# Patient Record
Sex: Male | Born: 1975 | Race: White | Hispanic: No | Marital: Single | Smoking: Current every day smoker
Health system: Southern US, Community
[De-identification: ages and names within clinical notes are randomized; demographics above are authoritative.]

## PROBLEM LIST (undated history)

## (undated) DIAGNOSIS — Z992 Dependence on renal dialysis: Secondary | ICD-10-CM

## (undated) DIAGNOSIS — J449 Chronic obstructive pulmonary disease, unspecified: Secondary | ICD-10-CM

## (undated) DIAGNOSIS — E119 Type 2 diabetes mellitus without complications: Secondary | ICD-10-CM

## (undated) DIAGNOSIS — E7800 Pure hypercholesterolemia, unspecified: Secondary | ICD-10-CM

## (undated) DIAGNOSIS — I10 Essential (primary) hypertension: Secondary | ICD-10-CM

## (undated) DIAGNOSIS — N289 Disorder of kidney and ureter, unspecified: Secondary | ICD-10-CM

## (undated) DIAGNOSIS — G629 Polyneuropathy, unspecified: Secondary | ICD-10-CM

## (undated) HISTORY — PX: CHOLECYSTECTOMY: SHX55

## (undated) HISTORY — PX: NECK SURGERY: SHX720

## (undated) HISTORY — PX: BACK SURGERY: SHX140

---

## 2015-12-22 ENCOUNTER — Inpatient Hospital Stay
Admission: EM | Admit: 2015-12-22 | Discharge: 2015-12-24 | DRG: 189 | Disposition: A | Discharge status: Home / Self Care | Payer: Medicare Other | Attending: Internal Medicine | Admitting: Internal Medicine

## 2015-12-22 ENCOUNTER — Emergency Department: Payer: Medicare Other

## 2015-12-22 ENCOUNTER — Encounter: Payer: Self-pay | Admitting: Emergency Medicine

## 2015-12-22 DIAGNOSIS — I120 Hypertensive chronic kidney disease with stage 5 chronic kidney disease or end stage renal disease: Secondary | ICD-10-CM | POA: Diagnosis present

## 2015-12-22 DIAGNOSIS — E1165 Type 2 diabetes mellitus with hyperglycemia: Secondary | ICD-10-CM | POA: Diagnosis present

## 2015-12-22 DIAGNOSIS — F17200 Nicotine dependence, unspecified, uncomplicated: Secondary | ICD-10-CM | POA: Diagnosis present

## 2015-12-22 DIAGNOSIS — J9601 Acute respiratory failure with hypoxia: Secondary | ICD-10-CM | POA: Diagnosis present

## 2015-12-22 DIAGNOSIS — E1140 Type 2 diabetes mellitus with diabetic neuropathy, unspecified: Secondary | ICD-10-CM | POA: Diagnosis present

## 2015-12-22 DIAGNOSIS — E1122 Type 2 diabetes mellitus with diabetic chronic kidney disease: Secondary | ICD-10-CM | POA: Diagnosis present

## 2015-12-22 DIAGNOSIS — E7800 Pure hypercholesterolemia, unspecified: Secondary | ICD-10-CM | POA: Diagnosis present

## 2015-12-22 DIAGNOSIS — G35 Multiple sclerosis: Secondary | ICD-10-CM | POA: Diagnosis present

## 2015-12-22 DIAGNOSIS — A419 Sepsis, unspecified organism: Secondary | ICD-10-CM | POA: Diagnosis present

## 2015-12-22 DIAGNOSIS — I10 Essential (primary) hypertension: Secondary | ICD-10-CM | POA: Diagnosis present

## 2015-12-22 DIAGNOSIS — E119 Type 2 diabetes mellitus without complications: Secondary | ICD-10-CM

## 2015-12-22 DIAGNOSIS — Z992 Dependence on renal dialysis: Secondary | ICD-10-CM | POA: Diagnosis not present

## 2015-12-22 DIAGNOSIS — J810 Acute pulmonary edema: Secondary | ICD-10-CM | POA: Diagnosis present

## 2015-12-22 DIAGNOSIS — Z79899 Other long term (current) drug therapy: Secondary | ICD-10-CM | POA: Diagnosis not present

## 2015-12-22 DIAGNOSIS — N186 End stage renal disease: Secondary | ICD-10-CM | POA: Diagnosis present

## 2015-12-22 DIAGNOSIS — D631 Anemia in chronic kidney disease: Secondary | ICD-10-CM | POA: Diagnosis present

## 2015-12-22 DIAGNOSIS — E785 Hyperlipidemia, unspecified: Secondary | ICD-10-CM | POA: Diagnosis present

## 2015-12-22 DIAGNOSIS — N2581 Secondary hyperparathyroidism of renal origin: Secondary | ICD-10-CM | POA: Diagnosis present

## 2015-12-22 DIAGNOSIS — J189 Pneumonia, unspecified organism: Secondary | ICD-10-CM

## 2015-12-22 DIAGNOSIS — J449 Chronic obstructive pulmonary disease, unspecified: Secondary | ICD-10-CM | POA: Diagnosis present

## 2015-12-22 DIAGNOSIS — J811 Chronic pulmonary edema: Secondary | ICD-10-CM

## 2015-12-22 HISTORY — DX: Chronic obstructive pulmonary disease, unspecified: J44.9

## 2015-12-22 HISTORY — DX: Essential (primary) hypertension: I10

## 2015-12-22 HISTORY — DX: Disorder of kidney and ureter, unspecified: N28.9

## 2015-12-22 HISTORY — DX: Dependence on renal dialysis: Z99.2

## 2015-12-22 HISTORY — DX: Pure hypercholesterolemia, unspecified: E78.00

## 2015-12-22 HISTORY — DX: Type 2 diabetes mellitus without complications: E11.9

## 2015-12-22 HISTORY — DX: Polyneuropathy, unspecified: G62.9

## 2015-12-22 LAB — CBC WITH DIFFERENTIAL/PLATELET
BASOS ABS: 0 10*3/uL (ref 0–0.1)
Basophils Relative: 0 %
EOS ABS: 0 10*3/uL (ref 0–0.7)
HCT: 40.8 % (ref 40.0–52.0)
Hemoglobin: 12.9 g/dL — ABNORMAL LOW (ref 13.0–18.0)
LYMPHS ABS: 0.4 10*3/uL — AB (ref 1.0–3.6)
Lymphocytes Relative: 4 %
MCH: 28 pg (ref 26.0–34.0)
MCHC: 31.7 g/dL — ABNORMAL LOW (ref 32.0–36.0)
MCV: 88.5 fL (ref 80.0–100.0)
MONO ABS: 1.1 10*3/uL — AB (ref 0.2–1.0)
Monocytes Relative: 9 %
Neutro Abs: 10.6 10*3/uL — ABNORMAL HIGH (ref 1.4–6.5)
Neutrophils Relative %: 87 %
PLATELETS: 161 10*3/uL (ref 150–440)
RBC: 4.6 MIL/uL (ref 4.40–5.90)
RDW: 16.9 % — AB (ref 11.5–14.5)
WBC: 12.2 10*3/uL — AB (ref 3.8–10.6)

## 2015-12-22 LAB — BRAIN NATRIURETIC PEPTIDE: B Natriuretic Peptide: 2810 pg/mL — ABNORMAL HIGH (ref 0.0–100.0)

## 2015-12-22 LAB — COMPREHENSIVE METABOLIC PANEL
ALT: 15 U/L — AB (ref 17–63)
ANION GAP: 13 (ref 5–15)
AST: 30 U/L (ref 15–41)
Albumin: 3.4 g/dL — ABNORMAL LOW (ref 3.5–5.0)
Alkaline Phosphatase: 263 U/L — ABNORMAL HIGH (ref 38–126)
BUN: 50 mg/dL — ABNORMAL HIGH (ref 6–20)
CHLORIDE: 102 mmol/L (ref 101–111)
CO2: 23 mmol/L (ref 22–32)
Calcium: 8.2 mg/dL — ABNORMAL LOW (ref 8.9–10.3)
Creatinine, Ser: 5.38 mg/dL — ABNORMAL HIGH (ref 0.61–1.24)
GFR calc Af Amer: 14 mL/min — ABNORMAL LOW (ref >60)
GFR calc non Af Amer: 12 mL/min — ABNORMAL LOW (ref >60)
GLUCOSE: 248 mg/dL — AB (ref 65–99)
POTASSIUM: 5.3 mmol/L — AB (ref 3.5–5.1)
SODIUM: 138 mmol/L (ref 135–145)
Total Bilirubin: 1.5 mg/dL — ABNORMAL HIGH (ref 0.3–1.2)
Total Protein: 7.5 g/dL (ref 6.5–8.1)

## 2015-12-22 LAB — TROPONIN I: Troponin I: 0.07 ng/mL — ABNORMAL HIGH (ref <0.031)

## 2015-12-22 LAB — LACTIC ACID, PLASMA: LACTIC ACID, VENOUS: 1.5 mmol/L (ref 0.5–2.0)

## 2015-12-22 LAB — GLUCOSE, CAPILLARY: GLUCOSE-CAPILLARY: 296 mg/dL — AB (ref 65–99)

## 2015-12-22 MED ORDER — SODIUM CHLORIDE 0.9% FLUSH
3.0000 mL | Freq: Two times a day (BID) | INTRAVENOUS | Status: DC
  Administered 2015-12-23 – 2015-12-24 (×3): 3 mL via INTRAVENOUS

## 2015-12-22 MED ORDER — DULOXETINE HCL 30 MG PO CPEP
30.0000 mg | ORAL_CAPSULE | Freq: Every day | ORAL | Status: DC
  Administered 2015-12-23 – 2015-12-24 (×2): 30 mg via ORAL
  Filled 2015-12-22 (×2): qty 1

## 2015-12-22 MED ORDER — ACETAMINOPHEN 325 MG PO TABS
650.0000 mg | ORAL_TABLET | Freq: Four times a day (QID) | ORAL | Status: DC | PRN
Start: 2015-12-22 — End: 2015-12-24
  Administered 2015-12-23: 650 mg via ORAL
  Filled 2015-12-22: qty 2

## 2015-12-22 MED ORDER — FUROSEMIDE 10 MG/ML IJ SOLN
80.0000 mg | Freq: Once | INTRAMUSCULAR | Status: AC
  Administered 2015-12-22: 80 mg via INTRAVENOUS
  Filled 2015-12-22: qty 8

## 2015-12-22 MED ORDER — FUROSEMIDE 40 MG PO TABS
80.0000 mg | ORAL_TABLET | Freq: Two times a day (BID) | ORAL | Status: DC
  Administered 2015-12-23 – 2015-12-24 (×3): 80 mg via ORAL
  Filled 2015-12-22 (×3): qty 2

## 2015-12-22 MED ORDER — ACETAMINOPHEN 650 MG RE SUPP: 650.0000 mg | Freq: Four times a day (QID) | RECTAL | Status: DC | PRN

## 2015-12-22 MED ORDER — LISINOPRIL 20 MG PO TABS
20.0000 mg | ORAL_TABLET | Freq: Every evening | ORAL | Status: DC
  Administered 2015-12-23: 20 mg via ORAL
  Filled 2015-12-22: qty 1

## 2015-12-22 MED ORDER — CARVEDILOL 25 MG PO TABS
25.0000 mg | ORAL_TABLET | Freq: Two times a day (BID) | ORAL | Status: DC
  Administered 2015-12-23 – 2015-12-24 (×3): 25 mg via ORAL
  Filled 2015-12-22 (×3): qty 1

## 2015-12-22 MED ORDER — ATORVASTATIN CALCIUM 20 MG PO TABS
40.0000 mg | ORAL_TABLET | Freq: Every evening | ORAL | Status: DC
  Administered 2015-12-23: 40 mg via ORAL
  Filled 2015-12-22: qty 2

## 2015-12-22 MED ORDER — INSULIN ASPART 100 UNIT/ML ~~LOC~~ SOLN
0.0000 [IU] | Freq: Three times a day (TID) | SUBCUTANEOUS | Status: DC
  Administered 2015-12-23: 3 [IU] via SUBCUTANEOUS
  Administered 2015-12-23 (×2): 5 [IU] via SUBCUTANEOUS
  Administered 2015-12-24: 1 [IU] via SUBCUTANEOUS
  Filled 2015-12-22: qty 5
  Filled 2015-12-22: qty 3
  Filled 2015-12-22: qty 5
  Filled 2015-12-22: qty 1

## 2015-12-22 MED ORDER — ONDANSETRON HCL 4 MG PO TABS: 4.0000 mg | ORAL_TABLET | Freq: Four times a day (QID) | ORAL | Status: DC | PRN

## 2015-12-22 MED ORDER — HEPARIN SODIUM (PORCINE) 5000 UNIT/ML IJ SOLN
5000.0000 [IU] | Freq: Three times a day (TID) | INTRAMUSCULAR | Status: DC
  Administered 2015-12-22 – 2015-12-24 (×5): 5000 [IU] via SUBCUTANEOUS
  Filled 2015-12-22 (×5): qty 1

## 2015-12-22 MED ORDER — NITROGLYCERIN IN D5W 200-5 MCG/ML-% IV SOLN
0.0000 ug/min | INTRAVENOUS | Status: DC
  Administered 2015-12-22 – 2015-12-23 (×2): 80 ug/min via INTRAVENOUS
  Filled 2015-12-22: qty 250

## 2015-12-22 MED ORDER — AMLODIPINE BESYLATE 5 MG PO TABS
5.0000 mg | ORAL_TABLET | Freq: Every day | ORAL | Status: DC
  Administered 2015-12-23 – 2015-12-24 (×2): 5 mg via ORAL
  Filled 2015-12-22 (×2): qty 1

## 2015-12-22 MED ORDER — IPRATROPIUM-ALBUTEROL 0.5-2.5 (3) MG/3ML IN SOLN: 3.0000 mL | RESPIRATORY_TRACT | Status: DC | PRN

## 2015-12-22 MED ORDER — INSULIN ASPART 100 UNIT/ML ~~LOC~~ SOLN
0.0000 [IU] | Freq: Every day | SUBCUTANEOUS | Status: DC
  Administered 2015-12-22: 3 [IU] via SUBCUTANEOUS
  Filled 2015-12-22: qty 4
  Filled 2015-12-22: qty 3

## 2015-12-22 MED ORDER — VANCOMYCIN HCL IN DEXTROSE 1-5 GM/200ML-% IV SOLN
1000.0000 mg | Freq: Once | INTRAVENOUS | Status: AC
  Administered 2015-12-22: 1000 mg via INTRAVENOUS
  Filled 2015-12-22: qty 200

## 2015-12-22 MED ORDER — DEXTROSE 5 % IV SOLN
2.0000 g | Freq: Once | INTRAVENOUS | Status: AC
  Administered 2015-12-22: 2 g via INTRAVENOUS
  Filled 2015-12-22: qty 2

## 2015-12-22 MED ORDER — ONDANSETRON HCL 4 MG/2ML IJ SOLN
4.0000 mg | Freq: Four times a day (QID) | INTRAMUSCULAR | Status: DC | PRN
  Administered 2015-12-23: 4 mg via INTRAVENOUS
  Filled 2015-12-22: qty 2

## 2015-12-22 NOTE — ED Provider Notes (Signed)
[facility] Emergency Department Provider Note   ____________________________________________  Time seen: Seen upon arrival to the emergency department  I have reviewed the triage vital signs and the nursing notes.   HISTORY  Chief Complaint Respiratory Distress    HPI Tanner Campbell is a 40 y.o. male with a history of COPD as well as end-stage renal disease on dialysis who is presenting to the emergency department with acute onset shortness of breath. Just prior to arrival the patient became acutely short of breath. Denies any chest pain. Denies any cough or fever. En route was put on albuterol nebulization. No further interventions in route. Found to be in the mid to high 80s on room air. Does not wear oxygen at his baseline. Does smoke cigarettes. Last dialyzed yesterday.Patient does make urine.   Past Medical History  Diagnosis Date  . Diabetes mellitus without complication (HCC)   . Hypertension   . COPD (chronic obstructive pulmonary disease) (HCC)   . Renal disorder   . Neuropathy (HCC)   . Hemodialysis patient (HCC)   . High cholesterol     There are no active problems to display for this patient.   Past Surgical History  Procedure Laterality Date  . Cholecystectomy    . Back surgery    . Neck surgery      Current Outpatient Rx  Name  Route  Sig  Dispense  Refill  . albuterol (PROVENTIL HFA;VENTOLIN HFA) 108 (90 Base) MCG/ACT inhaler   Inhalation   Inhale 1-2 puffs into the lungs every 6 (six) hours as needed for wheezing or shortness of breath.         Marland Kitchen amLODipine (NORVASC) 5 MG tablet   Oral   Take 5 mg by mouth daily.         Marland Kitchen atorvastatin (LIPITOR) 40 MG tablet   Oral   Take 40 mg by mouth every evening.         . calcium acetate (PHOSLO) 667 MG capsule   Oral   Take 2,001 mg by mouth 3 (three) times daily with meals.         . carvedilol (COREG) 25 MG tablet   Oral   Take 25 mg by mouth 2 (two) times daily  with a meal.         . DULoxetine (CYMBALTA) 30 MG capsule   Oral   Take 30 mg by mouth daily.         . Ferric Citrate (AURYXIA PO)   Oral   Take by mouth.         . folic acid-vitamin b complex-vitamin c-selenium-zinc (DIALYVITE) 3 MG TABS tablet   Oral   Take 1 tablet by mouth daily.         . furosemide (LASIX) 80 MG tablet   Oral   Take 80 mg by mouth 2 (two) times daily.         Marland Kitchen gabapentin (NEURONTIN) 300 MG capsule   Oral   Take 300 mg by mouth at bedtime.         Marland Kitchen lisinopril (PRINIVIL,ZESTRIL) 20 MG tablet   Oral   Take 20 mg by mouth every evening.         . risperiDONE (RISPERDAL) 0.5 MG tablet   Oral   Take 0.5 mg by mouth at bedtime.           Allergies Penicillins  History reviewed. No pertinent family history.  Social History Social History  Substance  Use Topics  . Smoking status: Current Every Day Smoker  . Smokeless tobacco: None  . Alcohol Use: No    Review of Systems Constitutional: No fever/chills Eyes: No visual changes. ENT: No sore throat. Cardiovascular: Denies chest pain. Respiratory: As above Gastrointestinal: No abdominal pain.  No nausea, no vomiting.  No diarrhea.  No constipation. Genitourinary: Negative for dysuria. Musculoskeletal: Negative for back pain. Skin: Negative for rash. Neurological: Negative for headaches, focal weakness or numbness.  10-point ROS otherwise negative.  ____________________________________________   PHYSICAL EXAM:  VITAL SIGNS: ED Triage Vitals  Enc Vitals Group     BP 12/22/15 2053 183/100 mmHg     Pulse Rate 12/22/15 2050 122     Resp 12/22/15 2053 30     Temp 12/22/15 2123 97.9 F (36.6 C)     Temp Source 12/22/15 2053 Axillary     SpO2 12/22/15 2050 100 %     Weight 12/22/15 2053 168 lb (76.204 kg)     Height 12/22/15 2053  (1.651 m)     Head Cir --      Peak Flow --      Pain Score --      Pain Loc --      Pain Edu? --      Excl. in GC? --      Constitutional: Somnolent but are easily arousable. Eyes: Conjunctivae are normal. PERRL. EOMI. Head: Atraumatic. Nose: No congestion/rhinnorhea. Mouth/Throat: Mucous membranes are moist.  Oropharynx non-erythematous. Neck: No stridor.   Cardiovascular: Tachycardic, regular rhythm. Grossly normal heart sounds.  JVD is present. Respiratory: Wheezing throughout with prolonged history phase. Moderate respiratory distress with supraclavicular retractions. Gastrointestinal: Soft and nontender. No distention.  Musculoskeletal: No lower extremity tenderness nor edema.  No joint effusions. Neurologic:  Normal speech and language. No gross focal neurologic deficits are appreciated.  Skin:  Skin is warm, dry and intact. No rash noted. Psychiatric: Mood and affect are normal. Speech and behavior are normal.  ____________________________________________   LABS (all labs ordered are listed, but only abnormal results are displayed)  Labs Reviewed  CBC WITH DIFFERENTIAL/PLATELET - Abnormal; Notable for the following:    WBC 12.2 (*)    Hemoglobin 12.9 (*)    MCHC 31.7 (*)    RDW 16.9 (*)    Neutro Abs 10.6 (*)    Lymphs Abs 0.4 (*)    Monocytes Absolute 1.1 (*)    All other components within normal limits  COMPREHENSIVE METABOLIC PANEL - Abnormal; Notable for the following:    Potassium 5.3 (*)    Glucose, Bld 248 (*)    BUN 50 (*)    Creatinine, Ser 5.38 (*)    Calcium 8.2 (*)    Albumin 3.4 (*)    ALT 15 (*)    Alkaline Phosphatase 263 (*)    Total Bilirubin 1.5 (*)    GFR calc non Af Amer 12 (*)    GFR calc Af Amer 14 (*)    All other components within normal limits  TROPONIN I - Abnormal; Notable for the following:    Troponin I 0.07 (*)    All other components within normal limits  CULTURE, BLOOD (ROUTINE X 2)  CULTURE, BLOOD (ROUTINE X 2)  URINE CULTURE  BRAIN NATRIURETIC PEPTIDE  LACTIC ACID, PLASMA  LACTIC ACID, PLASMA  URINALYSIS COMPLETEWITH MICROSCOPIC ([facility]  ONLY)   ____________________________________________  EKG  ED ECG REPORT I, Arelia Longest, the attending physician, personally viewed and interpreted this  ECG.   Date: 12/22/2015  EKG Time: 2054  Rate: 120  Rhythm: sinus tachycardia  Axis: Right axis deviation  Intervals:none  ST&T Change: T wave inversions in V5 and V6 with 1 mm depression in V5 with a half millimeter depression in V6. Also with T wave inversion in 3 and aVF. No previous EKGs for comparison.  ____________________________________________  RADIOLOGY  DG Chest 1 View (Final result) Result time: 12/22/15 21:28:54   Final result by Rad Results In Interface (12/22/15 21:28:54)   Narrative:   CLINICAL DATA: Respiratory distress  EXAM: CHEST 1 VIEW  COMPARISON: None.  FINDINGS: Bilateral pleural effusions with underlying opacity are identified. The heart, hila, and mediastinum are unremarkable. No pneumothorax.  IMPRESSION: 1. Bilateral pleural effusions with underlying opacities. The underlying opacities could represent atelectasis but infiltrate is not excluded. Recommend follow-up to resolution.   Electronically Signed By: Gerome Sam III M.D On: 12/22/2015 21:28          ____________________________________________   PROCEDURES  CRITICAL CARE Performed by: Arelia Longest   Total critical care time: 35 minutes  Critical care time was exclusive of separately billable procedures and treating other patients.  Critical care was necessary to treat or prevent imminent or life-threatening deterioration.  Critical care was time spent personally by me on the following activities: development of treatment plan with patient and/or surrogate as well as nursing, discussions with consultants, evaluation of patient's response to treatment, examination of patient, obtaining history from patient or surrogate, ordering and performing treatments and interventions, ordering and review  of laboratory studies, ordering and review of radiographic studies, pulse oximetry and re-evaluation of patient's condition.   ____________________________________________   INITIAL IMPRESSION / ASSESSMENT AND PLAN / ED COURSE  Pertinent labs & imaging results that were available during my care of the patient were reviewed by me and considered in my medical decision making (see chart for details).  ----------------------------------------- 9:57 PM on 12/22/2015 -----------------------------------------  Patient placed on BiPAP and is tolerating it well. Also placed on nitro drip. Found to have bilateral effusions with possible infiltrate. We'll start with antibiotics. His sisters at the bedside and says he has been delirious over the past few days. Cannot rule out pneumonia as a cause for this delirium. However, the acute onset of the shortness of breath as well as his hypertension is more consistent with flash pulmonary edema.  ----------------------------------------- 10:34 PM on 12/22/2015 -----------------------------------------  Discussed case with Dr. Tora Duck who will evaluate the patient for dialysis. Signed out to Dr. Anne Hahn of the medicine service. Patient awake and alert and tolerating BiPAP well. Sepsis protocol initiated. ____________________________________________   FINAL CLINICAL IMPRESSION(S) / ED DIAGNOSES  Final diagnoses:  Flash pulmonary edema (HCC)  HCAP    NEW MEDICATIONS STARTED DURING THIS VISIT:  New Prescriptions   No medications on file     Note:  This document was prepared using Dragon voice recognition software and may include unintentional dictation errors.    Myrna Blazer, MD 12/22/15 [phone]

## 2015-12-22 NOTE — ED Notes (Signed)
Dr. Pershing Proud notified regarding troponin of 0.07. No new orders recieved

## 2015-12-22 NOTE — H&P (Signed)
[facility] -  at [facility]   PATIENT NAME: Tanner Campbell    MR#:  [id number]  DATE OF BIRTH:  12/07/1975  DATE OF ADMISSION:  12/22/2015  PRIMARY CARE PHYSICIAN: No primary care provider on file.   REQUESTING/REFERRING PHYSICIAN: Pershing Proud, MD  CHIEF COMPLAINT:   Chief Complaint  Patient presents with  . Respiratory Distress    HISTORY OF PRESENT ILLNESS:  Tanner Campbell  is a 40 y.o. male who presents with Acute onset shortness of breath at home. Patient is an end-stage renal disease patient on dialysis, and states that he was at home going about his usual activity when he became acutely dyspneic. Here in the ED he was found to have significant pulmonary edema on his chest x-ray. Some question of possible underlying pneumonia. He did also have a leukocytosis on labs with mild bandemia on differential. Patient was initially placed on BiPAP with good improvement in his O2 sats, and work of breathing. His blood pressure was significantly elevated to, and he was put on nitro drip with good result. Nephrology was contacted for the possibility of dialysis. Patient denies any recent infectious symptoms.  Hospitalists were called for admission.  PAST MEDICAL HISTORY:   Past Medical History  Diagnosis Date  . Diabetes mellitus without complication (HCC)   . Hypertension   . COPD (chronic obstructive pulmonary disease) (HCC)   . Renal disorder   . Neuropathy (HCC)   . Hemodialysis patient (HCC)   . High cholesterol     PAST SURGICAL HISTORY:   Past Surgical History  Procedure Laterality Date  . Cholecystectomy    . Back surgery    . Neck surgery      SOCIAL HISTORY:   Social History  Substance Use Topics  . Smoking status: Current Every Day Smoker  . Smokeless tobacco: Not on file  . Alcohol Use: No    FAMILY HISTORY:  History reviewed. No pertinent family history.  DRUG ALLERGIES:   Allergies  Allergen Reactions  . Penicillins  Other (See Comments)    Reaction: Unknown    MEDICATIONS AT HOME:   Prior to Admission medications   Medication Sig Start Date End Date Taking? Authorizing Provider  albuterol (PROVENTIL HFA;VENTOLIN HFA) 108 (90 Base) MCG/ACT inhaler Inhale 1-2 puffs into the lungs every 6 (six) hours as needed for wheezing or shortness of breath.   Yes Historical Provider, MD  amLODipine (NORVASC) 5 MG tablet Take 5 mg by mouth daily.   Yes Historical Provider, MD  atorvastatin (LIPITOR) 40 MG tablet Take 40 mg by mouth every evening.   Yes Historical Provider, MD  calcium acetate (PHOSLO) 667 MG capsule Take 2,001 mg by mouth 3 (three) times daily with meals.   Yes Historical Provider, MD  carvedilol (COREG) 25 MG tablet Take 25 mg by mouth 2 (two) times daily with a meal.   Yes Historical Provider, MD  DULoxetine (CYMBALTA) 30 MG capsule Take 30 mg by mouth daily.   Yes Historical Provider, MD  Ferric Citrate (AURYXIA PO) Take by mouth.   Yes Historical Provider, MD  folic acid-vitamin b complex-vitamin c-selenium-zinc (DIALYVITE) 3 MG TABS tablet Take 1 tablet by mouth daily.   Yes Historical Provider, MD  furosemide (LASIX) 80 MG tablet Take 80 mg by mouth 2 (two) times daily.   Yes Historical Provider, MD  gabapentin (NEURONTIN) 300 MG capsule Take 300 mg by mouth at bedtime.   Yes Historical Provider, MD  lisinopril (PRINIVIL,ZESTRIL) 20 MG tablet  Take 20 mg by mouth every evening.   Yes Historical Provider, MD  risperiDONE (RISPERDAL) 0.5 MG tablet Take 0.5 mg by mouth at bedtime.   Yes Historical Provider, MD    REVIEW OF SYSTEMS:  Review of Systems  Constitutional: Negative for fever, chills, weight loss and malaise/fatigue.  HENT: Negative for ear pain, hearing loss and tinnitus.   Eyes: Negative for blurred vision, double vision, pain and redness.  Respiratory: Positive for shortness of breath. Negative for cough and hemoptysis.   Cardiovascular: Negative for chest pain, palpitations,  orthopnea and leg swelling.  Gastrointestinal: Negative for nausea, vomiting, abdominal pain, diarrhea and constipation.  Genitourinary: Negative for dysuria, frequency and hematuria.  Musculoskeletal: Negative for back pain, joint pain and neck pain.  Skin:       No acne, rash, or lesions  Neurological: Negative for dizziness, tremors, focal weakness and weakness.  Endo/Heme/Allergies: Negative for polydipsia. Does not bruise/bleed easily.  Psychiatric/Behavioral: Negative for depression. The patient is not nervous/anxious and does not have insomnia.      VITAL SIGNS:   Filed Vitals:   12/22/15 2210 12/22/15 2215 12/22/15 2220 12/22/15 2225  BP:    149/81  Pulse: 110 110 109 108  Temp:      TempSrc:      Resp: Height:      Weight:      SpO2: 100% 100% 100% 98%   Wt Readings from Last 3 Encounters:  12/22/15 76.204 kg (168 lb)    PHYSICAL EXAMINATION:  Physical Exam  Vitals reviewed. Constitutional: He is oriented to person, place, and time. He appears well-developed and well-nourished. No distress.  HENT:  Head: Normocephalic and atraumatic.  Mouth/Throat: Oropharynx is clear and moist.  Eyes: Conjunctivae and EOM are normal. Pupils are equal, round, and reactive to light. No scleral icterus.  Neck: Normal range of motion. Neck supple. No JVD present. No thyromegaly present.  Cardiovascular: Regular rhythm and intact distal pulses.  Exam reveals no gallop and no friction rub.   No murmur heard. Tachycardic  Respiratory: Effort normal. No respiratory distress. He has no wheezes. He has rales.  GI: Soft. Bowel sounds are normal. He exhibits no distension. There is no tenderness.  Musculoskeletal: Normal range of motion. He exhibits no edema.  No arthritis, no gout  Lymphadenopathy:    He has no cervical adenopathy.  Neurological: He is alert and oriented to person, place, and time. No cranial nerve deficit.  No dysarthria, no aphasia  Skin: Skin is warm and  dry. No rash noted. No erythema.  Psychiatric: He has a normal mood and affect. His behavior is normal. Judgment and thought content normal.    LABORATORY PANEL:   CBC  Recent Labs Lab 12/22/15 2101  WBC 12.2*  HGB 12.9*  HCT 40.8  PLT 161   ------------------------------------------------------------------------------------------------------------------  Chemistries   Recent Labs Lab 12/22/15 2101  NA 138  K 5.3*  CL 102  CO2 23  GLUCOSE 248*  BUN 50*  CREATININE 5.38*  CALCIUM 8.2*  AST 30  ALT 15*  ALKPHOS 263*  BILITOT 1.5*   ------------------------------------------------------------------------------------------------------------------  Cardiac Enzymes  Recent Labs Lab 12/22/15 2101  TROPONINI 0.07*   ------------------------------------------------------------------------------------------------------------------  RADIOLOGY:  Dg Chest 1 View  12/22/2015  CLINICAL DATA:  Respiratory distress EXAM: CHEST 1 VIEW COMPARISON:  None. FINDINGS: Bilateral pleural effusions with underlying opacity are identified. The heart, hila, and mediastinum are unremarkable. No pneumothorax. IMPRESSION: 1. Bilateral pleural effusions with underlying  opacities. The underlying opacities could represent atelectasis but infiltrate is not excluded. Recommend follow-up to resolution. Electronically Signed   By: Gerome Sam III M.D   On: 12/22/2015 21:28    EKG:  No orders found for this or any previous visit.  IMPRESSION AND PLAN:  Principal Problem:   Sepsis (HCC) - leukocytosis with respiratory distress and tachypnea. She got with IV antibiotics in the ED, we will continue these on admission. Patient was pancultured except for sputum culture which we will add. hemodynamically stable. Active Problems:   Accelerated hypertension - chronic IV nitroglycerin in the ED with good response in his blood pressure. Continue this on admission and wean as tolerated.   Flash  pulmonary edema (HCC) - patient placed on BiPAP with good results and is breathing status, also given some IV Lasix as he still makes some urine. Nephrology consulted for possibility of dialysis.   ESRD on dialysis [facility]) - nephrology consult as above, avoid nephrotoxins, renally dose medications   Type 2 diabetes mellitus (HCC) - sliding scale insulin with corresponding glucose checks and carb modified diet   COPD (chronic obstructive pulmonary disease) (HCC) - continue home inhalers and other breathing support as above.  All the records are reviewed and case discussed with ED provider. Management plans discussed with the patient and/or family.  DVT PROPHYLAXIS: SubQ heparin  GI PROPHYLAXIS: None  ADMISSION STATUS: Inpatient  CODE STATUS: Full Code Status History    This patient does not have a recorded code status. Please follow your organizational policy for patients in this situation.      TOTAL TIME TAKING CARE OF THIS PATIENT: 45 minutes.    Shina Wass FIELDING 12/22/2015, 10:51 PM  [facility]  Office  [phone]  CC: Primary care physician; No primary care provider on file.

## 2015-12-22 NOTE — ED Notes (Signed)
Pt assisted up to bedside commode for bowel movement with rn and tech assist. Pt refuses to use bedpan.

## 2015-12-22 NOTE — ED Notes (Signed)
Pt with improved resp rate. Pt able to speak in full complete sentences through bipap mask. Family now at bedside. Call bell at right side.

## 2015-12-22 NOTE — ED Notes (Signed)
Sister: crystal ciani [phone]

## 2015-12-22 NOTE — ED Notes (Signed)
Pt resting, arouses to verbal stimuli, reports improved shob and denies pain.

## 2015-12-22 NOTE — ED Notes (Signed)
Pt in resp distress from home that started suddenly per ems. Pt with sallow skin color, resps labored. Pt received duonbeb x2 and solumedrol  iv by ems.

## 2015-12-22 NOTE — ED Notes (Signed)
Pt reports improved shob.  

## 2015-12-23 ENCOUNTER — Inpatient Hospital Stay: Admit: 2015-12-23 | Payer: Medicare Other

## 2015-12-23 LAB — BASIC METABOLIC PANEL
ANION GAP: 11 (ref 5–15)
BUN: 55 mg/dL — ABNORMAL HIGH (ref 6–20)
CALCIUM: 7.9 mg/dL — AB (ref 8.9–10.3)
CO2: 25 mmol/L (ref 22–32)
CREATININE: 5.83 mg/dL — AB (ref 0.61–1.24)
Chloride: 101 mmol/L (ref 101–111)
GFR, EST AFRICAN AMERICAN: 13 mL/min — AB (ref >60)
GFR, EST NON AFRICAN AMERICAN: 11 mL/min — AB (ref >60)
Glucose, Bld: 370 mg/dL — ABNORMAL HIGH (ref 65–99)
Potassium: 4.1 mmol/L (ref 3.5–5.1)
SODIUM: 137 mmol/L (ref 135–145)

## 2015-12-23 LAB — GLUCOSE, CAPILLARY
GLUCOSE-CAPILLARY: 323 mg/dL — AB (ref 65–99)
Glucose-Capillary: 297 mg/dL — ABNORMAL HIGH (ref 65–99)
Glucose-Capillary: 279 mg/dL — ABNORMAL HIGH (ref 65–99)
Glucose-Capillary: 271 mg/dL — ABNORMAL HIGH (ref 65–99)
Glucose-Capillary: 223 mg/dL — ABNORMAL HIGH (ref 65–99)
Glucose-Capillary: 182 mg/dL — ABNORMAL HIGH (ref 65–99)

## 2015-12-23 LAB — MRSA PCR SCREENING: MRSA by PCR: NEGATIVE

## 2015-12-23 LAB — HEMOGLOBIN A1C: HEMOGLOBIN A1C: 7.6 % — AB (ref 4.0–6.0)

## 2015-12-23 LAB — CBC
HCT: 33.6 % — ABNORMAL LOW (ref 40.0–52.0)
HEMOGLOBIN: 10.4 g/dL — AB (ref 13.0–18.0)
MCH: 27.5 pg (ref 26.0–34.0)
MCHC: 31 g/dL — ABNORMAL LOW (ref 32.0–36.0)
MCV: 88.6 fL (ref 80.0–100.0)
PLATELETS: 140 10*3/uL — AB (ref 150–440)
RBC: 3.79 MIL/uL — AB (ref 4.40–5.90)
RDW: 17 % — ABNORMAL HIGH (ref 11.5–14.5)
WBC: 12.3 10*3/uL — AB (ref 3.8–10.6)

## 2015-12-23 LAB — TROPONIN I
TROPONIN I: 0.15 ng/mL — AB (ref <0.031)
TROPONIN I: 0.15 ng/mL — AB (ref <0.031)
TROPONIN I: 0.14 ng/mL — AB (ref <0.031)

## 2015-12-23 MED ORDER — INSULIN ASPART 100 UNIT/ML ~~LOC~~ SOLN
4.0000 [IU] | Freq: Once | SUBCUTANEOUS | Status: AC
  Administered 2015-12-23: 4 [IU] via SUBCUTANEOUS

## 2015-12-23 MED ORDER — MUPIROCIN 2 % EX OINT
1.0000 "application " | TOPICAL_OINTMENT | Freq: Two times a day (BID) | CUTANEOUS | Status: DC
  Administered 2015-12-23: 1 via NASAL
  Filled 2015-12-23: qty 22

## 2015-12-23 MED ORDER — CETYLPYRIDINIUM CHLORIDE 0.05 % MT LIQD
7.0000 mL | Freq: Two times a day (BID) | OROMUCOSAL | Status: DC
  Administered 2015-12-23 – 2015-12-24 (×3): 7 mL via OROMUCOSAL

## 2015-12-23 MED ORDER — CHLORHEXIDINE GLUCONATE CLOTH 2 % EX PADS
6.0000 | MEDICATED_PAD | Freq: Every day | CUTANEOUS | Status: DC
  Administered 2015-12-23: 6 via TOPICAL

## 2015-12-23 MED ORDER — ALUM & MAG HYDROXIDE-SIMETH 200-200-20 MG/5ML PO SUSP
15.0000 mL | ORAL | Status: DC | PRN
  Administered 2015-12-23: 15 mL via ORAL
  Filled 2015-12-23: qty 30

## 2015-12-23 MED ORDER — DEXTROSE 5 % IV SOLN
500.0000 mg | Freq: Three times a day (TID) | INTRAVENOUS | Status: DC
  Administered 2015-12-23 – 2015-12-24 (×4): 500 mg via INTRAVENOUS
  Filled 2015-12-23 (×6): qty 0.5

## 2015-12-23 MED ORDER — CALCIUM ACETATE (PHOS BINDER) 667 MG PO CAPS
2001.0000 mg | ORAL_CAPSULE | Freq: Three times a day (TID) | ORAL | Status: DC
  Administered 2015-12-23 – 2015-12-24 (×3): 2001 mg via ORAL
  Filled 2015-12-23 (×3): qty 3

## 2015-12-23 MED ORDER — VANCOMYCIN HCL IN DEXTROSE 750-5 MG/150ML-% IV SOLN
750.0000 mg | INTRAVENOUS | Status: DC
  Administered 2015-12-23: 750 mg via INTRAVENOUS
  Filled 2015-12-23: qty 150

## 2015-12-23 MED ORDER — VANCOMYCIN HCL IN DEXTROSE 750-5 MG/150ML-% IV SOLN
750.0000 mg | Freq: Once | INTRAVENOUS | Status: AC
  Administered 2015-12-23: 750 mg via INTRAVENOUS
  Filled 2015-12-23: qty 150

## 2015-12-23 MED ORDER — CETYLPYRIDINIUM CHLORIDE 0.05 % MT LIQD
7.0000 mL | Freq: Two times a day (BID) | OROMUCOSAL | Status: DC
Start: 2015-12-23 — End: 2015-12-23

## 2015-12-23 MED ORDER — CHLORHEXIDINE GLUCONATE 0.12 % MT SOLN: 15.0000 mL | Freq: Two times a day (BID) | OROMUCOSAL | Status: DC

## 2015-12-23 NOTE — Progress Notes (Signed)
Pt sister called to inform nursing staff, pt has made multiple calls home insisting she come and take him home. She is refusing to do so.  Sister requesting doctor call her when able to. Dr.Kalisetti paged.  Will continue to assess.

## 2015-12-23 NOTE — ED Notes (Signed)
Pharmacy called again for vancomycin dose.

## 2015-12-23 NOTE — ED Notes (Signed)
Spoke with dr. Sheryle Hail regarding pt's hyperglycemia after sliding scale doseage at 2330. Dr. Sheryle Hail states "when he comes up to the floor we'll recheck him and redose him from his sliding scale."

## 2015-12-23 NOTE — Progress Notes (Signed)
[facility] - [place] at [facility]   PATIENT NAME: Tanner Campbell    MR#:  [id number]  DATE OF BIRTH:  [birth year]  SUBJECTIVE:  CHIEF COMPLAINT:   Chief Complaint  Patient presents with  . Respiratory Distress   -Dialysis patient, last dialysis on Thursday admitted with acute hypoxia and respiratory distress. -Chest x-ray with pulmonary edema. Due for dialysis today. -Still remains on nitro drip, blood pressure is much improved now. Also on BiPAP at 45% FiO2  REVIEW OF SYSTEMS:  Review of Systems  Constitutional: Negative for fever and chills.  HENT: Negative for ear discharge, ear pain and nosebleeds.   Eyes: Positive for blurred vision. Negative for double vision.  Respiratory: Positive for shortness of breath. Negative for cough and wheezing.   Cardiovascular: Negative for chest pain and palpitations.  Gastrointestinal: Negative for nausea, vomiting, abdominal pain, diarrhea and constipation.  Genitourinary: Negative for dysuria.  Musculoskeletal: Negative for myalgias.  Neurological: Positive for weakness. Negative for dizziness, sensory change, speech change, focal weakness, seizures and headaches.  Psychiatric/Behavioral: Negative for depression.    DRUG ALLERGIES:   Allergies  Allergen Reactions  . Penicillins Other (See Comments)    Reaction: Unknown    VITALS:  Blood pressure 119/59, pulse 95, temperature 98 F (36.7 C), temperature source Axillary, resp. rate 13, height 5\' 5"  (1.651 m), weight 73.8 kg (162 lb 11.2 oz), SpO2 97 %.  PHYSICAL EXAMINATION:  Physical Exam  GENERAL:  40 y.o.-year-old patient lying in the bed, sick -appearing. On BiPAP EYES: Pupils equal, round, reactive to light and accommodation. No scleral icterus. Extraocular muscles intact.  HEENT: Head atraumatic, normocephalic. Oropharynx and nasopharynx clear.  NECK:  Supple, no jugular venous distention. No thyroid enlargement, no tenderness.  LUNGS: Normal  breath sounds bilaterally, no wheezing, rhonchi or crepitation. Bibasilar crackles heard. No use of accessory muscles of respiration.  CARDIOVASCULAR: S1, S2 normal. No murmurs, rubs, or gallops.  ABDOMEN: Soft, nontender, nondistended. Bowel sounds present. No organomegaly or mass.  EXTREMITIES: No pedal edema, cyanosis, or clubbing.  NEUROLOGIC: Cranial nerves II through XII are intact. Muscle strength 5/5 in all extremities. Sensation intact. Gait not checked. Global weakness noted. PSYCHIATRIC: The patient is alert and oriented x 2-3.  SKIN: No obvious rash, lesion, or ulcer.    LABORATORY PANEL:   CBC  Recent Labs Lab 12/23/15 0424  WBC 12.3*  HGB 10.4*  HCT 33.6*  PLT 140*   ------------------------------------------------------------------------------------------------------------------  Chemistries   Recent Labs Lab 12/22/15 2101 12/23/15 0424  NA 138 137  K 5.3* 4.1  CL 102 101  CO2 23 25  GLUCOSE 248* 370*  BUN 50* 55*  CREATININE 5.38* 5.83*  CALCIUM 8.2* 7.9*  AST 30  --   ALT 15*  --   ALKPHOS 263*  --   BILITOT 1.5*  --    ------------------------------------------------------------------------------------------------------------------  Cardiac Enzymes  Recent Labs Lab 12/23/15 0424  TROPONINI 0.15*   ------------------------------------------------------------------------------------------------------------------  RADIOLOGY:  Dg Chest 1 View  12/22/2015  CLINICAL DATA:  Respiratory distress EXAM: CHEST 1 VIEW COMPARISON:  None. FINDINGS: Bilateral pleural effusions with underlying opacity are identified. The heart, hila, and mediastinum are unremarkable. No pneumothorax. IMPRESSION: 1. Bilateral pleural effusions with underlying opacities. The underlying opacities could represent atelectasis but infiltrate is not excluded. Recommend follow-up to resolution. Electronically Signed   By: Gerome Sam III M.D   On: 12/22/2015 21:28    EKG:  No  orders found for this or any previous visit.  ASSESSMENT AND PLAN:   40 year old male with past medical history significant for hypertension, diabetes, COPD and ESRD on hemodialysis presents with acute respiratory failure secondary to pulmonary edema.  #1 acute respiratory failure-hypoxic. Secondary to pulmonary edema. -Continue BiPAP. For dialysis today and wean off oxygen as tolerated. -Patient does use home oxygen. - ECHO pending  #2 Malignant HTN- could have triggered pulm edema - on nitroglycerin drip- restart oral meds- on norvasc, lisinopril, lasix and coreg - after HD- nitro drip can be weaned as tolerated - if needed, titrate oral meds  #3 DM-check A1c. Patient is currently nothing by mouth due to being on BiPAP. -Sugars are elevated, start sliding scale insulin. -Apparently does not take any meds for diabetes at home. Need to be verified  #4 ESRD on Tuesday Thursday Saturday hemodialysis-due for dialysis today. Nephrology consulted.  #5 DVT prophylaxis-on subcutaneous heparin   Physical therapy consult once more alert. Social worker consult     All the records are reviewed and case discussed with Care Management/Social Workerr. Management plans discussed with the patient, family and they are in agreement.  CODE STATUS: Full code  TOTAL CRITICAL CARE TIME SPENT IN TAKING CARE OF THIS PATIENT: 37 minutes.   POSSIBLE D/C IN 2-3 DAYS, DEPENDING ON CLINICAL CONDITION.   Enid Baas M.D on 12/23/2015 at 8:21 AM  Between 7am to 6pm - Pager - [phone]  After 6pm go to [email or web address] - password [email or web address] [facility]  [facility]  Office  [phone]  CC: Primary care physician; No primary care provider on file.

## 2015-12-23 NOTE — Progress Notes (Signed)
PRE DIALYSIS ASSESSMENT 

## 2015-12-23 NOTE — Progress Notes (Signed)
Pharmacy Antibiotic Note  Tanner Campbell is a 40 y.o. male admitted on 12/22/2015 with sepsis.  Pharmacy has been consulted for vancomycin and aztreonam dosing.  Plan: Vancomycin 1 gram given in ED. Additional 750 mg x1 ordered for total first dose of 1750 mg.  750 mg q dialysis will need to be ordered when HD schedule established.  Aztreonam 2 grams x1 given in ED.  500 mg q 8 hours ordered as continuation  Height:  (165.1 cm) Weight: 168 lb (76.204 kg) IBW/kg (Calculated) : 61.5  Temp (24hrs), Avg:97.9 F (36.6 C), Min:97.9 F (36.6 C), Max:97.9 F (36.6 C)   Recent Labs Lab 12/22/15 2101 12/22/15 2158  WBC 12.2*  --   CREATININE 5.38*  --   LATICACIDVEN  --  1.5    Estimated Creatinine Clearance: 17.2 mL/min (by C-G formula based on Cr of 5.38).    Allergies  Allergen Reactions  . Penicillins Other (See Comments)    Reaction: Unknown    Antimicrobials this admission: vancomycin  >>  aztreonan  >>   Dose adjustments this admission:   Microbiology results: 5/19 BCx: pending 5/19 UCx: pending  5/19 Sputum: pending    5/19 UA pending 5/19 CXR: bilateral opacities  Thank you for allowing pharmacy to be a part of this patient's care.  Kevaughn Ewing S 12/23/2015 1:08 AM

## 2015-12-23 NOTE — Progress Notes (Signed)
Pt is keeping medications in room, pt was explained that per policy medications need to be sent to pharmacy, pt refuses.

## 2015-12-23 NOTE — ED Notes (Signed)
Pharmacy called again for vancomycin dose.  

## 2015-12-23 NOTE — Progress Notes (Signed)
This note also relates to the following rows which could not be included: Pulse Rate - Cannot attach notes to unvalidated device data Resp - Cannot attach notes to unvalidated device data   DIALYSIS COMPLETED 

## 2015-12-23 NOTE — Progress Notes (Signed)
Pt transitioned to 5L [place]. Pt tolerating well. No complaints of pain, SOB, or increased work of breathing. Will continue to assess.

## 2015-12-23 NOTE — Progress Notes (Signed)
Report called to receiving nurse.  Pt transported to room 242 in stable condition. Denying pain.  All belongings with pt.

## 2015-12-23 NOTE — Progress Notes (Signed)
POST DIALYSIS ASSESSMENT 

## 2015-12-23 NOTE — Progress Notes (Signed)
Transported pt to ICU on Bipap without incident. 

## 2015-12-23 NOTE — Progress Notes (Signed)
Per Dr. Nemiah Commander okay to d/c isolation. Trudee Kuster

## 2015-12-23 NOTE — Progress Notes (Signed)
Pharmacy Antibiotic Note  Tanner Campbell is a 40 y.o. male admitted on 12/22/2015 with sepsis.  Pharmacy has been consulted for vancomycin and aztreonam dosing.  Plan: 750 mg q dialysis ordered for TTSat HD schedule. Pt in HD currently, plans are to continue on TTS. Pharmacy will continue to monitor and adjust vancomycin schedule if HD schedule changes.  Aztreonam 2 grams x1 given in ED.  500 mg q 8 hours ordered as continuation  Height: 5\' 5"  (165.1 cm) Weight: 162 lb 11.2 oz (73.8 kg) IBW/kg (Calculated) : 61.5  Temp (24hrs), Avg:98 F (36.7 C), Min:97.9 F (36.6 C), Max:98.1 F (36.7 C)   Recent Labs Lab 12/22/15 2101 12/22/15 2158 12/23/15 0424  WBC 12.2*  --  12.3*  CREATININE 5.38*  --  5.83*  LATICACIDVEN  --  1.5  --     Estimated Creatinine Clearance: 15.7 mL/min (by C-G formula based on Cr of 5.83).    Allergies  Allergen Reactions  . Penicillins Other (See Comments)    Reaction: Unknown    Antimicrobials this admission: Vancomycin  5/20 >>  Aztreonan 5/20 >>   Microbiology results: 5/19 BCx: pending 5/19 UCx: pending  5/19 Sputum: pending   5/19 UA pending 5/19 CXR: bilateral opacities  Thank you for allowing pharmacy to be a part of this patient's care.  Roque Cash, PharmD Pharmacy Resident 12/23/2015

## 2015-12-23 NOTE — Progress Notes (Signed)
This note also relates to the following rows which could not be included: Pulse Rate - Cannot attach notes to unvalidated device data Resp - Cannot attach notes to unvalidated device data BP - Cannot attach notes to unvalidated device data   BEGIN HEMODIALYSIS TREATMENT

## 2015-12-23 NOTE — Clinical Social Work Note (Signed)
Clinical Social Work Assessment  Patient Details  Name: Tanner Campbell MRN: [id number] Date of Birth: 12/31/1975  Date of referral:  12/23/15               Reason for consult:  Discharge Planning, Family Concerns                Permission sought to share information with:  Family Supports Permission granted to share information::  Yes, Verbal Permission Granted  Name::     Chrytal Ciani [phone]  Agency::  na  Relationship::  yes  Contact Information:  yes  Housing/Transportation Living arrangements for the past 2 months:  Single Family Home Source of Information:  Patient Patient Interpreter Needed:  None Criminal Activity/Legal Involvement Pertinent to Current Situation/Hospitalization:  No - Comment as needed Significant Relationships:  Siblings Lives with:    Do you feel safe going back to the place where you live?  Yes Need for family participation in patient care:  No (Coment)  Care giving concerns:  TBD-awaiting call back   Social Worker assessment / plan: LCSW met with patient he was a bit lethargic but then was awakened by nurse. Patient agreeable to do assessment. His plan is to return home with his sister. He is connected to [facility] and will go on Tues, Thurs, Saturday for Hemodialysis. He has transportation already  Arranged to dialysis clinic. He has severe diabetes which caused his kidney failure. He has recently ended a relationship with a women in [place] and is not sure if he will remain here or return to [place]. LCSW provided a community resource for DSS office and explained he should get his medicare benefits and SSDI changed over to [place]. ( information provided) No assistance required with any of his ADL's. No further needs as per patient.  Employment status:  Disabled (Comment on whether or not currently receiving Disability) (Recieved disability from [place]) Insurance information:  Medicare (From [place]) PT Recommendations:  Not assessed at this  time Information / Referral to community resources:  Other (Comment Required) (Local DSS office)  Patient/Family's Response to care: I want to get discharged and return to my sisters for now. I hope to feel better after dialysis  Patient/Family's Understanding of and Emotional Response to Diagnosis, Current Treatment, and Prognosis:  "Im OK just don't know if Im staying in [place]" Either way I understand the importance of my hemo dialysis or Im going to wind up here again  Emotional Assessment Appearance:  Appears stated age Attitude/Demeanor/Rapport:  Lethargic Affect (typically observed):  Agitated, Appropriate Orientation:  Oriented to Self, Oriented to Place, Oriented to  Time, Oriented to Situation Alcohol / Substance use:  Never Used Psych involvement (Current and /or in the community):  No (Comment)  Discharge Needs  Concerns to be addressed:  No discharge needs identified Readmission within the last 30 days:  No Current discharge risk:  None Barriers to Discharge:  Continued Medical Work up   ,  M, LCSW 12/23/2015, 1:36 PM  

## 2015-12-23 NOTE — ED Notes (Signed)
Pharmacy called for vancomycin dose.

## 2015-12-23 NOTE — Progress Notes (Signed)
LCSW met with patient and completed his assessment. He will continue with [facility] on Tues-Thursday and Saturday and return to his sisters house. He has [employer] and [place] SSDI. [employer] provided and reviewed financial ( DSS office so patient can transfer Medicare and SSDI benefits if he chooses to remain in [place]. No further needs  Mija Effertz LCSW

## 2015-12-23 NOTE — Progress Notes (Signed)
[facility]  ROUNDING NOTE   Subjective:   Admitted to ICU for flash pulmonary edema and respiratory distress. Placed on Bipap overnight. He denies missing hemodialysis treatment on Thursday  Patient now on [place] o2. Requesting breakfast.   Objective:  Vital signs in last 24 hours:  Temp:  [97.9 F (36.6 C)-98 F (36.7 C)] 98 F (36.7 C) (05/20 0315) Pulse Rate:  [95-123] 95 (05/20 0700) Resp:  [13-30] 13 (05/20 0700) BP: (119-185)/(54-102) 119/59 mmHg (05/20 0700) SpO2:  [92 %-100 %] 97 % (05/20 0700) FiO2 (%):  [100 %] 100 % (05/20 0315) Weight:  [73.8 kg (162 lb 11.2 oz)-76.204 kg (168 lb)] 73.8 kg (162 lb 11.2 oz) (05/20 0315)  Weight change:  Filed Weights   12/22/15 2053 12/23/15 0315  Weight: 76.204 kg (168 lb) 73.8 kg (162 lb 11.2 oz)    Intake/Output: I/O last 3 completed shifts: In: 317.2 [I.V.:167.2; IV Piggyback:150] Out: -    Intake/Output this shift:     Physical Exam: General: NAD, laying in bed.   Head: Normocephalic, atraumatic. Moist oral mucosal membranes  Eyes: Anicteric, PERRL  Neck: Supple, trachea midline  Lungs:  Bilateral wheezing  Heart: Regular rate and rhythm  Abdomen:  Soft, nontender  Extremities: trace peripheral edema.  Neurologic: Nonfocal, moving all four extremities  Skin: No lesions  Access: Left arm AVF    Basic Metabolic Panel:  Recent Labs Lab 12/22/15 2101 12/23/15 0424  NA 138 137  K 5.3* 4.1  CL 102 101  CO2 23 25  GLUCOSE 248* 370*  BUN 50* 55*  CREATININE 5.38* 5.83*  CALCIUM 8.2* 7.9*    Liver Function Tests:  Recent Labs Lab 12/22/15 2101  AST 30  ALT 15*  ALKPHOS 263*  BILITOT 1.5*  PROT 7.5  ALBUMIN 3.4*   No results for input(s): LIPASE, AMYLASE in the last 168 hours. No results for input(s): AMMONIA in the last 168 hours.  CBC:  Recent Labs Lab 12/22/15 2101 12/23/15 0424  WBC 12.2* 12.3*  NEUTROABS 10.6*  --   HGB 12.9* 10.4*  HCT 40.8 33.6*  MCV 88.5 88.6  PLT 161  140*    Cardiac Enzymes:  Recent Labs Lab 12/22/15 2101 12/23/15 0424  TROPONINI 0.07* 0.15*    BNP: Invalid input(s): POCBNP  CBG:  Recent Labs Lab 12/22/15 2334 12/23/15 0107 12/23/15 0818  GLUCAP 296* 323* 297*    Microbiology: Results for orders placed or performed during the hospital encounter of 12/22/15  MRSA PCR Screening     Status: None   Collection Time: 12/23/15  3:18 AM  Result Value Ref Range Status   MRSA by PCR NEGATIVE NEGATIVE Final    Comment:        The GeneXpert MRSA Assay (FDA approved for NASAL specimens only), is one component of a comprehensive MRSA colonization surveillance program. It is not intended to diagnose MRSA infection nor to guide or monitor treatment for MRSA infections.     Coagulation Studies: No results for input(s): LABPROT, INR in the last 72 hours.  Urinalysis: No results for input(s): COLORURINE, LABSPEC, PHURINE, GLUCOSEU, HGBUR, BILIRUBINUR, KETONESUR, PROTEINUR, UROBILINOGEN, NITRITE, LEUKOCYTESUR in the last 72 hours.  Invalid input(s): APPERANCEUR    Imaging: Dg Chest 1 View  12/22/2015  CLINICAL DATA:  Respiratory distress EXAM: CHEST 1 VIEW COMPARISON:  None. FINDINGS: Bilateral pleural effusions with underlying opacity are identified. The heart, hila, and mediastinum are unremarkable. No pneumothorax. IMPRESSION: 1. Bilateral pleural effusions with underlying opacities. The  underlying opacities could represent atelectasis but infiltrate is not excluded. Recommend follow-up to resolution. Electronically Signed   By: Gerome Sam III M.D   On: 12/22/2015 21:28     Medications:   . nitroGLYCERIN 80 mcg/min (12/23/15 0650)   . amLODipine  5 mg Oral Daily  . antiseptic oral rinse  7 mL Mouth Rinse q12n4p  . atorvastatin  40 mg Oral QPM  . aztreonam  500 mg Intravenous Q8H  . carvedilol  25 mg Oral BID WC  . chlorhexidine  15 mL Mouth Rinse BID  . Chlorhexidine Gluconate Cloth  6 each Topical Q0600   . DULoxetine  30 mg Oral Daily  . furosemide  80 mg Oral BID  . heparin  5,000 Units Subcutaneous Q8H  . insulin aspart  0-5 Units Subcutaneous QHS  . insulin aspart  0-9 Units Subcutaneous TID WC  . lisinopril  20 mg Oral QPM  . mupirocin ointment  1 application Nasal BID  . sodium chloride flush  3 mL Intravenous Q12H   acetaminophen **OR** acetaminophen, ipratropium-albuterol, ondansetron **OR** ondansetron (ZOFRAN) IV  Assessment/ Plan:  [name] is a 40 y.o. white male with End-stage renal disease with hemodialysis Started hemodialysis May 02, 2014, Hypertension, Diabetes mellitus type 2, hyperlipidemia, Multiple sclerosis, COPD, Back surgery, Cholecystectomy, Tobacco use   TTS [facility] left arm AVF  1. End Stage Renal Disease: with pulmonary edema. TTS  - emergent hemodialysis for today.  - Continue TTS schedule.  2. Hypertension: placed on nitro gtt. Wean off today.   3. Anemia of chronic kidney disease: hemoglobin 10.4 - hold epo due to hypertension and pulmonary edema.   4. Secondary Hyperparathyroidism: with hyperphosphatemia: outpatient phosphorus of 9.5. PTH at goal, 357. - calcium acetate    LOS: 1 Tanner Campbell 5/20/20178:40 AM

## 2015-12-23 NOTE — Progress Notes (Signed)
Patient transferred from CCU, oriented to room, fall, and safety contract reviewed. Focused assessment as charted. No complaints at this time. Skin verified with Morrie Sheldon, RN. Telemetry box verified with Ledell Noss, NT. Tanner Campbell

## 2015-12-24 ENCOUNTER — Inpatient Hospital Stay: Payer: Medicare Other

## 2015-12-24 LAB — HEPATITIS B SURFACE ANTIGEN: Hepatitis B Surface Ag: NEGATIVE

## 2015-12-24 LAB — CBC
HCT: 33.3 % — ABNORMAL LOW (ref 40.0–52.0)
HEMOGLOBIN: 10.7 g/dL — AB (ref 13.0–18.0)
MCH: 28.1 pg (ref 26.0–34.0)
MCHC: 32 g/dL (ref 32.0–36.0)
MCV: 87.7 fL (ref 80.0–100.0)
Platelets: 116 10*3/uL — ABNORMAL LOW (ref 150–440)
RBC: 3.8 MIL/uL — AB (ref 4.40–5.90)
RDW: 16.8 % — ABNORMAL HIGH (ref 11.5–14.5)
WBC: 8.9 10*3/uL (ref 3.8–10.6)

## 2015-12-24 LAB — GLUCOSE, CAPILLARY
GLUCOSE-CAPILLARY: 239 mg/dL — AB (ref 65–99)
Glucose-Capillary: 144 mg/dL — ABNORMAL HIGH (ref 65–99)

## 2015-12-24 LAB — BASIC METABOLIC PANEL
Anion gap: 9 (ref 5–15)
BUN: 45 mg/dL — AB (ref 6–20)
CHLORIDE: 99 mmol/L — AB (ref 101–111)
CO2: 30 mmol/L (ref 22–32)
Calcium: 8.4 mg/dL — ABNORMAL LOW (ref 8.9–10.3)
Creatinine, Ser: 4.23 mg/dL — ABNORMAL HIGH (ref 0.61–1.24)
GFR calc Af Amer: 19 mL/min — ABNORMAL LOW (ref >60)
GFR calc non Af Amer: 16 mL/min — ABNORMAL LOW (ref >60)
GLUCOSE: 164 mg/dL — AB (ref 65–99)
POTASSIUM: 4.7 mmol/L (ref 3.5–5.1)
Sodium: 138 mmol/L (ref 135–145)

## 2015-12-24 LAB — HEPATITIS B CORE ANTIBODY, TOTAL: Hep B Core Total Ab: NEGATIVE

## 2015-12-24 LAB — HEPATITIS B SURFACE ANTIBODY,QUALITATIVE: Hep B S Ab: REACTIVE

## 2015-12-24 NOTE — Care Management Note (Addendum)
Case Management Note  Patient Details  Name: Tanner Campbell MRN: [id number] Date of Birth: 1976-03-06  Subjective/Objective:   Discharge to home. Chronic home O2. Sister to bring portable O2 tank to [facility] for transport home today. Ivor Reining dialysis liaison was updated per D/C. [facility] on TTF.                Action/Plan:   Expected Discharge Date:                  Expected Discharge Plan:     In-House Referral:     Discharge planning Services     Post Acute Care Choice:    Choice offered to:     DME Arranged:    DME Agency:     HH Arranged:    HH Agency:     Status of Service:     Medicare Important Message Given:    Date Medicare IM Given:    Medicare IM give by:    Date Additional Medicare IM Given:    Additional Medicare Important Message give by:     If discussed at Long Length of Stay Meetings, dates discussed:    Additional Comments:  Krystian Ferrentino A, RN 12/24/2015, 10:06 AM

## 2015-12-24 NOTE — Progress Notes (Signed)
Patient d/c'd home. Education provided, no questions at this time. Patient picked up by sister with portable oxygen tank. Telemetry removed. Home medications returned. Trudee Kuster

## 2015-12-24 NOTE — Progress Notes (Signed)
Pt rerfusing to put medications in pharmacy. However is agreeably to put them in his pt bin. Per pt, his sister will come pick them up in the morning. Will defer information to dayshift RN.   Mayra Neer M

## 2015-12-24 NOTE — Progress Notes (Signed)
[facility]  ROUNDING NOTE   Subjective:   Transferred to telemetry.  Hemodialysis yesterday. Tolerated treatment well. UF of 1.5 litres.   Objective:  Vital signs in last 24 hours:  Temp:  [97.7 F (36.5 C)-98.4 F (36.9 C)] 98.4 F (36.9 C) (05/21 0437) Pulse Rate:  [75-94] 75 (05/21 0845) Resp:  [11-23] 20 (05/21 0437) BP: (105-152)/(55-121) 109/59 mmHg (05/21 0845) SpO2:  [83 %-100 %] 98 % (05/21 0437) Weight:  [75.116 kg (165 lb 9.6 oz)-75.2 kg (165 lb 12.6 oz)] 75.116 kg (165 lb 9.6 oz) (05/21 0437)  Weight change: -1.004 kg (-2 lb 3.4 oz) Filed Weights   12/23/15 0315 12/23/15 1130 12/24/15 0437  Weight: 73.8 kg (162 lb 11.2 oz) 75.2 kg (165 lb 12.6 oz) 75.116 kg (165 lb 9.6 oz)    Intake/Output: I/O last 3 completed shifts: In: 1067.8 [P.O.:240; I.V.:377.8; IV Piggyback:450] Out: 1350 [Other:1350]   Intake/Output this shift:  Total I/O In: 240 [P.O.:240] Out: -   Physical Exam: General: NAD, laying in bed.   Head: Normocephalic, atraumatic. Moist oral mucosal membranes  Eyes: Anicteric, PERRL  Neck: Supple, trachea midline  Lungs:  clear  Heart: Regular rate and rhythm  Abdomen:  Soft, nontender  Extremities: No peripheral edema.  Neurologic: Nonfocal, moving all four extremities  Skin: No lesions  Access: Left arm AVF    Basic Metabolic Panel:  Recent Labs Lab 12/22/15 2101 12/23/15 0424 12/24/15 0452  NA 138 137 138  K 5.3* 4.1 4.7  CL 102 101 99*  CO2 23 25 30   GLUCOSE 248* 370* 164*  BUN 50* 55* 45*  CREATININE 5.38* 5.83* 4.23*  CALCIUM 8.2* 7.9* 8.4*    Liver Function Tests:  Recent Labs Lab 12/22/15 2101  AST 30  ALT 15*  ALKPHOS 263*  BILITOT 1.5*  PROT 7.5  ALBUMIN 3.4*   No results for input(s): LIPASE, AMYLASE in the last 168 hours. No results for input(s): AMMONIA in the last 168 hours.  CBC:  Recent Labs Lab 12/22/15 2101 12/23/15 0424 12/24/15 0452  WBC 12.2* 12.3* 8.9  NEUTROABS 10.6*  --    --   HGB 12.9* 10.4* 10.7*  HCT 40.8 33.6* 33.3*  MCV 88.5 88.6 87.7  PLT 161 140* 116*    Cardiac Enzymes:  Recent Labs Lab 12/22/15 2101 12/23/15 0424 12/23/15 0916 12/23/15 1438  TROPONINI 0.07* 0.15* 0.15* 0.14*    BNP: Invalid input(s): POCBNP  CBG:  Recent Labs Lab 12/23/15 1048 12/23/15 1144 12/23/15 1621 12/23/15 2051 12/24/15 0727  GLUCAP 279* 271* 223* 182* 144*    Microbiology: Results for orders placed or performed during the hospital encounter of 12/22/15  Blood Culture (routine x 2)     Status: None (Preliminary result)   Collection Time: 12/22/15  9:58 PM  Result Value Ref Range Status   Specimen Description BLOOD RIGHT ARM  Final   Special Requests BOTTLES DRAWN AEROBIC AND ANAEROBIC 7CC  Final   Culture NO GROWTH < 24 HOURS  Final   Report Status PENDING  Incomplete  Blood Culture (routine x 2)     Status: None (Preliminary result)   Collection Time: 12/22/15 10:18 PM  Result Value Ref Range Status   Specimen Description BLOOD RIGHT HAND  Final   Special Requests BOTTLES DRAWN AEROBIC AND ANAEROBIC 8CC  Final   Culture NO GROWTH < 24 HOURS  Final   Report Status PENDING  Incomplete  MRSA PCR Screening     Status: None  Collection Time: 12/23/15  3:18 AM  Result Value Ref Range Status   MRSA by PCR NEGATIVE NEGATIVE Final    Comment:        The GeneXpert MRSA Assay (FDA approved for NASAL specimens only), is one component of a comprehensive MRSA colonization surveillance program. It is not intended to diagnose MRSA infection nor to guide or monitor treatment for MRSA infections.     Coagulation Studies: No results for input(s): LABPROT, INR in the last 72 hours.  Urinalysis: No results for input(s): COLORURINE, LABSPEC, PHURINE, GLUCOSEU, HGBUR, BILIRUBINUR, KETONESUR, PROTEINUR, UROBILINOGEN, NITRITE, LEUKOCYTESUR in the last 72 hours.  Invalid input(s): APPERANCEUR    Imaging: Dg Chest 1 View  12/22/2015  CLINICAL DATA:   Respiratory distress EXAM: CHEST 1 VIEW COMPARISON:  None. FINDINGS: Bilateral pleural effusions with underlying opacity are identified. The heart, hila, and mediastinum are unremarkable. No pneumothorax. IMPRESSION: 1. Bilateral pleural effusions with underlying opacities. The underlying opacities could represent atelectasis but infiltrate is not excluded. Recommend follow-up to resolution. Electronically Signed   By: Gerome Sam III M.D   On: 12/22/2015 21:28     Medications:   . nitroGLYCERIN Stopped (12/23/15 1600)   . amLODipine  5 mg Oral Daily  . antiseptic oral rinse  7 mL Mouth Rinse BID  . atorvastatin  40 mg Oral QPM  . aztreonam  500 mg Intravenous Q8H  . calcium acetate  2,001 mg Oral TID WC  . carvedilol  25 mg Oral BID WC  . Chlorhexidine Gluconate Cloth  6 each Topical Q0600  . DULoxetine  30 mg Oral Daily  . furosemide  80 mg Oral BID  . heparin  5,000 Units Subcutaneous Q8H  . insulin aspart  0-5 Units Subcutaneous QHS  . insulin aspart  0-9 Units Subcutaneous TID WC  . lisinopril  20 mg Oral QPM  . mupirocin ointment  1 application Nasal BID  . sodium chloride flush  3 mL Intravenous Q12H  . vancomycin  750 mg Intravenous Q T,Th,Sa-HD   acetaminophen **OR** acetaminophen, alum & mag hydroxide-simeth, ipratropium-albuterol, ondansetron **OR** ondansetron (ZOFRAN) IV  Assessment/ Plan:  [name] is a 40 y.o. white male with End-stage renal disease with hemodialysis Started hemodialysis May 02, 2014, Hypertension, Diabetes mellitus type 2, hyperlipidemia, Multiple sclerosis, COPD, Back surgery, Cholecystectomy, Tobacco use   TTS [facility]. left arm AVF  1. End Stage Renal Disease: with pulmonary edema. TTS  - emergent hemodialysis yesterday. - Continue TTS schedule. Next treatment for Tuesday  2. Hypertension: placed on nitro gtt. Weaned off now.  - home regomein of amlodipine, carvedilol, furosemide and lisinopril.   3. Anemia of  chronic kidney disease:  - held epo due to hypertension and pulmonary edema.   4. Secondary Hyperparathyroidism: with hyperphosphatemia: outpatient phosphorus of 9.5. PTH at goal, 357. - calcium acetate with meals.      LOS: 2 Tanner Campbell 5/21/201710:30 AM

## 2015-12-24 NOTE — Discharge Summary (Signed)
[facility] at [facility]   PATIENT NAME: Tanner Campbell    MR#:  [id number]  DATE OF BIRTH:  [birth year]  DATE OF ADMISSION:  12/22/2015 ADMITTING PHYSICIAN: Oralia Manis, MD  DATE OF DISCHARGE: 12/24/2015  PRIMARY CARE PHYSICIAN: No primary care provider on file.    ADMISSION DIAGNOSIS:  Flash pulmonary edema (HCC) [J81.0] HCAP (healthcare-associated pneumonia) [J18.9]  DISCHARGE DIAGNOSIS:  Principal Problem:   Sepsis (HCC) Active Problems:   Accelerated hypertension   Flash pulmonary edema (HCC)   ESRD on dialysis (HCC)   Type 2 diabetes mellitus (HCC)   COPD (chronic obstructive pulmonary disease) (HCC)   SECONDARY DIAGNOSIS:   Past Medical History  Diagnosis Date  . Diabetes mellitus without complication (HCC)   . Hypertension   . COPD (chronic obstructive pulmonary disease) (HCC)   . Renal disorder   . Neuropathy (HCC)   . Hemodialysis patient (HCC)   . High cholesterol     HOSPITAL COURSE:   40 year old male with past medical history significant for hypertension, diabetes, COPD and ESRD on hemodialysis presents with acute respiratory failure secondary to pulmonary edema.  #1 Acute respiratory failure-hypoxic. Secondary to pulmonary edema. Resolved now. -Off BiPAP. Back to 2-3 L oxygen via nasal cannula which is his home oxygen - ECHO pending likely has diastolic dysfunction. -Uses Lasix 80 mg twice a day at home which will be continued -Repeat chest x-ray with mild improvement  #2 Malignant HTN- could have triggered pulm edema - off nitroglycerin drip- restarted home oral meds- on norvasc, lisinopril, lasix and coreg -Advised to be compliant with his medications. Blood pressure is much improved today.  #3 diabetes mellitus- A1c is 7.6.  -Apparently does not take any meds for diabetes at home.  -Sugars are borderline elevated. Follow-up with PCP  #4 ESRD on Tuesday Thursday Saturday hemodialysis- -appreciate  nephrology consult. Had dialysis done yesterday. Next dialysis on Tuesday per schedule. Patient is very eager to go home today. He acknowledged that he need to take his blood pressure medicines otherwise might end up in the hospital again.  Discharge today  DISCHARGE CONDITIONS:   Guarded  CONSULTS OBTAINED:  Treatment Team:  Lamont Dowdy, MD  DRUG ALLERGIES:   Allergies  Allergen Reactions  . Penicillins Other (See Comments)    Reaction: Unknown    DISCHARGE MEDICATIONS:   Current Discharge Medication List    CONTINUE these medications which have NOT CHANGED   Details  albuterol (PROVENTIL HFA;VENTOLIN HFA) 108 (90 Base) MCG/ACT inhaler Inhale 1-2 puffs into the lungs every 6 (six) hours as needed for wheezing or shortness of breath.    amLODipine (NORVASC) 5 MG tablet Take 5 mg by mouth daily.    atorvastatin (LIPITOR) 40 MG tablet Take 40 mg by mouth every evening.    calcium acetate (PHOSLO) 667 MG capsule Take 2,001 mg by mouth 3 (three) times daily with meals.    carvedilol (COREG) 25 MG tablet Take 25 mg by mouth 2 (two) times daily with a meal.    DULoxetine (CYMBALTA) 30 MG capsule Take 30 mg by mouth daily.    Ferric Citrate (AURYXIA PO) Take by mouth.    folic acid-vitamin b complex-vitamin c-selenium-zinc (DIALYVITE) 3 MG TABS tablet Take 1 tablet by mouth daily.    furosemide (LASIX) 80 MG tablet Take 80 mg by mouth 2 (two) times daily.    gabapentin (NEURONTIN) 300 MG capsule Take 300 mg by mouth at bedtime.    lisinopril (PRINIVIL,ZESTRIL) 20  MG tablet Take 20 mg by mouth every evening.    risperiDONE (RISPERDAL) 0.5 MG tablet Take 0.5 mg by mouth at bedtime.         DISCHARGE INSTRUCTIONS:   1. Follow up for dialysis in 2 days on Tuesday 2. Advised to be complaint with medications 3. Continue 2-3 L oxygen at discharge which is his home oxygen through [employer]   If you experience worsening of your admission symptoms, develop shortness of  breath, life threatening emergency, suicidal or homicidal thoughts you must seek medical attention immediately by calling 911 or calling your MD immediately  if symptoms less severe.  You Must read complete instructions/literature along with all the possible adverse reactions/side effects for all the Medicines you take and that have been prescribed to you. Take any new Medicines after you have completely understood and accept all the possible adverse reactions/side effects.   Please note  You were cared for by a hospitalist during your hospital stay. If you have any questions about your discharge medications or the care you received while you were in the hospital after you are discharged, you can call the unit and asked to speak with the hospitalist on call if the hospitalist that took care of you is not available. Once you are discharged, your primary care physician will handle any further medical issues. Please note that NO REFILLS for any discharge medications will be authorized once you are discharged, as it is imperative that you return to your primary care physician (or establish a relationship with a primary care physician if you do not have one) for your aftercare needs so that they can reassess your need for medications and monitor your lab values.    Today   CHIEF COMPLAINT:   Chief Complaint  Patient presents with  . Respiratory Distress    VITAL SIGNS:  Blood pressure 109/59, pulse 75, temperature 98.4 F (36.9 C), temperature source Oral, resp. rate 20, height 5\' 5"  (1.651 m), weight 75.116 kg (165 lb 9.6 oz), SpO2 98 %.  I/O:   Intake/Output Summary (Last 24 hours) at 12/24/15 0941 Last data filed at 12/24/15 0651  Gross per 24 hour  Intake 654.57 ml  Output   1350 ml  Net -695.43 ml    PHYSICAL EXAMINATION:   Physical Exam  GENERAL:  40 y.o.-year-old patient sitting in the bed with no acute distress.  EYES: Pupils equal, round, reactive to light and accommodation.  No scleral icterus. Extraocular muscles intact.  HEENT: Head atraumatic, normocephalic. Oropharynx and nasopharynx clear.  NECK:  Supple, no jugular venous distention. No thyroid enlargement, no tenderness.  LUNGS: Normal breath sounds bilaterally, no wheezing, rales,rhonchi or crepitation. No use of accessory muscles of respiration. Fine bibasilar crackles heard. CARDIOVASCULAR: S1, S2 normal. No rubs, or gallops. 3/6 systolic murmur present ABDOMEN: Soft, non-tender, non-distended. Bowel sounds present. No organomegaly or mass.  EXTREMITIES: No pedal edema, cyanosis, or clubbing. Left arm fistula present NEUROLOGIC: Cranial nerves II through XII are intact. Muscle strength 5/5 in all extremities. Sensation intact. Gait not checked.  PSYCHIATRIC: The patient is alert and oriented x 3.  SKIN: No obvious rash, lesion, or ulcer.   DATA REVIEW:   CBC  Recent Labs Lab 12/24/15 0452  WBC 8.9  HGB 10.7*  HCT 33.3*  PLT 116*    Chemistries   Recent Labs Lab 12/22/15 2101  12/24/15 0452  NA 138  < > 138  K 5.3*  < > 4.7  CL 102  < >  99*  CO2 23  < > 30  GLUCOSE 248*  < > 164*  BUN 50*  < > 45*  CREATININE 5.38*  < > 4.23*  CALCIUM 8.2*  < > 8.4*  AST 30  --   --   ALT 15*  --   --   ALKPHOS 263*  --   --   BILITOT 1.5*  --   --   < > = values in this interval not displayed.  Cardiac Enzymes  Recent Labs Lab 12/23/15 1438  TROPONINI 0.14*    Microbiology Results  Results for orders placed or performed during the hospital encounter of 12/22/15  Blood Culture (routine x 2)     Status: None (Preliminary result)   Collection Time: 12/22/15  9:58 PM  Result Value Ref Range Status   Specimen Description BLOOD RIGHT ARM  Final   Special Requests BOTTLES DRAWN AEROBIC AND ANAEROBIC 7CC  Final   Culture NO GROWTH < 24 HOURS  Final   Report Status PENDING  Incomplete  Blood Culture (routine x 2)     Status: None (Preliminary result)   Collection Time: 12/22/15 10:18 PM   Result Value Ref Range Status   Specimen Description BLOOD RIGHT HAND  Final   Special Requests BOTTLES DRAWN AEROBIC AND ANAEROBIC 8CC  Final   Culture NO GROWTH < 24 HOURS  Final   Report Status PENDING  Incomplete  MRSA PCR Screening     Status: None   Collection Time: 12/23/15  3:18 AM  Result Value Ref Range Status   MRSA by PCR NEGATIVE NEGATIVE Final    Comment:        The GeneXpert MRSA Assay (FDA approved for NASAL specimens only), is one component of a comprehensive MRSA colonization surveillance program. It is not intended to diagnose MRSA infection nor to guide or monitor treatment for MRSA infections.     RADIOLOGY:  Dg Chest 1 View  12/22/2015  CLINICAL DATA:  Respiratory distress EXAM: CHEST 1 VIEW COMPARISON:  None. FINDINGS: Bilateral pleural effusions with underlying opacity are identified. The heart, hila, and mediastinum are unremarkable. No pneumothorax. IMPRESSION: 1. Bilateral pleural effusions with underlying opacities. The underlying opacities could represent atelectasis but infiltrate is not excluded. Recommend follow-up to resolution. Electronically Signed   By: Gerome Sam III M.D   On: 12/22/2015 21:28    EKG:  No orders found for this or any previous visit.    Management plans discussed with the patient, family and they are in agreement.  CODE STATUS:     Code Status Orders        Start     Ordered   12/22/15 2324  Full code   Continuous     12/22/15 2323    Code Status History    Date Active Date Inactive Code Status Order ID Comments User Context   This patient has a current code status but no historical code status.      TOTAL TIME TAKING CARE OF THIS PATIENT: 37 minutes.    Enid Baas M.D on 12/24/2015 at 9:41 AM  Between 7am to 6pm - Pager - [phone]  After 6pm go to [email or web address] - password [email or web address] [facility]  [facility]  Office  [phone]  CC: Primary care physician; No primary care  provider on file.

## 2015-12-26 LAB — GLUCOSE, CAPILLARY: GLUCOSE-CAPILLARY: 341 mg/dL — AB (ref 65–99)

## 2015-12-27 LAB — CULTURE, BLOOD (ROUTINE X 2)
Culture: NO GROWTH
Culture: NO GROWTH

## 2016-01-24 ENCOUNTER — Other Ambulatory Visit: Payer: Self-pay

## 2016-01-24 ENCOUNTER — Inpatient Hospital Stay
Admission: EM | Admit: 2016-01-24 | Discharge: 2016-02-01 | DRG: 207 | Disposition: A | Discharge status: Other Acute Inpatient Hospital | Payer: Medicare Other | Attending: Internal Medicine | Admitting: Internal Medicine

## 2016-01-24 ENCOUNTER — Emergency Department: Payer: Medicare Other

## 2016-01-24 ENCOUNTER — Encounter: Payer: Self-pay | Admitting: Emergency Medicine

## 2016-01-24 DIAGNOSIS — J189 Pneumonia, unspecified organism: Secondary | ICD-10-CM | POA: Diagnosis not present

## 2016-01-24 DIAGNOSIS — J9621 Acute and chronic respiratory failure with hypoxia: Secondary | ICD-10-CM | POA: Diagnosis not present

## 2016-01-24 DIAGNOSIS — K567 Ileus, unspecified: Secondary | ICD-10-CM | POA: Diagnosis not present

## 2016-01-24 DIAGNOSIS — J9622 Acute and chronic respiratory failure with hypercapnia: Secondary | ICD-10-CM | POA: Diagnosis present

## 2016-01-24 DIAGNOSIS — E875 Hyperkalemia: Secondary | ICD-10-CM | POA: Diagnosis present

## 2016-01-24 DIAGNOSIS — Z4659 Encounter for fitting and adjustment of other gastrointestinal appliance and device: Secondary | ICD-10-CM

## 2016-01-24 DIAGNOSIS — R0902 Hypoxemia: Secondary | ICD-10-CM | POA: Diagnosis present

## 2016-01-24 DIAGNOSIS — J9600 Acute respiratory failure, unspecified whether with hypoxia or hypercapnia: Secondary | ICD-10-CM

## 2016-01-24 DIAGNOSIS — R197 Diarrhea, unspecified: Secondary | ICD-10-CM | POA: Diagnosis not present

## 2016-01-24 DIAGNOSIS — E1122 Type 2 diabetes mellitus with diabetic chronic kidney disease: Secondary | ICD-10-CM | POA: Diagnosis present

## 2016-01-24 DIAGNOSIS — E7800 Pure hypercholesterolemia, unspecified: Secondary | ICD-10-CM | POA: Diagnosis present

## 2016-01-24 DIAGNOSIS — R0600 Dyspnea, unspecified: Secondary | ICD-10-CM

## 2016-01-24 DIAGNOSIS — N186 End stage renal disease: Secondary | ICD-10-CM | POA: Diagnosis present

## 2016-01-24 DIAGNOSIS — Y844 Aspiration of fluid as the cause of abnormal reaction of the patient, or of later complication, without mention of misadventure at the time of the procedure: Secondary | ICD-10-CM | POA: Diagnosis not present

## 2016-01-24 DIAGNOSIS — J9620 Acute and chronic respiratory failure, unspecified whether with hypoxia or hypercapnia: Secondary | ICD-10-CM

## 2016-01-24 DIAGNOSIS — Z9981 Dependence on supplemental oxygen: Secondary | ICD-10-CM

## 2016-01-24 DIAGNOSIS — J811 Chronic pulmonary edema: Secondary | ICD-10-CM | POA: Diagnosis present

## 2016-01-24 DIAGNOSIS — Z880 Allergy status to penicillin: Secondary | ICD-10-CM

## 2016-01-24 DIAGNOSIS — G35 Multiple sclerosis: Secondary | ICD-10-CM | POA: Diagnosis present

## 2016-01-24 DIAGNOSIS — J90 Pleural effusion, not elsewhere classified: Secondary | ICD-10-CM | POA: Diagnosis present

## 2016-01-24 DIAGNOSIS — Z79899 Other long term (current) drug therapy: Secondary | ICD-10-CM

## 2016-01-24 DIAGNOSIS — N179 Acute kidney failure, unspecified: Secondary | ICD-10-CM | POA: Diagnosis not present

## 2016-01-24 DIAGNOSIS — Z9889 Other specified postprocedural states: Secondary | ICD-10-CM | POA: Insufficient documentation

## 2016-01-24 DIAGNOSIS — Y92239 Unspecified place in hospital as the place of occurrence of the external cause: Secondary | ICD-10-CM | POA: Diagnosis not present

## 2016-01-24 DIAGNOSIS — I120 Hypertensive chronic kidney disease with stage 5 chronic kidney disease or end stage renal disease: Secondary | ICD-10-CM | POA: Diagnosis present

## 2016-01-24 DIAGNOSIS — B963 Hemophilus influenzae [H. influenzae] as the cause of diseases classified elsewhere: Secondary | ICD-10-CM | POA: Diagnosis present

## 2016-01-24 DIAGNOSIS — J441 Chronic obstructive pulmonary disease with (acute) exacerbation: Secondary | ICD-10-CM | POA: Diagnosis present

## 2016-01-24 DIAGNOSIS — S21109A Unspecified open wound of unspecified front wall of thorax without penetration into thoracic cavity, initial encounter: Secondary | ICD-10-CM | POA: Insufficient documentation

## 2016-01-24 DIAGNOSIS — D631 Anemia in chronic kidney disease: Secondary | ICD-10-CM | POA: Diagnosis present

## 2016-01-24 DIAGNOSIS — R1110 Vomiting, unspecified: Secondary | ICD-10-CM

## 2016-01-24 DIAGNOSIS — E1140 Type 2 diabetes mellitus with diabetic neuropathy, unspecified: Secondary | ICD-10-CM | POA: Diagnosis present

## 2016-01-24 DIAGNOSIS — R140 Abdominal distension (gaseous): Secondary | ICD-10-CM

## 2016-01-24 DIAGNOSIS — D696 Thrombocytopenia, unspecified: Secondary | ICD-10-CM | POA: Diagnosis present

## 2016-01-24 DIAGNOSIS — E872 Acidosis: Secondary | ICD-10-CM | POA: Diagnosis present

## 2016-01-24 DIAGNOSIS — N189 Chronic kidney disease, unspecified: Secondary | ICD-10-CM

## 2016-01-24 DIAGNOSIS — L8990 Pressure ulcer of unspecified site, unspecified stage: Secondary | ICD-10-CM | POA: Insufficient documentation

## 2016-01-24 DIAGNOSIS — Z9911 Dependence on respirator [ventilator] status: Secondary | ICD-10-CM

## 2016-01-24 DIAGNOSIS — J440 Chronic obstructive pulmonary disease with acute lower respiratory infection: Secondary | ICD-10-CM | POA: Diagnosis present

## 2016-01-24 DIAGNOSIS — J9601 Acute respiratory failure with hypoxia: Secondary | ICD-10-CM

## 2016-01-24 DIAGNOSIS — E876 Hypokalemia: Secondary | ICD-10-CM | POA: Diagnosis not present

## 2016-01-24 DIAGNOSIS — E785 Hyperlipidemia, unspecified: Secondary | ICD-10-CM | POA: Diagnosis present

## 2016-01-24 DIAGNOSIS — L7622 Postprocedural hemorrhage and hematoma of skin and subcutaneous tissue following other procedure: Secondary | ICD-10-CM | POA: Diagnosis not present

## 2016-01-24 DIAGNOSIS — N2581 Secondary hyperparathyroidism of renal origin: Secondary | ICD-10-CM | POA: Diagnosis present

## 2016-01-24 DIAGNOSIS — Z9115 Patient's noncompliance with renal dialysis: Secondary | ICD-10-CM

## 2016-01-24 DIAGNOSIS — Z992 Dependence on renal dialysis: Secondary | ICD-10-CM

## 2016-01-24 DIAGNOSIS — G9340 Encephalopathy, unspecified: Secondary | ICD-10-CM | POA: Diagnosis present

## 2016-01-24 DIAGNOSIS — F17200 Nicotine dependence, unspecified, uncomplicated: Secondary | ICD-10-CM | POA: Diagnosis present

## 2016-01-24 LAB — CBC
HCT: 33.1 % — ABNORMAL LOW (ref 40.0–52.0)
Hemoglobin: 10.9 g/dL — ABNORMAL LOW (ref 13.0–18.0)
MCH: 27.8 pg (ref 26.0–34.0)
MCHC: 33 g/dL (ref 32.0–36.0)
MCV: 84.3 fL (ref 80.0–100.0)
PLATELETS: 210 10*3/uL (ref 150–440)
RBC: 3.92 MIL/uL — AB (ref 4.40–5.90)
RDW: 17.2 % — AB (ref 11.5–14.5)
WBC: 10.5 10*3/uL (ref 3.8–10.6)

## 2016-01-24 LAB — BASIC METABOLIC PANEL
Anion gap: 18 — ABNORMAL HIGH (ref 5–15)
BUN: 106 mg/dL — AB (ref 6–20)
CALCIUM: 8.5 mg/dL — AB (ref 8.9–10.3)
CHLORIDE: 99 mmol/L — AB (ref 101–111)
CO2: 19 mmol/L — ABNORMAL LOW (ref 22–32)
CREATININE: 8.96 mg/dL — AB (ref 0.61–1.24)
GFR calc non Af Amer: 6 mL/min — ABNORMAL LOW (ref >60)
GFR, EST AFRICAN AMERICAN: 8 mL/min — AB (ref >60)
Glucose, Bld: 270 mg/dL — ABNORMAL HIGH (ref 65–99)
Potassium: 6.6 mmol/L (ref 3.5–5.1)
SODIUM: 136 mmol/L (ref 135–145)

## 2016-01-24 LAB — BRAIN NATRIURETIC PEPTIDE: B Natriuretic Peptide: 3282 pg/mL — ABNORMAL HIGH (ref 0.0–100.0)

## 2016-01-24 LAB — TROPONIN I: TROPONIN I: 0.04 ng/mL — AB (ref <0.031)

## 2016-01-24 MED ORDER — METHYLPREDNISOLONE SODIUM SUCC 125 MG IJ SOLR
125.0000 mg | Freq: Once | INTRAMUSCULAR | Status: AC
  Administered 2016-01-24: 125 mg via INTRAVENOUS

## 2016-01-24 MED ORDER — IPRATROPIUM-ALBUTEROL 0.5-2.5 (3) MG/3ML IN SOLN
RESPIRATORY_TRACT | Status: AC
  Administered 2016-01-24: 3 mL via RESPIRATORY_TRACT
  Filled 2016-01-24: qty 6

## 2016-01-24 MED ORDER — CALCIUM GLUCONATE 10 % IV SOLN
1.0000 g | Freq: Once | INTRAVENOUS | Status: AC
  Administered 2016-01-24: 1 g via INTRAVENOUS
  Filled 2016-01-24: qty 10

## 2016-01-24 MED ORDER — DEXTROSE 50 % IV SOLN
1.0000 | Freq: Once | INTRAVENOUS | Status: AC
  Administered 2016-01-24: 50 mL via INTRAVENOUS
  Filled 2016-01-24: qty 50

## 2016-01-24 MED ORDER — METHYLPREDNISOLONE SODIUM SUCC 125 MG IJ SOLR
INTRAMUSCULAR | Status: AC
  Administered 2016-01-24: 125 mg via INTRAVENOUS
  Filled 2016-01-24: qty 2

## 2016-01-24 MED ORDER — INSULIN ASPART 100 UNIT/ML ~~LOC~~ SOLN
10.0000 [IU] | Freq: Once | SUBCUTANEOUS | Status: AC
  Administered 2016-01-24: 10 [IU] via INTRAVENOUS
  Filled 2016-01-24: qty 10

## 2016-01-24 MED ORDER — SODIUM POLYSTYRENE SULFONATE 15 GM/60ML PO SUSP: 30.0000 g | Freq: Once | ORAL | Status: DC

## 2016-01-24 MED ORDER — IPRATROPIUM-ALBUTEROL 0.5-2.5 (3) MG/3ML IN SOLN
3.0000 mL | Freq: Once | RESPIRATORY_TRACT | Status: AC
  Administered 2016-01-24: 3 mL via RESPIRATORY_TRACT

## 2016-01-24 MED ORDER — SODIUM BICARBONATE 8.4 % IV SOLN
50.0000 meq | Freq: Once | INTRAVENOUS | Status: AC
  Administered 2016-01-24: 50 meq via INTRAVENOUS
  Filled 2016-01-24: qty 50

## 2016-01-24 NOTE — ED Provider Notes (Signed)
[facility] Emergency Department Provider Note   ____________________________________________  Time seen:  I have reviewed the triage vital signs and the triage nursing note.  HISTORY  Chief Complaint Shortness of Breath   Historian EMS report Sister at bedside Patient is limited himself as a bit of a poor historian  HPI Tanner Campbell is a 40 y.o. male with a history of diabetes, hypertension, COPD on 5 L home O2, as well as chronic renal failure on hemodialysis Tuesday, Thursday, and Saturdays, is here for worsening shortness of breath for 1-2 days. Patient is a little confused and the history is given by the sister at bedside who cares for him. He had dialysis on Thursday and they state too much fluid was pulled off and so he was feeling bad and decided not to go for dialysis on Saturday. On Tuesday his transportation was unable to get him to dialysis and he started having increased trouble breathing. Denies fevers. Denies productive sputum.  He was admitted in the hospital about a month ago with similar symptoms where he was treated for COPD exacerbation, possible pneumonia sepsis, and ultimately felt to be hypoxic respiratory failure due to pulmonary edema from acute on chronic renal failure requiring dialysis in the hospital.  Sr. states that today she thinks a little confused. He has not had any fevers. Symptoms are moderate.   Past Medical History  Diagnosis Date  . Diabetes mellitus without complication (HCC)   . Hypertension   . COPD (chronic obstructive pulmonary disease) (HCC)   . Renal disorder   . Neuropathy (HCC)   . Hemodialysis patient (HCC)   . High cholesterol     Patient Active Problem List   Diagnosis Date Noted  . Accelerated hypertension 12/22/2015  . Flash pulmonary edema (HCC) 12/22/2015  . Sepsis (HCC) 12/22/2015  . ESRD on dialysis (HCC) 12/22/2015  . Type 2 diabetes mellitus (HCC) 12/22/2015  . COPD (chronic obstructive  pulmonary disease) (HCC) 12/22/2015    Past Surgical History  Procedure Laterality Date  . Cholecystectomy    . Back surgery    . Neck surgery      Current Outpatient Rx  Name  Route  Sig  Dispense  Refill  . albuterol (PROVENTIL HFA;VENTOLIN HFA) 108 (90 Base) MCG/ACT inhaler   Inhalation   Inhale 1-2 puffs into the lungs every 6 (six) hours as needed for wheezing or shortness of breath.         Marland Kitchen amLODipine (NORVASC) 5 MG tablet   Oral   Take 5 mg by mouth daily.         Marland Kitchen atorvastatin (LIPITOR) 40 MG tablet   Oral   Take 40 mg by mouth every evening.         . calcium acetate (PHOSLO) 667 MG capsule   Oral   Take 2,001 mg by mouth 3 (three) times daily with meals.         . carvedilol (COREG) 25 MG tablet   Oral   Take 25 mg by mouth 2 (two) times daily with a meal.         . DULoxetine (CYMBALTA) 30 MG capsule   Oral   Take 30 mg by mouth daily.         . Ferric Citrate (AURYXIA PO)   Oral   Take by mouth.         . folic acid-vitamin b complex-vitamin c-selenium-zinc (DIALYVITE) 3 MG TABS tablet   Oral   Take 1  tablet by mouth daily.         . furosemide (LASIX) 80 MG tablet   Oral   Take 80 mg by mouth 2 (two) times daily.         Marland Kitchen gabapentin (NEURONTIN) 300 MG capsule   Oral   Take 300 mg by mouth at bedtime.         Marland Kitchen lisinopril (PRINIVIL,ZESTRIL) 20 MG tablet   Oral   Take 20 mg by mouth every evening.         . risperiDONE (RISPERDAL) 0.5 MG tablet   Oral   Take 0.5 mg by mouth at bedtime.           Allergies Penicillins  History reviewed. No pertinent family history.  Social History Social History  Substance Use Topics  . Smoking status: Current Every Day Smoker  . Smokeless tobacco: None  . Alcohol Use: No    Review of Systems  Constitutional: Negative for fever. Eyes: Negative for visual changes. ENT: Negative for sore throat. Cardiovascular: Negative for chest pain. Respiratory: Positive for  shortness of breath. Gastrointestinal: Negative for abdominal pain, vomiting and diarrhea. Genitourinary: Does not make urine. Musculoskeletal: Negative for back pain. Skin: Negative for rash. Neurological: Negative for headache. 10 point Review of Systems otherwise negative ____________________________________________   PHYSICAL EXAM:  VITAL SIGNS: ED Triage Vitals  Enc Vitals Group     BP 01/24/16 2200 127/80 mmHg     Pulse Rate 01/24/16 2119 95     Resp 01/24/16 2119 25     Temp --      Temp src --      SpO2 01/24/16 2119 90 %     Weight 01/24/16 2119 160 lb (72.576 kg)     Height 01/24/16 2119 5\' 5"  (1.651 m)     Head Cir --      Peak Flow --      Pain Score --      Pain Loc --      Pain Edu? --      Excl. in GC? --      Constitutional:A little agitated but redirectable. Tachypnea with mild respiratory distress. HEENT   Head: Normocephalic and atraumatic.      Eyes: Conjunctivae are normal. PERRL. Normal extraocular movements.      Ears:         Nose: No congestion/rhinnorhea. Wearing nasal cannula   Mouth/Throat: Mucous membranes are moist.   Neck: No stridor. Cardiovascular/Chest: Normal rate, regular rhythm.  No murmurs, rubs, or gallops. Respiratory: Tachypnea with mild retractions. Rhonchi throughout all fields especially posteriorly. Mild End expiratory wheezing Gastrointestinal: Soft. No distention, no guarding, no rebound. Nontender.  Genitourinary/rectal:Deferred Musculoskeletal: Nontender with normal range of motion in all extremities. No joint effusions.  No lower extremity tenderness.  No edema. Neurologic:  No slurred speech. Her strength. No gross or focal neurologic deficits are appreciated. Skin:  Skin is warm, dry and intact. No rash noted. Psychiatric: No hallucinations  ____________________________________________   EKG I, Governor Rooks, MD, the attending physician have personally viewed and interpreted all ECGs.  93 bpm. Normal  sinus rhythm. Normal axis. Nonspecific T wave. T-wave inverted in V5 and V6. No peaked T waves. ____________________________________________  LABS (pertinent positives/negatives)  Labs Reviewed  BASIC METABOLIC PANEL - Abnormal; Notable for the following:    Potassium 6.6 (*)    Chloride 99 (*)    CO2 19 (*)    Glucose, Bld 270 (*)    BUN 106 (*)  Creatinine, Ser 8.96 (*)    Calcium 8.5 (*)    GFR calc non Af Amer 6 (*)    GFR calc Af Amer 8 (*)    Anion gap 18 (*)    All other components within normal limits  CBC - Abnormal; Notable for the following:    RBC 3.92 (*)    Hemoglobin 10.9 (*)    HCT 33.1 (*)    RDW 17.2 (*)    All other components within normal limits  TROPONIN I - Abnormal; Notable for the following:    Troponin I 0.04 (*)    All other components within normal limits  BRAIN NATRIURETIC PEPTIDE    ____________________________________________  RADIOLOGY All Xrays were viewed by me. Imaging interpreted by Radiologist.  Chest: IMPRESSION: No significant interval change in bilateral pleural effusions and bilateral mid to lower lung field opacities. __________________________________________  PROCEDURES  Procedure(s) performed: None  Critical Care performed: CRITICAL CARE Performed by: Governor Rooks   Total critical care time: 30 minutes  Critical care time was exclusive of separately billable procedures and treating other patients.  Critical care was necessary to treat or prevent imminent or life-threatening deterioration.  Critical care was time spent personally by me on the following activities: development of treatment plan with patient and/or surrogate as well as nursing, discussions with consultants, evaluation of patient's response to treatment, examination of patient, obtaining history from patient or surrogate, ordering and performing treatments and interventions, ordering and review of laboratory studies, ordering and review of  radiographic studies, pulse oximetry and re-evaluation of patient's condition.   ____________________________________________   ED COURSE / ASSESSMENT AND PLAN  Pertinent labs & imaging results that were available during my care of the patient were reviewed by me and considered in my medical decision making (see chart for details).   Patient's here after missing dialysis on Saturday and Tuesday. His last dialysis was Thursday. He has a history of COPD and wheezing and was given DuoNeb upon arrival with wheezing with Solu-Medrol. However upon obtaining the rest of the history of likely volume overload, I think volume overload with his acute on chronic renal failure is the source of his worsening hypoxia and shortness of breath.  He is on 5 liters nasal cannula at home. Here he is 6 L and 87-93%.  History of chest x-ray shows no interval change in the bilateral pleural effusions and lower lung field opacities, from about a month ago he was admitted with pulmonary edema in a very similar incident. He does not have an elevated white blood cell count, productive cough, or fever, and so at this point I'm not treating for infection/pneumonia.  I spoke with on-call nephrologist, Dr. Sherlyn Lick, who recommended that without EKG changes with the hyperkalemia, patient may be given temporizing measures and admitted to the hospitalist for dialysis in the morning.  The hyperkalemia patient was given IV calcium, bicarbonate, insulin and glucose. He was also given lactulose.   CONSULTATIONS:  Nephrology by phone. Hospitalist for admission.  Patient / Family / Caregiver informed of clinical course, medical decision-making process, and agree with plan.    ___________________________________________   FINAL CLINICAL IMPRESSION(S) / ED DIAGNOSES   Final diagnoses:  Acute on chronic renal failure (HCC)  Acute respiratory failure with hypoxia (HCC)              Note: This dictation was  prepared with Dragon dictation. Any transcriptional errors that result from this process are unintentional   Governor Rooks, MD  01/24/16 2308 

## 2016-01-24 NOTE — ED Notes (Signed)
Pt fninshed breathing treatment and put back on

## 2016-01-24 NOTE — ED Notes (Signed)
Per [facility], patient comes from home with c/o SOB. Hx of COPD, renal failure and degenerative bone disease. Patient is a smoker. Wears O2 all the time at home. States his compressor has been messing up lately. Rhonchi noted to bilateral lobes. Patient denies pain. A&O x4. RA SpO2 80%.

## 2016-01-24 NOTE — ED Notes (Addendum)
Patient requesting to leave. Patient states, "You have 15 minutes." Patients sister states he is confused. States that she walked out to use the restroom and when she came back patient didn't realize she had even been in the room. Dr. Shaune Pollack notified and is at the bedside.

## 2016-01-25 ENCOUNTER — Encounter: Payer: Self-pay | Admitting: Internal Medicine

## 2016-01-25 DIAGNOSIS — I319 Disease of pericardium, unspecified: Secondary | ICD-10-CM | POA: Diagnosis not present

## 2016-01-25 DIAGNOSIS — L7622 Postprocedural hemorrhage and hematoma of skin and subcutaneous tissue following other procedure: Secondary | ICD-10-CM | POA: Diagnosis not present

## 2016-01-25 DIAGNOSIS — N179 Acute kidney failure, unspecified: Secondary | ICD-10-CM | POA: Diagnosis not present

## 2016-01-25 DIAGNOSIS — F17200 Nicotine dependence, unspecified, uncomplicated: Secondary | ICD-10-CM | POA: Diagnosis present

## 2016-01-25 DIAGNOSIS — D638 Anemia in other chronic diseases classified elsewhere: Secondary | ICD-10-CM | POA: Diagnosis not present

## 2016-01-25 DIAGNOSIS — R1310 Dysphagia, unspecified: Secondary | ICD-10-CM | POA: Diagnosis not present

## 2016-01-25 DIAGNOSIS — E875 Hyperkalemia: Secondary | ICD-10-CM

## 2016-01-25 DIAGNOSIS — A419 Sepsis, unspecified organism: Secondary | ICD-10-CM | POA: Diagnosis not present

## 2016-01-25 DIAGNOSIS — R012 Other cardiac sounds: Secondary | ICD-10-CM | POA: Diagnosis not present

## 2016-01-25 DIAGNOSIS — J441 Chronic obstructive pulmonary disease with (acute) exacerbation: Secondary | ICD-10-CM | POA: Diagnosis present

## 2016-01-25 DIAGNOSIS — D631 Anemia in chronic kidney disease: Secondary | ICD-10-CM | POA: Diagnosis present

## 2016-01-25 DIAGNOSIS — I5023 Acute on chronic systolic (congestive) heart failure: Secondary | ICD-10-CM | POA: Diagnosis not present

## 2016-01-25 DIAGNOSIS — R0600 Dyspnea, unspecified: Secondary | ICD-10-CM | POA: Diagnosis present

## 2016-01-25 DIAGNOSIS — Z79899 Other long term (current) drug therapy: Secondary | ICD-10-CM | POA: Diagnosis not present

## 2016-01-25 DIAGNOSIS — I5021 Acute systolic (congestive) heart failure: Secondary | ICD-10-CM | POA: Diagnosis not present

## 2016-01-25 DIAGNOSIS — J189 Pneumonia, unspecified organism: Secondary | ICD-10-CM | POA: Diagnosis not present

## 2016-01-25 DIAGNOSIS — B963 Hemophilus influenzae [H. influenzae] as the cause of diseases classified elsewhere: Secondary | ICD-10-CM | POA: Diagnosis present

## 2016-01-25 DIAGNOSIS — Z880 Allergy status to penicillin: Secondary | ICD-10-CM | POA: Diagnosis not present

## 2016-01-25 DIAGNOSIS — J9600 Acute respiratory failure, unspecified whether with hypoxia or hypercapnia: Secondary | ICD-10-CM | POA: Diagnosis not present

## 2016-01-25 DIAGNOSIS — J9621 Acute and chronic respiratory failure with hypoxia: Secondary | ICD-10-CM | POA: Diagnosis not present

## 2016-01-25 DIAGNOSIS — Y844 Aspiration of fluid as the cause of abnormal reaction of the patient, or of later complication, without mention of misadventure at the time of the procedure: Secondary | ICD-10-CM | POA: Diagnosis not present

## 2016-01-25 DIAGNOSIS — J9601 Acute respiratory failure with hypoxia: Secondary | ICD-10-CM | POA: Diagnosis not present

## 2016-01-25 DIAGNOSIS — N186 End stage renal disease: Secondary | ICD-10-CM | POA: Diagnosis not present

## 2016-01-25 DIAGNOSIS — Z9115 Patient's noncompliance with renal dialysis: Secondary | ICD-10-CM | POA: Diagnosis not present

## 2016-01-25 DIAGNOSIS — R7989 Other specified abnormal findings of blood chemistry: Secondary | ICD-10-CM | POA: Diagnosis not present

## 2016-01-25 DIAGNOSIS — R509 Fever, unspecified: Secondary | ICD-10-CM | POA: Diagnosis not present

## 2016-01-25 DIAGNOSIS — N189 Chronic kidney disease, unspecified: Secondary | ICD-10-CM | POA: Diagnosis not present

## 2016-01-25 DIAGNOSIS — G35 Multiple sclerosis: Secondary | ICD-10-CM | POA: Diagnosis present

## 2016-01-25 DIAGNOSIS — Z9981 Dependence on supplemental oxygen: Secondary | ICD-10-CM | POA: Diagnosis not present

## 2016-01-25 DIAGNOSIS — R0902 Hypoxemia: Secondary | ICD-10-CM

## 2016-01-25 DIAGNOSIS — Z992 Dependence on renal dialysis: Secondary | ICD-10-CM | POA: Diagnosis not present

## 2016-01-25 DIAGNOSIS — Z930 Tracheostomy status: Secondary | ICD-10-CM | POA: Diagnosis not present

## 2016-01-25 DIAGNOSIS — E872 Acidosis: Secondary | ICD-10-CM | POA: Diagnosis present

## 2016-01-25 DIAGNOSIS — I214 Non-ST elevation (NSTEMI) myocardial infarction: Secondary | ICD-10-CM | POA: Diagnosis not present

## 2016-01-25 DIAGNOSIS — E785 Hyperlipidemia, unspecified: Secondary | ICD-10-CM | POA: Diagnosis present

## 2016-01-25 DIAGNOSIS — J811 Chronic pulmonary edema: Secondary | ICD-10-CM | POA: Diagnosis present

## 2016-01-25 DIAGNOSIS — J9622 Acute and chronic respiratory failure with hypercapnia: Secondary | ICD-10-CM | POA: Diagnosis not present

## 2016-01-25 DIAGNOSIS — J440 Chronic obstructive pulmonary disease with acute lower respiratory infection: Secondary | ICD-10-CM | POA: Diagnosis present

## 2016-01-25 DIAGNOSIS — G9340 Encephalopathy, unspecified: Secondary | ICD-10-CM | POA: Diagnosis present

## 2016-01-25 DIAGNOSIS — N2581 Secondary hyperparathyroidism of renal origin: Secondary | ICD-10-CM | POA: Diagnosis present

## 2016-01-25 DIAGNOSIS — I248 Other forms of acute ischemic heart disease: Secondary | ICD-10-CM | POA: Diagnosis not present

## 2016-01-25 DIAGNOSIS — R6520 Severe sepsis without septic shock: Secondary | ICD-10-CM | POA: Diagnosis not present

## 2016-01-25 DIAGNOSIS — Y92239 Unspecified place in hospital as the place of occurrence of the external cause: Secondary | ICD-10-CM | POA: Diagnosis not present

## 2016-01-25 DIAGNOSIS — Z9911 Dependence on respirator [ventilator] status: Secondary | ICD-10-CM | POA: Diagnosis present

## 2016-01-25 DIAGNOSIS — I120 Hypertensive chronic kidney disease with stage 5 chronic kidney disease or end stage renal disease: Secondary | ICD-10-CM | POA: Diagnosis present

## 2016-01-25 DIAGNOSIS — D696 Thrombocytopenia, unspecified: Secondary | ICD-10-CM | POA: Diagnosis present

## 2016-01-25 DIAGNOSIS — K567 Ileus, unspecified: Secondary | ICD-10-CM | POA: Diagnosis not present

## 2016-01-25 DIAGNOSIS — I420 Dilated cardiomyopathy: Secondary | ICD-10-CM | POA: Diagnosis not present

## 2016-01-25 DIAGNOSIS — E7800 Pure hypercholesterolemia, unspecified: Secondary | ICD-10-CM | POA: Diagnosis present

## 2016-01-25 DIAGNOSIS — E1140 Type 2 diabetes mellitus with diabetic neuropathy, unspecified: Secondary | ICD-10-CM | POA: Diagnosis present

## 2016-01-25 DIAGNOSIS — R7881 Bacteremia: Secondary | ICD-10-CM | POA: Diagnosis not present

## 2016-01-25 DIAGNOSIS — J90 Pleural effusion, not elsewhere classified: Secondary | ICD-10-CM | POA: Diagnosis present

## 2016-01-25 DIAGNOSIS — J9620 Acute and chronic respiratory failure, unspecified whether with hypoxia or hypercapnia: Secondary | ICD-10-CM | POA: Diagnosis not present

## 2016-01-25 DIAGNOSIS — R197 Diarrhea, unspecified: Secondary | ICD-10-CM | POA: Diagnosis not present

## 2016-01-25 DIAGNOSIS — E43 Unspecified severe protein-calorie malnutrition: Secondary | ICD-10-CM | POA: Diagnosis not present

## 2016-01-25 DIAGNOSIS — E876 Hypokalemia: Secondary | ICD-10-CM | POA: Diagnosis not present

## 2016-01-25 DIAGNOSIS — I10 Essential (primary) hypertension: Secondary | ICD-10-CM | POA: Diagnosis not present

## 2016-01-25 DIAGNOSIS — I351 Nonrheumatic aortic (valve) insufficiency: Secondary | ICD-10-CM | POA: Diagnosis not present

## 2016-01-25 DIAGNOSIS — D649 Anemia, unspecified: Secondary | ICD-10-CM | POA: Diagnosis not present

## 2016-01-25 DIAGNOSIS — E1122 Type 2 diabetes mellitus with diabetic chronic kidney disease: Secondary | ICD-10-CM | POA: Diagnosis present

## 2016-01-25 LAB — BLOOD GAS, ARTERIAL
ACID-BASE EXCESS: 0.7 mmol/L (ref 0.0–3.0)
ALLENS TEST (PASS/FAIL): POSITIVE — AB
Acid-base deficit: 6.3 mmol/L — ABNORMAL HIGH (ref 0.0–2.0)
Acid-base deficit: 8.1 mmol/L — ABNORMAL HIGH (ref 0.0–2.0)
Allens test (pass/fail): POSITIVE — AB
Allens test (pass/fail): POSITIVE — AB
Bicarbonate: 21.4 mEq/L (ref 21.0–28.0)
Bicarbonate: 19.9 mEq/L — ABNORMAL LOW (ref 21.0–28.0)
Bicarbonate: 27.7 mEq/L (ref 21.0–28.0)
DELIVERY SYSTEMS: POSITIVE
Delivery systems: POSITIVE
Delivery systems: POSITIVE
EXPIRATORY PAP: 6
Expiratory PAP: 5
Expiratory PAP: 5
FIO2: 0.4
FIO2: 0.4
FIO2: 40
INSPIRATORY PAP: 10
INSPIRATORY PAP: 10
Inspiratory PAP: 16
LHR: 10 {breaths}/min
LHR: 10 {breaths}/min
O2 Saturation: 90.8 %
O2 Saturation: 76.2 %
O2 Saturation: 84.3 %
PCO2 ART: 51 mmHg — AB (ref 32.0–48.0)
PCO2 ART: 55 mmHg — AB (ref 32.0–48.0)
PH ART: 7.31 — AB (ref 7.350–7.450)
PO2 ART: 51 mmHg — AB (ref 83.0–108.0)
Patient temperature: 37
Patient temperature: 37
Patient temperature: 37
pCO2 arterial: 51 mmHg — ABNORMAL HIGH (ref 32.0–48.0)
pH, Arterial: 7.23 — ABNORMAL LOW (ref 7.350–7.450)
pH, Arterial: 7.2 — ABNORMAL LOW (ref 7.350–7.450)
pO2, Arterial: 72 mmHg — ABNORMAL LOW (ref 83.0–108.0)
pO2, Arterial: 54 mmHg — ABNORMAL LOW (ref 83.0–108.0)

## 2016-01-25 LAB — BASIC METABOLIC PANEL
Anion gap: 18 — ABNORMAL HIGH (ref 5–15)
BUN: 106 mg/dL — AB (ref 6–20)
CO2: 21 mmol/L — ABNORMAL LOW (ref 22–32)
CREATININE: 9.11 mg/dL — AB (ref 0.61–1.24)
Calcium: 8.5 mg/dL — ABNORMAL LOW (ref 8.9–10.3)
Chloride: 99 mmol/L — ABNORMAL LOW (ref 101–111)
GFR calc Af Amer: 7 mL/min — ABNORMAL LOW (ref >60)
GFR, EST NON AFRICAN AMERICAN: 6 mL/min — AB (ref >60)
GLUCOSE: 313 mg/dL — AB (ref 65–99)
POTASSIUM: 5.7 mmol/L — AB (ref 3.5–5.1)
Sodium: 138 mmol/L (ref 135–145)

## 2016-01-25 LAB — CBC
HEMATOCRIT: 33.8 % — AB (ref 40.0–52.0)
Hemoglobin: 11.1 g/dL — ABNORMAL LOW (ref 13.0–18.0)
MCH: 28.2 pg (ref 26.0–34.0)
MCHC: 32.8 g/dL (ref 32.0–36.0)
MCV: 86.1 fL (ref 80.0–100.0)
Platelets: 187 10*3/uL (ref 150–440)
RBC: 3.92 MIL/uL — ABNORMAL LOW (ref 4.40–5.90)
RDW: 17.1 % — AB (ref 11.5–14.5)
WBC: 10.5 10*3/uL (ref 3.8–10.6)

## 2016-01-25 LAB — GLUCOSE, CAPILLARY
GLUCOSE-CAPILLARY: 241 mg/dL — AB (ref 65–99)
GLUCOSE-CAPILLARY: 234 mg/dL — AB (ref 65–99)
GLUCOSE-CAPILLARY: 277 mg/dL — AB (ref 65–99)
GLUCOSE-CAPILLARY: 203 mg/dL — AB (ref 65–99)

## 2016-01-25 LAB — MRSA PCR SCREENING: MRSA by PCR: NEGATIVE

## 2016-01-25 LAB — HEMOGLOBIN A1C: Hgb A1c MFr Bld: 8.2 % — ABNORMAL HIGH (ref 4.0–6.0)

## 2016-01-25 MED ORDER — ALBUTEROL SULFATE (2.5 MG/3ML) 0.083% IN NEBU: 3.0000 mL | INHALATION_SOLUTION | Freq: Four times a day (QID) | RESPIRATORY_TRACT | Status: DC | PRN

## 2016-01-25 MED ORDER — SODIUM CHLORIDE 0.9 % IV SOLN: 250.0000 mL | INTRAVENOUS | Status: DC | PRN

## 2016-01-25 MED ORDER — HEPARIN SODIUM (PORCINE) 5000 UNIT/ML IJ SOLN
5000.0000 [IU] | Freq: Three times a day (TID) | INTRAMUSCULAR | Status: DC
  Administered 2016-01-25 – 2016-02-01 (×22): 5000 [IU] via SUBCUTANEOUS
  Filled 2016-01-25 (×22): qty 1

## 2016-01-25 MED ORDER — EPOETIN ALFA 10000 UNIT/ML IJ SOLN
10000.0000 [IU] | INTRAMUSCULAR | Status: DC
  Administered 2016-01-25 – 2016-01-27 (×2): 10000 [IU] via INTRAVENOUS
  Filled 2016-01-25: qty 1

## 2016-01-25 MED ORDER — FUROSEMIDE 10 MG/ML IJ SOLN
80.0000 mg | Freq: Once | INTRAMUSCULAR | Status: AC
  Administered 2016-01-25: 80 mg via INTRAVENOUS

## 2016-01-25 MED ORDER — ATORVASTATIN CALCIUM 20 MG PO TABS
40.0000 mg | ORAL_TABLET | Freq: Every evening | ORAL | Status: DC
  Administered 2016-01-26 – 2016-02-01 (×7): 40 mg via ORAL
  Filled 2016-01-25 (×7): qty 2

## 2016-01-25 MED ORDER — RISPERIDONE 1 MG PO TABS: 0.5000 mg | ORAL_TABLET | Freq: Every day | ORAL | Status: DC

## 2016-01-25 MED ORDER — FERRIC CITRATE 1 GM 210 MG(FE) PO TABS: ORAL_TABLET | Freq: Every day | ORAL | Status: DC

## 2016-01-25 MED ORDER — AMLODIPINE BESYLATE 5 MG PO TABS: 5.0000 mg | ORAL_TABLET | Freq: Every day | ORAL | Status: DC

## 2016-01-25 MED ORDER — CALCIUM ACETATE (PHOS BINDER) 667 MG PO CAPS
2001.0000 mg | ORAL_CAPSULE | Freq: Three times a day (TID) | ORAL | Status: DC
  Administered 2016-01-25 – 2016-01-29 (×10): 2001 mg via ORAL
  Filled 2016-01-25 (×5): qty 3
  Filled 2016-01-25: qty 2
  Filled 2016-01-25: qty 1
  Filled 2016-01-25 (×6): qty 3

## 2016-01-25 MED ORDER — DIAZEPAM 5 MG/ML IJ SOLN
INTRAMUSCULAR | Status: AC
  Administered 2016-01-25: 5 mg via INTRAVENOUS
  Filled 2016-01-25: qty 2

## 2016-01-25 MED ORDER — SODIUM CHLORIDE 0.9% FLUSH: 3.0000 mL | INTRAVENOUS | Status: DC | PRN

## 2016-01-25 MED ORDER — ONDANSETRON HCL 4 MG/2ML IJ SOLN: 4.0000 mg | Freq: Four times a day (QID) | INTRAMUSCULAR | Status: DC | PRN

## 2016-01-25 MED ORDER — ACETAMINOPHEN 650 MG RE SUPP: 650.0000 mg | Freq: Four times a day (QID) | RECTAL | Status: DC | PRN

## 2016-01-25 MED ORDER — RENA-VITE PO TABS
1.0000 | ORAL_TABLET | Freq: Every day | ORAL | Status: DC
  Administered 2016-01-25 – 2016-02-01 (×7): 1 via ORAL
  Filled 2016-01-25 (×7): qty 1

## 2016-01-25 MED ORDER — SENNOSIDES-DOCUSATE SODIUM 8.6-50 MG PO TABS
1.0000 | ORAL_TABLET | Freq: Every evening | ORAL | Status: DC | PRN
Start: 2016-01-25 — End: 2016-02-01
  Administered 2016-01-28: 1 via ORAL
  Filled 2016-01-25: qty 1

## 2016-01-25 MED ORDER — FUROSEMIDE 10 MG/ML IJ SOLN
INTRAMUSCULAR | Status: AC
  Filled 2016-01-25: qty 8

## 2016-01-25 MED ORDER — FUROSEMIDE 40 MG PO TABS
80.0000 mg | ORAL_TABLET | Freq: Two times a day (BID) | ORAL | Status: DC
  Filled 2016-01-25: qty 2

## 2016-01-25 MED ORDER — BUDESONIDE 0.5 MG/2ML IN SUSP
0.5000 mg | Freq: Two times a day (BID) | RESPIRATORY_TRACT | Status: DC
  Administered 2016-01-25 – 2016-02-01 (×16): 0.5 mg via RESPIRATORY_TRACT
  Filled 2016-01-25 (×16): qty 2

## 2016-01-25 MED ORDER — ACETAMINOPHEN 325 MG PO TABS
650.0000 mg | ORAL_TABLET | Freq: Four times a day (QID) | ORAL | Status: DC | PRN
  Administered 2016-01-27: 650 mg via ORAL
  Filled 2016-01-25: qty 2

## 2016-01-25 MED ORDER — ONDANSETRON HCL 4 MG PO TABS: 4.0000 mg | ORAL_TABLET | Freq: Four times a day (QID) | ORAL | Status: DC | PRN

## 2016-01-25 MED ORDER — IPRATROPIUM-ALBUTEROL 0.5-2.5 (3) MG/3ML IN SOLN
3.0000 mL | RESPIRATORY_TRACT | Status: DC
  Administered 2016-01-25 – 2016-02-01 (×45): 3 mL via RESPIRATORY_TRACT
  Filled 2016-01-25 (×45): qty 3

## 2016-01-25 MED ORDER — CETYLPYRIDINIUM CHLORIDE 0.05 % MT LIQD
7.0000 mL | Freq: Two times a day (BID) | OROMUCOSAL | Status: DC
  Administered 2016-01-25 (×3): 7 mL via OROMUCOSAL

## 2016-01-25 MED ORDER — SODIUM CHLORIDE 0.9% FLUSH
3.0000 mL | Freq: Two times a day (BID) | INTRAVENOUS | Status: DC
  Administered 2016-01-25 – 2016-01-31 (×13): 3 mL via INTRAVENOUS

## 2016-01-25 MED ORDER — MORPHINE SULFATE (PF) 2 MG/ML IV SOLN
2.0000 mg | INTRAVENOUS | Status: AC
  Administered 2016-01-25: 2 mg via INTRAVENOUS
  Filled 2016-01-25: qty 1

## 2016-01-25 MED ORDER — DULOXETINE HCL 30 MG PO CPEP
30.0000 mg | ORAL_CAPSULE | Freq: Every day | ORAL | Status: DC
  Administered 2016-01-25 – 2016-02-01 (×8): 30 mg via ORAL
  Filled 2016-01-25 (×8): qty 1

## 2016-01-25 MED ORDER — INSULIN ASPART 100 UNIT/ML ~~LOC~~ SOLN
0.0000 [IU] | SUBCUTANEOUS | Status: DC
  Administered 2016-01-25: 5 [IU] via SUBCUTANEOUS
  Administered 2016-01-25: 8 [IU] via SUBCUTANEOUS
  Filled 2016-01-25: qty 5
  Filled 2016-01-25: qty 8

## 2016-01-25 MED ORDER — SODIUM CHLORIDE 0.9% FLUSH
3.0000 mL | Freq: Two times a day (BID) | INTRAVENOUS | Status: DC
  Administered 2016-01-25 – 2016-01-28 (×6): 3 mL via INTRAVENOUS

## 2016-01-25 MED ORDER — CARVEDILOL 25 MG PO TABS
25.0000 mg | ORAL_TABLET | Freq: Two times a day (BID) | ORAL | Status: DC
Start: 2016-01-25 — End: 2016-01-26
  Administered 2016-01-25: 25 mg via ORAL
  Filled 2016-01-25: qty 1

## 2016-01-25 MED ORDER — DIAZEPAM 5 MG/ML IJ SOLN
5.0000 mg | INTRAMUSCULAR | Status: AC
  Administered 2016-01-25: 5 mg via INTRAVENOUS

## 2016-01-25 NOTE — Progress Notes (Signed)
Patient is a 40 year old admitted with acute respiratory failure 40 year old male patient with history of COPD on home oxygen, end-stage renal disease on dialysis, hypertension presented to the emergency room with difficulty breathing. Admitting diagnosis 1. Acute hypoxic respiratory failure continue BiPAP appreciate pulmonary input may need intubated  2. Volume overload hemodialysis this morning 3. Hyperkalemia 4. Stage renal disease on dialysis 5. COPD on home oxygen

## 2016-01-25 NOTE — Progress Notes (Signed)
PRE HD   

## 2016-01-25 NOTE — Progress Notes (Signed)
CCMD notified this RN at 1417 that all patient leads were off.  Enroute to patient's room lab techs informed RN that patient fell and was in floor.  When RN entered room patient was on right side of bed sitting on floor with back and shoulders leaning against the bed.  Patient alert to self and place but confused to time.  Patient denied pain and denied hitting his head.  Stool on floor and on patient.  Patient stated "i had to go to the bathroom."  This RN and [place], [place] along with Asher Muir, orderly assisted patient to standing and back to bed.  Patient was cleaned up by staff and linens changed.  This RN notified Dr. Allena Katz at [phone] that patient fell out of bed and did not hit his head and denies pain, VSS and alert to person and place.  Dr. Allena Katz acknowledged and gave no new orders.

## 2016-01-25 NOTE — Progress Notes (Signed)
Attempted to get telephone consent from patient's sister due to patient's altered mental status but patient's sister did not pick up. Left message and notified MD, Dr. Wynelle Link, about inability to fill out consent for dialysis. MD ordered dialysis as emergent.

## 2016-01-25 NOTE — Progress Notes (Signed)
Patient became agitated during dialysis and was pulling at dialysis needs, bipap mask, and chest leads. Unable to calm verbally. Patient confused and not speaking with staff. RN spoke with Dr. Belia Heman in person and MD ordered 2 mg of morphine IV once to try and calm patient. Patient calmed after morphine.

## 2016-01-25 NOTE — Plan of Care (Signed)
Problem: Safety: Goal: Ability to remain free from injury will improve Outcome: Not Progressing Patient now with sitter at bedside due to confusion and fall earlier in shift. Patient with very lethargic to obtunded at beginning of shift, more awake but after agitated after dialysis, around 16:00 alert and calm but confused. Bipap reapplied due to lethargy and patient appearing to sleep.

## 2016-01-25 NOTE — H&P (Signed)
[facility] [place] [facility]   PATIENT NAME: Tanner Campbell    MR#:  [id number]  DATE OF BIRTH:  September 10, 1975  DATE OF ADMISSION:  01/24/2016  PRIMARY CARE PHYSICIAN: No primary care provider on file.   REQUESTING/REFERRING PHYSICIAN:   CHIEF COMPLAINT:   Chief Complaint  Patient presents with  . Shortness of Breath    HISTORY OF PRESENT ILLNESS: Tanner Campbell  is a 40 y.o. male with a known history of End-stage renal disease on dialysis, COPD on home oxygen neuropathy, hyperlipidemia, hypertension, diabetes mellitus type 2 presented to the emergency room with increased shortness of breath since last Thursday. Patient gets dialysis on Tuesday and Thursday and Saturday. No history of any fever or chills. Patient was evaluated in the emergency room was found to be in volume overload. Chest x-ray showed bilateral pleural effusions. Patient's potassium level was also high. No history of fall or head injury. Patient is comfortable Lanoxin by nasal cannula at file later in the emergency room. Hospitalist service was consulted for further care of the patient. No history of recent travel or sick contacts at home.  PAST MEDICAL HISTORY:   Past Medical History  Diagnosis Date  . Diabetes mellitus without complication (HCC)   . Hypertension   . COPD (chronic obstructive pulmonary disease) (HCC)   . Renal disorder   . Neuropathy (HCC)   . Hemodialysis patient (HCC)   . High cholesterol     PAST SURGICAL HISTORY: Past Surgical History  Procedure Laterality Date  . Cholecystectomy    . Back surgery    . Neck surgery      SOCIAL HISTORY:  Social History  Substance Use Topics  . Smoking status: Current Every Day Smoker  . Smokeless tobacco: Not on file  . Alcohol Use: No    FAMILY HISTORY:  Family History  Problem Relation Age of Onset  . Rheum arthritis Neg Hx   . Osteoarthritis Neg Hx   . Asthma Neg Hx   . Cancer Neg Hx   . Diabetes Neg Hx      DRUG ALLERGIES:  Allergies  Allergen Reactions  . Penicillins Other (See Comments)    Reaction: Unknown    REVIEW OF SYSTEMS:   CONSTITUTIONAL: No fever, has weakness.  EYES: No blurred or double vision.  EARS, NOSE, AND THROAT: No tinnitus or ear pain.  RESPIRATORY: No cough, has shortness of breath, no wheezing or hemoptysis.  CARDIOVASCULAR: No chest pain, orthopnea, edema.  GASTROINTESTINAL: No nausea, vomiting, diarrhea or abdominal pain.  GENITOURINARY: No dysuria, hematuria.  ENDOCRINE: No polyuria, nocturia,  HEMATOLOGY: No anemia, easy bruising or bleeding SKIN: No rash or lesion. MUSCULOSKELETAL: No joint pain or arthritis.   NEUROLOGIC: No tingling, numbness, weakness.  PSYCHIATRY: No anxiety or depression.   MEDICATIONS AT HOME:  Prior to Admission medications   Medication Sig Start Date End Date Taking? Authorizing Provider  albuterol (PROVENTIL HFA;VENTOLIN HFA) 108 (90 Base) MCG/ACT inhaler Inhale 1-2 puffs into the lungs every 6 (six) hours as needed for wheezing or shortness of breath.   Yes Historical Provider, MD  amLODipine (NORVASC) 5 MG tablet Take 5 mg by mouth daily.   Yes Historical Provider, MD  atorvastatin (LIPITOR) 40 MG tablet Take 40 mg by mouth every evening.   Yes Historical Provider, MD  calcium acetate (PHOSLO) 667 MG capsule Take 2,001 mg by mouth 3 (three) times daily with meals.   Yes Historical Provider, MD  carvedilol (COREG) 25 MG  tablet Take 25 mg by mouth 2 (two) times daily with a meal.   Yes Historical Provider, MD  DULoxetine (CYMBALTA) 30 MG capsule Take 30 mg by mouth daily.   Yes Historical Provider, MD  Ferric Citrate (AURYXIA PO) Take 1 tablet by mouth daily.    Yes Historical Provider, MD  folic acid-vitamin b complex-vitamin c-selenium-zinc (DIALYVITE) 3 MG TABS tablet Take 1 tablet by mouth daily.   Yes Historical Provider, MD  furosemide (LASIX) 80 MG tablet Take 80 mg by mouth 2 (two) times daily.   Yes Historical  Provider, MD  gabapentin (NEURONTIN) 300 MG capsule Take 300 mg by mouth at bedtime.   Yes Historical Provider, MD  lisinopril (PRINIVIL,ZESTRIL) 20 MG tablet Take 20 mg by mouth every evening.   Yes Historical Provider, MD  risperiDONE (RISPERDAL) 0.5 MG tablet Take 0.5 mg by mouth at bedtime.   Yes Historical Provider, MD      PHYSICAL EXAMINATION:   VITAL SIGNS: Blood pressure 153/91, pulse 98, resp. rate 12, height 5\' 5"  (1.651 m), weight 72.576 kg (160 lb), SpO2 90 %.  GENERAL:  40 y.o.-year-old patient lying in the bed in mild distress.  EYES: Pupils equal, round, reactive to light and accommodation. No scleral icterus. Extraocular muscles intact.  HEENT: Head atraumatic, normocephalic. Oropharynx and nasopharynx clear.  NECK:  Supple, no jugular venous distention. No thyroid enlargement, no tenderness.  LUNGS: Decreased breath sounds bilaterally, bibasilar crepitations heard. No use of accessory muscles of respiration.  CARDIOVASCULAR: S1, S2 normal. No murmurs, rubs, or gallops.  ABDOMEN: Soft, nontender, nondistended. Bowel sounds present. No organomegaly or mass.  EXTREMITIES: No pedal edema, cyanosis, or clubbing.  NEUROLOGIC: Cranial nerves II through XII are intact. Muscle strength 5/5 in all extremities. Sensation intact. Gait not checked.  PSYCHIATRIC: The patient is alert and oriented x 3.  SKIN: No obvious rash, lesion, or ulcer.   LABORATORY PANEL:   CBC  Recent Labs Lab 01/24/16 2125  WBC 10.5  HGB 10.9*  HCT 33.1*  PLT 210  MCV 84.3  MCH 27.8  MCHC 33.0  RDW 17.2*   ------------------------------------------------------------------------------------------------------------------  Chemistries   Recent Labs Lab 01/24/16 2125  NA 136  K 6.6*  CL 99*  CO2 19*  GLUCOSE 270*  BUN 106*  CREATININE 8.96*  CALCIUM 8.5*   ------------------------------------------------------------------------------------------------------------------ estimated  creatinine clearance is 9.4 mL/min (by C-G formula based on Cr of 8.96). ------------------------------------------------------------------------------------------------------------------ No results for input(s): TSH, T4TOTAL, T3FREE, THYROIDAB in the last 72 hours.  Invalid input(s): FREET3   Coagulation profile No results for input(s): INR, PROTIME in the last 168 hours. ------------------------------------------------------------------------------------------------------------------- No results for input(s): DDIMER in the last 72 hours. -------------------------------------------------------------------------------------------------------------------  Cardiac Enzymes  Recent Labs Lab 01/24/16 2125  TROPONINI 0.04*   ------------------------------------------------------------------------------------------------------------------ Invalid input(s): POCBNP  ---------------------------------------------------------------------------------------------------------------  Urinalysis No results found for: COLORURINE, APPEARANCEUR, LABSPEC, PHURINE, GLUCOSEU, HGBUR, BILIRUBINUR, KETONESUR, PROTEINUR, UROBILINOGEN, NITRITE, LEUKOCYTESUR   RADIOLOGY: Dg Chest Portable 1 View  01/24/2016  CLINICAL DATA:  40 year old male with shortness of breath. EXAM: PORTABLE CHEST 1 VIEW COMPARISON:  Chest radiograph dated 12/24/2015 FINDINGS: There is stable bilateral pleural effusions and the bilateral mid to lower lung field airspace opacities compatible with atelectasis/ infiltrate. There is no pneumothorax. Stable cardiac silhouette. No acute osseous pathology. IMPRESSION: No significant interval change in bilateral pleural effusions and bilateral mid to lower lung field opacities. Electronically Signed   By: Elgie Collard M.D.   On: 01/24/2016 22:05    EKG: Orders placed or performed  during the hospital encounter of 01/24/16  . ED EKG  . ED EKG    IMPRESSION AND PLAN: 40 year old male  patient with history of COPD on home oxygen, end-stage renal disease on dialysis, hypertension presented to the emergency room with difficulty breathing. Admitting diagnosis 1. Acute hypoxic respiratory failure 2. Volume overload 3. Hyperkalemia 4. Stage renal disease on dialysis 5. COPD on home oxygen Treatment plan : Admit patient to ICU stepdown unit Continue oxygen via nasal cannula Patient received IV calcium gluconate, IV insulin, oral Kayexalate and IV bicarbonate for hyperkalemia Nephrology consultation for dialysis Renal diet DVT prophylaxis subcutaneous heparin.  All the records are reviewed and case discussed with ED provider. Management plans discussed with the patient, family and they are in agreement.  CODE STATUS:FULL Code Status History    Date Active Date Inactive Code Status Order ID Comments User Context   12/22/2015 11:23 PM 12/24/2015  3:03 PM Full Code [id number]  Oralia Manis, MD ED       TOTAL TIME TAKING CARE OF THIS PATIENT: 55 minutes.    Ihor Austin M.D on 01/25/2016 at 1:13 AM  Between 7am to 6pm - Pager - [phone]  After 6pm go to [email or web address] - password [email or web address] [facility]  [facility] [place] [facility]  Office  [phone]  CC: Primary care physician; No primary care provider on file.

## 2016-01-25 NOTE — Progress Notes (Signed)
TX ended.    

## 2016-01-25 NOTE — Progress Notes (Signed)
eLink Physician-Brief Progress Note Patient Name: Tanner Campbell DOB: February 28, 1976 MRN: [id number]   Date of Service  01/25/2016  HPI/Events of Note  Elink New patient eval: 40 yo M with ESRD on HD, now with volume overload, CHF exacerbation, and hypoxic respiratory failure  eICU Interventions  Patient with volume overload on CXR; lasix 80mg  IV x 1 BNP>3000, plan on HD in the AM Check ABG, start Bipap  Hyperkalemia - being shifted with insulin and D50 PCCM to follow on consults.      Intervention Category Major Interventions: Respiratory failure - evaluation and management  Carlisle Enke 01/25/2016, 2:25 AM

## 2016-01-25 NOTE — Consult Note (Signed)
[facility] Pulmonary Medicine Consultation      Name: Tanner Campbell MRN: [id number] DOB: May 30, 1976    ADMISSION DATE:  01/24/2016   REFERRING MD :  Dr. pyreddy   CHIEF COMPLAINT:   Acute resp failure   HISTORY OF PRESENT ILLNESS  40 yo male with multiple medical issues including chronic resp failure on chronic oxygen therapy Admitted to ICU for elevated K along with acute resp failure  Patient on biPAP, not arousable, high risk for intubation     PAST MEDICAL HISTORY    :  Past Medical History  Diagnosis Date  . Diabetes mellitus without complication (HCC)   . Hypertension   . COPD (chronic obstructive pulmonary disease) (HCC)   . Renal disorder   . Neuropathy (HCC)   . Hemodialysis patient (HCC)   . High cholesterol    Past Surgical History  Procedure Laterality Date  . Cholecystectomy    . Back surgery    . Neck surgery     Prior to Admission medications   Medication Sig Start Date End Date Taking? Authorizing Provider  albuterol (PROVENTIL HFA;VENTOLIN HFA) 108 (90 Base) MCG/ACT inhaler Inhale 1-2 puffs into the lungs every 6 (six) hours as needed for wheezing or shortness of breath.   Yes Historical Provider, MD  amLODipine (NORVASC) 5 MG tablet Take 5 mg by mouth daily.   Yes Historical Provider, MD  atorvastatin (LIPITOR) 40 MG tablet Take 40 mg by mouth every evening.   Yes Historical Provider, MD  calcium acetate (PHOSLO) 667 MG capsule Take 2,001 mg by mouth 3 (three) times daily with meals.   Yes Historical Provider, MD  carvedilol (COREG) 25 MG tablet Take 25 mg by mouth 2 (two) times daily with a meal.   Yes Historical Provider, MD  DULoxetine (CYMBALTA) 30 MG capsule Take 30 mg by mouth daily.   Yes Historical Provider, MD  Ferric Citrate (AURYXIA PO) Take 1 tablet by mouth daily.    Yes Historical Provider, MD  folic acid-vitamin b complex-vitamin c-selenium-zinc (DIALYVITE) 3 MG TABS tablet Take 1 tablet by mouth daily.   Yes Historical  Provider, MD  furosemide (LASIX) 80 MG tablet Take 80 mg by mouth 2 (two) times daily.   Yes Historical Provider, MD  gabapentin (NEURONTIN) 300 MG capsule Take 300 mg by mouth at bedtime.   Yes Historical Provider, MD  lisinopril (PRINIVIL,ZESTRIL) 20 MG tablet Take 20 mg by mouth every evening.   Yes Historical Provider, MD  risperiDONE (RISPERDAL) 0.5 MG tablet Take 0.5 mg by mouth at bedtime.   Yes Historical Provider, MD   Allergies  Allergen Reactions  . Penicillins Other (See Comments)    Reaction: Unknown     FAMILY HISTORY   Family History  Problem Relation Age of Onset  . Rheum arthritis Neg Hx   . Osteoarthritis Neg Hx   . Asthma Neg Hx   . Cancer Neg Hx   . Diabetes Neg Hx       SOCIAL HISTORY    reports that he has been smoking.  He does not have any smokeless tobacco history on file. He reports that he does not drink alcohol or use illicit drugs.  Review of Systems  Unable to perform ROS: critical illness      VITAL SIGNS    Temp:  [98 F (36.7 C)-98.1 F (36.7 C)] 98 F (36.7 C) (06/22 0400) Pulse Rate:  [88-99] 94 (06/22 0600) Resp:  [10-26] 12 (06/22 0600) BP: (127-153)/(77-91) 147/84 mmHg (  06/22 0600) SpO2:  [90 %-95 %] 93 % (06/22 0600) FiO2 (%):  [40 %] 40 % (06/22 0400) Weight:  [160 lb (72.576 kg)] 160 lb (72.576 kg) (06/21 2119) HEMODYNAMICS:   VENTILATOR SETTINGS: Vent Mode:  [-]  FiO2 (%):  [40 %] 40 % INTAKE / OUTPUT:  Intake/Output Summary (Last 24 hours) at 01/25/16 0741 Last data filed at 01/25/16 0230  Gross per 24 hour  Intake      3 ml  Output      0 ml  Net      3 ml       PHYSICAL EXAM   Physical Exam  Constitutional: He appears distressed.  HENT:  Head: Normocephalic and atraumatic.  Eyes: Pupils are equal, round, and reactive to light. No scleral icterus.  Neck: Neck supple.  Cardiovascular: Normal rate and regular rhythm.   No murmur heard. Pulmonary/Chest: He is in respiratory distress. He has no  wheezes. He has rales.  resp distress  Abdominal: Soft. Bowel sounds are normal.  Musculoskeletal: He exhibits no edema.  Neurological: He displays normal reflexes. Coordination normal.  lethargic  Skin: Skin is warm. No rash noted. He is diaphoretic.       LABS   LABS:  CBC  Recent Labs Lab 01/24/16 2125 01/25/16 0432  WBC 10.5 10.5  HGB 10.9* 11.1*  HCT 33.1* 33.8*  PLT 210 187   Coag's No results for input(s): APTT, INR in the last 168 hours. BMET  Recent Labs Lab 01/24/16 2125 01/25/16 0432  NA 136 138  K 6.6* 5.7*  CL 99* 99*  CO2 19* 21*  BUN 106* 106*  CREATININE 8.96* 9.11*  GLUCOSE 270* 313*   Electrolytes  Recent Labs Lab 01/24/16 2125 01/25/16 0432  CALCIUM 8.5* 8.5*   Sepsis Markers No results for input(s): LATICACIDVEN, PROCALCITON, O2SATVEN in the last 168 hours. ABG  Recent Labs Lab 01/25/16 0248  PHART 7.23*  PCO2ART 51*  PO2ART 72*   Liver Enzymes No results for input(s): AST, ALT, ALKPHOS, BILITOT, ALBUMIN in the last 168 hours. Cardiac Enzymes  Recent Labs Lab 01/24/16 2125  TROPONINI 0.04*   Glucose  Recent Labs Lab 01/25/16 0224  GLUCAP 241*     Recent Results (from the past 240 hour(s))  MRSA PCR Screening     Status: None   Collection Time: 01/25/16  2:22 AM  Result Value Ref Range Status   MRSA by PCR NEGATIVE NEGATIVE Final    Comment:        The GeneXpert MRSA Assay (FDA approved for NASAL specimens only), is one component of a comprehensive MRSA colonization surveillance program. It is not intended to diagnose MRSA infection nor to guide or monitor treatment for MRSA infections.      Current facility-administered medications:  .  0.9 %  sodium chloride infusion, 250 mL, Intravenous, PRN, Ihor Austin, MD .  acetaminophen (TYLENOL) tablet 650 mg, 650 mg, Oral, Q6H PRN **OR** acetaminophen (TYLENOL) suppository 650 mg, 650 mg, Rectal, Q6H PRN, Pavan Pyreddy, MD .  albuterol (PROVENTIL) (2.5  MG/3ML) 0.083% nebulizer solution 3 mL, 3 mL, Inhalation, Q6H PRN, Pavan Pyreddy, MD .  amLODipine (NORVASC) tablet 5 mg, 5 mg, Oral, Daily, Pavan Pyreddy, MD .  antiseptic oral rinse (CPC / CETYLPYRIDINIUM CHLORIDE 0.05%) solution 7 mL, 7 mL, Mouth Rinse, BID, Pavan Pyreddy, MD, 7 mL at 01/25/16 0345 .  atorvastatin (LIPITOR) tablet 40 mg, 40 mg, Oral, QPM, Pavan Pyreddy, MD .  calcium acetate (PHOSLO) capsule 2,001  mg, 2,001 mg, Oral, TID WC, Pavan Pyreddy, MD .  carvedilol (COREG) tablet 25 mg, 25 mg, Oral, BID WC, Pavan Pyreddy, MD .  DULoxetine (CYMBALTA) DR capsule 30 mg, 30 mg, Oral, Daily, Pavan Pyreddy, MD .  Ferric Citrate TABS, , Oral, Daily, Pavan Pyreddy, MD .  furosemide (LASIX) tablet 80 mg, 80 mg, Oral, BID, Pavan Pyreddy, MD .  heparin injection 5,000 Units, 5,000 Units, Subcutaneous, Q8H, Pavan Pyreddy, MD .  multivitamin (RENA-VIT) tablet 1 tablet, 1 tablet, Oral, Daily, Pavan Pyreddy, MD .  ondansetron (ZOFRAN) tablet 4 mg, 4 mg, Oral, Q6H PRN **OR** ondansetron (ZOFRAN) injection 4 mg, 4 mg, Intravenous, Q6H PRN, Pavan Pyreddy, MD .  risperiDONE (RISPERDAL) tablet 0.5 mg, 0.5 mg, Oral, QHS, Pavan Pyreddy, MD .  senna-docusate (Senokot-S) tablet 1 tablet, 1 tablet, Oral, QHS PRN, Pavan Pyreddy, MD .  sodium chloride flush (NS) 0.9 % injection 3 mL, 3 mL, Intravenous, Q12H, Pavan Pyreddy, MD, 3 mL at 01/25/16 0230 .  sodium chloride flush (NS) 0.9 % injection 3 mL, 3 mL, Intravenous, Q12H, Pavan Pyreddy, MD, 0 mL at 01/25/16 0230 .  sodium chloride flush (NS) 0.9 % injection 3 mL, 3 mL, Intravenous, PRN, Pavan Pyreddy, MD .  sodium polystyrene (KAYEXALATE) 15 GM/60ML suspension 30 g, 30 g, Oral, Once, Governor Rooks, MD, 30 g at 01/24/16 2300  IMAGING    Dg Chest Portable 1 View  01/24/2016  CLINICAL DATA:  40 year old male with shortness of breath. EXAM: PORTABLE CHEST 1 VIEW COMPARISON:  Chest radiograph dated 12/24/2015 FINDINGS: There is stable bilateral pleural effusions  and the bilateral mid to lower lung field airspace opacities compatible with atelectasis/ infiltrate. There is no pneumothorax. Stable cardiac silhouette. No acute osseous pathology. IMPRESSION: No significant interval change in bilateral pleural effusions and bilateral mid to lower lung field opacities. Electronically Signed   By: Elgie Collard M.D.   On: 01/24/2016 22:05     ASSESSMENT/PLAN  40 yo male with acute on chronic resp failure from pulm edema with elevated K with ESRD on HD  PULMONARY 1.Respiratory Failure -cont biPAP therapy -repeat ABG pending -high risk for intubation   CARDIOVASCULAR Needs ICU monitoring -cont bp meds  RENAL Emergent HD per nephrology  GASTROINTESTINAL Keep NPO-high risk for aspiration  HEMATOLOGIC Follow h/h  INFECTIOUS No infection at this time  ENDOCRINE - ICU hypoglycemic\Hyperglycemia protocol   NEUROLOGIC Check ABG and assess for resp acidosis     I have personally obtained a history, examined the patient, evaluated laboratory and independently reviewed  imaging results, formulated the assessment and plan and placed orders.  The Patient requires high complexity decision making for assessment and support, frequent evaluation and titration of therapies, application of advanced monitoring technologies and extensive interpretation of multiple databases. Critical Care Time devoted to patient care services described in this note is 45 minutes.   Overall, patient is critically ill, prognosis is guarded. Patient at high risk for cardiac arrest and death.    Lucie Leather, M.D.  [facility]  Medical Director [facility] Medical Director [facility] Cardio-Pulmonary Department

## 2016-01-25 NOTE — Progress Notes (Signed)
Patient became agitated again and again was attempting to remove medical equipment. Unable to calm verbally, patient still confused and not speaking or following commands. RN spoke to Dr. Belia Heman on the phone and MD gave order for 5 mg of valium IV once to try and calm patient again. Patient calmed by valium.

## 2016-01-25 NOTE — Progress Notes (Signed)
Notified patient's sister about patient's fall and general condition, bursts of agitation but resting now, no complaints of pain, alert to self and place but otherwise confused (as had been since dialysis finished), off bipap now per RT recommendation but per RT O2 sat goal 92% and patient will have to go back on Bipap when asleep. Patient's sister acknowledged and her questions were answered, she was concerned for patient but pleasant and glad patient is able to be off Bipap and eat and is less confused than before.

## 2016-01-25 NOTE — Progress Notes (Signed)
Pre Hd 

## 2016-01-25 NOTE — Progress Notes (Signed)
[facility]  ROUNDING NOTE   Subjective:   Admitted with respiratory failure. Placed on BiPap. Found to have pulmonary edema and hyperkalemia. He missed his last three outpatient dialysis treatments.   Objective:  Vital signs in last 24 hours:  Temp:  [96.9 F (36.1 C)-98.1 F (36.7 C)] 96.9 F (36.1 C) (06/22 0830) Pulse Rate:  [88-99] 96 (06/22 0830) Resp:  [10-26] 16 (06/22 0830) BP: (127-153)/(75-91) 145/75 mmHg (06/22 0830) SpO2:  [90 %-95 %] 94 % (06/22 0830) FiO2 (%):  [40 %] 40 % (06/22 0830) Weight:  [72.576 kg (160 lb)-73.2 kg (161 lb 6 oz)] 73.2 kg (161 lb 6 oz) (06/22 0215)  Weight change:  Filed Weights   01/24/16 2119 01/25/16 0215  Weight: 72.576 kg (160 lb) 73.2 kg (161 lb 6 oz)    Intake/Output: I/O last 3 completed shifts: In: 3 [I.V.:3] Out: -    Intake/Output this shift:     Physical Exam: General: Critically ill  Head: Normocephalic, atraumatic. +BiPap  Eyes: Anicteric, PERRL  Neck: Supple, trachea midline  Lungs:  Bilateral crackles   Heart: Regular rate and rhythm  Abdomen:  Soft, nontender  Extremities: trace peripheral edema.  Neurologic: Nonfocal, moving all four extremities  Skin: No lesions  Access: Left arm AVF    Basic Metabolic Panel:  Recent Labs Lab 01/24/16 2125 01/25/16 0432  NA 136 138  K 6.6* 5.7*  CL 99* 99*  CO2 19* 21*  GLUCOSE 270* 313*  BUN 106* 106*  CREATININE 8.96* 9.11*  CALCIUM 8.5* 8.5*    Liver Function Tests: No results for input(s): AST, ALT, ALKPHOS, BILITOT, PROT, ALBUMIN in the last 168 hours. No results for input(s): LIPASE, AMYLASE in the last 168 hours. No results for input(s): AMMONIA in the last 168 hours.  CBC:  Recent Labs Lab 01/24/16 2125 01/25/16 0432  WBC 10.5 10.5  HGB 10.9* 11.1*  HCT 33.1* 33.8*  MCV 84.3 86.1  PLT 210 187    Cardiac Enzymes:  Recent Labs Lab 01/24/16 2125  TROPONINI 0.04*    BNP: Invalid input(s): POCBNP  CBG:  Recent  Labs Lab 01/25/16 0224  GLUCAP 241*    Microbiology: Results for orders placed or performed during the hospital encounter of 01/24/16  MRSA PCR Screening     Status: None   Collection Time: 01/25/16  2:22 AM  Result Value Ref Range Status   MRSA by PCR NEGATIVE NEGATIVE Final    Comment:        The GeneXpert MRSA Assay (FDA approved for NASAL specimens only), is one component of a comprehensive MRSA colonization surveillance program. It is not intended to diagnose MRSA infection nor to guide or monitor treatment for MRSA infections.     Coagulation Studies: No results for input(s): LABPROT, INR in the last 72 hours.  Urinalysis: No results for input(s): COLORURINE, LABSPEC, PHURINE, GLUCOSEU, HGBUR, BILIRUBINUR, KETONESUR, PROTEINUR, UROBILINOGEN, NITRITE, LEUKOCYTESUR in the last 72 hours.  Invalid input(s): APPERANCEUR    Imaging: Dg Chest Portable 1 View  01/24/2016  CLINICAL DATA:  40 year old male with shortness of breath. EXAM: PORTABLE CHEST 1 VIEW COMPARISON:  Chest radiograph dated 12/24/2015 FINDINGS: There is stable bilateral pleural effusions and the bilateral mid to lower lung field airspace opacities compatible with atelectasis/ infiltrate. There is no pneumothorax. Stable cardiac silhouette. No acute osseous pathology. IMPRESSION: No significant interval change in bilateral pleural effusions and bilateral mid to lower lung field opacities. Electronically Signed   By: Ceasar Mons.D.  On: 01/24/2016 22:05     Medications:     . amLODipine  5 mg Oral Daily  . antiseptic oral rinse  7 mL Mouth Rinse BID  . atorvastatin  40 mg Oral QPM  . calcium acetate  2,001 mg Oral TID WC  . carvedilol  25 mg Oral BID WC  . DULoxetine  30 mg Oral Daily  . epoetin (EPOGEN/PROCRIT) injection  10,000 Units Intravenous Q T,Th,Sa-HD  . Ferric Citrate   Oral Daily  . furosemide  80 mg Oral BID  . heparin  5,000 Units Subcutaneous Q8H  . multivitamin  1 tablet Oral  Daily  . risperiDONE  0.5 mg Oral QHS  . sodium chloride flush  3 mL Intravenous Q12H  . sodium chloride flush  3 mL Intravenous Q12H  . sodium polystyrene  30 g Oral Once   sodium chloride, acetaminophen **OR** acetaminophen, albuterol, ondansetron **OR** ondansetron (ZOFRAN) IV, senna-docusate, sodium chloride flush  Assessment/ Plan:  [name] is a 40 y.o. white male with End-stage renal disease with hemodialysis Started hemodialysis May 02, 2014, Hypertension, Diabetes mellitus type 2, hyperlipidemia, Multiple sclerosis, COPD, Back surgery, Cholecystectomy, Tobacco use   TTS [name]. left arm AVF  1. End Stage Renal Disease: with pulmonary edema and hyperkalemia. TTS Missed last three outpatient treatments - emergent hemodialysis now. - Continue TTS schedule.   2. Hypertension: blood pressure decent control - home regimen of amlodipine, carvedilol, furosemide and lisinopril.   3. Anemia of chronic kidney disease: hemoglobin at goal - hold epo.   4. Secondary Hyperparathyroidism: with hyperphosphatemia:  - calcium acetate with meals.    LOS: 0 Tanner Campbell 6/22/20179:22 AM

## 2016-01-25 NOTE — Progress Notes (Signed)
Post HD  

## 2016-01-25 NOTE — Progress Notes (Signed)
This RN made Maralyn Sago, RN aware that patient fell and that MD was notified.

## 2016-01-25 NOTE — Progress Notes (Signed)
HD TX started  

## 2016-01-26 ENCOUNTER — Inpatient Hospital Stay: Payer: Medicare Other

## 2016-01-26 ENCOUNTER — Encounter: Payer: Self-pay | Admitting: Adult Health

## 2016-01-26 DIAGNOSIS — N179 Acute kidney failure, unspecified: Secondary | ICD-10-CM | POA: Insufficient documentation

## 2016-01-26 DIAGNOSIS — N189 Chronic kidney disease, unspecified: Secondary | ICD-10-CM

## 2016-01-26 DIAGNOSIS — J9620 Acute and chronic respiratory failure, unspecified whether with hypoxia or hypercapnia: Secondary | ICD-10-CM

## 2016-01-26 LAB — BLOOD GAS, ARTERIAL
ALLENS TEST (PASS/FAIL): POSITIVE — AB
Acid-base deficit: 0.6 mmol/L (ref 0.0–2.0)
Acid-base deficit: 0.7 mmol/L (ref 0.0–2.0)
Allens test (pass/fail): POSITIVE — AB
BICARBONATE: 25.8 meq/L (ref 21.0–28.0)
Bicarbonate: 27.9 mEq/L (ref 21.0–28.0)
EXPIRATORY PAP: 6
FIO2: 40
FIO2: 40
INSPIRATORY PAP: 16
LHR: 15 {breaths}/min
MECHANICAL RATE: 15
MECHVT: 500 mL
Mechanical Rate: 10
O2 SAT: 93.1 %
O2 Saturation: 97.4 %
PATIENT TEMPERATURE: 37
PCO2 ART: 65 mmHg — AB (ref 32.0–48.0)
PCO2 ART: 50 mmHg — AB (ref 32.0–48.0)
PEEP: 5 cmH2O
PH ART: 7.24 — AB (ref 7.350–7.450)
PO2 ART: 79 mmHg — AB (ref 83.0–108.0)
PO2 ART: 102 mmHg (ref 83.0–108.0)
Patient temperature: 37
RATE: 10 resp/min
pH, Arterial: 7.32 — ABNORMAL LOW (ref 7.350–7.450)

## 2016-01-26 LAB — PHOSPHORUS
Phosphorus: 6.8 mg/dL — ABNORMAL HIGH (ref 2.5–4.6)
Phosphorus: 3.7 mg/dL (ref 2.5–4.6)

## 2016-01-26 LAB — HEPATITIS B SURFACE ANTIGEN: Hepatitis B Surface Ag: NEGATIVE

## 2016-01-26 LAB — HEPATITIS B SURFACE ANTIBODY, QUANTITATIVE: Hepatitis B-Post: 23.9 m[IU]/mL (ref >9.9)

## 2016-01-26 LAB — GLUCOSE, CAPILLARY
GLUCOSE-CAPILLARY: 102 mg/dL — AB (ref 65–99)
GLUCOSE-CAPILLARY: 120 mg/dL — AB (ref 65–99)
GLUCOSE-CAPILLARY: 123 mg/dL — AB (ref 65–99)
GLUCOSE-CAPILLARY: 85 mg/dL (ref 65–99)
GLUCOSE-CAPILLARY: 86 mg/dL (ref 65–99)
GLUCOSE-CAPILLARY: 88 mg/dL (ref 65–99)
Glucose-Capillary: 104 mg/dL — ABNORMAL HIGH (ref 65–99)

## 2016-01-26 LAB — TRIGLYCERIDES: TRIGLYCERIDES: 93 mg/dL (ref <150)

## 2016-01-26 LAB — MAGNESIUM
MAGNESIUM: 2 mg/dL (ref 1.7–2.4)
Magnesium: 2.5 mg/dL — ABNORMAL HIGH (ref 1.7–2.4)

## 2016-01-26 MED ORDER — VITAL HIGH PROTEIN PO LIQD
1000.0000 mL | ORAL | Status: DC
  Administered 2016-01-27: 1000 mL
  Administered 2016-01-28 (×2)
  Administered 2016-01-28: 1000 mL
  Administered 2016-01-28 (×2)
  Administered 2016-01-28: 1000 mL
  Administered 2016-01-28 – 2016-01-29 (×10)
  Administered 2016-01-29: 1000 mL
  Administered 2016-01-30: 18:00:00
  Administered 2016-01-30 – 2016-01-31 (×5): 1000 mL

## 2016-01-26 MED ORDER — MIDAZOLAM HCL 2 MG/2ML IJ SOLN
4.0000 mg | Freq: Once | INTRAMUSCULAR | Status: AC
  Administered 2016-01-26: 4 mg via INTRAVENOUS

## 2016-01-26 MED ORDER — FENTANYL 2500MCG IN NS 250ML (10MCG/ML) PREMIX INFUSION
INTRAVENOUS | Status: AC
  Administered 2016-01-26: 50 ug/h via INTRAVENOUS
  Filled 2016-01-26: qty 250

## 2016-01-26 MED ORDER — PROPOFOL 1000 MG/100ML IV EMUL
0.0000 ug/kg/min | INTRAVENOUS | Status: DC
  Administered 2016-01-26: 5 ug/kg/min via INTRAVENOUS

## 2016-01-26 MED ORDER — CETYLPYRIDINIUM CHLORIDE 0.05 % MT LIQD: 7.0000 mL | Freq: Two times a day (BID) | OROMUCOSAL | Status: DC

## 2016-01-26 MED ORDER — STERILE WATER FOR INJECTION IJ SOLN
10.0000 mL | Freq: Once | INTRAMUSCULAR | Status: AC
Start: 2016-01-26 — End: 2016-01-26
  Administered 2016-01-26: 10 mL via INTRAMUSCULAR

## 2016-01-26 MED ORDER — VECURONIUM BROMIDE 10 MG IV SOLR
10.0000 mg | Freq: Once | INTRAVENOUS | Status: AC
  Administered 2016-01-26: 10 mg via INTRAVENOUS

## 2016-01-26 MED ORDER — FENTANYL CITRATE (PF) 100 MCG/2ML IJ SOLN
50.0000 ug | Freq: Once | INTRAMUSCULAR | Status: AC
  Administered 2016-01-26: 50 ug via INTRAVENOUS

## 2016-01-26 MED ORDER — ALBUTEROL SULFATE (2.5 MG/3ML) 0.083% IN NEBU: 2.5000 mg | INHALATION_SOLUTION | RESPIRATORY_TRACT | Status: DC

## 2016-01-26 MED ORDER — PROPOFOL 1000 MG/100ML IV EMUL
INTRAVENOUS | Status: AC
  Filled 2016-01-26: qty 100

## 2016-01-26 MED ORDER — MIDAZOLAM HCL 2 MG/2ML IJ SOLN
INTRAMUSCULAR | Status: AC
  Administered 2016-01-26: 4 mg via INTRAVENOUS
  Filled 2016-01-26: qty 4

## 2016-01-26 MED ORDER — FENTANYL CITRATE (PF) 100 MCG/2ML IJ SOLN
INTRAMUSCULAR | Status: AC
  Administered 2016-01-26: 200 ug via INTRAVENOUS
  Filled 2016-01-26: qty 4

## 2016-01-26 MED ORDER — FENTANYL CITRATE (PF) 100 MCG/2ML IJ SOLN
200.0000 ug | Freq: Once | INTRAMUSCULAR | Status: AC
  Administered 2016-01-26: 200 ug via INTRAVENOUS

## 2016-01-26 MED ORDER — VITAL HIGH PROTEIN PO LIQD
1000.0000 mL | ORAL | Status: DC
  Administered 2016-01-26: 1000 mL

## 2016-01-26 MED ORDER — FENTANYL 2500MCG IN NS 250ML (10MCG/ML) PREMIX INFUSION
25.0000 ug/h | INTRAVENOUS | Status: DC
  Administered 2016-01-26: 50 ug/h via INTRAVENOUS
  Administered 2016-01-27 – 2016-01-28 (×3): 150 ug/h via INTRAVENOUS
  Administered 2016-01-29: 175 ug/h via INTRAVENOUS
  Filled 2016-01-26 (×4): qty 250

## 2016-01-26 MED ORDER — CHLORHEXIDINE GLUCONATE 0.12% ORAL RINSE (MEDLINE KIT)
15.0000 mL | Freq: Two times a day (BID) | OROMUCOSAL | Status: DC
  Administered 2016-01-26 – 2016-02-01 (×14): 15 mL via OROMUCOSAL
  Filled 2016-01-26 (×14): qty 15

## 2016-01-26 MED ORDER — ANTISEPTIC ORAL RINSE SOLUTION (CORINZ)
7.0000 mL | OROMUCOSAL | Status: DC
  Administered 2016-01-26 – 2016-02-01 (×60): 7 mL via OROMUCOSAL
  Filled 2016-01-26 (×70): qty 7

## 2016-01-26 MED ORDER — PRO-STAT SUGAR FREE PO LIQD
30.0000 mL | Freq: Two times a day (BID) | ORAL | Status: DC
  Administered 2016-01-26: 30 mL

## 2016-01-26 MED ORDER — VECURONIUM BROMIDE 10 MG IV SOLR
INTRAVENOUS | Status: AC
  Administered 2016-01-26: 10 mg via INTRAVENOUS
  Filled 2016-01-26: qty 10

## 2016-01-26 MED ORDER — MIDAZOLAM HCL 2 MG/2ML IJ SOLN
1.0000 mg | INTRAMUSCULAR | Status: DC | PRN
  Administered 2016-01-26: 2 mg via INTRAVENOUS
  Administered 2016-01-26: 4 mg via INTRAVENOUS
  Administered 2016-01-27 – 2016-01-29 (×4): 2 mg via INTRAVENOUS
  Filled 2016-01-26 (×5): qty 2
  Filled 2016-01-26: qty 4

## 2016-01-26 MED ORDER — STERILE WATER FOR INJECTION IJ SOLN
INTRAMUSCULAR | Status: AC
  Filled 2016-01-26: qty 10

## 2016-01-26 MED ORDER — FENTANYL BOLUS VIA INFUSION
50.0000 ug | INTRAVENOUS | Status: DC | PRN
  Administered 2016-01-26 – 2016-01-29 (×6): 50 ug via INTRAVENOUS
  Filled 2016-01-26: qty 50

## 2016-01-26 MED ORDER — CHLORHEXIDINE GLUCONATE 0.12 % MT SOLN
15.0000 mL | Freq: Two times a day (BID) | OROMUCOSAL | Status: DC
  Administered 2016-01-26: 15 mL via OROMUCOSAL

## 2016-01-26 MED ORDER — DOCUSATE SODIUM 50 MG/5ML PO LIQD
100.0000 mg | Freq: Two times a day (BID) | ORAL | Status: DC | PRN
  Administered 2016-01-28 (×2): 100 mg
  Filled 2016-01-26 (×2): qty 10

## 2016-01-26 MED ORDER — INSULIN ASPART 100 UNIT/ML ~~LOC~~ SOLN
2.0000 [IU] | SUBCUTANEOUS | Status: DC
  Administered 2016-01-26 – 2016-01-27 (×2): 2 [IU] via SUBCUTANEOUS
  Administered 2016-01-27: 100 [IU] via SUBCUTANEOUS
  Administered 2016-01-27: 2 [IU] via SUBCUTANEOUS
  Administered 2016-01-28 (×2): 4 [IU] via SUBCUTANEOUS
  Administered 2016-01-28: 6 [IU] via SUBCUTANEOUS
  Administered 2016-01-28 (×2): 4 [IU] via SUBCUTANEOUS
  Administered 2016-01-29: 6 [IU] via SUBCUTANEOUS
  Administered 2016-01-29: 4 [IU] via SUBCUTANEOUS
  Administered 2016-01-29: 6 [IU] via SUBCUTANEOUS
  Filled 2016-01-26: qty 4
  Filled 2016-01-26: qty 2
  Filled 2016-01-26: qty 4
  Filled 2016-01-26 (×2): qty 2
  Filled 2016-01-26: qty 4
  Filled 2016-01-26: qty 2
  Filled 2016-01-26 (×3): qty 6
  Filled 2016-01-26 (×2): qty 4

## 2016-01-26 NOTE — Progress Notes (Signed)
Post dialysis assessment 

## 2016-01-26 NOTE — Progress Notes (Signed)
PRE DIALYSIS ASSESSMENT 

## 2016-01-26 NOTE — Procedures (Signed)
Central Venous Catheter Placement: Indication: Patient receiving vesicant or irritant drug.; Patient receiving intravenous therapy for longer than 5 days.; Patient has limited or no vascular access.   Consent:emergent  Risks and benefits explained in detail including risk of infection, bleeding, respiratory failure and death..   Hand washing performed prior to starting the procedure.   Procedure: An active timeout was performed and correct patient, name, & ID confirmed.  After explaining risk and benefits, patient was positioned correctly for central venous access. Patient was prepped using strict sterile technique including chlorohexadine preps, sterile drape, sterile gown and sterile gloves.  The area was prepped, draped and anesthetized in the usual sterile manner. Patient comfort was obtained.  A triple lumen catheter was placed in RT Internal Jugular Vein, after attempting Left IJ. There was good blood return, catheter caps were placed on lumens, catheter flushed easily, the line was secured and a sterile dressing and BIO-PATCH applied.   Ultrasound was used to visualize vasculature and guidance of needle.   Number of Attempts: 1 Complications:none Estimated Blood Loss: none Chest Radiograph indicated and ordered.  Operator: Eland Lamantia.   Lucie Leather, M.D.  [facility]  Medical Director [facility] [place] Medical Director [facility] Cardio-Pulmonary Department

## 2016-01-26 NOTE — Clinical Documentation Improvement (Addendum)
Internal Medicine  Documentation of "Flash Pulmonary Edema" can be further specified by acuity ? Thank you     Acute Pulmonary Edema  Acute on Chronic Pulmonary Edema  Acute (Systolic/Diastolic) heart Failure  Acute on Chronic (Systolic/Diastolic) heart Failure  Other  Clinically Undetermined  Supporting Information: BNP 3282, ESRD, HTN   Documented In ER MD note and on Nephrology progress note 01/25/16  Please exercise your independent, professional judgment when responding. A specific answer is not anticipated or expected.   Thank You, Lavonda Jumbo Health Information Management [place] [phone]

## 2016-01-26 NOTE — Progress Notes (Signed)
Dialysis completed

## 2016-01-26 NOTE — Procedures (Signed)
Endotracheal Intubation: Patient required placement of an artificial airway secondary to resp failure.   Consent: Emergent.   Hand washing performed prior to starting the procedure.   Medications administered for sedation prior to procedure: Midazolam 2 mg IV,  Vecuronium 10 mg IV, Fentanyl 100 mcg IV.   Procedure: A time out procedure was called and correct patient, name, & ID confirmed. Needed supplies and equipment were assembled and checked to include ETT, 10 ml syringe, Glidescope, Mac and Miller blades, suction, oxygen and bag mask valve, end tidal CO2 monitor. Patient was positioned to align the mouth and pharynx to facilitate visualization of the glottis.  Heart rate, SpO2 and blood pressure was continuously monitored during the procedure. Pre-oxygenation was conducted prior to intubation and endotracheal tube was placed through the vocal cords into the trachea.  During intubation an assistant applied gentle pressure to the cricoid cartilage.   The artificial airway was placed under direct visualization via glidescope route using a 7.5 ETT on the first attempt.    ETT was secured at 23 cm mark.    Placement was confirmed by auscuitation of lungs with good breath sounds bilaterally and no stomach sounds.  Condensation was noted on endotracheal tube.  Pulse ox 100%.  CO2 detector in place with appropriate color change.   Complications: None .   Operator: Danialle Dement.   Chest radiograph ordered and pending.   Comments: OGT placed via glidescope.  Lucie Leather, M.D.  [facility]  Medical Director [facility] Medical Director [facility] Cardio-Pulmonary Department

## 2016-01-26 NOTE — Progress Notes (Signed)
Initial Nutrition Assessment  DOCUMENTATION CODES:   Not applicable  INTERVENTION:  -Dietitian Consult received via Adult Tube Feeding Protocol. Vital High Protein ordered at 40 ml/hr via PEPuP protoocol. Recommend new goal rate of 65 ml/hr providing 137 g of protein, 1560 kcals, 1310 mL of free water. New labs pending, will continue to assess. Recommend tube maintenance flushes only at this time   NUTRITION DIAGNOSIS:   Inadequate oral intake related to acute illness as evidenced by NPO status.  GOAL:   Provide needs based on ASPEN/SCCM guidelines  MONITOR:   TF tolerance, Vent status, Labs, Weight trends  REASON FOR ASSESSMENT:   Consult Enteral/tube feeding initiation and management  ASSESSMENT:    40 yo male admitted with acute respiratory failure with volume overload and hyperkalemia; pt with hx of ESRD on HD and missed last 3 outpatient dialysis treatment  Pt initially on Bipap but equired intubation on 01/26/16, OG in place with tip in stomach. Required emerrgent HD yesterday with UF 3 L with plan for dialysis again today  Patient is currently intubated on ventilator support MV: 7.5  L/min Temp (24hrs), Avg:97.6 F (36.4 C), Min:97.4 F (36.3 C), Max:98 F (36.7 C)  Propofol: discontinued    Past Medical History  Diagnosis Date  . Diabetes mellitus without complication (HCC)   . Hypertension   . COPD (chronic obstructive pulmonary disease) (HCC)   . Renal disorder   . Neuropathy (HCC)   . Hemodialysis patient (HCC)   . High cholesterol     Diet Order:  Diet NPO time specified  Skin:  Reviewed, no issues  Last BM:  12/26/15   Labs: BMP pending for today, yesterday 6/22 potassium 5.7, BUN 106, Creatinine 9.11  Glucose Profile:   Recent Labs  01/26/16 0420 01/26/16 0731 01/26/16 1123  GLUCAP 86 88 102*    Meds: ss novolog, MVI  Height:   Ht Readings from Last 1 Encounters:  01/25/16 5\' 5"  (1.651 m)    Weight:   Wt Readings from Last  1 Encounters:  01/25/16 161 lb 6 oz (73.2 kg)    Filed Weights   01/24/16 2119 01/25/16 0215 01/25/16 0940  Weight: 160 lb (72.576 kg) 161 lb 6 oz (73.2 kg) 161 lb 6 oz (73.2 kg)    BMI:  Body mass index is 26.85 kg/(m^2).  Estimated Nutritional Needs:   Kcal:  1646 kcals using wt of 73.2 kg  Protein:  110-146 g  Fluid:  1000 mL plus UOP  EDUCATION NEEDS:   No education needs identified at this time  Romelle Starcher MS, RD, LDN [phone] Pager  [phone] Weekend/On-Call Pager

## 2016-01-26 NOTE — Progress Notes (Signed)
ABG reviewed, patient with worsening resp acidosis despite aggressive bipap therapy, patient with worsening resp status, patient was emergently intubated,sedated.   CVL placed, OG placed.  Lucie Leather, M.D.  [facility]  Medical Director [facility] [place] [facility] Medical Director [facility] Cardio-Pulmonary Department

## 2016-01-26 NOTE — Consult Note (Signed)
[facility] [facility] Consultation      Name: Tanner Campbell MRN: [id number] DOB: Sep 21, 1975    ADMISSION DATE:  01/24/2016   REFERRING MD :  Dr. pyreddy   CHIEF COMPLAINT:   Acute resp failure   HISTORY OF PRESENT ILLNESS  Patient on biPAP, not arousable, high risk for intubation, ABG pending S/p HD yesterday  Review of Systems  Unable to perform ROS: critical illness      VITAL SIGNS    Temp:  [96.9 F (36.1 C)-98.1 F (36.7 C)] 97.4 F (36.3 C) (06/23 0400) Pulse Rate:  [66-101] 66 (06/23 0700) Resp:  [9-26] 9 (06/23 0700) BP: (90-188)/(58-149) 93/65 mmHg (06/23 0700) SpO2:  [82 %-100 %] 98 % (06/23 0747) FiO2 (%):  [40 %-70 %] 40 % (06/23 0746) Weight:  [161 lb 6 oz (73.2 kg)] 161 lb 6 oz (73.2 kg) (06/22 0940) HEMODYNAMICS:   VENTILATOR SETTINGS: Vent Mode:  [-]  FiO2 (%):  [40 %-70 %] 40 % INTAKE / OUTPUT:  Intake/Output Summary (Last 24 hours) at 01/26/16 0754 Last data filed at 01/25/16 2030  Gross per 24 hour  Intake    560 ml  Output   3000 ml  Net  -2440 ml       PHYSICAL EXAM   Physical Exam  Constitutional: He appears distressed.  HENT:  Head: Normocephalic and atraumatic.  Eyes: Pupils are equal, round, and reactive to light. No scleral icterus.  Neck: Neck supple.  Cardiovascular: Normal rate and regular rhythm.   No murmur heard. Pulmonary/Chest: He is in respiratory distress. He has no wheezes. He has rales.  resp distress  Abdominal: Soft. Bowel sounds are normal.  Musculoskeletal: He exhibits no edema.  Neurological: He displays normal reflexes. Coordination normal.  lethargic  Skin: Skin is warm. No rash noted. He is diaphoretic.       LABS   LABS:  CBC  Recent Labs Lab 01/24/16 2125 01/25/16 0432  WBC 10.5 10.5  HGB 10.9* 11.1*  HCT 33.1* 33.8*  PLT 210 187   Coag's No results for input(s): APTT, INR in the last 168 hours. BMET  Recent Labs Lab 01/24/16 2125 01/25/16 0432  NA 136  138  K 6.6* 5.7*  CL 99* 99*  CO2 19* 21*  BUN 106* 106*  CREATININE 8.96* 9.11*  GLUCOSE 270* 313*   Electrolytes  Recent Labs Lab 01/24/16 2125 01/25/16 0432  CALCIUM 8.5* 8.5*   Sepsis Markers No results for input(s): LATICACIDVEN, PROCALCITON, O2SATVEN in the last 168 hours. ABG  Recent Labs Lab 01/25/16 0248 01/25/16 0800 01/25/16 1345  PHART 7.23* 7.20* 7.31*  PCO2ART 51* 51* 55*  PO2ART 72* 51* 54*   Liver Enzymes No results for input(s): AST, ALT, ALKPHOS, BILITOT, ALBUMIN in the last 168 hours. Cardiac Enzymes  Recent Labs Lab 01/24/16 2125  TROPONINI 0.04*   Glucose  Recent Labs Lab 01/25/16 1351 01/25/16 1648 01/25/16 1958 01/26/16 0010 01/26/16 0420 01/26/16 0731  GLUCAP 234* 277* 203* 85 86 88     Recent Results (from the past 240 hour(s))  MRSA PCR Screening     Status: None   Collection Time: 01/25/16  2:22 AM  Result Value Ref Range Status   MRSA by PCR NEGATIVE NEGATIVE Final    Comment:        The GeneXpert MRSA Assay (FDA approved for NASAL specimens only), is one component of a comprehensive MRSA colonization surveillance program. It is not intended to diagnose MRSA infection nor to guide  or monitor treatment for MRSA infections.      Current facility-administered medications:  .  0.9 %  sodium chloride infusion, 250 mL, Intravenous, PRN, Ihor Austin, MD .  acetaminophen (TYLENOL) tablet 650 mg, 650 mg, Oral, Q6H PRN **OR** acetaminophen (TYLENOL) suppository 650 mg, 650 mg, Rectal, Q6H PRN, Pavan Pyreddy, MD .  albuterol (PROVENTIL) (2.5 MG/3ML) 0.083% nebulizer solution 3 mL, 3 mL, Inhalation, Q6H PRN, Pavan Pyreddy, MD .  amLODipine (NORVASC) tablet 5 mg, 5 mg, Oral, Daily, Pavan Pyreddy, MD, 5 mg at 01/25/16 1000 .  antiseptic oral rinse (CPC / CETYLPYRIDINIUM CHLORIDE 0.05%) solution 7 mL, 7 mL, Mouth Rinse, q12n4p, Auburn Bilberry, MD .  atorvastatin (LIPITOR) tablet 40 mg, 40 mg, Oral, QPM, Pavan Pyreddy, MD, 40  mg at 01/25/16 1800 .  budesonide (PULMICORT) nebulizer solution 0.5 mg, 0.5 mg, Nebulization, BID, Erin Fulling, MD, 0.5 mg at 01/26/16 0745 .  calcium acetate (PHOSLO) capsule 2,001 mg, 2,001 mg, Oral, TID WC, Ihor Austin, MD, 2,001 mg at 01/25/16 1658 .  carvedilol (COREG) tablet 25 mg, 25 mg, Oral, BID WC, Pavan Pyreddy, MD, 25 mg at 01/25/16 1658 .  chlorhexidine (PERIDEX) 0.12 % solution 15 mL, 15 mL, Mouth Rinse, BID, Auburn Bilberry, MD, 15 mL at 01/26/16 0200 .  DULoxetine (CYMBALTA) DR capsule 30 mg, 30 mg, Oral, Daily, Pavan Pyreddy, MD, 30 mg at 01/25/16 1343 .  epoetin alfa (EPOGEN,PROCRIT) injection 10,000 Units, 10,000 Units, Intravenous, Q T,Th,Sa-HD, Lamont Dowdy, MD, 10,000 Units at 01/25/16 1254 .  Ferric Citrate TABS, , Oral, Daily, Pavan Pyreddy, MD .  furosemide (LASIX) tablet 80 mg, 80 mg, Oral, BID, Pavan Pyreddy, MD, 80 mg at 01/25/16 0800 .  heparin injection 5,000 Units, 5,000 Units, Subcutaneous, Q8H, Ihor Austin, MD, 5,000 Units at 01/26/16 0556 .  insulin aspart (novoLOG) injection 0-15 Units, 0-15 Units, Subcutaneous, Q4H, Erin Fulling, MD, 5 Units at 01/25/16 2100 .  ipratropium-albuterol (DUONEB) 0.5-2.5 (3) MG/3ML nebulizer solution 3 mL, 3 mL, Nebulization, Q4H, Erin Fulling, MD, 3 mL at 01/26/16 0745 .  multivitamin (RENA-VIT) tablet 1 tablet, 1 tablet, Oral, Daily, Ihor Austin, MD, 1 tablet at 01/25/16 1343 .  ondansetron (ZOFRAN) tablet 4 mg, 4 mg, Oral, Q6H PRN **OR** ondansetron (ZOFRAN) injection 4 mg, 4 mg, Intravenous, Q6H PRN, Pavan Pyreddy, MD .  senna-docusate (Senokot-S) tablet 1 tablet, 1 tablet, Oral, QHS PRN, Pavan Pyreddy, MD .  sodium chloride flush (NS) 0.9 % injection 3 mL, 3 mL, Intravenous, Q12H, Pavan Pyreddy, MD, 3 mL at 01/25/16 2200 .  sodium chloride flush (NS) 0.9 % injection 3 mL, 3 mL, Intravenous, Q12H, Pavan Pyreddy, MD, 3 mL at 01/25/16 0830 .  sodium chloride flush (NS) 0.9 % injection 3 mL, 3 mL, Intravenous, PRN, Vivien Rota  Pyreddy, MD .  sodium polystyrene (KAYEXALATE) 15 GM/60ML suspension 30 g, 30 g, Oral, Once, Governor Rooks, MD, 30 g at 01/24/16 2300    ASSESSMENT/PLAN  40 yo male with acute on chronic resp failure from pulm edema and COPD exacerbation with elevated K with ESRD on HD  PULMONARY 1.Respiratory Failure -cont biPAP therapy -repeat ABG pending -high risk for intubation   CARDIOVASCULAR Needs ICU monitoring -cont bp meds  RENAL -HD per nephrology  GASTROINTESTINAL Keep NPO-high risk for aspiration  HEMATOLOGIC Follow h/h  INFECTIOUS No infection at this time  ENDOCRINE - ICU hypoglycemic\Hyperglycemia protocol   NEUROLOGIC Check ABG and assess for resp acidosis   The Patient requires high complexity decision making for assessment and support, frequent  evaluation and titration of therapies, application of advanced monitoring technologies and extensive interpretation of multiple databases. Critical Care Time devoted to patient care services described in this note is 40 minutes.   Overall, patient is critically ill, prognosis is guarded. Patient at high risk for cardiac arrest and death.    Lucie Leather, M.D.  [facility]  Medical Director [facility] Medical Director [facility] Cardio-Pulmonary Department

## 2016-01-26 NOTE — Progress Notes (Signed)
Pt remains sedated on Fentanyl and on ventilator. Pt able to tolerate dialysis. Pt in stable condition at this time.  Report given to [place], Charity fundraiser.

## 2016-01-26 NOTE — Progress Notes (Signed)
[facility]  ROUNDING NOTE   Subjective:   Unresponsive this morning. Intubated and sedated.  Hemodialysis yesterday. UF of 3 litres   Objective:  Vital signs in last 24 hours:  Temp:  [97.4 F (36.3 C)-98 F (36.7 C)] 97.6 F (36.4 C) (06/23 0800) Pulse Rate:  [66-101] 67 (06/23 0800) Resp:  [9-26] 10 (06/23 0800) BP: (90-188)/(58-149) 101/68 mmHg (06/23 0800) SpO2:  [82 %-100 %] 98 % (06/23 0800) FiO2 (%):  [40 %-70 %] 40 % (06/23 0800)  Weight change: 0.625 kg (1 lb 6 oz) Filed Weights   01/24/16 2119 01/25/16 0215 01/25/16 0940  Weight: 72.576 kg (160 lb) 73.2 kg (161 lb 6 oz) 73.2 kg (161 lb 6 oz)    Intake/Output: I/O last 3 completed shifts: In: 563 [P.O.:560; I.V.:3] Out: 3000 [Other:3000]   Intake/Output this shift:     Physical Exam: General: Critically ill  Head: +ETT  Eyes: Anicteric, PERRL  Neck: Supple, trachea midline  Lungs:  Bilateral crackles, FiO2 40%, PRVC   Heart: Regular rate and rhythm  Abdomen:  Soft, nontender  Extremities: trace peripheral edema.  Neurologic: Nonfocal, moving all four extremities  Skin: No lesions  Access: Left arm AVF    Basic Metabolic Panel:  Recent Labs Lab 01/24/16 2125 01/25/16 0432  NA 136 138  K 6.6* 5.7*  CL 99* 99*  CO2 19* 21*  GLUCOSE 270* 313*  BUN 106* 106*  CREATININE 8.96* 9.11*  CALCIUM 8.5* 8.5*    Liver Function Tests: No results for input(s): AST, ALT, ALKPHOS, BILITOT, PROT, ALBUMIN in the last 168 hours. No results for input(s): LIPASE, AMYLASE in the last 168 hours. No results for input(s): AMMONIA in the last 168 hours.  CBC:  Recent Labs Lab 01/24/16 2125 01/25/16 0432  WBC 10.5 10.5  HGB 10.9* 11.1*  HCT 33.1* 33.8*  MCV 84.3 86.1  PLT 210 187    Cardiac Enzymes:  Recent Labs Lab 01/24/16 2125  TROPONINI 0.04*    BNP: Invalid input(s): POCBNP  CBG:  Recent Labs Lab 01/25/16 1648 01/25/16 1958 01/26/16 0010 01/26/16 0420 01/26/16 0731   GLUCAP 277* 203* 85 86 88    Microbiology: Results for orders placed or performed during the hospital encounter of 01/24/16  MRSA PCR Screening     Status: None   Collection Time: 01/25/16  2:22 AM  Result Value Ref Range Status   MRSA by PCR NEGATIVE NEGATIVE Final    Comment:        The GeneXpert MRSA Assay (FDA approved for NASAL specimens only), is one component of a comprehensive MRSA colonization surveillance program. It is not intended to diagnose MRSA infection nor to guide or monitor treatment for MRSA infections.     Coagulation Studies: No results for input(s): LABPROT, INR in the last 72 hours.  Urinalysis: No results for input(s): COLORURINE, LABSPEC, PHURINE, GLUCOSEU, HGBUR, BILIRUBINUR, KETONESUR, PROTEINUR, UROBILINOGEN, NITRITE, LEUKOCYTESUR in the last 72 hours.  Invalid input(s): APPERANCEUR    Imaging: Dg Chest Portable 1 View  01/24/2016  CLINICAL DATA:  40 year old male with shortness of breath. EXAM: PORTABLE CHEST 1 VIEW COMPARISON:  Chest radiograph dated 12/24/2015 FINDINGS: There is stable bilateral pleural effusions and the bilateral mid to lower lung field airspace opacities compatible with atelectasis/ infiltrate. There is no pneumothorax. Stable cardiac silhouette. No acute osseous pathology. IMPRESSION: No significant interval change in bilateral pleural effusions and bilateral mid to lower lung field opacities. Electronically Signed   By: Laren Everts.D.  On: 01/24/2016 22:05     Medications:   . fentaNYL infusion INTRAVENOUS    . propofol (DIPRIVAN) infusion     . albuterol  2.5 mg Nebulization Q4H  . amLODipine  5 mg Oral Daily  . antiseptic oral rinse  7 mL Mouth Rinse q12n4p  . antiseptic oral rinse  7 mL Mouth Rinse 10 times per day  . atorvastatin  40 mg Oral QPM  . budesonide (PULMICORT) nebulizer solution  0.5 mg Nebulization BID  . calcium acetate  2,001 mg Oral TID WC  . carvedilol  25 mg Oral BID WC  .  chlorhexidine gluconate (SAGE KIT)  15 mL Mouth Rinse BID  . DULoxetine  30 mg Oral Daily  . epoetin (EPOGEN/PROCRIT) injection  10,000 Units Intravenous Q T,Th,Sa-HD  . feeding supplement (PRO-STAT SUGAR FREE 64)  30 mL Per Tube BID  . feeding supplement (VITAL HIGH PROTEIN)  1,000 mL Per Tube Q24H  . fentaNYL (SUBLIMAZE) injection  50 mcg Intravenous Once  . fentaNYL      . Ferric Citrate   Oral Daily  . furosemide  80 mg Oral BID  . heparin  5,000 Units Subcutaneous Q8H  . insulin aspart  0-15 Units Subcutaneous Q4H  . ipratropium-albuterol  3 mL Nebulization Q4H  . multivitamin  1 tablet Oral Daily  . propofol      . sodium chloride flush  3 mL Intravenous Q12H  . sodium chloride flush  3 mL Intravenous Q12H  . sodium polystyrene  30 g Oral Once   sodium chloride, acetaminophen **OR** acetaminophen, albuterol, docusate, fentaNYL, ondansetron **OR** ondansetron (ZOFRAN) IV, senna-docusate, sodium chloride flush  Assessment/ Plan:  [name] is a 40 y.o. white male with End-stage renal disease with hemodialysis Started hemodialysis May 02, 2014, Hypertension, Diabetes mellitus type 2, hyperlipidemia, Multiple sclerosis, COPD, Back surgery, Cholecystectomy, Tobacco use   TTS [facility]. left arm AVF  1. End Stage Renal Disease: with pulmonary edema and hyperkalemia. TTS Missed last three outpatient treatments. Now with respiratory failure requiring mechanical ventilation.  - emergent hemodialysis yesterday. Extra treatment today help with respiratory failure. Orders prepared.   2. Hypertension: blood pressure  Low this morning.  - home regimen of amlodipine, carvedilol, furosemide and lisinopril.   3. Anemia of chronic kidney disease: hemoglobin at goal - hold epo.   4. Secondary Hyperparathyroidism: with hyperphosphatemia:  - calcium acetate when able to take PO.    LOS: Tanner Campbell, Tanner Campbell 6/23/20179:43 AM

## 2016-01-26 NOTE — Progress Notes (Signed)
hemodiallysis started

## 2016-01-26 NOTE — Progress Notes (Signed)
Pre dialysis assessment 

## 2016-01-27 ENCOUNTER — Encounter: Payer: Self-pay | Admitting: Adult Health

## 2016-01-27 ENCOUNTER — Inpatient Hospital Stay: Payer: Medicare Other

## 2016-01-27 DIAGNOSIS — J810 Acute pulmonary edema: Secondary | ICD-10-CM

## 2016-01-27 DIAGNOSIS — J9622 Acute and chronic respiratory failure with hypercapnia: Secondary | ICD-10-CM

## 2016-01-27 LAB — CBC
HCT: 33.9 % — ABNORMAL LOW (ref 40.0–52.0)
HEMOGLOBIN: 10.7 g/dL — AB (ref 13.0–18.0)
MCH: 27.6 pg (ref 26.0–34.0)
MCHC: 31.6 g/dL — AB (ref 32.0–36.0)
MCV: 87.3 fL (ref 80.0–100.0)
Platelets: 147 10*3/uL — ABNORMAL LOW (ref 150–440)
RBC: 3.88 MIL/uL — ABNORMAL LOW (ref 4.40–5.90)
RDW: 17 % — ABNORMAL HIGH (ref 11.5–14.5)
WBC: 11.9 10*3/uL — AB (ref 3.8–10.6)

## 2016-01-27 LAB — GLUCOSE, CAPILLARY
GLUCOSE-CAPILLARY: 88 mg/dL (ref 65–99)
GLUCOSE-CAPILLARY: 86 mg/dL (ref 65–99)
GLUCOSE-CAPILLARY: 116 mg/dL — AB (ref 65–99)
Glucose-Capillary: 134 mg/dL — ABNORMAL HIGH (ref 65–99)
Glucose-Capillary: 122 mg/dL — ABNORMAL HIGH (ref 65–99)
Glucose-Capillary: 139 mg/dL — ABNORMAL HIGH (ref 65–99)

## 2016-01-27 LAB — BASIC METABOLIC PANEL
Anion gap: 11 (ref 5–15)
BUN: 43 mg/dL — AB (ref 6–20)
CALCIUM: 8.4 mg/dL — AB (ref 8.9–10.3)
CO2: 30 mmol/L (ref 22–32)
CREATININE: 4.08 mg/dL — AB (ref 0.61–1.24)
Chloride: 100 mmol/L — ABNORMAL LOW (ref 101–111)
GFR calc Af Amer: 19 mL/min — ABNORMAL LOW (ref >60)
GFR, EST NON AFRICAN AMERICAN: 17 mL/min — AB (ref >60)
GLUCOSE: 88 mg/dL (ref 65–99)
Potassium: 3.9 mmol/L (ref 3.5–5.1)
SODIUM: 141 mmol/L (ref 135–145)

## 2016-01-27 LAB — EXPECTORATED SPUTUM ASSESSMENT W REFEX TO RESP CULTURE

## 2016-01-27 LAB — PHOSPHORUS
PHOSPHORUS: 4 mg/dL (ref 2.5–4.6)
Phosphorus: 3.9 mg/dL (ref 2.5–4.6)

## 2016-01-27 LAB — PROCALCITONIN: PROCALCITONIN: 2.26 ng/mL

## 2016-01-27 LAB — MAGNESIUM
MAGNESIUM: 2.1 mg/dL (ref 1.7–2.4)
Magnesium: 2 mg/dL (ref 1.7–2.4)

## 2016-01-27 MED ORDER — PANTOPRAZOLE SODIUM 40 MG PO PACK
40.0000 mg | PACK | Freq: Every day | ORAL | Status: DC
  Administered 2016-01-27 – 2016-02-01 (×6): 40 mg
  Filled 2016-01-27 (×6): qty 20

## 2016-01-27 NOTE — Progress Notes (Signed)
Name: Tanner Campbell MRN: [id number] DOB: 07-01-1976    ADMISSION DATE:  01/24/2016   CONSULTATION DATE:  01/26/2016  REFERRING MD :  Dr Carole Binning  CHIEF COMPLAINT:  Acute respiratory failure  HISTORY OF PRESENT ILLNESS:  40 yo male with a PMH T2DM, COPD on home O2 at 5L/min, ESRD on HD, hypertension, hyperlipidemia and diabetic neuropathy who presented to the ED with worsening dyspnea after missing dialysis for two days. He goes to dialysis Tuesday, Thursday and Saturday. Patient's family indicated that he didn't feel good after his last dialysis session and couldn't get transportation on Tuesday to go to dialysis and on Wednesday, he started having difficulty breathing. His CXR showed pulmonary edema and labs showed an elevated potassium level. He was placed on BiPAP but his respiratory status continued to decline hence he was intubated and PCCM was consulted for further management. .   SUBJECTIVE: Remains intubated and sedated. Febrile overnight with a temp of 101.5 degrees F and increased tracheal secretions.  VITAL SIGNS: Temp:  [97.4 F (36.3 C)-99.3 F (37.4 C)] 99.3 F (37.4 C) (06/24 0000) Pulse Rate:  [63-80] 80 (06/24 0300) Resp:  [9-26] 15 (06/24 0300) BP: (90-129)/(53-80) 107/62 mmHg (06/24 0300) SpO2:  [94 %-100 %] 95 % (06/24 0316) FiO2 (%):  [30 %-50 %] 30 % (06/24 0316) Weight:  [153 lb 7 oz (69.6 kg)] 153 lb 7 oz (69.6 kg) (06/23 1818)  PHYSICAL EXAMINATION: General: Sedated Neuro: Awake, follows some commands, moves all extremities to noxious stimulus HEENT: PERRLA, oral mucosa moist and pink Cardiovascular: RRR, S1/S2, no MRG Lungs: Bilateral airflow, diffuse rhonchi in upper lung fields Abdomen:  Nondistended, normal bowel sounds, palpation reveals no organomegaly Musculoskeletal:  No visible joint deformities. Extremities: +2 pulses, no edema Skin: Skin is warm and dry without any open lesions   Recent Labs Lab 01/24/16 2125 01/25/16 0432  NA 136 138  K  6.6* 5.7*  CL 99* 99*  CO2 19* 21*  BUN 106* 106*  CREATININE 8.96* 9.11*  GLUCOSE 270* 313*    Recent Labs Lab 01/24/16 2125 01/25/16 0432  HGB 10.9* 11.1*  HCT 33.1* 33.8*  WBC 10.5 10.5  PLT 210 187   Dg Chest 1 View  01/26/2016  CLINICAL DATA:  Intubated EXAM: CHEST 1 VIEW COMPARISON:  01/24/2016 FINDINGS: Endotracheal tube is 5 cm above the carina. Right central line is in place with the tip in the SVC. No pneumothorax. NG tube enters the stomach. Bilateral lower lobe airspace opacities and layering effusions, not significantly changed since prior study. Heart is borderline in size. IMPRESSION: Endotracheal tube approximately 5 cm above the carina. Right central line in the SVC. No pneumothorax. Continued bilateral lower lobe opacities and effusions, unchanged. Electronically Signed   By: Charlett Nose M.D.   On: 01/26/2016 09:57   Dg Abd 1 View  01/26/2016  CLINICAL DATA:  ET tube placement, OG tube placement. EXAM: ABDOMEN - 1 VIEW COMPARISON:  None. FINDINGS: Enteric tube tip is in the proximal to mid stomach. Prior cholecystectomy. Normal bowel gas pattern. IMPRESSION: Enteric tube tip in the proximal to mid stomach. Electronically Signed   By: Charlett Nose M.D.   On: 01/26/2016 09:54    STUDIES:  None  CULTURES: 01/27/2016 Blood cultures 2. Sputum culture  ANTIBIOTICS: None  SIGNIFICANT EVENTS: 01/24/2016: ED with pulmonary edema, failed BiPAP, intubated  LINES/TUBES: CVC Triple Lumen 01/26/16 Right Internal jugular ET tube placed, 01/26/2016 OG tube placed 01/26/2016  DISCUSSION: 40 year old male presenting with acute on  chronic respiratory failure secondary to pulmonary edema,  COPD exacerbation, hypokalemia, and end-stage renal disease on hemodialysis.  ASSESSMENT / PLAN:  PULMONARY A: Acute hypercarbic and hypoxic respiratory failure secondary to volume overload. Pulmonary edema. Acute COPD exacerbation P:   -Continue full vent support with current  settings. -Nebulized bronchodilators and steroids -Daily chest x-ray. -ABG when necessary -VAP Protocol  -Daily weaning trials as tolerated  CARDIOVASCULAR A:  History of hypertension P:  -Hemodynamic monitoring per ICU protocol -Hold home blood pressure medications  RENAL A:   End-stage renal disease on dialysis Hyperkalemia P:   -Nephrology following, hemodialysis per nephrology -Monitor and correct electrolyte imbalance  GASTROINTESTINAL A:   No acute issues P:   -PPI for stress ulcer prophylaxis -Aspiration precautions -Tube feeds as tolerated  HEMATOLOGIC A:   Anemia of chronic disease-hemoglobin and hematocrit stable Thrombocytopenia P:  -Trend CBC  INFECTIOUS A:   Fever with mild leukocytosis P:   -Panculture. -Pro-calcitonin level and if elevated, will start empiric antibiotics  ENDOCRINE A:   Type 2 diabetes mellitus   P:   -Point-of-care glucose testing with Sliding-scale insulin coverage  NEUROLOGIC A:   Acute hypoxic/hypercarbic encephalopathy P:   RASS goal: -1 to -2 -Treated acidosis and hypercarbia. -Continue vent support -fentanyl and Versed for vent sedation and comfort  Disposition and Family update:   Best Practice: Code Status: Full code Diet: Nothing by mouth GI prophylaxis: PPI VTE prophylaxis:  SCD's /heparin  Avya Flavell S. [facility] ANP-BC Pulmonary and Critical Care Medicine [facility] Pager [phone]   01/27/2016, 3:30 AM

## 2016-01-27 NOTE — Progress Notes (Signed)
eLink Physician-Brief Progress Note Patient Name: Tanner Campbell DOB: May 18, 1976 MRN: [id number]   Date of Service  01/27/2016  HPI/Events of Note  GI ppx on mechanical vent  eICU Interventions  Protonix VT daily     Intervention Category Minor Interventions: Routine modifications to care plan (e.g. PRN medications for pain, fever)  Lawanda Cousins 01/27/2016, 12:30 AM

## 2016-01-27 NOTE — Progress Notes (Signed)
Nutrition Follow-up  DOCUMENTATION CODES:   Not applicable  INTERVENTION:  -Recommend starting tube feeding of vital high protein at 57ml/hr via Pepup protocol.  Will provide 1560 kcals, 136 g of protein, free water.  Minimal water flush of 30ml q 4 hr for additional free water per day (total water 1472ml/d). Tube feeding will also provide /d of K.  Discussed with tube feeding rate and Pepup protocol with RN, Myra   NUTRITION DIAGNOSIS:   Inadequate oral intake related to acute illness as evidenced by NPO status.   GOAL:   Provide needs based on ASPEN/SCCM guidelines   MONITOR:   TF tolerance, Vent status, Labs, Weight trends  REASON FOR ASSESSMENT:   Consult Enteral/tube feeding initiation and management  ASSESSMENT:    40 yo male admitted with acute respiratory failure with volume overload and hyperkalemia; pt with hx of ESRD on HD and missed last 3 outpatient dialysis treatment   Noted not planning HD today  Pt tube feeding has been off during last shift per daytime RN, Myra unsure length of time.  Noted last documented at 39ml/hr at 18:30 but prior to that rate was at 48ml/hr.  Error with order of tube feeding to start at 9:30 this am at 21ml/hr Pepup protocol vs yesterday.  Note pt tolerating tube feeding of 6ml/hr yesterday  Medications reviewed Labs reviewed: BUN 43, creatinine 4.08, K and phosphorus WDL  UOP: noted  Diet Order:  Diet NPO time specified  Skin:  Reviewed, no issues  Last BM:  6/23  Height:   Ht Readings from Last 1 Encounters:  01/26/16  (1.651 m)    Weight:   Wt Readings from Last 1 Encounters:  01/27/16 150 lb 5.7 oz (68.2 kg)    Ideal Body Weight:     BMI:  Body mass index is 25.02 kg/(m^2).  Estimated Nutritional Needs:   Kcal:  1646 kcals using wt of 73.2 kg  Protein:  110-146 g  Fluid:  1000 mL plus UOP  EDUCATION NEEDS:   No education needs identified at this time  Millee Denise B. Freida Busman,  RD, LDN [phone] (pager) Weekend/On-Call pager [phone])

## 2016-01-27 NOTE — Progress Notes (Signed)
[facility]  ROUNDING NOTE   Subjective:   Hemodialysis for last two days. Tolerated treatment well. UF total of 3.5 litres Fever of 101.5  Objective:  Vital signs in last 24 hours:  Temp:  [97.4 F (36.3 C)-101.5 F (38.6 C)] 98.5 F (36.9 C) (06/24 0900) Pulse Rate:  [63-80] 74 (06/24 0900) Resp:  [10-21] 19 (06/24 0900) BP: (94-129)/(53-80) 115/65 mmHg (06/24 0900) SpO2:  [92 %-100 %] 95 % (06/24 0900) FiO2 (%):  [30 %] 30 % (06/24 0747) Weight:  [68.2 kg (150 lb 5.7 oz)-69.6 kg (153 lb 7 oz)] 68.2 kg (150 lb 5.7 oz) (06/24 0500)  Weight change: -3.6 kg (-7 lb 15 oz) Filed Weights   01/25/16 0940 01/26/16 1818 01/27/16 0500  Weight: 73.2 kg (161 lb 6 oz) 69.6 kg (153 lb 7 oz) 68.2 kg (150 lb 5.7 oz)    Intake/Output: I/O last 3 completed shifts: In: 988.8 [P.O.:240; I.V.:255.8; NG/GT:493] Out: 550 [Other:550]   Intake/Output this shift:  Total I/O In: 15 [I.V.:15] Out: -   Physical Exam: General: Critically ill  Head: +ETT  Eyes: Anicteric, PERRL  Neck: Supple, trachea midline  Lungs:  Bilateral crackles, FiO2 30%, PRVC   Heart: Regular rate and rhythm  Abdomen:  Soft, nontender  Extremities: No peripheral edema.  Neurologic: Nonfocal, moving all four extremities  Skin: No lesions  Access: Left arm AVF    Basic Metabolic Panel:  Recent Labs Lab 01/24/16 2125 01/25/16 0432 01/26/16 1100 01/26/16 1930 01/27/16 0445  NA 136 138  --   --  141  K 6.6* 5.7*  --   --  3.9  CL 99* 99*  --   --  100*  CO2 19* 21*  --   --  30  GLUCOSE 270* 313*  --   --  88  BUN 106* 106*  --   --  43*  CREATININE 8.96* 9.11*  --   --  4.08*  CALCIUM 8.5* 8.5*  --   --  8.4*  MG  --   --  2.5* 2.0 2.0  PHOS  --   --  6.8* 3.7 3.9    Liver Function Tests: No results for input(s): AST, ALT, ALKPHOS, BILITOT, PROT, ALBUMIN in the last 168 hours. No results for input(s): LIPASE, AMYLASE in the last 168 hours. No results for input(s): AMMONIA in the last  168 hours.  CBC:  Recent Labs Lab 01/24/16 2125 01/25/16 0432 01/27/16 0445  WBC 10.5 10.5 11.9*  HGB 10.9* 11.1* 10.7*  HCT 33.1* 33.8* 33.9*  MCV 84.3 86.1 87.3  PLT 210 187 147*    Cardiac Enzymes:  Recent Labs Lab 01/24/16 2125  TROPONINI 0.04*    BNP: Invalid input(s): POCBNP  CBG:  Recent Labs Lab 01/26/16 1622 01/26/16 1956 01/26/16 2326 01/27/16 0356 01/27/16 0727  GLUCAP 104* 120* 123* 88 47    Microbiology: Results for orders placed or performed during the hospital encounter of 01/24/16  MRSA PCR Screening     Status: None   Collection Time: 01/25/16  2:22 AM  Result Value Ref Range Status   MRSA by PCR NEGATIVE NEGATIVE Final    Comment:        The GeneXpert MRSA Assay (FDA approved for NASAL specimens only), is one component of a comprehensive MRSA colonization surveillance program. It is not intended to diagnose MRSA infection nor to guide or monitor treatment for MRSA infections.   Culture, expectorated sputum-assessment     Status: None  Collection Time: 01/27/16  4:45 AM  Result Value Ref Range Status   Specimen Description TRACHEAL ASPIRATE  Final   Special Requests NONE  Final   Sputum evaluation THIS SPECIMEN IS ACCEPTABLE FOR SPUTUM CULTURE  Final   Report Status 01/27/2016 FINAL  Final    Coagulation Studies: No results for input(s): LABPROT, INR in the last 72 hours.  Urinalysis: No results for input(s): COLORURINE, LABSPEC, PHURINE, GLUCOSEU, HGBUR, BILIRUBINUR, KETONESUR, PROTEINUR, UROBILINOGEN, NITRITE, LEUKOCYTESUR in the last 72 hours.  Invalid input(s): APPERANCEUR    Imaging: Dg Chest 1 View  01/26/2016  CLINICAL DATA:  Intubated EXAM: CHEST 1 VIEW COMPARISON:  01/24/2016 FINDINGS: Endotracheal tube is 5 cm above the carina. Right central line is in place with the tip in the SVC. No pneumothorax. NG tube enters the stomach. Bilateral lower lobe airspace opacities and layering effusions, not significantly  changed since prior study. Heart is borderline in size. IMPRESSION: Endotracheal tube approximately 5 cm above the carina. Right central line in the SVC. No pneumothorax. Continued bilateral lower lobe opacities and effusions, unchanged. Electronically Signed   By: Rolm Baptise M.D.   On: 01/26/2016 09:57   Dg Abd 1 View  01/26/2016  CLINICAL DATA:  ET tube placement, OG tube placement. EXAM: ABDOMEN - 1 VIEW COMPARISON:  None. FINDINGS: Enteric tube tip is in the proximal to mid stomach. Prior cholecystectomy. Normal bowel gas pattern. IMPRESSION: Enteric tube tip in the proximal to mid stomach. Electronically Signed   By: Rolm Baptise M.D.   On: 01/26/2016 09:54     Medications:   . fentaNYL infusion INTRAVENOUS 150 mcg/hr (01/27/16 0900)   . antiseptic oral rinse  7 mL Mouth Rinse 10 times per day  . atorvastatin  40 mg Oral QPM  . budesonide (PULMICORT) nebulizer solution  0.5 mg Nebulization BID  . calcium acetate  2,001 mg Oral TID WC  . chlorhexidine gluconate (SAGE KIT)  15 mL Mouth Rinse BID  . DULoxetine  30 mg Oral Daily  . epoetin (EPOGEN/PROCRIT) injection  10,000 Units Intravenous Q T,Th,Sa-HD  . feeding supplement (VITAL HIGH PROTEIN)  1,000 mL Per Tube Q24H  . heparin  5,000 Units Subcutaneous Q8H  . insulin aspart  2-6 Units Subcutaneous Q4H  . ipratropium-albuterol  3 mL Nebulization Q4H  . multivitamin  1 tablet Oral Daily  . pantoprazole sodium  40 mg Per Tube QAC breakfast  . sodium chloride flush  3 mL Intravenous Q12H  . sodium chloride flush  3 mL Intravenous Q12H  . sodium polystyrene  30 g Oral Once   sodium chloride, acetaminophen **OR** acetaminophen, albuterol, docusate, fentaNYL, midazolam, ondansetron **OR** ondansetron (ZOFRAN) IV, senna-docusate, sodium chloride flush  Assessment/ Plan:  [name] is a 40 y.o. white male with End-stage renal disease with hemodialysis Started hemodialysis May 02, 2014, Hypertension, Diabetes mellitus type  2, hyperlipidemia, Multiple sclerosis, COPD, Back surgery, Cholecystectomy, Tobacco use   TTS [facility] [facility]. left arm AVF  1. End Stage Renal Disease: with pulmonary edema and hyperkalemia. TTS Missed last three outpatient treatments. Now with respiratory failure requiring mechanical ventilation.  - emergent hemodialysis for last two days.  - hold dialysis for today. Will evaluate daily for dialysis need.   2. Hypertension: blood pressure  - home regimen of amlodipine, carvedilol, furosemide and lisinopril.   3. Anemia of chronic kidney disease: hemoglobin at goal - hold epo.   4. Secondary Hyperparathyroidism: with hyperphosphatemia:  - calcium acetate   LOS: 2  Overton, Billal Rollo 6/24/20179:22 AM

## 2016-01-28 ENCOUNTER — Inpatient Hospital Stay: Payer: Medicare Other

## 2016-01-28 LAB — BASIC METABOLIC PANEL
Anion gap: 12 (ref 5–15)
BUN: 61 mg/dL — AB (ref 6–20)
CHLORIDE: 97 mmol/L — AB (ref 101–111)
CO2: 31 mmol/L (ref 22–32)
CREATININE: 5.11 mg/dL — AB (ref 0.61–1.24)
Calcium: 8.5 mg/dL — ABNORMAL LOW (ref 8.9–10.3)
GFR calc Af Amer: 15 mL/min — ABNORMAL LOW (ref >60)
GFR calc non Af Amer: 13 mL/min — ABNORMAL LOW (ref >60)
Glucose, Bld: 211 mg/dL — ABNORMAL HIGH (ref 65–99)
Potassium: 3.7 mmol/L (ref 3.5–5.1)
SODIUM: 140 mmol/L (ref 135–145)

## 2016-01-28 LAB — CBC
HEMATOCRIT: 34.2 % — AB (ref 40.0–52.0)
Hemoglobin: 10.8 g/dL — ABNORMAL LOW (ref 13.0–18.0)
MCH: 27.6 pg (ref 26.0–34.0)
MCHC: 31.7 g/dL — AB (ref 32.0–36.0)
MCV: 86.9 fL (ref 80.0–100.0)
PLATELETS: 128 10*3/uL — AB (ref 150–440)
RBC: 3.93 MIL/uL — ABNORMAL LOW (ref 4.40–5.90)
RDW: 16.8 % — AB (ref 11.5–14.5)
WBC: 14.1 10*3/uL — ABNORMAL HIGH (ref 3.8–10.6)

## 2016-01-28 LAB — GLUCOSE, CAPILLARY
GLUCOSE-CAPILLARY: 189 mg/dL — AB (ref 65–99)
GLUCOSE-CAPILLARY: 160 mg/dL — AB (ref 65–99)
GLUCOSE-CAPILLARY: 172 mg/dL — AB (ref 65–99)
GLUCOSE-CAPILLARY: 206 mg/dL — AB (ref 65–99)
Glucose-Capillary: 196 mg/dL — ABNORMAL HIGH (ref 65–99)

## 2016-01-28 MED ORDER — HYDROCORTISONE 1 % EX OINT
TOPICAL_OINTMENT | Freq: Two times a day (BID) | CUTANEOUS | Status: DC
  Administered 2016-01-28: 1 via TOPICAL
  Administered 2016-01-29 – 2016-01-31 (×6): via TOPICAL
  Administered 2016-02-01: 1 via TOPICAL
  Filled 2016-01-28: qty 28.35

## 2016-01-28 MED ORDER — CEFEPIME HCL 1 G IJ SOLR
1.0000 g | Freq: Once | INTRAMUSCULAR | Status: AC
  Administered 2016-01-28: 1 g via INTRAVENOUS
  Filled 2016-01-28: qty 1

## 2016-01-28 MED ORDER — DEXTROSE 5 % IV SOLN
500.0000 mg | Freq: Once | INTRAVENOUS | Status: AC
  Administered 2016-01-28: 500 mg via INTRAVENOUS
  Filled 2016-01-28: qty 0.5

## 2016-01-28 MED ORDER — DEXTROSE 5 % IV SOLN
500.0000 mg | INTRAVENOUS | Status: DC | PRN
  Filled 2016-01-28: qty 0.5

## 2016-01-28 NOTE — Progress Notes (Signed)
[facility]  ROUNDING NOTE   Subjective:   Did not get hemodialysis yesterday. Now with worsening pulmonary edema on Chest x-ray.   Objective:  Vital signs in last 24 hours:  Temp:  [98.5 F (36.9 C)-99.4 F (37.4 C)] 98.8 F (37.1 C) (06/25 0400) Pulse Rate:  [72-79] 76 (06/25 0600) Resp:  [11-16] 15 (06/25 0600) BP: (108-136)/(55-69) 126/67 mmHg (06/25 0600) SpO2:  [93 %-100 %] 96 % (06/25 0600) FiO2 (%):  [30 %] 30 % (06/25 0733) Weight:  [66.4 kg (146 lb 6.2 oz)] 66.4 kg (146 lb 6.2 oz) (06/25 0400)  Weight change: -3.2 kg (-7 lb 0.9 oz) Filed Weights   01/26/16 1818 01/27/16 0500 01/28/16 0400  Weight: 69.6 kg (153 lb 7 oz) 68.2 kg (150 lb 5.7 oz) 66.4 kg (146 lb 6.2 oz)    Intake/Output: I/O last 3 completed shifts: In: 2025.8 [I.V.:567.8; NG/GT:1458] Out: -    Intake/Output this shift:     Physical Exam: General: Critically ill  Head: +ETT  Eyes: Anicteric, PERRL  Neck: Supple, trachea midline  Lungs:  Bilateral crackles, +wheezes, FiO2 30%, PRVC   Heart: Regular rate and rhythm  Abdomen:  Soft, nontender  Extremities: No peripheral edema.  Neurologic: Nonfocal, moving all four extremities  Skin: No lesions  Access: Left arm AVF    Basic Metabolic Panel:  Recent Labs Lab 01/24/16 2125 01/25/16 0432 01/26/16 1100 01/26/16 1930 01/27/16 0445 01/27/16 1649 01/28/16 0404  NA 136 138  --   --  141  --  140  K 6.6* 5.7*  --   --  3.9  --  3.7  CL 99* 99*  --   --  100*  --  97*  CO2 19* 21*  --   --  30  --  31  GLUCOSE 270* 313*  --   --  88  --  211*  BUN 106* 106*  --   --  43*  --  61*  CREATININE 8.96* 9.11*  --   --  4.08*  --  5.11*  CALCIUM 8.5* 8.5*  --   --  8.4*  --  8.5*  MG  --   --  2.5* 2.0 2.0 2.1  --   PHOS  --   --  6.8* 3.7 3.9 4.0  --     Liver Function Tests: No results for input(s): AST, ALT, ALKPHOS, BILITOT, PROT, ALBUMIN in the last 168 hours. No results for input(s): LIPASE, AMYLASE in the last 168  hours. No results for input(s): AMMONIA in the last 168 hours.  CBC:  Recent Labs Lab 01/24/16 2125 01/25/16 0432 01/27/16 0445 01/28/16 0349  WBC 10.5 10.5 11.9* 14.1*  HGB 10.9* 11.1* 10.7* 10.8*  HCT 33.1* 33.8* 33.9* 34.2*  MCV 84.3 86.1 87.3 86.9  PLT 210 187 147* 128*    Cardiac Enzymes:  Recent Labs Lab 01/24/16 2125  TROPONINI 0.04*    BNP: Invalid input(s): POCBNP  CBG:  Recent Labs Lab 01/27/16 1604 01/27/16 1920 01/27/16 2324 01/28/16 0343 01/28/16 0722  GLUCAP 134* 122* 139* 196* 189*    Microbiology: Results for orders placed or performed during the hospital encounter of 01/24/16  MRSA PCR Screening     Status: None   Collection Time: 01/25/16  2:22 AM  Result Value Ref Range Status   MRSA by PCR NEGATIVE NEGATIVE Final    Comment:        The GeneXpert MRSA Assay (FDA approved for NASAL specimens only),  is one component of a comprehensive MRSA colonization surveillance program. It is not intended to diagnose MRSA infection nor to guide or monitor treatment for MRSA infections.   Culture, expectorated sputum-assessment     Status: None   Collection Time: 01/27/16  4:45 AM  Result Value Ref Range Status   Specimen Description TRACHEAL ASPIRATE  Final   Special Requests NONE  Final   Sputum evaluation THIS SPECIMEN IS ACCEPTABLE FOR SPUTUM CULTURE  Final   Report Status 01/27/2016 FINAL  Final  Culture, respiratory (NON-Expectorated)     Status: None (Preliminary result)   Collection Time: 01/27/16  4:45 AM  Result Value Ref Range Status   Specimen Description TRACHEAL ASPIRATE  Final   Special Requests NONE Reflexed from [id number]  Final   Gram Stain   Final    ABUNDANT WBC PRESENT, PREDOMINANTLY PMN NO SQUAMOUS EPITHELIAL CELLS SEEN ABUNDANT GRAM NEGATIVE COCCOBACILLI FEW GRAM POSITIVE COCCI IN PAIRS RARE GRAM POSITIVE RODS Performed at [facility]    Culture PENDING  Incomplete   Report Status PENDING  Incomplete   Culture, blood (Routine X 2) w Reflex to ID Panel     Status: None (Preliminary result)   Collection Time: 01/27/16  5:18 AM  Result Value Ref Range Status   Specimen Description BLOOD RIGHT ARM  Final   Special Requests BOTTLES DRAWN AEROBIC AND ANAEROBIC 5CC  Final   Culture NO GROWTH 1 DAY  Final   Report Status PENDING  Incomplete  Culture, blood (Routine X 2) w Reflex to ID Panel     Status: None (Preliminary result)   Collection Time: 01/27/16  5:23 AM  Result Value Ref Range Status   Specimen Description BLOOD RIGHT HAND  Final   Special Requests BOTTLES DRAWN AEROBIC AND ANAEROBIC 5CC  Final   Culture NO GROWTH 1 DAY  Final   Report Status PENDING  Incomplete    Coagulation Studies: No results for input(s): LABPROT, INR in the last 72 hours.  Urinalysis: No results for input(s): COLORURINE, LABSPEC, PHURINE, GLUCOSEU, HGBUR, BILIRUBINUR, KETONESUR, PROTEINUR, UROBILINOGEN, NITRITE, LEUKOCYTESUR in the last 72 hours.  Invalid input(s): APPERANCEUR    Imaging: Dg Chest 1 View  01/26/2016  CLINICAL DATA:  Intubated EXAM: CHEST 1 VIEW COMPARISON:  01/24/2016 FINDINGS: Endotracheal tube is 5 cm above the carina. Right central line is in place with the tip in the SVC. No pneumothorax. NG tube enters the stomach. Bilateral lower lobe airspace opacities and layering effusions, not significantly changed since prior study. Heart is borderline in size. IMPRESSION: Endotracheal tube approximately 5 cm above the carina. Right central line in the SVC. No pneumothorax. Continued bilateral lower lobe opacities and effusions, unchanged. Electronically Signed   By: Rolm Baptise M.D.   On: 01/26/2016 09:57   Dg Abd 1 View  01/26/2016  CLINICAL DATA:  ET tube placement, OG tube placement. EXAM: ABDOMEN - 1 VIEW COMPARISON:  None. FINDINGS: Enteric tube tip is in the proximal to mid stomach. Prior cholecystectomy. Normal bowel gas pattern. IMPRESSION: Enteric tube tip in the proximal to mid  stomach. Electronically Signed   By: Rolm Baptise M.D.   On: 01/26/2016 09:54   Dg Chest Port 1 View  01/28/2016  CLINICAL DATA:  Acute respiratory failure, diabetes mellitus, hypertension, COPD, end-stage renal disease EXAM: PORTABLE CHEST 1 VIEW COMPARISON:  Portable exam 0549 hours compared to 01/27/2016 FINDINGS: Tip of endotracheal tube projects 3.4 cm above carina. Nasogastric tube extends into stomach. RIGHT jugular central venous  catheter with tip projecting over SVC. Enlargement of cardiac silhouette with vascular congestion. BILATERAL perihilar infiltrates greater on RIGHT likely representing asymmetric edema. Bibasilar effusions and atelectasis, unable to exclude consolidation in lower lobes. No pneumothorax. Bones demineralized. Prior cervical spine fusion. IMPRESSION: Persistent pulmonary edema with bibasilar effusions and atelectasis as above. Electronically Signed   By: Lavonia Dana M.D.   On: 01/28/2016 08:48   Dg Chest Port 1 View  01/27/2016  CLINICAL DATA:  Acute respiratory failure. History of hypertension and COPD. EXAM: PORTABLE CHEST 1 VIEW COMPARISON:  Chest x-ray dated 01/26/2016. FINDINGS: Endotracheal tube is adequately positioned with tip approximately 4 cm above the carina. Right IJ central line is well positioned with tip at the level of the lower SVC. Cardiomediastinal silhouette is stable in size and configuration. There are bilateral pleural effusions, left greater than right, at least moderate in size on the left, stable. Patchy opacities at the adjacent lung bases are probably atelectasis. There is mild interstitial edema without significant interval change. No new lung abnormality seen. No pneumothorax. IMPRESSION: 1. Stable chest x-ray. 2. Bilateral pleural effusions, left greater than right, with probable adjacent atelectasis. 3. Mild interstitial edema. Electronically Signed   By: Franki Cabot M.D.   On: 01/27/2016 10:43     Medications:   . fentaNYL infusion  INTRAVENOUS 150 mcg/hr (01/27/16 1853)   . antiseptic oral rinse  7 mL Mouth Rinse 10 times per day  . atorvastatin  40 mg Oral QPM  . budesonide (PULMICORT) nebulizer solution  0.5 mg Nebulization BID  . calcium acetate  2,001 mg Oral TID WC  . chlorhexidine gluconate (SAGE KIT)  15 mL Mouth Rinse BID  . DULoxetine  30 mg Oral Daily  . epoetin (EPOGEN/PROCRIT) injection  10,000 Units Intravenous Q T,Th,Sa-HD  . feeding supplement (VITAL HIGH PROTEIN)  1,000 mL Per Tube Q24H  . heparin  5,000 Units Subcutaneous Q8H  . insulin aspart  2-6 Units Subcutaneous Q4H  . ipratropium-albuterol  3 mL Nebulization Q4H  . multivitamin  1 tablet Oral Daily  . pantoprazole sodium  40 mg Per Tube QAC breakfast  . sodium chloride flush  3 mL Intravenous Q12H  . sodium chloride flush  3 mL Intravenous Q12H  . sodium polystyrene  30 g Oral Once   sodium chloride, acetaminophen **OR** acetaminophen, albuterol, ceFEPime (MAXIPIME) IV, docusate, fentaNYL, midazolam, ondansetron **OR** ondansetron (ZOFRAN) IV, senna-docusate, sodium chloride flush  Assessment/ Plan:  [name] is a 40 y.o. white male with End-stage renal disease with hemodialysis Started hemodialysis May 02, 2014, Hypertension, Diabetes mellitus type 2, hyperlipidemia, Multiple sclerosis, COPD, Back surgery, Cholecystectomy, Tobacco use   TTS [facility] [facility]. left arm AVF  1. End Stage Renal Disease: with pulmonary edema and hyperkalemia. TTS Missed last three outpatient treatments. Now with respiratory failure requiring mechanical ventilation.  - emergent hemodialysis for Thursday 6/22 and Friday 6/23.  - Dialysis for today. Orders prepared. Monitor daily for dialysis need.   2. Hypertension: blood pressure  - home regimen of amlodipine, carvedilol, furosemide and lisinopril.   3. Anemia of chronic kidney disease: hemoglobin at goal - hold epo.   4. Secondary Hyperparathyroidism: with hyperphosphatemia:  -  calcium acetate   LOS: [place], [place] 6/25/20179:11 AM

## 2016-01-28 NOTE — Progress Notes (Signed)
PRE DIALYSIS ASSESSMENT 

## 2016-01-28 NOTE — Progress Notes (Signed)
Uneventful day. Remains intubated and sedated. Tolerating tube feeds without  issues. No ventilator weaning done. Chest xray has not improved.. Afebrile today. Dialyzed and 1 liter of fluid pulled off.

## 2016-01-28 NOTE — Progress Notes (Signed)
Name: Tanner Campbell MRN: [id number] DOB: [birth year]    ADMISSION DATE:  01/24/2016   CONSULTATION DATE:  01/26/2016  REFERRING MD :  Dr Carole Binning  CHIEF COMPLAINT:  Acute respiratory failure  HISTORY OF PRESENT ILLNESS:  40 yo male with a PMH T2DM, COPD on home O2 at 5L/min, ESRD on HD, hypertension, hyperlipidemia and diabetic neuropathy who presented to the ED with worsening dyspnea after missing dialysis for two days. He goes to dialysis Tuesday, Thursday and Saturday. Patient's family indicated that he didn't feel good after his last dialysis session and couldn't get transportation on Tuesday to go to dialysis and on Wednesday, he started having difficulty breathing. His CXR showed pulmonary edema and labs showed an elevated potassium level. He was placed on BiPAP but his respiratory status continued to decline hence he was intubated and PCCM was consulted for further management. .   SUBJECTIVE: Remains intubated and sedated. Copious tracheal secretions. No fever  VITAL SIGNS: Temp:  [98.5 F (36.9 C)-101.5 F (38.6 C)] 99.4 F (37.4 C) (06/24 2300) Pulse Rate:  [72-80] 76 (06/25 0300) Resp:  [11-19] 15 (06/25 0300) BP: (108-136)/(55-69) 136/69 mmHg (06/25 0300) SpO2:  [92 %-100 %] 100 % (06/25 0315) FiO2 (%):  [30 %] 30 % (06/25 0315) Weight:  [150 lb 5.7 oz (68.2 kg)] 150 lb 5.7 oz (68.2 kg) (06/24 0500)  PHYSICAL EXAMINATION: General: Sedated Neuro: Awake, follows some commands, moves all extremities to noxious stimulus HEENT: PERRLA, oral mucosa moist and pink Cardiovascular: RRR, S1/S2, no MRG Lungs: Bilateral airflow, diffuse rhonchi in upper lung fields Abdomen:  Nondistended, normal bowel sounds, palpation reveals no organomegaly Musculoskeletal:  No visible joint deformities. Extremities: +2 pulses, no edema Skin: Skin is warm and dry without any open lesions   Recent Labs Lab 01/24/16 2125 01/25/16 0432 01/27/16 0445  NA 136 138 141  K 6.6* 5.7* 3.9  CL 99*  99* 100*  CO2 19* 21* 30  BUN 106* 106* 43*  CREATININE 8.96* 9.11* 4.08*  GLUCOSE 270* 313* 88    Recent Labs Lab 01/24/16 2125 01/25/16 0432 01/27/16 0445  HGB 10.9* 11.1* 10.7*  HCT 33.1* 33.8* 33.9*  WBC 10.5 10.5 11.9*  PLT 210 187 147*   Dg Chest 1 View  01/26/2016  CLINICAL DATA:  Intubated EXAM: CHEST 1 VIEW COMPARISON:  01/24/2016 FINDINGS: Endotracheal tube is 5 cm above the carina. Right central line is in place with the tip in the SVC. No pneumothorax. NG tube enters the stomach. Bilateral lower lobe airspace opacities and layering effusions, not significantly changed since prior study. Heart is borderline in size. IMPRESSION: Endotracheal tube approximately 5 cm above the carina. Right central line in the SVC. No pneumothorax. Continued bilateral lower lobe opacities and effusions, unchanged. Electronically Signed   By: Charlett Nose M.D.   On: 01/26/2016 09:57   Dg Abd 1 View  01/26/2016  CLINICAL DATA:  ET tube placement, OG tube placement. EXAM: ABDOMEN - 1 VIEW COMPARISON:  None. FINDINGS: Enteric tube tip is in the proximal to mid stomach. Prior cholecystectomy. Normal bowel gas pattern. IMPRESSION: Enteric tube tip in the proximal to mid stomach. Electronically Signed   By: Charlett Nose M.D.   On: 01/26/2016 09:54   Dg Chest Port 1 View  01/27/2016  CLINICAL DATA:  Acute respiratory failure. History of hypertension and COPD. EXAM: PORTABLE CHEST 1 VIEW COMPARISON:  Chest x-ray dated 01/26/2016. FINDINGS: Endotracheal tube is adequately positioned with tip approximately 4 cm above the carina. Right  IJ central line is well positioned with tip at the level of the lower SVC. Cardiomediastinal silhouette is stable in size and configuration. There are bilateral pleural effusions, left greater than right, at least moderate in size on the left, stable. Patchy opacities at the adjacent lung bases are probably atelectasis. There is mild interstitial edema without significant interval  change. No new lung abnormality seen. No pneumothorax. IMPRESSION: 1. Stable chest x-ray. 2. Bilateral pleural effusions, left greater than right, with probable adjacent atelectasis. 3. Mild interstitial edema. Electronically Signed   By: Bary Richard M.D.   On: 01/27/2016 10:43    STUDIES:  None  CULTURES: 01/27/2016 Blood cultures 2. Sputum culture: Prelim-ABUNDANT GRAM NEGATIVE COCCOBACILLI  FEW GRAM POSITIVE COCCI IN PAIRS RARE GRAM POSITIVE RODS   ANTIBIOTICS: None  SIGNIFICANT EVENTS: 01/24/2016: ED with pulmonary edema, failed BiPAP, intubated  LINES/TUBES: CVC Triple Lumen 01/26/16 Right Internal jugular ET tube placed, 01/26/2016 OG tube placed 01/26/2016  DISCUSSION: 40 year old male presenting with acute on chronic respiratory failure secondary to pulmonary edema,  COPD exacerbation, hypokalemia, and end-stage renal disease on hemodialysis.  ASSESSMENT / PLAN:  PULMONARY A: Acute hypercarbic and hypoxic respiratory failure secondary to volume overload. Pulmonary edema. Acute COPD exacerbation P:   -Continue full vent support with current settings. -Nebulized bronchodilators and steroids -Daily chest x-ray. -ABG when necessary -VAP Protocol  -Daily weaning trials as tolerated  CARDIOVASCULAR A:  History of hypertension P:  -Hemodynamic monitoring per ICU protocol -Hold home blood pressure medications  RENAL A:   End-stage renal disease on dialysis Hyperkalemia P:   -Nephrology following, hemodialysis per nephrology -Monitor and correct electrolyte imbalance  GASTROINTESTINAL A:   No acute issues P:   -PPI for stress ulcer prophylaxis -Aspiration precautions -Tube feeds as tolerated  HEMATOLOGIC A:   Anemia of chronic disease-hemoglobin and hematocrit stable Thrombocytopenia P:  -Trend CBC  INFECTIOUS A:   Fever with mild leukocytosis-Prelim sputum culture with abundant GPB and GPC in pairs P:   -Start cefepime as patient has  penicillin  allergy -f/u final cultures  ENDOCRINE A:   Type 2 diabetes mellitus   P:   -Point-of-care glucose testing with Sliding-scale insulin coverage  NEUROLOGIC A:   Acute hypoxic/hypercarbic encephalopathy P:   RASS goal: -1 to -2 -Treated acidosis and hypercarbia. -Continue vent support -fentanyl and Versed for vent sedation and comfort  Disposition and Family update:   Best Practice: Code Status: Full code Diet: Nothing by mouth GI prophylaxis: PPI VTE prophylaxis:  SCD's /heparin  Abdul Beirne S. [facility] ANP-BC Pulmonary and Critical Care Medicine [facility] Pager [phone]   01/28/2016, 3:56 AM

## 2016-01-28 NOTE — Progress Notes (Signed)
POST DIALYSIS ASSESSMENT 

## 2016-01-28 NOTE — Progress Notes (Signed)
Pharmacy Antibiotic Note  Tanner Campbell is a 40 y.o. male with ESRD on HD admitted with respiratory failure due to volume overload/COPD to begin empiric abx.  Pharmacy has been consulted for cefepime dosing.  Plan: Cefepime 1 g iv once then 500 mg iv q HD. Will need to f/u dialysis schedule- normally TTS but patient receiving prn HD due to missing sessions.   Height: 5\' 5"  (165.1 cm) Weight: 146 lb 6.2 oz (66.4 kg) IBW/kg (Calculated) : 61.5  Temp (24hrs), Avg:98.8 F (37.1 C), Min:98.5 F (36.9 C), Max:99.4 F (37.4 C)   Recent Labs Lab 01/24/16 2125 01/25/16 0432 01/27/16 0445 01/28/16 0349 01/28/16 0404  WBC 10.5 10.5 11.9* 14.1*  --   CREATININE 8.96* 9.11* 4.08*  --  5.11*    Estimated Creatinine Clearance: 16.5 mL/min (by C-G formula based on Cr of 5.11).    Allergies  Allergen Reactions  . Penicillins Other (See Comments)    Reaction: Unknown    Antimicrobials this admission: cefepime 6/25 >>    Dose adjustments this admission:   Microbiology results: 6/24 BCx: NGTD x 2 UCx: pending  6/24 Sputum: GPC/GPR/gram negative coccobacilli (pending)  6/22 MRSA PCR: negative  Thank you for allowing pharmacy to be a part of this patient's care.  Valentina Gu 01/28/2016 7:39 AM

## 2016-01-28 NOTE — Progress Notes (Signed)
STARTED DIALYSIS 

## 2016-01-28 NOTE — Progress Notes (Signed)
HD COMPLETED  

## 2016-01-29 ENCOUNTER — Inpatient Hospital Stay: Payer: Medicare Other

## 2016-01-29 LAB — CULTURE, RESPIRATORY W GRAM STAIN

## 2016-01-29 LAB — RENAL FUNCTION PANEL
ALBUMIN: 2.1 g/dL — AB (ref 3.5–5.0)
ANION GAP: 8 (ref 5–15)
BUN: 47 mg/dL — AB (ref 6–20)
CHLORIDE: 97 mmol/L — AB (ref 101–111)
CO2: 34 mmol/L — ABNORMAL HIGH (ref 22–32)
Calcium: 8.3 mg/dL — ABNORMAL LOW (ref 8.9–10.3)
Creatinine, Ser: 3.55 mg/dL — ABNORMAL HIGH (ref 0.61–1.24)
GFR calc Af Amer: 23 mL/min — ABNORMAL LOW (ref >60)
GFR, EST NON AFRICAN AMERICAN: 20 mL/min — AB (ref >60)
GLUCOSE: 250 mg/dL — AB (ref 65–99)
PHOSPHORUS: 1.6 mg/dL — AB (ref 2.5–4.6)
POTASSIUM: 3.6 mmol/L (ref 3.5–5.1)
Sodium: 139 mmol/L (ref 135–145)

## 2016-01-29 LAB — TRIGLYCERIDES
Triglycerides: 76 mg/dL (ref <150)
Triglycerides: 77 mg/dL (ref <150)

## 2016-01-29 LAB — MAGNESIUM: MAGNESIUM: 2.1 mg/dL (ref 1.7–2.4)

## 2016-01-29 LAB — GLUCOSE, CAPILLARY
GLUCOSE-CAPILLARY: 228 mg/dL — AB (ref 65–99)
GLUCOSE-CAPILLARY: 224 mg/dL — AB (ref 65–99)
GLUCOSE-CAPILLARY: 141 mg/dL — AB (ref 65–99)
Glucose-Capillary: 159 mg/dL — ABNORMAL HIGH (ref 65–99)
Glucose-Capillary: 169 mg/dL — ABNORMAL HIGH (ref 65–99)
Glucose-Capillary: 200 mg/dL — ABNORMAL HIGH (ref 65–99)

## 2016-01-29 LAB — CBC
HEMATOCRIT: 34.6 % — AB (ref 40.0–52.0)
Hemoglobin: 10.9 g/dL — ABNORMAL LOW (ref 13.0–18.0)
MCH: 28.2 pg (ref 26.0–34.0)
MCHC: 31.5 g/dL — AB (ref 32.0–36.0)
MCV: 89.3 fL (ref 80.0–100.0)
Platelets: 132 10*3/uL — ABNORMAL LOW (ref 150–440)
RBC: 3.88 MIL/uL — ABNORMAL LOW (ref 4.40–5.90)
RDW: 17.3 % — AB (ref 11.5–14.5)
WBC: 19 10*3/uL — AB (ref 3.8–10.6)

## 2016-01-29 LAB — CULTURE, RESPIRATORY

## 2016-01-29 MED ORDER — INSULIN ASPART 100 UNIT/ML ~~LOC~~ SOLN
0.0000 [IU] | SUBCUTANEOUS | Status: DC
  Administered 2016-01-29: 4 [IU] via SUBCUTANEOUS
  Administered 2016-01-29: 3 [IU] via SUBCUTANEOUS
  Administered 2016-01-29: 4 [IU] via SUBCUTANEOUS
  Administered 2016-01-30 (×4): 3 [IU] via SUBCUTANEOUS
  Administered 2016-01-30: 4 [IU] via SUBCUTANEOUS
  Administered 2016-01-30: 7 [IU] via SUBCUTANEOUS
  Administered 2016-01-31: 4 [IU] via SUBCUTANEOUS
  Administered 2016-01-31 (×2): 3 [IU] via SUBCUTANEOUS
  Administered 2016-01-31: 4 [IU] via SUBCUTANEOUS
  Administered 2016-02-01: 3 [IU] via SUBCUTANEOUS
  Filled 2016-01-29 (×5): qty 3
  Filled 2016-01-29 (×3): qty 4
  Filled 2016-01-29: qty 3
  Filled 2016-01-29: qty 7
  Filled 2016-01-29 (×2): qty 3
  Filled 2016-01-29 (×2): qty 4

## 2016-01-29 MED ORDER — POLYETHYLENE GLYCOL 3350 17 G PO PACK
17.0000 g | PACK | Freq: Once | ORAL | Status: AC
  Administered 2016-01-29: 17 g via ORAL
  Filled 2016-01-29: qty 1

## 2016-01-29 MED ORDER — LACTULOSE 10 GM/15ML PO SOLN
30.0000 g | Freq: Once | ORAL | Status: AC
  Administered 2016-01-29: 30 g via ORAL
  Filled 2016-01-29: qty 60

## 2016-01-29 MED ORDER — FENTANYL CITRATE (PF) 100 MCG/2ML IJ SOLN
100.0000 ug | INTRAMUSCULAR | Status: DC | PRN
  Administered 2016-01-30 – 2016-02-01 (×2): 100 ug via INTRAVENOUS
  Filled 2016-01-29 (×3): qty 2

## 2016-01-29 MED ORDER — INSULIN DETEMIR 100 UNIT/ML ~~LOC~~ SOLN
10.0000 [IU] | Freq: Every day | SUBCUTANEOUS | Status: DC
  Administered 2016-01-29 – 2016-02-01 (×4): 10 [IU] via SUBCUTANEOUS
  Filled 2016-01-29 (×5): qty 0.1

## 2016-01-29 MED ORDER — SODIUM CHLORIDE 0.9 % IV SOLN
INTRAVENOUS | Status: DC
  Filled 2016-01-29: qty 2.5

## 2016-01-29 MED ORDER — PROPOFOL 1000 MG/100ML IV EMUL
0.0000 ug/kg/min | INTRAVENOUS | Status: DC
  Administered 2016-01-29: 10 ug/kg/min via INTRAVENOUS
  Administered 2016-01-30: 40 ug/kg/min via INTRAVENOUS
  Administered 2016-01-30: 30 ug/kg/min via INTRAVENOUS
  Administered 2016-01-31 (×2): 40 ug/kg/min via INTRAVENOUS
  Administered 2016-01-31: 45 ug/kg/min via INTRAVENOUS
  Administered 2016-01-31 – 2016-02-01 (×2): 40 ug/kg/min via INTRAVENOUS
  Administered 2016-02-01 (×2): 50 ug/kg/min via INTRAVENOUS
  Administered 2016-02-01: 40 ug/kg/min via INTRAVENOUS
  Filled 2016-01-29 (×11): qty 100

## 2016-01-29 MED ORDER — FENTANYL CITRATE (PF) 100 MCG/2ML IJ SOLN
100.0000 ug | INTRAMUSCULAR | Status: DC | PRN
  Administered 2016-01-31 (×2): 100 ug via INTRAVENOUS
  Filled 2016-01-29: qty 2

## 2016-01-29 MED ORDER — CEFEPIME HCL 2 G IJ SOLR
500.0000 mg | Freq: Once | INTRAMUSCULAR | Status: AC
  Administered 2016-01-29: 500 mg via INTRAVENOUS
  Filled 2016-01-29: qty 0.5

## 2016-01-29 NOTE — Progress Notes (Signed)
Pharmacy Antibiotic Note  Tanner Campbell is a 40 y.o. male with ESRD on HD admitted with respiratory failure due to volume overload/COPD to begin empiric abx.  Pharmacy has been consulted for cefepime dosing.  Plan: Will continue Cefepime 500 mg qHD session. Patient is normally Tues, Thurs, and Sat dialyzer however patient has not been on a consistent schedule due to missed sessions. Will follow on renal plan during interdisciplinary rounds.      Height:  (165.1 cm) Weight: 140 lb 14 oz (63.9 kg) IBW/kg (Calculated) : 61.5  Temp (24hrs), Avg:98.8 F (37.1 C), Min:97.8 F (36.6 C), Max:99.7 F (37.6 C)   Recent Labs Lab 01/24/16 2125 01/25/16 0432 01/27/16 0445 01/28/16 0349 01/28/16 0404 01/29/16 0447 01/29/16 0745  WBC 10.5 10.5 11.9* 14.1*  --   --  19.0*  CREATININE 8.96* 9.11* 4.08*  --  5.11* 3.55*  --     Estimated Creatinine Clearance: 23.8 mL/min (by C-G formula based on Cr of 3.55).    Allergies  Allergen Reactions  . Penicillins Other (See Comments)    Reaction: Unknown    Antimicrobials this admission: cefepime 6/25 >>    Dose adjustments this admission:   Microbiology results: 6/24 BCx: NGTD x 2 UCx: pending  6/24 Sputum: GPC/GPR/gram negative coccobacilli (pending)  6/22 MRSA PCR: negative  Thank you for allowing pharmacy to be a part of this patient's care.  [name] D 01/29/2016 8:15 AM

## 2016-01-29 NOTE — Progress Notes (Signed)
[facility]  ROUNDING NOTE   Subjective:   \Patient remains critically ill intubated and sedated Received HD yesterday,  1000 cc was removed Vent: fio2 30%,  Tube feeds are off  Objective:  Vital signs in last 24 hours:  Temp:  [97.8 F (36.6 C)-99.7 F (37.6 C)] 98.7 F (37.1 C) (06/26 0800) Pulse Rate:  [76-96] 96 (06/26 0800) Resp:  [6-21] 15 (06/26 0800) BP: (97-140)/(57-77) 140/77 mmHg (06/26 0800) SpO2:  [91 %-100 %] 100 % (06/26 0800) FiO2 (%):  [30 %] 30 % (06/26 0800) Weight:  [63.9 kg (140 lb 14 oz)-68.6 kg (151 lb 3.8 oz)] 63.9 kg (140 lb 14 oz) (06/26 0450)  Weight change: 2.2 kg (4 lb 13.6 oz) Filed Weights   01/28/16 0400 01/28/16 1035 01/29/16 0450  Weight: 66.4 kg (146 lb 6.2 oz) 68.6 kg (151 lb 3.8 oz) 63.9 kg (140 lb 14 oz)    Intake/Output: I/O last 3 completed shifts: In: 3310 [I.V.:592; NG/GT:2668; IV Piggyback:50] Out: 1000 [Other:1000]   Intake/Output this shift:  Total I/O In: 277.8 [I.V.:5.3; NG/GT:272.5] Out: -   Physical Exam: General: Critically ill  Head: +ETT  Eyes: Anicteric,    Neck: Supple, trachea midline  Lungs:  Bilateral crackles, +wheezes,  Vent assisted  Heart: Regular rate and rhythm  Abdomen:  Soft, nontender  Extremities: No peripheral edema.  Neurologic: sedated  Skin: No lesions  Access: Left arm AVF, good thrill    Basic Metabolic Panel:  Recent Labs Lab 01/24/16 2125 01/25/16 0432 01/26/16 1100 01/26/16 1930 01/27/16 0445 01/27/16 1649 01/28/16 0404 01/29/16 0447 01/29/16 0745  NA 136 138  --   --  141  --  140 139  --   K 6.6* 5.7*  --   --  3.9  --  3.7 3.6  --   CL 99* 99*  --   --  100*  --  97* 97*  --   CO2 19* 21*  --   --  30  --  31 34*  --   GLUCOSE 270* 313*  --   --  88  --  211* 250*  --   BUN 106* 106*  --   --  43*  --  61* 47*  --   CREATININE 8.96* 9.11*  --   --  4.08*  --  5.11* 3.55*  --   CALCIUM 8.5* 8.5*  --   --  8.4*  --  8.5* 8.3*  --   MG  --   --  2.5* 2.0  2.0 2.1  --   --  2.1  PHOS  --   --  6.8* 3.7 3.9 4.0  --  1.6*  --     Liver Function Tests:  Recent Labs Lab 01/29/16 0447  ALBUMIN 2.1*   No results for input(s): LIPASE, AMYLASE in the last 168 hours. No results for input(s): AMMONIA in the last 168 hours.  CBC:  Recent Labs Lab 01/24/16 2125 01/25/16 0432 01/27/16 0445 01/28/16 0349 01/29/16 0745  WBC 10.5 10.5 11.9* 14.1* 19.0*  HGB 10.9* 11.1* 10.7* 10.8* 10.9*  HCT 33.1* 33.8* 33.9* 34.2* 34.6*  MCV 84.3 86.1 87.3 86.9 89.3  PLT 210 187 147* 128* 132*    Cardiac Enzymes:  Recent Labs Lab 01/24/16 2125  TROPONINI 0.04*    BNP: Invalid input(s): POCBNP  CBG:  Recent Labs Lab 01/28/16 1550 01/28/16 2014 01/29/16 0024 01/29/16 0426 01/29/16 0720  GLUCAP 172* 206* 200* 228* 224*  Microbiology: Results for orders placed or performed during the hospital encounter of 01/24/16  MRSA PCR Screening     Status: None   Collection Time: 01/25/16  2:22 AM  Result Value Ref Range Status   MRSA by PCR NEGATIVE NEGATIVE Final    Comment:        The GeneXpert MRSA Assay (FDA approved for NASAL specimens only), is one component of a comprehensive MRSA colonization surveillance program. It is not intended to diagnose MRSA infection nor to guide or monitor treatment for MRSA infections.   Culture, expectorated sputum-assessment     Status: None   Collection Time: 01/27/16  4:45 AM  Result Value Ref Range Status   Specimen Description TRACHEAL ASPIRATE  Final   Special Requests NONE  Final   Sputum evaluation THIS SPECIMEN IS ACCEPTABLE FOR SPUTUM CULTURE  Final   Report Status 01/27/2016 FINAL  Final  Culture, respiratory (NON-Expectorated)     Status: None (Preliminary result)   Collection Time: 01/27/16  4:45 AM  Result Value Ref Range Status   Specimen Description TRACHEAL ASPIRATE  Final   Special Requests NONE Reflexed from [phone]  Final   Gram Stain   Final    ABUNDANT WBC PRESENT,  PREDOMINANTLY PMN NO SQUAMOUS EPITHELIAL CELLS SEEN ABUNDANT GRAM NEGATIVE COCCOBACILLI FEW GRAM POSITIVE COCCI IN PAIRS RARE GRAM POSITIVE RODS    Culture   Final    ABUNDANT GRAM NEGATIVE RODS BETA LACTAMASE NEGATIVE IDENTIFICATION TO FOLLOW Performed at [facility]    Report Status PENDING  Incomplete  Culture, blood (Routine X 2) w Reflex to ID Panel     Status: None (Preliminary result)   Collection Time: 01/27/16  5:18 AM  Result Value Ref Range Status   Specimen Description BLOOD RIGHT ARM  Final   Special Requests BOTTLES DRAWN AEROBIC AND ANAEROBIC 5CC  Final   Culture NO GROWTH 2 DAYS  Final   Report Status PENDING  Incomplete  Culture, blood (Routine X 2) w Reflex to ID Panel     Status: None (Preliminary result)   Collection Time: 01/27/16  5:23 AM  Result Value Ref Range Status   Specimen Description BLOOD RIGHT HAND  Final   Special Requests BOTTLES DRAWN AEROBIC AND ANAEROBIC 5CC  Final   Culture NO GROWTH 2 DAYS  Final   Report Status PENDING  Incomplete    Coagulation Studies: No results for input(s): LABPROT, INR in the last 72 hours.  Urinalysis: No results for input(s): COLORURINE, LABSPEC, PHURINE, GLUCOSEU, HGBUR, BILIRUBINUR, KETONESUR, PROTEINUR, UROBILINOGEN, NITRITE, LEUKOCYTESUR in the last 72 hours.  Invalid input(s): APPERANCEUR    Imaging: Dg Abd 1 View  01/28/2016  CLINICAL DATA:  Constipation and poor urinary output. Hemodialysis patient. EXAM: ABDOMEN - 1 VIEW COMPARISON:  01/26/2016 FINDINGS: The NG tube tip is in the body region of the stomach. The stomach is moderately distended with air. Moderate stool throughout the colon and down into the rectum may suggest constipation. No findings for obstruction or free air. Extensive vascular calcifications are noted vertically involving the renal arteries. Severe chronic lung disease and bilateral pleural effusions. IMPRESSION: Moderate constipation.  No findings for obstruction or  perforation. Electronically Signed   By: Marijo Sanes M.D.   On: 01/28/2016 21:02   Dg Chest Port 1 View  01/29/2016  CLINICAL DATA:  Acute respiratory failure.  Intubated patient. EXAM: PORTABLE CHEST 1 VIEW COMPARISON:  01/28/2016 and 12/22/2015 FINDINGS: Endotracheal tube is 3.7 cm above the carina. Right jugular  central line tip in the SVC region. Nasogastric tube extends into the abdomen but the tip is beyond the image. There continues to be bibasilar chest densities compatible with pleural fluid and atelectasis/consolidation. Few patchy airspace densities may represent pulmonary edema. In addition, there is a rounded opacity along the lateral aspect of the left lung base. Limited evaluation of the cardiac silhouette due to the basilar chest densities. Negative for pneumothorax. Surgical plate in the lower cervical spine. IMPRESSION: Persistent bibasilar chest densities compatible with bilateral pleural effusions and consolidation. Bibasilar chest densities have not significantly changed. However, there is a rounded density at the left lung base. This may represent focal consolidation or loculated pleural fluid. Cannot exclude an underlying lesion in this area. Recommend continued follow-up and a consider CT evaluation if this area does not resolve. Mild pulmonary edema. Support apparatuses as described. Electronically Signed   By: Markus Daft M.D.   On: 01/29/2016 07:54   Dg Chest Port 1 View  01/28/2016  CLINICAL DATA:  Acute respiratory failure, diabetes mellitus, hypertension, COPD, end-stage renal disease EXAM: PORTABLE CHEST 1 VIEW COMPARISON:  Portable exam 0549 hours compared to 01/27/2016 FINDINGS: Tip of endotracheal tube projects 3.4 cm above carina. Nasogastric tube extends into stomach. RIGHT jugular central venous catheter with tip projecting over SVC. Enlargement of cardiac silhouette with vascular congestion. BILATERAL perihilar infiltrates greater on RIGHT likely representing asymmetric  edema. Bibasilar effusions and atelectasis, unable to exclude consolidation in lower lobes. No pneumothorax. Bones demineralized. Prior cervical spine fusion. IMPRESSION: Persistent pulmonary edema with bibasilar effusions and atelectasis as above. Electronically Signed   By: Lavonia Dana M.D.   On: 01/28/2016 08:48     Medications:   . fentaNYL infusion INTRAVENOUS Stopped (01/29/16 0732)   . antiseptic oral rinse  7 mL Mouth Rinse 10 times per day  . atorvastatin  40 mg Oral QPM  . budesonide (PULMICORT) nebulizer solution  0.5 mg Nebulization BID  . calcium acetate  2,001 mg Oral TID WC  . chlorhexidine gluconate (SAGE KIT)  15 mL Mouth Rinse BID  . DULoxetine  30 mg Oral Daily  . epoetin (EPOGEN/PROCRIT) injection  10,000 Units Intravenous Q T,Th,Sa-HD  . feeding supplement (VITAL HIGH PROTEIN)  1,000 mL Per Tube Q24H  . heparin  5,000 Units Subcutaneous Q8H  . hydrocortisone   Topical BID  . insulin aspart  2-6 Units Subcutaneous Q4H  . ipratropium-albuterol  3 mL Nebulization Q4H  . multivitamin  1 tablet Oral Daily  . pantoprazole sodium  40 mg Per Tube QAC breakfast  . sodium chloride flush  3 mL Intravenous Q12H  . sodium polystyrene  30 g Oral Once   sodium chloride, acetaminophen **OR** acetaminophen, albuterol, ceFEPime (MAXIPIME) IV, docusate, fentaNYL, midazolam, ondansetron **OR** ondansetron (ZOFRAN) IV, senna-docusate  Assessment/ Plan:  Mr. Derris Millan is a 39 y.o. white male with End-stage renal disease with hemodialysis Started hemodialysis May 02, 2014, Hypertension, Diabetes mellitus type 2, hyperlipidemia, Multiple sclerosis, COPD, Back surgery, Cholecystectomy, Tobacco use   TTS [facility] [employer]. left arm AVF  1. End Stage Renal Disease: with pulmonary edema and hyperkalemia.  - non compliance with outpatient dialysis TTS Missed last three outpatient treatments. Now with respiratory failure requiring mechanical ventilation.  - emergent  hemodialysis for Thursday 6/22 and Friday 6/23 and Sunday - Plan for next HD tuesday  2. Acute resp failure - multifactorial - pulm edema, possible underlying pneumonia - consider dosing cefepime daily in evening  3. Anemia of chronic  kidney disease: hemoglobin at goal - low dose EPO with HD     LOS: 4 Mirenda Baltazar 6/26/20179:13 AM

## 2016-01-29 NOTE — Progress Notes (Signed)
Results for TUF, LATONA (MRN [id number]) as of 01/29/2016 09:34  Ref. Range 01/28/2016 20:14 01/29/2016 00:24 01/29/2016 04:26 01/29/2016 07:20  Glucose-Capillary Latest Ref Range: 65-99 mg/dL 409 (H) 811 (H) 914 (H) 224 (H)   Patient is ordered ICU Glycemic Control order set Phase 1 at this time. Patient has had 4 subsequent glucose values greater than 200 mg/dl over the past 12 hours and should be transitioned to Phase 2 of ICU Glycemic Control order set at this time. NURSING: please transition patient per protocol which states if 2 subsequent CBGs > 200 mg/dl nursing is to order Phase 2 (IV insulin) and discontinue Phase 1 at that time.  Spoke with Asher Muir, RN and asked that patient transition to Phase 2 of protocol at this time. Will continue to follow.  Thanks, Orlando Penner, RN, MSN, CDE Diabetes Coordinator Inpatient Diabetes Program [phone] (Team Pager from 8am to 5pm) [phone] ([facility] office) [phone] [facility] office) [phone] [facility] office)

## 2016-01-29 NOTE — Plan of Care (Signed)
Problem: Education: Goal: Knowledge of [facility] Education information/materials will improve Outcome: Not Progressing Pt. RASS +2 to -2, unable to follow commands O/N  Problem: Safety: Goal: Ability to remain free from injury will improve Outcome: Progressing Pt. Remains on bedrest, no injuries O/N  Problem: Health Behavior/Discharge Planning: Goal: Ability to manage health-related needs will improve Outcome: Not Progressing Pt. Unable to assist w/ ADL's or repositioning  Problem: Pain Managment: Goal: General experience of comfort will improve Outcome: Progressing Pt. On fentanyl gtt given PRN boluses throughout night to maintain comfort  Problem: Physical Regulation: Goal: Ability to maintain clinical measurements within normal limits will improve Outcome: Progressing VSS O/N Goal: Will remain free from infection Outcome: Not Progressing Pt. Temp remains at 99 O/N, no WBC level this AM, but yesterday's was increased and pt. Has moderate to copious tan/yellow thick secretions.  Problem: Skin Integrity: Goal: Risk for impaired skin integrity will decrease Outcome: Not Progressing Pt. Unable to reposition self, braden score: 16  Problem: Tissue Perfusion: Goal: Risk factors for ineffective tissue perfusion will decrease Outcome: Not Progressing Pt. Has become anuric, bladder scanned after > 24 hours with no UOP and only 46 mL present. Ms Tanner Bank, NP aware  Problem: Activity: Goal: Risk for activity intolerance will decrease Outcome: Not Progressing PT. Becomes very agitated w/ mvmt, repositioning, medication administration and mouth care  Problem: Fluid Volume: Goal: Ability to maintain a balanced intake and output will improve Outcome: Not Progressing Pt. Anuric. Received HD 6/25, scheduled for TTSa, remains on 1200 mL fluid restriction  Problem: Nutrition: Goal: Adequate nutrition will be maintained Outcome: Progressing Pt. Receiving VHP @ 65  mL/h  Problem: Bowel/Gastric: Goal: Will not experience complications related to bowel motility Outcome: Not Progressing Pt. LBM 6/23, given colace and senna PRN's, abd distended, abd XR showed moderate constipation. No BM O/N, did not observe and passing of gas, active bowel sounds  Problem: Education: Goal: Knowledge of disease and its progression will improve Outcome: Not Progressing Pt. RASS: +2 to -1  Problem: Coping: Goal: Level of anxiety will decrease Outcome: Not Progressing Pt. Requiring fentanyl PRN boluses, pt. Becomes agitated with most interventions  Problem: Nutritional: Goal: Intake of prescribed amount of daily calories will improve Outcome: Progressing Pt. On VHP @ 65 mL/h     Problem: Respiratory: Goal: Ability to maintain a clear airway and adequate ventilation will improve Outcome: Progressing Pt. On PRVC O/N w/o complications, FiO2 30%  Problem: Role Relationship: Goal: Method of communication will improve Outcome: Not Progressing Pt. Unable to follow commands, intermittent eye contact  Problem: Skin Integrity Impairment Risk: Goal: Skin integrity will improve Outcome: Progressing BLE has some kind of rash/ scabbed pustules. Ms. Tanner Bank NP ordered topical steriod tx.

## 2016-01-29 NOTE — Progress Notes (Signed)
[facility] Critical Care Medicine Progess Note  Name: Tanner Campbell MRN: [id number] DOB: May 16, 1976     ASSESSMENT/PLAN    DISCUSSION: 40 year old male presenting with acute on chronic respiratory failure secondary to pulmonary edema,  COPD exacerbation, hypokalemia, and end-stage renal disease on hemodialysis.  ASSESSMENT / PLAN:  PULMONARY A: Acute hypercarbic and hypoxic respiratory failure secondary to volume overload. Pulmonary edema., Chest x-ray reviewed today. Continued right sided effusion, continued left basal atelectasis when compared with previous. Acute COPD exacerbation P:   -Weaning trial today, we will extubate if tolerated. -Daily chest x-ray. -ABG when necessary -VAP Protocol  -Daily weaning trials as tolerated  CARDIOVASCULAR A:  History of hypertension-BP is stable P:  -Hemodynamic monitoring per ICU protocol -Continue to hold home blood pressure medications  RENAL A:   End-stage renal disease on dialysis Hyperkalemia P:   -Nephrology following, hemodialysis per nephrology -Monitor and correct electrolyte imbalance  GASTROINTESTINAL A:   Constipation P:   -PPI for stress ulcer prophylaxis -Aspiration precautions -Tube feeds as tolerated -Miralax and lactulose x 1 today   HEMATOLOGIC A:   Anemia of chronic disease-hemoglobin and hematocrit stable Thrombocytopenia P:  -Trend CBC  INFECTIOUS A:   Status post Fever , continued leukocytosis- sputum culture with abundant GPB and GPC in pairs P:   -Continue cefepime as patient has penicillin  allergy -f/u final cultures  CULTURES: 01/27/2016 Blood cultures 2. Sputum culture: Prelim-ABUNDANT GRAM NEGATIVE COCCOBACILLI  FEW GRAM POSITIVE COCCI IN PAIRS RARE GRAM POSITIVE RODS   ANTIBIOTICS: None  LINES/TUBES: CVC Triple Lumen 01/26/16 Right Internal jugular ET tube placed, 01/26/2016 OG tube placed 01/26/2016   ENDOCRINE A:   Type 2 diabetes mellitus   P:    -Point-of-care glucose testing with Sliding-scale insulin coverage  NEUROLOGIC A:   Acute hypoxic/hypercarbic encephalopathy P:   RASS goal: -1 to -2 -Treated acidosis and hypercarbia. -Continue vent support -fentanyl and Versed for vent sedation and comfort  Disposition and Family update:   STUDIES:  None   SIGNIFICANT EVENTS: 01/24/2016: ED with pulmonary edema, failed BiPAP, intubated  Best Practice: Code Status: Full code Diet: Nothing by mouth GI prophylaxis: PPI VTE prophylaxis:  SCD's /heparin  ------------------------------  ADMISSION DATE:  01/24/2016   CONSULTATION DATE:  01/26/2016  REFERRING MD :  Dr Carole Binning   Interim history. Remains intubated and sedated. Persistent copious tracheal secretions. No fever but mild abdominal distension noted. Abdominal x-ray suggest constipation. Laxatives given with no effect  VITAL SIGNS: Temp:  [97.8 F (36.6 C)-99.7 F (37.6 C)] 99.7 F (37.6 C) (06/26 0400) Pulse Rate:  [76-88] 80 (06/26 0600) Resp:  [6-21] 15 (06/26 0600) BP: (97-139)/(57-72) 132/68 mmHg (06/26 0600) SpO2:  [91 %-100 %] 100 % (06/26 0600) FiO2 (%):  [30 %] 30 % (06/26 0441) Weight:  [140 lb 14 oz (63.9 kg)-151 lb 3.8 oz (68.6 kg)] 140 lb 14 oz (63.9 kg) (06/26 0450)  PHYSICAL EXAMINATION: General: Sedated Neuro: Awake, follows some commands, moves all extremities to noxious stimulus HEENT: PERRLA, oral mucosa moist and pink Cardiovascular: RRR, S1/S2, no MRG Lungs: Bilateral airflow, diffuse rhonchi in upper lung fields Abdomen: Mildly distended, hypoactive bowel sounds, palpation reveals no organomegaly Musculoskeletal:  No visible joint deformities. Extremities: +2 pulses, no edema Skin: Skin is warm and dry without any open lesions   Recent Labs Lab 01/27/16 0445 01/28/16 0404 01/29/16 0447  NA 141 140 139  K 3.9 3.7 3.6  CL 100* 97* 97*  CO2 30 31 34*  BUN 43*  61* 47*  CREATININE 4.08* 5.11* 3.55*  GLUCOSE 88 211* 250*     Recent Labs Lab 01/25/16 0432 01/27/16 0445 01/28/16 0349  HGB 11.1* 10.7* 10.8*  HCT 33.8* 33.9* 34.2*  WBC 10.5 11.9* 14.1*  PLT 187 147* 128*   Dg Abd 1 View  01/28/2016  CLINICAL DATA:  Constipation and poor urinary output. Hemodialysis patient. EXAM: ABDOMEN - 1 VIEW COMPARISON:  01/26/2016 FINDINGS: The NG tube tip is in the body region of the stomach. The stomach is moderately distended with air. Moderate stool throughout the colon and down into the rectum may suggest constipation. No findings for obstruction or free air. Extensive vascular calcifications are noted vertically involving the renal arteries. Severe chronic lung disease and bilateral pleural effusions. IMPRESSION: Moderate constipation.  No findings for obstruction or perforation. Electronically Signed   By: Rudie Meyer M.D.   On: 01/28/2016 21:02   Dg Chest Port 1 View  01/28/2016  CLINICAL DATA:  Acute respiratory failure, diabetes mellitus, hypertension, COPD, end-stage renal disease EXAM: PORTABLE CHEST 1 VIEW COMPARISON:  Portable exam 0549 hours compared to 01/27/2016 FINDINGS: Tip of endotracheal tube projects 3.4 cm above carina. Nasogastric tube extends into stomach. RIGHT jugular central venous catheter with tip projecting over SVC. Enlargement of cardiac silhouette with vascular congestion. BILATERAL perihilar infiltrates greater on RIGHT likely representing asymmetric edema. Bibasilar effusions and atelectasis, unable to exclude consolidation in lower lobes. No pneumothorax. Bones demineralized. Prior cervical spine fusion. IMPRESSION: Persistent pulmonary edema with bibasilar effusions and atelectasis as above. Electronically Signed   By: Ulyses Southward M.D.   On: 01/28/2016 08:48       Critical care time spent examining patient, establishing treatment plan, managing vent, reviewing history and labs, CXR, and ABG interpretation is 40 minutes  [facility] ANP-BC Pulmonary and Critical Care  Medicine [facility] Pager [phone]   01/29/2016, 6:56 AM  I performed a history and physical examination with NP to cough, I agree with the assessment and plan as detailed above. Patient was tried on weaning trial today, patient appears sedated, will await better mental status and then try again. Wells Guiles M.D.  Critical Care Attestation.  I have personally obtained a history, examined the patient, evaluated laboratory and imaging results, formulated the assessment and plan and placed orders. The Patient requires high complexity decision making for assessment and support, frequent evaluation and titration of therapies, application of advanced monitoring technologies and extensive interpretation of multiple databases. The patient has critical illness that could lead imminently to failure of 1 or more organ systems and requires the highest level of physician preparedness to intervene.  Critical Care Time devoted to patient care services described in this note is 40 minutes and is exclusive of time spent in procedures.

## 2016-01-29 NOTE — Progress Notes (Addendum)
Pt bs 200 or greater three consecutive times, per ICU hyperglycemic protocol, phase 2 of protocol should be initiated requiring insulin gtt. S/w Dr. Ardyth Man in rounds, per verbal order phase 2 of protocol will be dc'd, pt ss coverage will be increased and long acting insulin added to [facility].

## 2016-01-29 NOTE — Progress Notes (Signed)
Chaplain rounded the unit to provide a compassionate presence and support for the patient. The patient appeared to be sleeping and a silent prayer was said. Tanner Campbell [phone]

## 2016-01-30 ENCOUNTER — Inpatient Hospital Stay: Payer: Medicare Other

## 2016-01-30 LAB — CBC
HEMATOCRIT: 30.7 % — AB (ref 40.0–52.0)
HEMOGLOBIN: 9.9 g/dL — AB (ref 13.0–18.0)
MCH: 28 pg (ref 26.0–34.0)
MCHC: 32.3 g/dL (ref 32.0–36.0)
MCV: 86.7 fL (ref 80.0–100.0)
Platelets: 133 10*3/uL — ABNORMAL LOW (ref 150–440)
RBC: 3.54 MIL/uL — AB (ref 4.40–5.90)
RDW: 17 % — ABNORMAL HIGH (ref 11.5–14.5)
WBC: 18.1 10*3/uL — ABNORMAL HIGH (ref 3.8–10.6)

## 2016-01-30 LAB — ALPHA-1 ANTITRYPSIN PHENOTYPE: A-1 Antitrypsin, Ser: 197 mg/dL (ref 90–200)

## 2016-01-30 LAB — BASIC METABOLIC PANEL
Anion gap: 9 (ref 5–15)
BUN: 71 mg/dL — AB (ref 6–20)
CO2: 30 mmol/L (ref 22–32)
CREATININE: 4.78 mg/dL — AB (ref 0.61–1.24)
Calcium: 8.1 mg/dL — ABNORMAL LOW (ref 8.9–10.3)
Chloride: 97 mmol/L — ABNORMAL LOW (ref 101–111)
GFR calc Af Amer: 16 mL/min — ABNORMAL LOW (ref >60)
GFR, EST NON AFRICAN AMERICAN: 14 mL/min — AB (ref >60)
Glucose, Bld: 126 mg/dL — ABNORMAL HIGH (ref 65–99)
POTASSIUM: 4.3 mmol/L (ref 3.5–5.1)
Sodium: 136 mmol/L (ref 135–145)

## 2016-01-30 LAB — GLUCOSE, CAPILLARY
GLUCOSE-CAPILLARY: 134 mg/dL — AB (ref 65–99)
GLUCOSE-CAPILLARY: 160 mg/dL — AB (ref 65–99)
GLUCOSE-CAPILLARY: 143 mg/dL — AB (ref 65–99)
GLUCOSE-CAPILLARY: 216 mg/dL — AB (ref 65–99)
Glucose-Capillary: 121 mg/dL — ABNORMAL HIGH (ref 65–99)
Glucose-Capillary: 139 mg/dL — ABNORMAL HIGH (ref 65–99)
Glucose-Capillary: 182 mg/dL — ABNORMAL HIGH (ref 65–99)
Glucose-Capillary: 236 mg/dL — ABNORMAL HIGH (ref 65–99)

## 2016-01-30 LAB — PHOSPHORUS: PHOSPHORUS: 1.8 mg/dL — AB (ref 2.5–4.6)

## 2016-01-30 LAB — BLOOD GAS, ARTERIAL
Acid-Base Excess: 5.8 mmol/L — ABNORMAL HIGH (ref 0.0–3.0)
Allens test (pass/fail): POSITIVE — AB
Bicarbonate: 31.9 mEq/L — ABNORMAL HIGH (ref 21.0–28.0)
FIO2: 0.3
MECHANICAL RATE: 15
O2 Saturation: 90.9 %
PCO2 ART: 54 mmHg — AB (ref 32.0–48.0)
PH ART: 7.38 (ref 7.350–7.450)
Patient temperature: 37
VT: 500 mL
pO2, Arterial: 62 mmHg — ABNORMAL LOW (ref 83.0–108.0)

## 2016-01-30 MED ORDER — LACTULOSE 10 GM/15ML PO SOLN
20.0000 g | Freq: Two times a day (BID) | ORAL | Status: DC
  Administered 2016-01-30 (×2): 20 g via ORAL
  Filled 2016-01-30 (×2): qty 30

## 2016-01-30 MED ORDER — EPOETIN ALFA 10000 UNIT/ML IJ SOLN
4000.0000 [IU] | INTRAMUSCULAR | Status: DC
  Administered 2016-01-30 – 2016-02-01 (×2): 4000 [IU] via INTRAVENOUS
  Filled 2016-01-30: qty 1
  Filled 2016-01-30: qty 0.4
  Filled 2016-01-30 (×2): qty 1

## 2016-01-30 MED ORDER — DEXTROSE 5 % IV SOLN
1.0000 g | INTRAVENOUS | Status: DC
  Administered 2016-01-30 – 2016-02-01 (×3): 1 g via INTRAVENOUS
  Filled 2016-01-30 (×3): qty 10

## 2016-01-30 MED ORDER — POLYETHYLENE GLYCOL 3350 17 G PO PACK
17.0000 g | PACK | Freq: Two times a day (BID) | ORAL | Status: DC
  Administered 2016-01-30 (×2): 17 g via ORAL
  Filled 2016-01-30 (×2): qty 1

## 2016-01-30 NOTE — Progress Notes (Signed)
Post dialysis 

## 2016-01-30 NOTE — Progress Notes (Signed)
Dialysis started 

## 2016-01-30 NOTE — Progress Notes (Signed)
Nutrition Follow-up  DOCUMENTATION CODES:   Not applicable  INTERVENTION:  -If unable to extubate today, recommend continuing current TF regimen, continue PEPuP; if pt remains on diprivan, will need to assess goal rate based on calories from diprivan -Phosphorus remains low, recommend supplementing -No BM in several days, MD adjusting bowel regimen   NUTRITION DIAGNOSIS:   Inadequate oral intake related to acute illness as evidenced by NPO status.  Being addressed via TF  GOAL:   Provide needs based on ASPEN/SCCM guidelines  MONITOR:   TF tolerance, Vent status, Labs, Weight trends  REASON FOR ASSESSMENT:   Consult Enteral/tube feeding initiation and management  ASSESSMENT:    40 yo male admitted with acute respiratory failure with volume overload and hyperkalemia; pt with hx of ESRD on HD and missed last 3 outpatient dialysis treatment  Pt receiving dialysis this AM Patient is currently intubated on ventilator support, failed multiple vent weans yesterday, started on diprivan at shift change, planning weaning trial again post HD today MV: 7.8 L/min Temp (24hrs), Avg:98.5 F (36.9 C), Min:98.1 F (36.7 C), Max:98.9 F (37.2 C)  Propofol: started last night  Diet Order:  Diet NPO time specified  Skin:  Reviewed, no issues  Last BM:  6/23  Labs: phosphorus 1.8   Meds: ss novolog, levemir, diprivan  Height:   Ht Readings from Last 1 Encounters:  01/26/16 5\' 5"  (1.651 m)    Weight:   Wt Readings from Last 1 Encounters:  01/30/16 156 lb 15.5 oz (71.2 kg)    BMI:  Body mass index is 26.12 kg/(m^2).  Estimated Nutritional Needs:   Kcal:  1646 kcals using wt of 73.2 kg  Protein:  110-146 g  Fluid:  1000 mL plus UOP  EDUCATION NEEDS:   No education needs identified at this time  Romelle Starcher MS, RD, LDN [phone] Pager  [phone] Weekend/On-Call Pager

## 2016-01-30 NOTE — Care Management (Signed)
Barrier to discharge: remains intubated and sedated. Started on Propofol yesterday due to increased agitation with dialysis.Weaning in process and attempt to extubate later today.

## 2016-01-30 NOTE — Progress Notes (Signed)
Pre Dialysis 

## 2016-01-30 NOTE — Progress Notes (Signed)
[facility]  ROUNDING NOTE   Subjective:   \Patient remains critically ill intubated and sedated Plan for HD this morning Vent: fio2 30%,  Tube feeds are off  Objective:  Vital signs in last 24 hours:  Temp:  [98.1 F (36.7 C)-98.9 F (37.2 C)] 98.6 F (37 C) (06/27 0800) Pulse Rate:  [76-89] 89 (06/27 0800) Resp:  [12-19] 15 (06/27 0800) BP: (126-165)/(66-88) 165/88 mmHg (06/27 0800) SpO2:  [100 %] 100 % (06/27 0800) FiO2 (%):  [30 %] 30 % (06/27 0755) Weight:  [66.2 kg (145 lb 15.1 oz)] 66.2 kg (145 lb 15.1 oz) (06/27 0400)  Weight change: -2.4 kg (-5 lb 4.7 oz) Filed Weights   01/28/16 1035 01/29/16 0450 01/30/16 0400  Weight: 68.6 kg (151 lb 3.8 oz) 63.9 kg (140 lb 14 oz) 66.2 kg (145 lb 15.1 oz)    Intake/Output: I/O last 3 completed shifts: In: 2172 [I.V.:247.6; NG/GT:1824.4; IV Piggyback:100] Out: -    Intake/Output this shift:  Total I/O In: 181.9 [I.V.:61.9; NG/GT:120] Out: -   Physical Exam: General: Critically ill  Head: +ETT  Eyes: Anicteric,    Neck: Supple, trachea midline  Lungs:  Vent assisted  Heart: Regular rate and rhythm  Abdomen:  Soft, nontender  Extremities: No peripheral edema.  Neurologic: sedated  Skin: No lesions  Access: Left arm AVF, good thrill    Basic Metabolic Panel:  Recent Labs Lab 01/25/16 0432  01/26/16 1100 01/26/16 1930 01/27/16 0445 01/27/16 1649 01/28/16 0404 01/29/16 0447 01/29/16 0745 01/30/16 0416  NA 138  --   --   --  141  --  140 139  --  136  K 5.7*  --   --   --  3.9  --  3.7 3.6  --  4.3  CL 99*  --   --   --  100*  --  97* 97*  --  97*  CO2 21*  --   --   --  30  --  31 34*  --  30  GLUCOSE 313*  --   --   --  88  --  211* 250*  --  126*  BUN 106*  --   --   --  43*  --  61* 47*  --  71*  CREATININE 9.11*  --   --   --  4.08*  --  5.11* 3.55*  --  4.78*  CALCIUM 8.5*  --   --   --  8.4*  --  8.5* 8.3*  --  8.1*  MG  --   --  2.5* 2.0 2.0 2.1  --   --  2.1  --   PHOS  --   < >  6.8* 3.7 3.9 4.0  --  1.6*  --  1.8*  < > = values in this interval not displayed.  Liver Function Tests:  Recent Labs Lab 01/29/16 0447  ALBUMIN 2.1*   No results for input(s): LIPASE, AMYLASE in the last 168 hours. No results for input(s): AMMONIA in the last 168 hours.  CBC:  Recent Labs Lab 01/25/16 0432 01/27/16 0445 01/28/16 0349 01/29/16 0745 01/30/16 0416  WBC 10.5 11.9* 14.1* 19.0* 18.1*  HGB 11.1* 10.7* 10.8* 10.9* 9.9*  HCT 33.8* 33.9* 34.2* 34.6* 30.7*  MCV 86.1 87.3 86.9 89.3 86.7  PLT 187 147* 128* 132* 133*    Cardiac Enzymes:  Recent Labs Lab 01/24/16 2125  TROPONINI 0.04*    BNP: Invalid input(s): POCBNP  CBG:  Recent Labs Lab 01/29/16 1616 01/29/16 1949 01/30/16 0003 01/30/16 0408 01/30/16 0719  GLUCAP 141* 169* 121* 23* 139*    Microbiology: Results for orders placed or performed during the hospital encounter of 01/24/16  MRSA PCR Screening     Status: None   Collection Time: 01/25/16  2:22 AM  Result Value Ref Range Status   MRSA by PCR NEGATIVE NEGATIVE Final    Comment:        The GeneXpert MRSA Assay (FDA approved for NASAL specimens only), is one component of a comprehensive MRSA colonization surveillance program. It is not intended to diagnose MRSA infection nor to guide or monitor treatment for MRSA infections.   Culture, expectorated sputum-assessment     Status: None   Collection Time: 01/27/16  4:45 AM  Result Value Ref Range Status   Specimen Description TRACHEAL ASPIRATE  Final   Special Requests NONE  Final   Sputum evaluation THIS SPECIMEN IS ACCEPTABLE FOR SPUTUM CULTURE  Final   Report Status 01/27/2016 FINAL  Final  Culture, respiratory (NON-Expectorated)     Status: None   Collection Time: 01/27/16  4:45 AM  Result Value Ref Range Status   Specimen Description TRACHEAL ASPIRATE  Final   Special Requests NONE Reflexed from [phone]  Final   Gram Stain   Final    ABUNDANT WBC PRESENT, PREDOMINANTLY  PMN NO SQUAMOUS EPITHELIAL CELLS SEEN ABUNDANT GRAM NEGATIVE COCCOBACILLI FEW GRAM POSITIVE COCCI IN PAIRS RARE GRAM POSITIVE RODS    Culture   Final    ABUNDANT HAEMOPHILUS INFLUENZAE BETA LACTAMASE NEGATIVE Performed at [facility]    Report Status 01/29/2016 FINAL  Final  Culture, blood (Routine X 2) w Reflex to ID Panel     Status: None (Preliminary result)   Collection Time: 01/27/16  5:18 AM  Result Value Ref Range Status   Specimen Description BLOOD RIGHT ARM  Final   Special Requests BOTTLES DRAWN AEROBIC AND ANAEROBIC 5CC  Final   Culture NO GROWTH 3 DAYS  Final   Report Status PENDING  Incomplete  Culture, blood (Routine X 2) w Reflex to ID Panel     Status: None (Preliminary result)   Collection Time: 01/27/16  5:23 AM  Result Value Ref Range Status   Specimen Description BLOOD RIGHT HAND  Final   Special Requests BOTTLES DRAWN AEROBIC AND ANAEROBIC 5CC  Final   Culture NO GROWTH 3 DAYS  Final   Report Status PENDING  Incomplete    Coagulation Studies: No results for input(s): LABPROT, INR in the last 72 hours.  Urinalysis: No results for input(s): COLORURINE, LABSPEC, PHURINE, GLUCOSEU, HGBUR, BILIRUBINUR, KETONESUR, PROTEINUR, UROBILINOGEN, NITRITE, LEUKOCYTESUR in the last 72 hours.  Invalid input(s): APPERANCEUR    Imaging: Dg Chest 1 View  01/30/2016  CLINICAL DATA:  Dyspnea EXAM: CHEST 1 VIEW COMPARISON:  Yesterday FINDINGS: Endotracheal tube tip between the clavicular heads and carina. Right IJ line with tip at the upper cavoatrial junction. An orogastric tube reaches stomach that is partially gas distended. Bilateral pleural effusion, moderate volume. Chronic cardiomegaly. Bibasilar lung opacity. No edema seen in the apical lungs. Negative for pneumothorax. IMPRESSION: 1. Stable, unremarkable tube and line positioning. 2. Bilateral moderate pleural effusions with basilar atelectasis or pneumonia. Electronically Signed   By: Monte Fantasia M.D.   On:  01/30/2016 07:32   Dg Abd 1 View  01/28/2016  CLINICAL DATA:  Constipation and poor urinary output. Hemodialysis patient. EXAM: ABDOMEN - 1 VIEW COMPARISON:  01/26/2016  FINDINGS: The NG tube tip is in the body region of the stomach. The stomach is moderately distended with air. Moderate stool throughout the colon and down into the rectum may suggest constipation. No findings for obstruction or free air. Extensive vascular calcifications are noted vertically involving the renal arteries. Severe chronic lung disease and bilateral pleural effusions. IMPRESSION: Moderate constipation.  No findings for obstruction or perforation. Electronically Signed   By: Marijo Sanes M.D.   On: 01/28/2016 21:02   Dg Chest Port 1 View  01/29/2016  CLINICAL DATA:  Acute respiratory failure.  Intubated patient. EXAM: PORTABLE CHEST 1 VIEW COMPARISON:  01/28/2016 and 12/22/2015 FINDINGS: Endotracheal tube is 3.7 cm above the carina. Right jugular central line tip in the SVC region. Nasogastric tube extends into the abdomen but the tip is beyond the image. There continues to be bibasilar chest densities compatible with pleural fluid and atelectasis/consolidation. Few patchy airspace densities may represent pulmonary edema. In addition, there is a rounded opacity along the lateral aspect of the left lung base. Limited evaluation of the cardiac silhouette due to the basilar chest densities. Negative for pneumothorax. Surgical plate in the lower cervical spine. IMPRESSION: Persistent bibasilar chest densities compatible with bilateral pleural effusions and consolidation. Bibasilar chest densities have not significantly changed. However, there is a rounded density at the left lung base. This may represent focal consolidation or loculated pleural fluid. Cannot exclude an underlying lesion in this area. Recommend continued follow-up and a consider CT evaluation if this area does not resolve. Mild pulmonary edema. Support apparatuses as  described. Electronically Signed   By: Markus Daft M.D.   On: 01/29/2016 07:54     Medications:   . propofol (DIPRIVAN) infusion 5 mcg/kg/min (01/30/16 0802)   . antiseptic oral rinse  7 mL Mouth Rinse 10 times per day  . atorvastatin  40 mg Oral QPM  . budesonide (PULMICORT) nebulizer solution  0.5 mg Nebulization BID  . chlorhexidine gluconate (SAGE KIT)  15 mL Mouth Rinse BID  . DULoxetine  30 mg Oral Daily  . epoetin (EPOGEN/PROCRIT) injection  10,000 Units Intravenous Q T,Th,Sa-HD  . feeding supplement (VITAL HIGH PROTEIN)  1,000 mL Per Tube Q24H  . heparin  5,000 Units Subcutaneous Q8H  . hydrocortisone   Topical BID  . insulin aspart  0-20 Units Subcutaneous Q4H  . insulin detemir  10 Units Subcutaneous Daily  . ipratropium-albuterol  3 mL Nebulization Q4H  . multivitamin  1 tablet Oral Daily  . pantoprazole sodium  40 mg Per Tube QAC breakfast  . sodium chloride flush  3 mL Intravenous Q12H  . sodium polystyrene  30 g Oral Once   sodium chloride, acetaminophen **OR** acetaminophen, albuterol, ceFEPime (MAXIPIME) IV, docusate, fentaNYL, fentaNYL (SUBLIMAZE) injection, fentaNYL (SUBLIMAZE) injection, midazolam, ondansetron **OR** ondansetron (ZOFRAN) IV, senna-docusate  Assessment/ Plan:  Mr. Kaston Faughn is a 40 y.o. white male with End-stage renal disease with hemodialysis Started hemodialysis May 02, 2014, Hypertension, Diabetes mellitus type 2, hyperlipidemia, Multiple sclerosis, COPD, Back surgery, Cholecystectomy, Tobacco use   TTS [facility] [employer]. left arm AVF  1. End Stage Renal Disease: with pulmonary edema and hyperkalemia.  - non compliance with outpatient dialysis TTS Missed last three outpatient treatments. Now with respiratory failure requiring mechanical ventilation.  - emergent hemodialysis for Thursday 6/22 and Friday 6/23 and Sunday - Plan for routine HD today - UF goal 1.5-2 L  2. Acute resp failure - multifactorial - pulm edema,  possible underlying pneumonia - consider  dosing cefepime daily in evening  3. Anemia of chronic kidney disease: hemoglobin at goal - low dose EPO with HD     LOS: 5 Shomari Scicchitano 6/27/20179:07 AM

## 2016-01-30 NOTE — Progress Notes (Signed)
[facility] Critical Care Medicine Progess Note  Name: Tanner Campbell MRN: [id number] DOB: Apr 09, 1976     ASSESSMENT/PLAN    DISCUSSION: 40 year old male presenting with acute on chronic respiratory failure secondary to pulmonary edema,  COPD exacerbation, hypokalemia, and end-stage renal disease on hemodialysis.  ASSESSMENT / PLAN:  PULMONARY A: Acute hypercarbic and hypoxic respiratory failure secondary to volume overload. Pulmonary edema., Chest x-ray reviewed today. Continued right sided effusion, continued left basal atelectasis when compared with previous. Acute COPD exacerbation -Review blood gas 7.38/54/62/31.9-consistent with chronic hypercapnic respiratory failure. Review of chest x-ray images from 6/27, in comparison with previous, there appears to be continued Bilateral basilar atelectasis and pleural effusions -Vent settings reviewed, PRVC/15/500/30%  P:   -Weaning trial today after dialysis we will extubate if tolerated. -Daily chest x-ray. -ABG when necessary -VAP Protocol  -Daily weaning trials as tolerated  CARDIOVASCULAR A:  History of hypertension-BP is stable P:  -Hemodynamic monitoring per ICU protocol -Continue to hold home blood pressure medications  RENAL A:   End-stage renal disease on dialysis Hyperkalemia P:   -Nephrology following, hemodialysis per nephrology -Monitor and correct electrolyte imbalance  GASTROINTESTINAL A:   Constipation P:   -PPI for stress ulcer prophylaxis -Aspiration precautions -Tube feeds as tolerated -Miralax and lactulose x 1 today   HEMATOLOGIC A:   Anemia of chronic disease-hemoglobin and hematocrit stable Thrombocytopenia P:  -Trend CBC  INFECTIOUS A:   Status post Fever , continued leukocytosis- sputum culture with abundant GPB and GPC in pairs P:   -Continue cefepime as patient has penicillin  allergy -f/u final cultures, will de-escalate antibiotics.  CULTURES: MRSA screen 6/22: Blood  cultures 2 negative Sputum culture: 6/24: Haemophilus influenza  ANTIBIOTICS: cefepime 6/25 >>   LINES/TUBES: CVC Triple Lumen 01/26/16 Right Internal jugular ET tube placed, 01/26/2016 OG tube placed 01/26/2016   ENDOCRINE A:   Type 2 diabetes mellitus, glucose, appears adequately controlled.   P:   -Point-of-care glucose testing with Sliding-scale insulin coverage  NEUROLOGIC A:   Acute hypoxic/hypercarbic encephalopathy P:   RASS goal: -1 to -2 -Treated acidosis and hypercarbia. -Continue vent support -Propofol for vent sedation and comfort  Disposition and Family update:   STUDIES:  None   SIGNIFICANT EVENTS: 01/24/2016: ED with pulmonary edema, failed BiPAP, intubated  Best Practice: Code Status: Full code Diet: Nothing by mouth GI prophylaxis: PPI VTE prophylaxis:  SCD's /heparin  ------------------------------  ADMISSION DATE:  01/24/2016   CONSULTATION DATE:  01/26/2016  REFERRING MD :  Dr Carole Binning   Interim history. Remains intubated and sedated. No new complaints today.  VITAL SIGNS: Temp:  [98.1 F (36.7 C)-98.9 F (37.2 C)] 98.6 F (37 C) (06/27 0800) Pulse Rate:  [76-89] 89 (06/27 0800) Resp:  [12-19] 15 (06/27 0800) BP: (126-165)/(66-88) 165/88 mmHg (06/27 0800) SpO2:  [100 %] 100 % (06/27 0800) FiO2 (%):  [30 %] 30 % (06/27 0755) Weight:  [145 lb 15.1 oz (66.2 kg)] 145 lb 15.1 oz (66.2 kg) (06/27 0400)  PHYSICAL EXAMINATION: General: Sedated Neuro: Awake, follows some commands, moves all extremities to noxious stimulus HEENT: PERRLA, oral mucosa moist and pink Cardiovascular: RRR, S1/S2, no MRG Lungs: Bilateral airflow, diffuse rhonchi in upper lung fields Abdomen: Mildly distended, hypoactive bowel sounds, palpation reveals no organomegaly Musculoskeletal:  No visible joint deformities. Extremities: +2 pulses, no edema Skin: Skin is warm and dry without any open lesions   Recent Labs Lab 01/28/16 0404 01/29/16 0447  01/30/16 0416  NA 140 139 136  K 3.7  3.6 4.3  CL 97* 97* 97*  CO2 31 34* 30  BUN 61* 47* 71*  CREATININE 5.11* 3.55* 4.78*  GLUCOSE 211* 250* 126*    Recent Labs Lab 01/28/16 0349 01/29/16 0745 01/30/16 0416  HGB 10.8* 10.9* 9.9*  HCT 34.2* 34.6* 30.7*  WBC 14.1* 19.0* 18.1*  PLT 128* 132* 133*   Dg Chest 1 View  01/30/2016  CLINICAL DATA:  Dyspnea EXAM: CHEST 1 VIEW COMPARISON:  Yesterday FINDINGS: Endotracheal tube tip between the clavicular heads and carina. Right IJ line with tip at the upper cavoatrial junction. An orogastric tube reaches stomach that is partially gas distended. Bilateral pleural effusion, moderate volume. Chronic cardiomegaly. Bibasilar lung opacity. No edema seen in the apical lungs. Negative for pneumothorax. IMPRESSION: 1. Stable, unremarkable tube and line positioning. 2. Bilateral moderate pleural effusions with basilar atelectasis or pneumonia. Electronically Signed   By: Marnee Spring M.D.   On: 01/30/2016 07:32   Dg Abd 1 View  01/28/2016  CLINICAL DATA:  Constipation and poor urinary output. Hemodialysis patient. EXAM: ABDOMEN - 1 VIEW COMPARISON:  01/26/2016 FINDINGS: The NG tube tip is in the body region of the stomach. The stomach is moderately distended with air. Moderate stool throughout the colon and down into the rectum may suggest constipation. No findings for obstruction or free air. Extensive vascular calcifications are noted vertically involving the renal arteries. Severe chronic lung disease and bilateral pleural effusions. IMPRESSION: Moderate constipation.  No findings for obstruction or perforation. Electronically Signed   By: Rudie Meyer M.D.   On: 01/28/2016 21:02   Dg Chest Port 1 View  01/29/2016  CLINICAL DATA:  Acute respiratory failure.  Intubated patient. EXAM: PORTABLE CHEST 1 VIEW COMPARISON:  01/28/2016 and 12/22/2015 FINDINGS: Endotracheal tube is 3.7 cm above the carina. Right jugular central line tip in the SVC region.  Nasogastric tube extends into the abdomen but the tip is beyond the image. There continues to be bibasilar chest densities compatible with pleural fluid and atelectasis/consolidation. Few patchy airspace densities may represent pulmonary edema. In addition, there is a rounded opacity along the lateral aspect of the left lung base. Limited evaluation of the cardiac silhouette due to the basilar chest densities. Negative for pneumothorax. Surgical plate in the lower cervical spine. IMPRESSION: Persistent bibasilar chest densities compatible with bilateral pleural effusions and consolidation. Bibasilar chest densities have not significantly changed. However, there is a rounded density at the left lung base. This may represent focal consolidation or loculated pleural fluid. Cannot exclude an underlying lesion in this area. Recommend continued follow-up and a consider CT evaluation if this area does not resolve. Mild pulmonary edema. Support apparatuses as described. Electronically Signed   By: Richarda Overlie M.D.   On: 01/29/2016 07:54     -Deep Hershel Corkery M.D.  Critical Care Attestation.  I have personally obtained a history, examined the patient, evaluated laboratory and imaging results, formulated the assessment and plan and placed orders. The Patient requires high complexity decision making for assessment and support, frequent evaluation and titration of therapies, application of advanced monitoring technologies and extensive interpretation of multiple databases. The patient has critical illness that could lead imminently to failure of 1 or more organ systems and requires the highest level of physician preparedness to intervene.  Critical Care Time devoted to patient care services described in this note is 40 minutes and is exclusive of time spent in procedures.

## 2016-01-31 ENCOUNTER — Inpatient Hospital Stay: Payer: Medicare Other

## 2016-01-31 DIAGNOSIS — S21109A Unspecified open wound of unspecified front wall of thorax without penetration into thoracic cavity, initial encounter: Secondary | ICD-10-CM | POA: Insufficient documentation

## 2016-01-31 DIAGNOSIS — Z9889 Other specified postprocedural states: Secondary | ICD-10-CM

## 2016-01-31 DIAGNOSIS — S21101A Unspecified open wound of right front wall of thorax without penetration into thoracic cavity, initial encounter: Secondary | ICD-10-CM

## 2016-01-31 DIAGNOSIS — L8990 Pressure ulcer of unspecified site, unspecified stage: Secondary | ICD-10-CM | POA: Insufficient documentation

## 2016-01-31 LAB — PROTIME-INR
INR: 1.15
Prothrombin Time: 14.9 seconds (ref 11.4–15.0)

## 2016-01-31 LAB — GLUCOSE, CAPILLARY
GLUCOSE-CAPILLARY: 124 mg/dL — AB (ref 65–99)
GLUCOSE-CAPILLARY: 128 mg/dL — AB (ref 65–99)
GLUCOSE-CAPILLARY: 145 mg/dL — AB (ref 65–99)
Glucose-Capillary: 78 mg/dL (ref 65–99)
Glucose-Capillary: 160 mg/dL — ABNORMAL HIGH (ref 65–99)

## 2016-01-31 LAB — BODY FLUID CELL COUNT WITH DIFFERENTIAL
EOS FL: 0 %
LYMPHS FL: 45 %
Monocyte-Macrophage-Serous Fluid: 36 %
NEUTROPHIL FLUID: 19 %
OTHER CELLS FL: 0 %
WBC FLUID: 165 uL

## 2016-01-31 LAB — PROTEIN, BODY FLUID: Total protein, fluid: <3 g/dL

## 2016-01-31 LAB — LACTATE DEHYDROGENASE, PLEURAL OR PERITONEAL FLUID: LD FL: 132 U/L — AB (ref 3–23)

## 2016-01-31 LAB — HEMOGLOBIN: Hemoglobin: 10.3 g/dL — ABNORMAL LOW (ref 13.0–18.0)

## 2016-01-31 NOTE — Progress Notes (Signed)
eLink Physician-Brief Progress Note Patient Name: Tanner Campbell DOB: 06-04-1976 MRN: [id number]   Date of Service  01/31/2016  HPI/Events of Note  Signed out to check hgb and pcxr post thora and skin bleeder / hemothorax concerns  eICU Interventions  hgb good wil order chest film      Intervention Category Intermediate Interventions: Communication with other healthcare providers and/or family  Nelda Bucks. 01/31/2016, 10:23 PM

## 2016-01-31 NOTE — Progress Notes (Signed)
eLink Physician-Brief Progress Note Patient Name: Tanner Campbell DOB: July 02, 1976 MRN: [id number]   Date of Service  01/31/2016  HPI/Events of Note  Uncontrollable diarrhea. Nurse request Flexiseal.   eICU Interventions  Will order: 1. Place Flexiseal.      Intervention Category Intermediate Interventions: Other:  Lenell Antu 01/31/2016, 5:39 AM

## 2016-01-31 NOTE — Progress Notes (Signed)
Dr. Everlene Farrier in ICU to assess patient's thoracentesis puncture site. RN called down to ultrasound and asked to speak to Dr. Chilton Si but MD went home at 1700.

## 2016-01-31 NOTE — Progress Notes (Signed)
[facility]  ROUNDING NOTE   Subjective:   Patient remains critically ill intubated and sedated Vent: fio2 30%,  tube feeds at 65 cc per hour  Objective:  Vital signs in last 24 hours:  Temp:  [97.7 F (36.5 C)-99.3 F (37.4 C)] 98.5 F (36.9 C) (06/28 0808) Pulse Rate:  [75-88] 87 (06/28 1000) Resp:  [10-24] 12 (06/28 1000) BP: (126-166)/(67-85) 149/73 mmHg (06/28 1000) SpO2:  [96 %-100 %] 100 % (06/28 1147) FiO2 (%):  [30 %] 30 % (06/28 1147) Weight:  [65 kg (143 lb 4.8 oz)-70.1 kg (154 lb 8.7 oz)] 65 kg (143 lb 4.8 oz) (06/28 0500)  Weight change: 5 kg (11 lb 0.4 oz) Filed Weights   01/30/16 0910 01/30/16 1300 01/31/16 0500  Weight: 71.2 kg (156 lb 15.5 oz) 70.1 kg (154 lb 8.7 oz) 65 kg (143 lb 4.8 oz)    Intake/Output: I/O last 3 completed shifts: In: 3400.4 [I.V.:328.4; NG/GT:3022; IV Piggyback:50] Out: 2000 [Other:2000]   Intake/Output this shift:  Total I/O In: 183.6 [I.V.:53.6; NG/GT:130] Out: -   Physical Exam: General: Critically ill  Head: +ETT  Eyes: Anicteric,    Neck: Supple, trachea midline  Lungs:  Vent assisted  Heart: Regular rate and rhythm  Abdomen:  Soft, nontender  Extremities: No peripheral edema.  Neurologic: sedated  Skin: No lesions  Access: Left arm AVF, good thrill    Basic Metabolic Panel:  Recent Labs Lab 01/25/16 0432  01/26/16 1100 01/26/16 1930 01/27/16 0445 01/27/16 1649 01/28/16 0404 01/29/16 0447 01/29/16 0745 01/30/16 0416  NA 138  --   --   --  141  --  140 139  --  136  K 5.7*  --   --   --  3.9  --  3.7 3.6  --  4.3  CL 99*  --   --   --  100*  --  97* 97*  --  97*  CO2 21*  --   --   --  30  --  31 34*  --  30  GLUCOSE 313*  --   --   --  88  --  211* 250*  --  126*  BUN 106*  --   --   --  43*  --  61* 47*  --  71*  CREATININE 9.11*  --   --   --  4.08*  --  5.11* 3.55*  --  4.78*  CALCIUM 8.5*  --   --   --  8.4*  --  8.5* 8.3*  --  8.1*  MG  --   --  2.5* 2.0 2.0 2.1  --   --  2.1  --    PHOS  --   < > 6.8* 3.7 3.9 4.0  --  1.6*  --  1.8*  < > = values in this interval not displayed.  Liver Function Tests:  Recent Labs Lab 01/29/16 0447  ALBUMIN 2.1*   No results for input(s): LIPASE, AMYLASE in the last 168 hours. No results for input(s): AMMONIA in the last 168 hours.  CBC:  Recent Labs Lab 01/25/16 0432 01/27/16 0445 01/28/16 0349 01/29/16 0745 01/30/16 0416  WBC 10.5 11.9* 14.1* 19.0* 18.1*  HGB 11.1* 10.7* 10.8* 10.9* 9.9*  HCT 33.8* 33.9* 34.2* 34.6* 30.7*  MCV 86.1 87.3 86.9 89.3 86.7  PLT 187 147* 128* 132* 133*    Cardiac Enzymes:  Recent Labs Lab 01/24/16 2125  TROPONINI 0.04*  BNP: Invalid input(s): POCBNP  CBG:  Recent Labs Lab 01/30/16 2338 01/31/16 0501 01/31/16 0746 01/31/16 0947 01/31/16 1133  GLUCAP 182* 78 124* 128* 145*    Microbiology: Results for orders placed or performed during the hospital encounter of 01/24/16  MRSA PCR Screening     Status: None   Collection Time: 01/25/16  2:22 AM  Result Value Ref Range Status   MRSA by PCR NEGATIVE NEGATIVE Final    Comment:        The GeneXpert MRSA Assay (FDA approved for NASAL specimens only), is one component of a comprehensive MRSA colonization surveillance program. It is not intended to diagnose MRSA infection nor to guide or monitor treatment for MRSA infections.   Culture, expectorated sputum-assessment     Status: None   Collection Time: 01/27/16  4:45 AM  Result Value Ref Range Status   Specimen Description TRACHEAL ASPIRATE  Final   Special Requests NONE  Final   Sputum evaluation THIS SPECIMEN IS ACCEPTABLE FOR SPUTUM CULTURE  Final   Report Status 01/27/2016 FINAL  Final  Culture, respiratory (NON-Expectorated)     Status: None   Collection Time: 01/27/16  4:45 AM  Result Value Ref Range Status   Specimen Description TRACHEAL ASPIRATE  Final   Special Requests NONE Reflexed from [phone]  Final   Gram Stain   Final    ABUNDANT WBC PRESENT,  PREDOMINANTLY PMN NO SQUAMOUS EPITHELIAL CELLS SEEN ABUNDANT GRAM NEGATIVE COCCOBACILLI FEW GRAM POSITIVE COCCI IN PAIRS RARE GRAM POSITIVE RODS    Culture   Final    ABUNDANT HAEMOPHILUS INFLUENZAE BETA LACTAMASE NEGATIVE Performed at [facility]    Report Status 01/29/2016 FINAL  Final  Culture, blood (Routine X 2) w Reflex to ID Panel     Status: None (Preliminary result)   Collection Time: 01/27/16  5:18 AM  Result Value Ref Range Status   Specimen Description BLOOD RIGHT ARM  Final   Special Requests BOTTLES DRAWN AEROBIC AND ANAEROBIC 5CC  Final   Culture NO GROWTH 4 DAYS  Final   Report Status PENDING  Incomplete  Culture, blood (Routine X 2) w Reflex to ID Panel     Status: None (Preliminary result)   Collection Time: 01/27/16  5:23 AM  Result Value Ref Range Status   Specimen Description BLOOD RIGHT HAND  Final   Special Requests BOTTLES DRAWN AEROBIC AND ANAEROBIC 5CC  Final   Culture NO GROWTH 4 DAYS  Final   Report Status PENDING  Incomplete    Coagulation Studies: No results for input(s): LABPROT, INR in the last 72 hours.  Urinalysis: No results for input(s): COLORURINE, LABSPEC, PHURINE, GLUCOSEU, HGBUR, BILIRUBINUR, KETONESUR, PROTEINUR, UROBILINOGEN, NITRITE, LEUKOCYTESUR in the last 72 hours.  Invalid input(s): APPERANCEUR    Imaging: Dg Chest 1 View  01/30/2016  CLINICAL DATA:  Dyspnea EXAM: CHEST 1 VIEW COMPARISON:  Yesterday FINDINGS: Endotracheal tube tip between the clavicular heads and carina. Right IJ line with tip at the upper cavoatrial junction. An orogastric tube reaches stomach that is partially gas distended. Bilateral pleural effusion, moderate volume. Chronic cardiomegaly. Bibasilar lung opacity. No edema seen in the apical lungs. Negative for pneumothorax. IMPRESSION: 1. Stable, unremarkable tube and line positioning. 2. Bilateral moderate pleural effusions with basilar atelectasis or pneumonia. Electronically Signed   By: Monte Fantasia M.D.   On: 01/30/2016 07:32   Dg Abd Portable 1v  01/30/2016  CLINICAL DATA:  Nasogastric tube placement. EXAM: PORTABLE ABDOMEN - 1 VIEW COMPARISON:  01/28/2016 FINDINGS: Nasogastric tube is in place, tip overlying the level of the stomach. There is marked gaseous distension of stomach. Bowel gas pattern is otherwise nonobstructive. Surgical clips are noted in the right upper quadrant and the pelvis. Visualized osseous structures have a normal appearance. IMPRESSION: 1. Nasogastric tube placement to the stomach. 2. Marked gaseous distension of the stomach. Electronically Signed   By: Nolon Nations M.D.   On: 01/30/2016 15:10     Medications:   . propofol (DIPRIVAN) infusion 40 mcg/kg/min (01/31/16 1100)   . antiseptic oral rinse  7 mL Mouth Rinse 10 times per day  . atorvastatin  40 mg Oral QPM  . budesonide (PULMICORT) nebulizer solution  0.5 mg Nebulization BID  . cefTRIAXone (ROCEPHIN)  IV  1 g Intravenous Q24H  . chlorhexidine gluconate (SAGE KIT)  15 mL Mouth Rinse BID  . DULoxetine  30 mg Oral Daily  . epoetin (EPOGEN/PROCRIT) injection  4,000 Units Intravenous Q T,Th,Sa-HD  . feeding supplement (VITAL HIGH PROTEIN)  1,000 mL Per Tube Q24H  . heparin  5,000 Units Subcutaneous Q8H  . hydrocortisone   Topical BID  . insulin aspart  0-20 Units Subcutaneous Q4H  . insulin detemir  10 Units Subcutaneous Daily  . ipratropium-albuterol  3 mL Nebulization Q4H  . multivitamin  1 tablet Oral Daily  . pantoprazole sodium  40 mg Per Tube QAC breakfast  . polyethylene glycol  17 g Oral BID  . sodium chloride flush  3 mL Intravenous Q12H   sodium chloride, acetaminophen **OR** acetaminophen, albuterol, docusate, fentaNYL, fentaNYL (SUBLIMAZE) injection, fentaNYL (SUBLIMAZE) injection, midazolam, ondansetron **OR** ondansetron (ZOFRAN) IV, senna-docusate  Assessment/ Plan:  [name] is a 40 y.o. white male with End-stage renal disease with hemodialysis Started hemodialysis  May 02, 2014, Hypertension, Diabetes mellitus type 2, hyperlipidemia, Multiple sclerosis, COPD, Back surgery, Cholecystectomy, Tobacco use   TTS [facility] [employer]. left arm AVF  1. End Stage Renal Disease: with pulmonary edema and hyperkalemia.  - non compliance with outpatient dialysis TTS Missed last three outpatient treatments. Now with respiratory failure requiring mechanical ventilation.  - emergent hemodialysis for Thursday 6/22 and Friday 6/23 and Sunday - 2 L of fluid remved at dialysis yesterday - Next treatment planned for Thursday  2. Acute resp failure - multifactorial - pulm edema, possible underlying pneumonia  3. Anemia of chronic kidney disease: hemoglobin at goal - low dose EPO with HD     LOS: 6 Ebin Palazzi 6/28/201712:11 PM

## 2016-01-31 NOTE — Progress Notes (Signed)
[facility] Critical Care Medicine Progess Note  Name: Tanner Campbell MRN: [id number] DOB: 1975/09/07     ASSESSMENT/PLAN    DISCUSSION: 40 year old male presenting with acute on chronic respiratory failure secondary to pulmonary edema,  COPD exacerbation, hypokalemia, and end-stage renal disease on hemodialysis.  ASSESSMENT / PLAN:  PULMONARY A: Acute hypercarbic and hypoxic respiratory failure secondary to volume overload. Pulmonary edema., Chest x-ray reviewed today. Continued Atelectasis and effusion, when compared with previous. Acute COPD exacerbation -Vent settings reviewed, PRVC/15/500/30% -Haemophilus pneumonia.  P:   -Weaning trial today we will extubate if tolerated. -Daily chest x-ray. -ABG when necessary -VAP Protocol  -Daily weaning trials as tolerated -Continue antibiotics for Haemophilus.  CARDIOVASCULAR A:  History of hypertension-BP is stable P:  -Hemodynamic monitoring per ICU protocol   RENAL A:   End-stage renal disease on dialysis Hyperkalemia P:   -Nephrology following, hemodialysis per nephrology -Monitor and correct electrolyte imbalance  GASTROINTESTINAL A:   Constipation-mild ileus. P:   -PPI for stress ulcer prophylaxis -Aspiration precautions -Tube feeds as tolerated -Miralax to continue, rectal tube in place. -We'll check C. Difficile if continues.  HEMATOLOGIC A:   Anemia of chronic disease-hemoglobin and hematocrit stable Thrombocytopenia-improved Leukocytosis, stable  P:  -Trend CBC  INFECTIOUS A:   Status post Fever , continued leukocytosis- sputum culture with abundant GPB and GPC in pairs P:   -Continue treatment of Haemophilus. -f/u final cultures, will de-escalate antibiotics.  CULTURES: MRSA screen 6/22: Blood cultures 2 negative Sputum culture: 6/24: Haemophilus influenza  ANTIBIOTICS: cefepime 6/25 >> 6/27 Ceftriaxone 6/26>>  LINES/TUBES: CVC Triple Lumen 01/26/16 Right Internal jugular ET  tube placed, 01/26/2016 OG tube placed 01/26/2016   ENDOCRINE A:   Type 2 diabetes mellitus, glucose, appears adequately controlled.   P:   -Point-of-care glucose testing with Sliding-scale insulin coverage  NEUROLOGIC A:   Acute hypoxic/hypercarbic encephalopathy P:   RASS goal: -1 to -2 -Treated acidosis and hypercarbia. -Continue vent support -Propofol for vent sedation and comfort  Disposition and Family update:   STUDIES:  None   SIGNIFICANT EVENTS: 01/24/2016: ED with pulmonary edema, failed BiPAP, intubated  Best Practice: Code Status: Full code Diet: Nothing by mouth GI prophylaxis: PPI VTE prophylaxis:  SCD's /heparin  ------------------------------  ADMISSION DATE:  01/24/2016   CONSULTATION DATE:  01/26/2016  REFERRING MD :  Dr Carole Binning   Interim history. Remains intubated and sedated. No new complaints today.  VITAL SIGNS: Temp:  [97.7 F (36.5 C)-99.3 F (37.4 C)] 98.8 F (37.1 C) (06/28 0400) Pulse Rate:  [75-91] 85 (06/28 0700) Resp:  [10-24] 10 (06/28 0700) BP: (126-166)/(67-85) 151/74 mmHg (06/28 0700) SpO2:  [96 %-100 %] 100 % (06/28 0808) FiO2 (%):  [30 %] 30 % (06/28 0808) Weight:  [143 lb 4.8 oz (65 kg)-156 lb 15.5 oz (71.2 kg)] 143 lb 4.8 oz (65 kg) (06/28 0500)  PHYSICAL EXAMINATION: General: Sedated Neuro: Awake, follows some commands, moves all extremities to noxious stimulus HEENT: PERRLA, oral mucosa moist and pink Cardiovascular: RRR, S1/S2, no MRG Lungs: Bilateral airflow, diffuse rhonchi in upper lung fields Abdomen: Mildly distended, hypoactive bowel sounds, palpation reveals no organomegaly Musculoskeletal:  No visible joint deformities. Extremities: +2 pulses, no edema Skin: Skin is warm and dry without any open lesions   Recent Labs Lab 01/28/16 0404 01/29/16 0447 01/30/16 0416  NA 140 139 136  K 3.7 3.6 4.3  CL 97* 97* 97*  CO2 31 34* 30  BUN 61* 47* 71*  CREATININE 5.11* 3.55* 4.78*  GLUCOSE  211* 250*  126*    Recent Labs Lab 01/28/16 0349 01/29/16 0745 01/30/16 0416  HGB 10.8* 10.9* 9.9*  HCT 34.2* 34.6* 30.7*  WBC 14.1* 19.0* 18.1*  PLT 128* 132* 133*   Dg Chest 1 View  01/30/2016  CLINICAL DATA:  Dyspnea EXAM: CHEST 1 VIEW COMPARISON:  Yesterday FINDINGS: Endotracheal tube tip between the clavicular heads and carina. Right IJ line with tip at the upper cavoatrial junction. An orogastric tube reaches stomach that is partially gas distended. Bilateral pleural effusion, moderate volume. Chronic cardiomegaly. Bibasilar lung opacity. No edema seen in the apical lungs. Negative for pneumothorax. IMPRESSION: 1. Stable, unremarkable tube and line positioning. 2. Bilateral moderate pleural effusions with basilar atelectasis or pneumonia. Electronically Signed   By: Marnee Spring M.D.   On: 01/30/2016 07:32   Dg Abd Portable 1v  01/30/2016  CLINICAL DATA:  Nasogastric tube placement. EXAM: PORTABLE ABDOMEN - 1 VIEW COMPARISON:  01/28/2016 FINDINGS: Nasogastric tube is in place, tip overlying the level of the stomach. There is marked gaseous distension of stomach. Bowel gas pattern is otherwise nonobstructive. Surgical clips are noted in the right upper quadrant and the pelvis. Visualized osseous structures have a normal appearance. IMPRESSION: 1. Nasogastric tube placement to the stomach. 2. Marked gaseous distension of the stomach. Electronically Signed   By: Norva Pavlov M.D.   On: 01/30/2016 15:10     -Deep Jamice Carreno M.D.  Critical Care Attestation.  I have personally obtained a history, examined the patient, evaluated laboratory and imaging results, formulated the assessment and plan and placed orders. The Patient requires high complexity decision making for assessment and support, frequent evaluation and titration of therapies, application of advanced monitoring technologies and extensive interpretation of multiple databases. The patient has critical illness that could lead  imminently to failure of 1 or more organ systems and requires the highest level of physician preparedness to intervene.  Critical Care Time devoted to patient care services described in this note is 40 minutes and is exclusive of time spent in procedures.

## 2016-01-31 NOTE — Progress Notes (Signed)
Nutrition Follow-up  DOCUMENTATION CODES:   Not applicable  INTERVENTION:  -TF: possible extubation today, recommend continuing TF if unable to extubate. If remains on vent and diprivan, will need to reassess appropriate goal rate. Continue to assess. Bowel regimen adjusted per MD due to liquid stool   NUTRITION DIAGNOSIS:   Inadequate oral intake related to acute illness as evidenced by NPO status.  Being addressed via TF  GOAL:   Provide needs based on ASPEN/SCCM guidelines  MONITOR:   TF tolerance, Vent status, Labs, Weight trends  REASON FOR ASSESSMENT:   Consult Enteral/tube feeding initiation and management  ASSESSMENT:    40 yo male admitted with acute respiratory failure with volume overload and hyperkalemia; pt with hx of ESRD on HD and missed last 3 outpatient dialysis treatment  Pt remains on vent, weaning trial again today with possible extubation, pt remains on diprivan for sedation  Tolerating Vital High Protein at rate of 65 ml/hr, on PEPuP protocol; abdomen soft, distended, BS active, +loose stool now with rectal tube in place. Possible mild ileus per MD note but no vomiting noted  Diet Order:  Diet NPO time specified  Skin:   (deep tissue injury to sacrum)  Last BM:  6/28 liquid stool with rectal tube in place after receiving bowel regimen for constipation   Labs: no new BMP today, reviewed  Meds: diprivan, ss novolog, levemir, miralax, lactulose, colace  Height:   Ht Readings from Last 1 Encounters:  01/26/16 5\' 5"  (1.651 m)    Weight:   Wt Readings from Last 1 Encounters:  01/31/16 143 lb 4.8 oz (65 kg)   BMI:  Body mass index is 23.85 kg/(m^2).  Estimated Nutritional Needs:   Kcal:  1646 kcals using wt of 73.2 kg  Protein:  110-146 g  Fluid:  1000 mL plus UOP  EDUCATION NEEDS:   No education needs identified at this time  Romelle Starcher MS, RD, LDN [phone] Pager  [phone] Weekend/On-Call Pager

## 2016-01-31 NOTE — Progress Notes (Signed)
Asked to see pt by ICU team. Briefly 40-year-old male with end-stage renal disease on hemodialysis and noncompliant came in with respiratory failure and pulmonary edema. Multiple comorbidities including diabetes COPD, hypertension. He underwent ultrasound-guided thoracentesis today and shortly after arriving to the ICU starting developing some bleeding from the puncture site on his right chest. Chest x-ray personal review there is no evidence of hemo-or pneumothorax. On exam there is a continuous oozing from the skin bleeder. There is a saturated dressing with bright blood. Using ChloraPrep and a sterile technique we placed a figure-of-eight 2-0 silk suture to control the bleeding from the chest wall. A sterile dressing was applied. No complications. Patient tolerated procedure well and there was no more bleeding. Also his hemoglobin is actually improving and no evidence of active bleeding. May remove a stitch in 4 days.

## 2016-01-31 NOTE — Progress Notes (Signed)
RN entered room and patient had vomited on his gown and bed.  Patient not following commands and not opening his eyes, only moving mouth and gagging against ET tube.  RR 31 with increased work of breathing.  Dr. Nicholos Johns came to room to assess patient and gave order to place patient back on full vent support and to resume sedation.

## 2016-01-31 NOTE — OR Nursing (Signed)
ccu nurse informed to hold 1400 heparin until after thoracentesis completed

## 2016-01-31 NOTE — Progress Notes (Signed)
RN and NT came into room to turn patient and check for stool leakage and upon assessment bed pad and gown were wet from bleeding from thoracentesis puncture site.  Pressure dressing applied 3 different times while cleaning patient up.  RN made Dr. Dema Severin and Lexine Baton, NP aware of bleeding from site. MD gave order for hgb.

## 2016-01-31 NOTE — Progress Notes (Addendum)
Dr. Dema Severin and Lexine Baton, NP at bedside and assessed puncture site.  Dressing changed again due to break through bleeding. MD speaking with Dr. Thelma Barge at this time. Patient also had emesis again and Dr. Dema Severin gave order to hold tube feeds until the morning and reassess.

## 2016-01-31 NOTE — Progress Notes (Signed)
Update; Patient s/p thoracentesis, now with slow but moderate amount of bleeding from thoracentesis site.  Site evaluated: small, pulsatile flow of blood from site. Spoke with Dr. Thelma Barge about case via telephone, recommended a stitch to the area, informed Surgery of the case (Dr. Tonita Cong), one of their team members will see patient.  Plan: Cont with pressure dressing Check PT/INR/Hb and CXR  Surgery to evaluate for possible stitch   Stephanie Acre, MD  Pulmonary and Critical Care Pager [phone] (please enter 7-digits) On Call Pager - [phone] (please enter 7-digits) Clinic - [phone]

## 2016-01-31 NOTE — Progress Notes (Signed)
Stitches placed by Dr. Everlene Farrier at bedside.  Site is now clean and dry.  Dressing remains clean, dry, and intact.    Tube feeds on hold at start of shift due to patient vomiting.  Pt currently gagging, abdomen distended.  Fexiseal remains in place for loose diarrhea.  OGT placed to LIS per Bin[place], NP.

## 2016-02-01 ENCOUNTER — Inpatient Hospital Stay
Admission: AD | Admit: 2016-02-01 | Discharge: 2016-02-16 | Disposition: A | Discharge status: Other Acute Inpatient Hospital | Payer: Medicare Other | Source: Ambulatory Visit | Attending: Internal Medicine | Admitting: Internal Medicine

## 2016-02-01 ENCOUNTER — Inpatient Hospital Stay: Admission: RE | Admit: 2016-02-01 | Payer: Medicare Other | Source: Ambulatory Visit | Admitting: Internal Medicine

## 2016-02-01 ENCOUNTER — Inpatient Hospital Stay: Payer: Medicare Other

## 2016-02-01 ENCOUNTER — Ambulatory Visit (HOSPITAL_COMMUNITY)
Admission: AD | Admit: 2016-02-01 | Discharge: 2016-02-01 | Disposition: A | Discharge status: Home / Self Care | Payer: Medicare Other | Source: Other Acute Inpatient Hospital | Attending: Internal Medicine | Admitting: Internal Medicine

## 2016-02-01 DIAGNOSIS — J90 Pleural effusion, not elsewhere classified: Secondary | ICD-10-CM | POA: Insufficient documentation

## 2016-02-01 DIAGNOSIS — I10 Essential (primary) hypertension: Secondary | ICD-10-CM | POA: Diagnosis present

## 2016-02-01 DIAGNOSIS — N186 End stage renal disease: Secondary | ICD-10-CM

## 2016-02-01 DIAGNOSIS — A419 Sepsis, unspecified organism: Secondary | ICD-10-CM | POA: Diagnosis present

## 2016-02-01 DIAGNOSIS — J189 Pneumonia, unspecified organism: Secondary | ICD-10-CM | POA: Insufficient documentation

## 2016-02-01 DIAGNOSIS — Z452 Encounter for adjustment and management of vascular access device: Secondary | ICD-10-CM

## 2016-02-01 DIAGNOSIS — Z01818 Encounter for other preprocedural examination: Secondary | ICD-10-CM

## 2016-02-01 DIAGNOSIS — Z992 Dependence on renal dialysis: Secondary | ICD-10-CM

## 2016-02-01 DIAGNOSIS — Z0189 Encounter for other specified special examinations: Secondary | ICD-10-CM

## 2016-02-01 DIAGNOSIS — J449 Chronic obstructive pulmonary disease, unspecified: Secondary | ICD-10-CM | POA: Diagnosis present

## 2016-02-01 DIAGNOSIS — Z4659 Encounter for fitting and adjustment of other gastrointestinal appliance and device: Secondary | ICD-10-CM

## 2016-02-01 DIAGNOSIS — I509 Heart failure, unspecified: Secondary | ICD-10-CM

## 2016-02-01 DIAGNOSIS — J9690 Respiratory failure, unspecified, unspecified whether with hypoxia or hypercapnia: Secondary | ICD-10-CM

## 2016-02-01 DIAGNOSIS — Z9911 Dependence on respirator [ventilator] status: Secondary | ICD-10-CM | POA: Insufficient documentation

## 2016-02-01 DIAGNOSIS — I82C19 Acute embolism and thrombosis of unspecified internal jugular vein: Secondary | ICD-10-CM

## 2016-02-01 DIAGNOSIS — R778 Other specified abnormalities of plasma proteins: Secondary | ICD-10-CM | POA: Diagnosis present

## 2016-02-01 DIAGNOSIS — N179 Acute kidney failure, unspecified: Secondary | ICD-10-CM | POA: Diagnosis present

## 2016-02-01 DIAGNOSIS — R7989 Other specified abnormal findings of blood chemistry: Secondary | ICD-10-CM

## 2016-02-01 DIAGNOSIS — J9620 Acute and chronic respiratory failure, unspecified whether with hypoxia or hypercapnia: Secondary | ICD-10-CM | POA: Diagnosis present

## 2016-02-01 DIAGNOSIS — N189 Chronic kidney disease, unspecified: Secondary | ICD-10-CM

## 2016-02-01 LAB — CULTURE, BLOOD (ROUTINE X 2)
CULTURE: NO GROWTH
Culture: NO GROWTH

## 2016-02-01 LAB — CBC WITH DIFFERENTIAL/PLATELET
Basophils Absolute: 0.1 10*3/uL (ref 0–0.1)
EOS ABS: 0.1 10*3/uL (ref 0–0.7)
HCT: 32.5 % — ABNORMAL LOW (ref 40.0–52.0)
HEMOGLOBIN: 10.4 g/dL — AB (ref 13.0–18.0)
LYMPHS ABS: 0.4 10*3/uL — AB (ref 1.0–3.6)
Lymphocytes Relative: 2 %
MCH: 27.4 pg (ref 26.0–34.0)
MCHC: 31.9 g/dL — ABNORMAL LOW (ref 32.0–36.0)
MCV: 85.6 fL (ref 80.0–100.0)
Monocytes Absolute: 1.6 10*3/uL — ABNORMAL HIGH (ref 0.2–1.0)
Neutro Abs: 15.8 10*3/uL — ABNORMAL HIGH (ref 1.4–6.5)
Neutrophils Relative %: 88 %
Platelets: 168 10*3/uL (ref 150–440)
RBC: 3.79 MIL/uL — ABNORMAL LOW (ref 4.40–5.90)
RDW: 16.8 % — AB (ref 11.5–14.5)
WBC: 18 10*3/uL — ABNORMAL HIGH (ref 3.8–10.6)

## 2016-02-01 LAB — BLOOD GAS, ARTERIAL
ACID-BASE EXCESS: 7.7 mmol/L — AB (ref 0.0–2.0)
Bicarbonate: 31.6 mEq/L — ABNORMAL HIGH (ref 20.0–24.0)
FIO2: 0.3
MECHVT: 500 mL
O2 SAT: 97.7 %
PEEP/CPAP: 5 cmH2O
PH ART: 7.482 — AB (ref 7.350–7.450)
PO2 ART: 95.2 mmHg (ref 80.0–100.0)
Patient temperature: 97.9
RATE: 15 resp/min
TCO2: 32.9 mmol/L (ref 0–100)
pCO2 arterial: 42.5 mmHg (ref 35.0–45.0)

## 2016-02-01 LAB — BASIC METABOLIC PANEL
ANION GAP: 13 (ref 5–15)
BUN: 67 mg/dL — ABNORMAL HIGH (ref 6–20)
CHLORIDE: 96 mmol/L — AB (ref 101–111)
CO2: 28 mmol/L (ref 22–32)
Calcium: 8.4 mg/dL — ABNORMAL LOW (ref 8.9–10.3)
Creatinine, Ser: 3.78 mg/dL — ABNORMAL HIGH (ref 0.61–1.24)
GFR calc Af Amer: 21 mL/min — ABNORMAL LOW (ref >60)
GFR calc non Af Amer: 18 mL/min — ABNORMAL LOW (ref >60)
GLUCOSE: 122 mg/dL — AB (ref 65–99)
POTASSIUM: 5.7 mmol/L — AB (ref 3.5–5.1)
Sodium: 137 mmol/L (ref 135–145)

## 2016-02-01 LAB — GLUCOSE, CAPILLARY
GLUCOSE-CAPILLARY: 123 mg/dL — AB (ref 65–99)
GLUCOSE-CAPILLARY: 91 mg/dL (ref 65–99)
GLUCOSE-CAPILLARY: 73 mg/dL (ref 65–99)
GLUCOSE-CAPILLARY: 175 mg/dL — AB (ref 65–99)
GLUCOSE-CAPILLARY: 63 mg/dL — AB (ref 65–99)
Glucose-Capillary: 109 mg/dL — ABNORMAL HIGH (ref 65–99)
Glucose-Capillary: 56 mg/dL — ABNORMAL LOW (ref 65–99)
Glucose-Capillary: 59 mg/dL — ABNORMAL LOW (ref 65–99)
Glucose-Capillary: 90 mg/dL (ref 65–99)
Glucose-Capillary: 98 mg/dL (ref 65–99)

## 2016-02-01 LAB — MISC LABCORP TEST (SEND OUT): LABCORP TEST CODE: 19588

## 2016-02-01 MED ORDER — DEXTROSE 50 % IV SOLN
25.0000 mL | Freq: Once | INTRAVENOUS | Status: AC
  Administered 2016-02-01: 25 mL via INTRAVENOUS
  Filled 2016-02-01: qty 50

## 2016-02-01 MED ORDER — HYDRALAZINE HCL 20 MG/ML IJ SOLN
10.0000 mg | INTRAMUSCULAR | Status: DC | PRN
  Administered 2016-02-01: 20 mg via INTRAVENOUS
  Filled 2016-02-01: qty 1

## 2016-02-01 MED ORDER — DEXTROSE 50 % IV SOLN
INTRAVENOUS | Status: AC
  Filled 2016-02-01: qty 50

## 2016-02-01 MED ORDER — IRBESARTAN 75 MG PO TABS
75.0000 mg | ORAL_TABLET | Freq: Every evening | ORAL | Status: DC
  Administered 2016-02-01: 75 mg via ORAL
  Filled 2016-02-01: qty 1

## 2016-02-01 MED ORDER — DEXTROSE 50 % IV SOLN
1.0000 | INTRAVENOUS | Status: AC
  Administered 2016-02-01: 50 mL via INTRAVENOUS

## 2016-02-01 MED ORDER — DEXTROSE 5 % IV SOLN
INTRAVENOUS | Status: DC
  Administered 2016-02-01: 17:00:00 via INTRAVENOUS

## 2016-02-01 MED ORDER — ENALAPRILAT 1.25 MG/ML IV SOLN: 0.6250 mg | Freq: Four times a day (QID) | INTRAVENOUS | Status: DC

## 2016-02-01 MED ORDER — DEXTROSE 10 % IV SOLN
INTRAVENOUS | Status: DC
  Administered 2016-02-01: 19:00:00 via INTRAVENOUS

## 2016-02-01 NOTE — Procedures (Addendum)
Chest ultrasound:  Chest ultrasound was performed on the posterior thoracic wall bilaterally in the lateral scapular line. On the right side. There appeared to be a moderate to large pleural effusion which appeared to be non--Loculated. This extended approximately two thirds way up the posterior chest wall. -The left side. There appeared to be atelectatic lung. Minimal pleural effusion area.  Conclusion/recommendations: Will send patient for right-sided diagnostic and therapeutic thoracentesis.

## 2016-02-01 NOTE — Progress Notes (Signed)
Pt CBG 59, then 56 upon recheck.  Per protocol, pt to receive 67ml D50.  Notified Dr. Nicholos Johns, per Dr. Nicholos Johns order for D5 at 30 ml/hr.

## 2016-02-01 NOTE — Progress Notes (Signed)
PRE HD assessment 

## 2016-02-01 NOTE — Progress Notes (Signed)
Recheck CBG 73 after starting D5 drip (trending down from 91 previous).  Per Dr. Nicholos Johns start D10 at 30 ml/hr, discontinue D5.

## 2016-02-01 NOTE — Progress Notes (Signed)
[facility]  ROUNDING NOTE   Subjective:   Patient remains critically ill intubated and sedated Vent: fio2 30%,  tube feeds off due to vomiting. 1 liter of aspirate obtained last night   Objective:  Vital signs in last 24 hours:  Temp:  [98.3 F (36.8 C)-99.2 F (37.3 C)] 98.3 F (36.8 C) (06/29 0800) Pulse Rate:  [82-96] 87 (06/29 0812) Resp:  [12-48] 19 (06/29 0812) BP: (110-172)/(67-87) 135/70 mmHg (06/29 0800) SpO2:  [97 %-100 %] 100 % (06/29 0813) FiO2 (%):  [30 %] 30 % (06/29 0813) Weight:  [64.1 kg (141 lb 5 oz)] 64.1 kg (141 lb 5 oz) (06/29 0500)  Weight change: -7.1 kg (-15 lb 10.4 oz) Filed Weights   01/30/16 1300 01/31/16 0500 02/01/16 0500  Weight: 70.1 kg (154 lb 8.7 oz) 65 kg (143 lb 4.8 oz) 64.1 kg (141 lb 5 oz)    Intake/Output: I/O last 3 completed shifts: In: 1988.5 [I.V.:548.5; NG/GT:1390; IV Piggyback:50] Out: 2330 [Emesis/NG output:900; Other:1100; Stool:300; Blood:30]   Intake/Output this shift:     Physical Exam: General: Critically ill  Head: +ETT  Eyes: Anicteric,    Neck: Supple, trachea midline  Lungs:  Vent assisted  Heart: Regular rate and rhythm  Abdomen:  Soft, nontender  Extremities: No peripheral edema.  Neurologic: sedated  Skin: No lesions  Access: Left arm AVF, good thrill    Basic Metabolic Panel:  Recent Labs Lab 01/26/16 1100 01/26/16 1930  01/27/16 0445 01/27/16 1649 01/28/16 0404 01/29/16 0447 01/29/16 0745 01/30/16 0416 02/01/16 0525  NA  --   --   --  141  --  140 139  --  136 137  K  --   --   --  3.9  --  3.7 3.6  --  4.3 5.7*  CL  --   --   --  100*  --  97* 97*  --  97* 96*  CO2  --   --   --  30  --  31 34*  --  30 28  GLUCOSE  --   --   --  88  --  211* 250*  --  126* 122*  BUN  --   --   --  43*  --  61* 47*  --  71* 67*  CREATININE  --   --   --  4.08*  --  5.11* 3.55*  --  4.78* 3.78*  CALCIUM  --   --   < > 8.4*  --  8.5* 8.3*  --  8.1* 8.4*  MG 2.5* 2.0  --  2.0 2.1  --   --   2.1  --   --   PHOS 6.8* 3.7  --  3.9 4.0  --  1.6*  --  1.8*  --   < > = values in this interval not displayed.  Liver Function Tests:  Recent Labs Lab 01/29/16 0447  ALBUMIN 2.1*   No results for input(s): LIPASE, AMYLASE in the last 168 hours. No results for input(s): AMMONIA in the last 168 hours.  CBC:  Recent Labs Lab 01/27/16 0445 01/28/16 0349 01/29/16 0745 01/30/16 0416 01/31/16 1835 02/01/16 0525  WBC 11.9* 14.1* 19.0* 18.1*  --  18.0*  NEUTROABS  --   --   --   --   --  15.8*  HGB 10.7* 10.8* 10.9* 9.9* 10.3* 10.4*  HCT 33.9* 34.2* 34.6* 30.7*  --  32.5*  MCV 87.3 86.9 89.3  86.7  --  85.6  PLT 147* 128* 132* 133*  --  168    Cardiac Enzymes: No results for input(s): CKTOTAL, CKMB, CKMBINDEX, TROPONINI in the last 168 hours.  BNP: Invalid input(s): POCBNP  CBG:  Recent Labs Lab 01/31/16 1133 01/31/16 2017 02/01/16 0050 02/01/16 0404 02/01/16 0727  GLUCAP 145* 160* 1 109* 70*    Microbiology: Results for orders placed or performed during the hospital encounter of 01/24/16  MRSA PCR Screening     Status: None   Collection Time: 01/25/16  2:22 AM  Result Value Ref Range Status   MRSA by PCR NEGATIVE NEGATIVE Final    Comment:        The GeneXpert MRSA Assay (FDA approved for NASAL specimens only), is one component of a comprehensive MRSA colonization surveillance program. It is not intended to diagnose MRSA infection nor to guide or monitor treatment for MRSA infections.   Culture, expectorated sputum-assessment     Status: None   Collection Time: 01/27/16  4:45 AM  Result Value Ref Range Status   Specimen Description TRACHEAL ASPIRATE  Final   Special Requests NONE  Final   Sputum evaluation THIS SPECIMEN IS ACCEPTABLE FOR SPUTUM CULTURE  Final   Report Status 01/27/2016 FINAL  Final  Culture, respiratory (NON-Expectorated)     Status: None   Collection Time: 01/27/16  4:45 AM  Result Value Ref Range Status   Specimen Description  TRACHEAL ASPIRATE  Final   Special Requests NONE Reflexed from [phone]  Final   Gram Stain   Final    ABUNDANT WBC PRESENT, PREDOMINANTLY PMN NO SQUAMOUS EPITHELIAL CELLS SEEN ABUNDANT GRAM NEGATIVE COCCOBACILLI FEW GRAM POSITIVE COCCI IN PAIRS RARE GRAM POSITIVE RODS    Culture   Final    ABUNDANT HAEMOPHILUS INFLUENZAE BETA LACTAMASE NEGATIVE Performed at [facility]    Report Status 01/29/2016 FINAL  Final  Culture, blood (Routine X 2) w Reflex to ID Panel     Status: None   Collection Time: 01/27/16  5:18 AM  Result Value Ref Range Status   Specimen Description BLOOD RIGHT ARM  Final   Special Requests BOTTLES DRAWN AEROBIC AND ANAEROBIC 5CC  Final   Culture NO GROWTH 5 DAYS  Final   Report Status 02/01/2016 FINAL  Final  Culture, blood (Routine X 2) w Reflex to ID Panel     Status: None   Collection Time: 01/27/16  5:23 AM  Result Value Ref Range Status   Specimen Description BLOOD RIGHT HAND  Final   Special Requests BOTTLES DRAWN AEROBIC AND ANAEROBIC 5CC  Final   Culture NO GROWTH 5 DAYS  Final   Report Status 02/01/2016 FINAL  Final  Body fluid culture     Status: None (Preliminary result)   Collection Time: 01/31/16  3:45 PM  Result Value Ref Range Status   Specimen Description PLEURAL  Final   Special Requests NONE  Final   Gram Stain   Final    FEW WBC PRESENT,BOTH PMN AND MONONUCLEAR NO ORGANISMS SEEN Performed at [facility]    Culture PENDING  Incomplete   Report Status PENDING  Incomplete    Coagulation Studies:  Recent Labs  01/31/16 2019  LABPROT 14.9  INR 1.15    Urinalysis: No results for input(s): COLORURINE, LABSPEC, PHURINE, GLUCOSEU, HGBUR, BILIRUBINUR, KETONESUR, PROTEINUR, UROBILINOGEN, NITRITE, LEUKOCYTESUR in the last 72 hours.  Invalid input(s): APPERANCEUR    Imaging: Dg Chest Port 1 View  02/01/2016  CLINICAL DATA:  Acute on chronic respiratory failure ; status post thoracentesis EXAM: PORTABLE CHEST 1 VIEW  COMPARISON:  Portable chest x-ray of January 31, 2016 FINDINGS: Volume of pleural fluid on the left appears decreased. There is persistent alveolar opacity in the lower left lung consistent with pneumonia. On the right a small pleural effusion persists. There is persistent alveolar opacity inferior laterally. The heart is top-normal in size. The pulmonary vascularity is not engorged. The endotracheal tube tip lies 5.3 cm above the carina. The esophagogastric tube tip projects below the inferior margin of the image. The right internal jugular venous catheter tip projects over the midportion of the SVC. IMPRESSION: Slight decrease in pleural fluid volume on the left presumably due to the reported thoracentesis. Persistent bibasilar atelectasis or pneumonia. Persistent small right pleural effusion. The support tubes are in stable position. Electronically Signed   By: David  Martinique M.D.   On: 02/01/2016 07:34   Dg Chest Port 1 View  01/31/2016  CLINICAL DATA:  Respiratory failure. EXAM: PORTABLE CHEST 1 VIEW COMPARISON:  01/31/2016 at 15:57 FINDINGS: The endotracheal tube is 5.1 cm above the carina. The nasogastric tube extends into the stomach. The right jugular central line extends to the lower SVC. There are small to moderate pleural effusions bilaterally. There is no pneumothorax. There are patchy opacities in the lateral bases, unchanged. IMPRESSION: 1.  Support equipment appears satisfactorily positioned. 2. No significant interval change in the bilateral airspace opacities and pleural effusions. Electronically Signed   By: Andreas Newport M.D.   On: 01/31/2016 22:45   Dg Chest Port 1 View  01/31/2016  CLINICAL DATA:  Status post thoracentesis of right pleural effusion. EXAM: PORTABLE CHEST 1 VIEW COMPARISON:  Radiograph of January 30, 2016. FINDINGS: Stable cardiomediastinal silhouette. No pneumothorax is noted. Endotracheal and nasogastric tubes are unchanged in position. Right internal jugular catheter is  unchanged. Bibasilar lung opacities are noted concerning for edema, atelectasis or pneumonia. Right pleural effusion is smaller status post thoracentesis. Mild bilateral pleural effusions remain. Bony thorax is unremarkable. IMPRESSION: Stable support apparatus. No pneumothorax status post right-sided thoracentesis. Bibasilar opacities remain as described above. Electronically Signed   By: Marijo Conception, M.D.   On: 01/31/2016 16:10   Dg Abd Portable 1v  01/30/2016  CLINICAL DATA:  Nasogastric tube placement. EXAM: PORTABLE ABDOMEN - 1 VIEW COMPARISON:  01/28/2016 FINDINGS: Nasogastric tube is in place, tip overlying the level of the stomach. There is marked gaseous distension of stomach. Bowel gas pattern is otherwise nonobstructive. Surgical clips are noted in the right upper quadrant and the pelvis. Visualized osseous structures have a normal appearance. IMPRESSION: 1. Nasogastric tube placement to the stomach. 2. Marked gaseous distension of the stomach. Electronically Signed   By: Nolon Nations M.D.   On: 01/30/2016 15:10   US Thoracentesis Asp Pleural Space W/img Guide  01/31/2016  INDICATION: Right pleural effusion. EXAM: ULTRASOUND GUIDED right THORACENTESIS MEDICATIONS: None. COMPLICATIONS: None immediate. PROCEDURE: Informed consent was obtained from patient's family. Ultrasound was performed to localize and mark an adequate pocket of fluid in the right chest. The area was then prepped and draped in the normal sterile fashion. 1% Lidocaine was used for local anesthesia. Under ultrasound guidance a Safe-T-Centesis catheter was introduced. Thoracentesis was performed. The catheter was removed and a dressing applied. FINDINGS: A total of approximately 1.1 L of dark serous fluid was removed. Samples were sent to the laboratory as requested by the clinical team. IMPRESSION: Successful ultrasound guided right thoracentesis yielding 1.1 L of  pleural fluid. Electronically Signed   By: Marijo Conception,  M.D.   On: 01/31/2016 16:45     Medications:   . propofol (DIPRIVAN) infusion 50 mcg/kg/min (02/01/16 0800)   . antiseptic oral rinse  7 mL Mouth Rinse 10 times per day  . atorvastatin  40 mg Oral QPM  . budesonide (PULMICORT) nebulizer solution  0.5 mg Nebulization BID  . cefTRIAXone (ROCEPHIN)  IV  1 g Intravenous Q24H  . chlorhexidine gluconate (SAGE KIT)  15 mL Mouth Rinse BID  . DULoxetine  30 mg Oral Daily  . epoetin (EPOGEN/PROCRIT) injection  4,000 Units Intravenous Q T,Th,Sa-HD  . heparin  5,000 Units Subcutaneous Q8H  . hydrocortisone   Topical BID  . insulin aspart  0-20 Units Subcutaneous Q4H  . insulin detemir  10 Units Subcutaneous Daily  . ipratropium-albuterol  3 mL Nebulization Q4H  . irbesartan  75 mg Oral QPM  . multivitamin  1 tablet Oral Daily  . pantoprazole sodium  40 mg Per Tube QAC breakfast  . polyethylene glycol  17 g Oral BID  . sodium chloride flush  3 mL Intravenous Q12H   sodium chloride, acetaminophen **OR** acetaminophen, albuterol, docusate, fentaNYL, fentaNYL (SUBLIMAZE) injection, fentaNYL (SUBLIMAZE) injection, hydrALAZINE, midazolam, ondansetron **OR** ondansetron (ZOFRAN) IV, senna-docusate  Assessment/ Plan:  [name] is a 40 y.o. white male with End-stage renal disease with hemodialysis Started hemodialysis May 02, 2014, Hypertension, Diabetes mellitus type 2, hyperlipidemia, Multiple sclerosis, COPD, Back surgery, Cholecystectomy, Tobacco use   TTS [facility] [employer]. left arm AVF  1. End Stage Renal Disease: with pulmonary edema and hyperkalemia.  - non compliance with outpatient dialysis TTS Missed three outpatient treatments. Now with respiratory failure requiring mechanical ventilation.  - emergent hemodialysis for Thursday 6/22 and Friday 6/23 and Sunday - dilaysis planned for today - UF goal 1.5 - 2 L as tolerated  2. Acute resp failure - multifactorial - pulm edema, possible underlying pneumonia, COPD  exacerbation  3. Anemia of chronic kidney disease: hemoglobin at goal - low dose EPO with HD  4.HTN - added ARB     LOS: 7 Tanner Campbell 6/29/20178:43 AM

## 2016-02-01 NOTE — Progress Notes (Signed)
Pt to be transferred to [facility], room 5E21.  Report called to Jomarie Longs Banker) at [facility].  VSS, afebrile.  Pt received hemodialysis, 2L taken off.  Pt sedated with propofol, failed wean attempt this morning.  Anuric.  Pt CBG dropped to 59 this evening, was given 1/2 amp D50 and started on D5 drip, upon recheck CBG was 91. Follow up CBG was 73, D5 drip changed to D10 drip at 30 ml/hr.  Inquired with [place], NP, that pt may proceed with transfer to [facility].

## 2016-02-01 NOTE — Progress Notes (Signed)
PRE HD   

## 2016-02-01 NOTE — Care Management (Addendum)
Discussed LTACH with intensivist during rounds. Patient appropriate for LTACH. LM for sister, Crystal for return call. Bed available today @ [facility].

## 2016-02-01 NOTE — Progress Notes (Signed)
eLink Physician-Brief Progress Note Patient Name: Tanner Campbell DOB: 14-Feb-1976 MRN: [id number]   Date of Service  02/01/2016  HPI/Events of Note  Hypertension  eICU Interventions  PRN hydralazine     Intervention Category Intermediate Interventions: Hypertension - evaluation and management  Billy Fischer 02/01/2016, 5:01 AM

## 2016-02-01 NOTE — Progress Notes (Signed)
Tx ended    

## 2016-02-01 NOTE — Progress Notes (Signed)
Sedation vacation and wean attempt performed.  Pt off off propofol, pt moves extremities and reaches for ETT, but doesn't follow commands.  Pt placed in PSV mode 15/5 by respiratory therapy, pt RR 35-40.  Spoke with Dr. Nicholos Johns, pt placed back on full vent support and resedated.

## 2016-02-01 NOTE — Discharge Summary (Signed)
Physician Discharge Summary  Patient ID: Tanner Campbell MRN: [id number] DOB/AGE: Mar 07, 1976 40 y.o.  Admit date: 01/24/2016 Discharge date: 02/01/2016  Admission Diagnoses: Acute respiratory failure.  Volume overload.   Discharge Diagnoses:  Principal Problem:   Hypoxia Active Problems:   Dyspnea   Hyperkalemia   Acute on chronic respiratory failure (HCC)   Acute on chronic renal failure (HCC)   S/P thoracentesis   Bleeding from open wound of chest wall   Pressure ulcer Pneumonia.  Pulmonary edema, pleural effusion.   Discharged Condition: serious  Hospital Course:  Pt was admitted with Acute respiratory failure after missing dialysis. The patient was intubated because of respiratory failure, proximal me 3 days into the hospital course. Due to failure of BiPAP with continued respiratory distress. The patient was subsequently noted to be in volume overload with pulmonary edema and pleural effusions. The subsequent days, the patient was dialyzed as per nephrology recommendations. The patient was noted to have a large right-sided pleural effusion, underwent a right-sided thoracentesis with removal of just over 1 L of serous fluid, testing on the fluid is currently pending. The patient was also noted to have a pneumonia, which sputum was positive for Haemophilus and was started on antibiotics. Patient has not been in the hospital for approximately 7 days and has failed continued weaning due to volume overload and respiratory failure.  Consults: nephrology  Significant Diagnostic Studies:   Treatments: dialysis: Hemodialysis  Discharge Exam: Blood pressure 152/82, pulse 95, temperature 98.3 F (36.8 C), temperature source Axillary, resp. rate 20, height  (1.651 m), weight 141 lb 5 oz (64.1 kg), SpO2 100 %. Head: Normocephalic, without obvious abnormality, atraumatic  Please see daily progress note from today for today's discharge exam.   Disposition: LTACH      Medication List    ASK your doctor about these medications        albuterol 108 (90 Base) MCG/ACT inhaler  Commonly known as:  PROVENTIL HFA;VENTOLIN HFA  Inhale 1-2 puffs into the lungs every 6 (six) hours as needed for wheezing or shortness of breath.     amLODipine 5 MG tablet  Commonly known as:  NORVASC  Take 5 mg by mouth daily.     atorvastatin 40 MG tablet  Commonly known as:  LIPITOR  Take 40 mg by mouth every evening.     AURYXIA PO  Take 1 tablet by mouth daily.     calcium acetate 667 MG capsule  Commonly known as:  PHOSLO  Take 2,001 mg by mouth 3 (three) times daily with meals.     carvedilol 25 MG tablet  Commonly known as:  COREG  Take 25 mg by mouth 2 (two) times daily with a meal.     DULoxetine 30 MG capsule  Commonly known as:  CYMBALTA  Take 30 mg by mouth daily.     folic acid-vitamin b complex-vitamin c-selenium-zinc 3 MG Tabs tablet  Take 1 tablet by mouth daily.     furosemide 80 MG tablet  Commonly known as:  LASIX  Take 80 mg by mouth 2 (two) times daily.     gabapentin 300 MG capsule  Commonly known as:  NEURONTIN  Take 300 mg by mouth at bedtime.     lisinopril 20 MG tablet  Commonly known as:  PRINIVIL,ZESTRIL  Take 20 mg by mouth every evening.     risperiDONE 0.5 MG tablet  Commonly known as:  RISPERDAL  Take 0.5 mg by mouth at bedtime.  Signed: Shane Crutch 02/01/2016, 11:44 AM

## 2016-02-01 NOTE — Progress Notes (Signed)
[facility] [place] [facility] Progess Note  Name: Tanner Campbell MRN: [id number] DOB: 1976-01-05     ASSESSMENT/PLAN    DISCUSSION: 40 year old male presenting with acute on chronic respiratory failure secondary to pulmonary edema,  COPD exacerbation, hypokalemia, and end-stage renal disease on hemodialysis.  ASSESSMENT / PLAN:  PULMONARY A: Acute hypercarbic and hypoxic respiratory failure secondary to volume overload. Pulmonary edema., Chest x-ray reviewed today. Continued right sided effusion, continued left basal atelectasis when compared with previous. Acute COPD exacerbation -Review blood gas 7.38/54/62/31.9-consistent with chronic hypercapnic respiratory failure. Review of chest x-ray images from 6/29, in comparison with previous, there appears to be Reduced right-sided pleural effusion. -Vent settings reviewed, PRVC/15/500/30% --intubated 6/23  P:   -Weaning trial today  we will extubate if tolerated. -Daily chest x-ray. -ABG when necessary -VAP Protocol  -Daily weaning trials as tolerated  CARDIOVASCULAR A:  History of hypertension-BP is stable P:  -Hemodynamic monitoring per ICU protocol -Will start vasotec IV q6 scheduled, continue prn hydralazine.   RENAL A:   End-stage renal disease on dialysis Hyperkalemia P:   -Nephrology following, hemodialysis per nephrology -Monitor and correct electrolyte imbalance  GASTROINTESTINAL A:   Constipation, vomiting.  P:   -PPI for stress ulcer prophylaxis -Aspiration precautions -Abdominal x-ray today. -Tube feeds on hold.  HEMATOLOGIC A:   Anemia of chronic disease-hemoglobin and hematocrit stable Thrombocytopenia P:  -Trend CBC  INFECTIOUS A:   Status post Fever , continued leukocytosis- sputum culture with abundant GPB and GPC in pairs P:   -Continue cefepime as patient has penicillin  allergy -f/u final cultures, will de-escalate antibiotics.  CULTURES: MRSA screen 6/22: Blood cultures  2 negative Sputum culture: 6/24: Haemophilus influenza  ANTIBIOTICS: cefepime 6/25 >>6/27 Ceftriaxone 6/27>>  LINES/TUBES: CVC Triple Lumen 01/26/16 Right Internal jugular ET tube placed, 01/26/2016 OG tube placed 01/26/2016   ENDOCRINE A:   Type 2 diabetes mellitus, glucose, appears adequately controlled.   P:   -Point-of-care glucose testing with Sliding-scale insulin coverage  NEUROLOGIC A:   Acute hypoxic/hypercarbic encephalopathy P:   RASS goal: -1 to -2 -Treated acidosis and hypercarbia. -Continue vent support -Propofol for vent sedation and comfort  Disposition and Family update:   STUDIES:  None   SIGNIFICANT EVENTS: 01/24/2016: ED with pulmonary edema.  Best Practice: Code Status: Full code Diet: Nothing by mouth GI prophylaxis: PPI VTE prophylaxis:  SCD's /heparin  ------------------------------  ADMISSION DATE:  01/24/2016   CONSULTATION DATE:  01/26/2016  REFERRING MD :  Dr Carole Binning   Interim history. Remains intubated and sedated. No new complaints today.  VITAL SIGNS: Temp:  [98.4 F (36.9 C)-99.2 F (37.3 C)] 99.2 F (37.3 C) (06/29 0600) Pulse Rate:  [82-96] 88 (06/29 0700) Resp:  [12-48] 19 (06/29 0700) BP: (110-172)/(67-87) 132/69 mmHg (06/29 0700) SpO2:  [97 %-100 %] 100 % (06/29 0700) FiO2 (%):  [30 %] 30 % (06/29 0700) Weight:  [141 lb 5 oz (64.1 kg)] 141 lb 5 oz (64.1 kg) (06/29 0500)  PHYSICAL EXAMINATION: General: Sedated Neuro: Awake, follows some commands, moves all extremities to noxious stimulus HEENT: PERRLA, oral mucosa moist and pink Cardiovascular: RRR, S1/S2, no MRG Lungs: Bilateral airflow, diffuse rhonchi in upper lung fields Abdomen: Mildly distended, hypoactive bowel sounds, palpation reveals no organomegaly Musculoskeletal:  No visible joint deformities. Extremities: +2 pulses, no edema Skin: Skin is warm and dry without any open lesions   Recent Labs Lab 01/29/16 0447 01/30/16 0416 02/01/16 0525    NA 139 136 137  K 3.6 4.3  5.7*  CL 97* 97* 96*  CO2 34* 30 28  BUN 47* 71* 67*  CREATININE 3.55* 4.78* 3.78*  GLUCOSE 250* 126* 122*    Recent Labs Lab 01/29/16 0745 01/30/16 0416 01/31/16 1835 02/01/16 0525  HGB 10.9* 9.9* 10.3* 10.4*  HCT 34.6* 30.7*  --  32.5*  WBC 19.0* 18.1*  --  18.0*  PLT 132* 133*  --  168   Dg Chest Port 1 View  02/01/2016  CLINICAL DATA:  Acute on chronic respiratory failure ; status post thoracentesis EXAM: PORTABLE CHEST 1 VIEW COMPARISON:  Portable chest x-ray of January 31, 2016 FINDINGS: Volume of pleural fluid on the left appears decreased. There is persistent alveolar opacity in the lower left lung consistent with pneumonia. On the right a small pleural effusion persists. There is persistent alveolar opacity inferior laterally. The heart is top-normal in size. The pulmonary vascularity is not engorged. The endotracheal tube tip lies 5.3 cm above the carina. The esophagogastric tube tip projects below the inferior margin of the image. The right internal jugular venous catheter tip projects over the midportion of the SVC. IMPRESSION: Slight decrease in pleural fluid volume on the left presumably due to the reported thoracentesis. Persistent bibasilar atelectasis or pneumonia. Persistent small right pleural effusion. The support tubes are in stable position. Electronically Signed   By: David  [place] M.D.   On: 02/01/2016 07:34   Dg Chest Port 1 View  01/31/2016  CLINICAL DATA:  Respiratory failure. EXAM: PORTABLE CHEST 1 VIEW COMPARISON:  01/31/2016 at 15:57 FINDINGS: The endotracheal tube is 5.1 cm above the carina. The nasogastric tube extends into the stomach. The right jugular central line extends to the lower SVC. There are small to moderate pleural effusions bilaterally. There is no pneumothorax. There are patchy opacities in the lateral bases, unchanged. IMPRESSION: 1.  Support equipment appears satisfactorily positioned. 2. No significant interval change  in the bilateral airspace opacities and pleural effusions. Electronically Signed   By: Ellery Plunk M.D.   On: 01/31/2016 22:45   Dg Chest Port 1 View  01/31/2016  CLINICAL DATA:  Status post thoracentesis of right pleural effusion. EXAM: PORTABLE CHEST 1 VIEW COMPARISON:  Radiograph of January 30, 2016. FINDINGS: Stable cardiomediastinal silhouette. No pneumothorax is noted. Endotracheal and nasogastric tubes are unchanged in position. Right internal jugular catheter is unchanged. Bibasilar lung opacities are noted concerning for edema, atelectasis or pneumonia. Right pleural effusion is smaller status post thoracentesis. Mild bilateral pleural effusions remain. Bony thorax is unremarkable. IMPRESSION: Stable support apparatus. No pneumothorax status post right-sided thoracentesis. Bibasilar opacities remain as described above. Electronically Signed   By: Lupita Raider, M.D.   On: 01/31/2016 16:10   Dg Abd Portable 1v  01/30/2016  CLINICAL DATA:  Nasogastric tube placement. EXAM: PORTABLE ABDOMEN - 1 VIEW COMPARISON:  01/28/2016 FINDINGS: Nasogastric tube is in place, tip overlying the level of the stomach. There is marked gaseous distension of stomach. Bowel gas pattern is otherwise nonobstructive. Surgical clips are noted in the right upper quadrant and the pelvis. Visualized osseous structures have a normal appearance. IMPRESSION: 1. Nasogastric tube placement to the stomach. 2. Marked gaseous distension of the stomach. Electronically Signed   By: Norva Pavlov M.D.   On: 01/30/2016 15:10   US Thoracentesis Asp Pleural Space W/img Guide  01/31/2016  INDICATION: Right pleural effusion. EXAM: ULTRASOUND GUIDED right THORACENTESIS MEDICATIONS: None. COMPLICATIONS: None immediate. PROCEDURE: Informed consent was obtained from patient's family. Ultrasound was performed to localize and mark  an adequate pocket of fluid in the right chest. The area was then prepped and draped in the normal sterile  fashion. 1% Lidocaine was used for local anesthesia. Under ultrasound guidance a Safe-T-Centesis catheter was introduced. Thoracentesis was performed. The catheter was removed and a dressing applied. FINDINGS: A total of approximately 1.1 L of dark serous fluid was removed. Samples were sent to the laboratory as requested by the clinical team. IMPRESSION: Successful ultrasound guided right thoracentesis yielding 1.1 L of pleural fluid. Electronically Signed   By: Lupita Raider, M.D.   On: 01/31/2016 16:45     -Deep Manika Hast M.D.  Critical Care Attestation.  I have personally obtained a history, examined the patient, evaluated laboratory and imaging results, formulated the assessment and plan and placed orders. The Patient requires high complexity decision making for assessment and support, frequent evaluation and titration of therapies, application of advanced monitoring technologies and extensive interpretation of multiple databases. The patient has critical illness that could lead imminently to failure of 1 or more organ systems and requires the highest level of physician preparedness to intervene.  Critical Care Time devoted to patient care services described in this note is 40 minutes and is exclusive of time spent in procedures.

## 2016-02-01 NOTE — Progress Notes (Signed)
Conferred with [place], NP of whether to proceed with transfer to [facility] with decresed CBG.  Pt remains in normal range and D10 drip started.  Continue with the transfer at this time per Bincy, NP.

## 2016-02-01 NOTE — Progress Notes (Signed)
HD TX started  

## 2016-02-01 NOTE — Care Management (Signed)
Spoke with sister by phone in conjunction with [facility] representative. Explained LTACH. She is agreeable to transfer. Bed available today. Primary nurse updated. Dr.Ram to complete discharge.

## 2016-02-01 NOTE — Progress Notes (Signed)
This note also relates to the following rows which could not be included: SpO2 - Cannot attach notes to unvalidated device data     02/01/16 0910  Vent [facility]  Invasive or Noninvasive Invasive  Adult Vent Y  Adult Ventilator Settings  Vent Type Servo i  Humidity HME  Vent Mode PSV  FiO2 (%) 30 %  I Time 1 Sec(s)  Pressure Support 15 cmH20  PEEP 5 cmH20  Placed patient on PSV 15/5 per patient demand and he did not tolerate resulting in tachypnea of RR of 45.  RN at bedside.  Returned patient to previous settings.

## 2016-02-01 NOTE — Progress Notes (Signed)
Pt being transferred to [facility] via [facility].  Report given to [place].  Pt's vital signs are stable.  Pt remains on vent.  Flexi seal in place. Pt remains on propofol and D10 drips.

## 2016-02-01 NOTE — Progress Notes (Signed)
Post HD  

## 2016-02-02 ENCOUNTER — Other Ambulatory Visit (HOSPITAL_COMMUNITY): Payer: Medicare Other

## 2016-02-02 LAB — CBC WITH DIFFERENTIAL/PLATELET
BASOS PCT: 0 %
Basophils Absolute: 0 10*3/uL (ref 0.0–0.1)
EOS ABS: 0.1 10*3/uL (ref 0.0–0.7)
Eosinophils Relative: 0 %
HCT: 35.1 % — ABNORMAL LOW (ref 39.0–52.0)
HEMOGLOBIN: 10.4 g/dL — AB (ref 13.0–17.0)
Lymphocytes Relative: 9 %
Lymphs Abs: 1.5 10*3/uL (ref 0.7–4.0)
MCH: 26.6 pg (ref 26.0–34.0)
MCHC: 29.6 g/dL — ABNORMAL LOW (ref 30.0–36.0)
MCV: 89.8 fL (ref 78.0–100.0)
Monocytes Absolute: 1.4 10*3/uL — ABNORMAL HIGH (ref 0.1–1.0)
Monocytes Relative: 9 %
NEUTROS PCT: 82 %
Neutro Abs: 12.9 10*3/uL — ABNORMAL HIGH (ref 1.7–7.7)
Platelets: 187 10*3/uL (ref 150–400)
RBC: 3.91 MIL/uL — AB (ref 4.22–5.81)
RDW: 16.8 % — ABNORMAL HIGH (ref 11.5–15.5)
WBC: 15.9 10*3/uL — AB (ref 4.0–10.5)

## 2016-02-02 LAB — COMPREHENSIVE METABOLIC PANEL
ALBUMIN: 1.6 g/dL — AB (ref 3.5–5.0)
ALK PHOS: 227 U/L — AB (ref 38–126)
ALT: 16 U/L — ABNORMAL LOW (ref 17–63)
ANION GAP: 13 (ref 5–15)
AST: 18 U/L (ref 15–41)
BUN: 34 mg/dL — AB (ref 6–20)
CALCIUM: 8.5 mg/dL — AB (ref 8.9–10.3)
CO2: 29 mmol/L (ref 22–32)
Chloride: 95 mmol/L — ABNORMAL LOW (ref 101–111)
Creatinine, Ser: 2.9 mg/dL — ABNORMAL HIGH (ref 0.61–1.24)
GFR calc Af Amer: 29 mL/min — ABNORMAL LOW (ref >60)
GFR calc non Af Amer: 25 mL/min — ABNORMAL LOW (ref >60)
GLUCOSE: 97 mg/dL (ref 65–99)
POTASSIUM: 4.4 mmol/L (ref 3.5–5.1)
SODIUM: 137 mmol/L (ref 135–145)
Total Bilirubin: 0.9 mg/dL (ref 0.3–1.2)
Total Protein: 5.7 g/dL — ABNORMAL LOW (ref 6.5–8.1)

## 2016-02-02 LAB — PHOSPHORUS: Phosphorus: 3.2 mg/dL (ref 2.5–4.6)

## 2016-02-02 LAB — MAGNESIUM: Magnesium: 1.9 mg/dL (ref 1.7–2.4)

## 2016-02-02 LAB — PROTIME-INR
INR: 1.16 (ref 0.00–1.49)
PROTHROMBIN TIME: 14.9 s (ref 11.6–15.2)

## 2016-02-02 LAB — CYTOLOGY - NON PAP

## 2016-02-02 LAB — TSH: TSH: 2.614 u[IU]/mL (ref 0.350–4.500)

## 2016-02-02 NOTE — Progress Notes (Signed)
[facility]  ROUNDING NOTE   Subjective:  Patient well known to [place] from [facility] and as an outpatient. Currently has respiratory failure and remains on the ventilator. Patient has had multiple weaning attempts that have been unsuccessful. Patient completed hemodialysis yesterday.    Objective:  Vital signs in last 24 hours:  Pulse 95 respirations 17 blood pressure 153/83 pulse ox 100% on 30% FiO2.     Physical Exam: General: Critically ill appearing  Head: +ETT +OG  Eyes: Anicteric  Neck: Supple, trachea midline  Lungs:  Vent assisted bilateral rhonchi  Heart: Regular rate and rhythm no rubs  Abdomen:  Soft, nontender. BS present  Extremities: No peripheral edema.  Neurologic: Not following commands  Skin: No lesions  Access: Left arm AVF good thrill    Basic Metabolic Panel:  Recent Labs Lab 01/26/16 1930  01/27/16 0445 01/27/16 1649 01/28/16 0404 01/29/16 0447 01/29/16 0745 01/30/16 0416 02/01/16 0525 02/02/16 0650  NA  --   < > 141  --  140 139  --  136 137 137  K  --   < > 3.9  --  3.7 3.6  --  4.3 5.7* 4.4  CL  --   < > 100*  --  97* 97*  --  97* 96* 95*  CO2  --   < > 30  --  31 34*  --  GLUCOSE  --   < > 88  --  211* 250*  --  126* 122* 97  BUN  --   < > 43*  --  61* 47*  --  71* 67* 34*  CREATININE  --   < > 4.08*  --  5.11* 3.55*  --  4.78* 3.78* 2.90*  CALCIUM  --   < > 8.4*  --  8.5* 8.3*  --  8.1* 8.4* 8.5*  MG 2.0  --  2.0 2.1  --   --  2.1  --   --  1.9  PHOS 3.7  --  3.9 4.0  --  1.6*  --  1.8*  --  3.2  < > = values in this interval not displayed.  Liver Function Tests:  Recent Labs Lab 01/29/16 0447 02/02/16 0650  AST  --  18  ALT  --  16*  ALKPHOS  --  227*  BILITOT  --  0.9  PROT  --  5.7*  ALBUMIN 2.1* 1.6*   No results for input(s): LIPASE, AMYLASE in the last 168 hours. No results for input(s): AMMONIA in the last 168 hours.  CBC:  Recent Labs Lab 01/28/16 0349 01/29/16 0745  01/30/16 0416 01/31/16 1835 02/01/16 0525 02/02/16 0650  WBC 14.1* 19.0* 18.1*  --  18.0* 15.9*  NEUTROABS  --   --   --   --  15.8* 12.9*  HGB 10.8* 10.9* 9.9* 10.3* 10.4* 10.4*  HCT 34.2* 34.6* 30.7*  --  32.5* 35.1*  MCV 86.9 89.3 86.7  --  85.6 89.8  PLT 128* 132* 133*  --  168 187    Cardiac Enzymes: No results for input(s): CKTOTAL, CKMB, CKMBINDEX, TROPONINI in the last 168 hours.  BNP: Invalid input(s): POCBNP  CBG:  Recent Labs Lab 02/01/16 1647 02/01/16 1728 02/01/16 1850 02/01/16 1950 02/01/16 2019  GLUCAP 56* 91 73 63* 175*    Microbiology: Results for orders placed or performed during the hospital encounter of 01/24/16  MRSA PCR Screening     Status: None  Collection Time: 01/25/16  2:22 AM  Result Value Ref Range Status   MRSA by PCR NEGATIVE NEGATIVE Final    Comment:        The GeneXpert MRSA Assay (FDA approved for NASAL specimens only), is one component of a comprehensive MRSA colonization surveillance program. It is not intended to diagnose MRSA infection nor to guide or monitor treatment for MRSA infections.   Culture, expectorated sputum-assessment     Status: None   Collection Time: 01/27/16  4:45 AM  Result Value Ref Range Status   Specimen Description TRACHEAL ASPIRATE  Final   Special Requests NONE  Final   Sputum evaluation THIS SPECIMEN IS ACCEPTABLE FOR SPUTUM CULTURE  Final   Report Status 01/27/2016 FINAL  Final  Culture, respiratory (NON-Expectorated)     Status: None   Collection Time: 01/27/16  4:45 AM  Result Value Ref Range Status   Specimen Description TRACHEAL ASPIRATE  Final   Special Requests NONE Reflexed from [id number]  Final   Gram Stain   Final    ABUNDANT WBC PRESENT, PREDOMINANTLY PMN NO SQUAMOUS EPITHELIAL CELLS SEEN ABUNDANT GRAM NEGATIVE COCCOBACILLI FEW GRAM POSITIVE COCCI IN PAIRS RARE GRAM POSITIVE RODS    Culture   Final    ABUNDANT HAEMOPHILUS INFLUENZAE BETA LACTAMASE NEGATIVE Performed at [facility]    Report Status 01/29/2016 FINAL  Final  Culture, blood (Routine X 2) w Reflex to ID Panel     Status: None   Collection Time: 01/27/16  5:18 AM  Result Value Ref Range Status   Specimen Description BLOOD RIGHT ARM  Final   Special Requests BOTTLES DRAWN AEROBIC AND ANAEROBIC 5CC  Final   Culture NO GROWTH 5 DAYS  Final   Report Status 02/01/2016 FINAL  Final  Culture, blood (Routine X 2) w Reflex to ID Panel     Status: None   Collection Time: 01/27/16  5:23 AM  Result Value Ref Range Status   Specimen Description BLOOD RIGHT HAND  Final   Special Requests BOTTLES DRAWN AEROBIC AND ANAEROBIC 5CC  Final   Culture NO GROWTH 5 DAYS  Final   Report Status 02/01/2016 FINAL  Final  Body fluid culture     Status: None (Preliminary result)   Collection Time: 01/31/16  3:45 PM  Result Value Ref Range Status   Specimen Description PLEURAL  Final   Special Requests NONE  Final   Gram Stain   Final    FEW WBC PRESENT,BOTH PMN AND MONONUCLEAR NO ORGANISMS SEEN    Culture   Final    NO GROWTH 2 DAYS NO ANAEROBES ISOLATED; CULTURE IN PROGRESS FOR 5 DAYS Performed at [facility]    Report Status PENDING  Incomplete    Coagulation Studies:  Recent Labs  01/31/16 2019 02/02/16 0650  LABPROT 14.9 14.9  INR 1.15 1.16    Urinalysis: No results for input(s): COLORURINE, LABSPEC, PHURINE, GLUCOSEU, HGBUR, BILIRUBINUR, KETONESUR, PROTEINUR, UROBILINOGEN, NITRITE, LEUKOCYTESUR in the last 72 hours.  Invalid input(s): APPERANCEUR    Imaging: Dg Abd 1 View  02/01/2016  CLINICAL DATA:  Pt admitted 6/21, pt is currently intubated and vomiting.known history of End-stage renal disease on dialysis, COPD on home oxygen neuropathy, hyperlipidemia, hypertension, diabetes mellitus type 2. EXAM: ABDOMEN - 1 VIEW COMPARISON:  01/30/2016 FINDINGS: Surgical clips are noted in the right upper quadrant the abdomen. Faint calcifications are identified overlying the renal shadows,  consistent with nephrocalcinosis. Arterial calcifications also noted. Bowel gas pattern is nonobstructed.  There is gaseous distension of the transverse colon. IMPRESSION: Nonobstructive bowel gas pattern.  Nephrocalcinosis. Electronically Signed   By: Norva Pavlov M.D.   On: 02/01/2016 09:05   Dg Chest Port 1 View  02/02/2016  CLINICAL DATA:  CHF, COPD, sepsis, acute and chronic respiratory failure. EXAM: PORTABLE CHEST 1 VIEW COMPARISON:  Portable chest x-ray of February 01, 2016 FINDINGS: The lungs are adequately inflated. Bibasilar atelectasis or pneumonia persists. There small bilateral pleural effusions. There is no pneumothorax. The pulmonary interstitial markings are more conspicuous bilaterally. The cardiac silhouette is top-normal in size. The endotracheal tube tip lies 4.9 cm above the carina. The esophagogastric tube tip projects below the inferior margin of the image. The right internal jugular venous catheter tip projects over the mid to distal SVC. IMPRESSION: Slight interval deterioration in the appearance of the pulmonary interstitium consistent with increased interstitial edema. Persistent bibasilar atelectasis or pneumonia with small bilateral pleural effusions. The support tubes are in stable position. Electronically Signed   By: David  [place] M.D.   On: 02/02/2016 07:25   Dg Chest Port 1 View  02/01/2016  CLINICAL DATA:  Acute on chronic respiratory failure ; status post thoracentesis EXAM: PORTABLE CHEST 1 VIEW COMPARISON:  Portable chest x-ray of January 31, 2016 FINDINGS: Volume of pleural fluid on the left appears decreased. There is persistent alveolar opacity in the lower left lung consistent with pneumonia. On the right a small pleural effusion persists. There is persistent alveolar opacity inferior laterally. The heart is top-normal in size. The pulmonary vascularity is not engorged. The endotracheal tube tip lies 5.3 cm above the carina. The esophagogastric tube tip projects below the  inferior margin of the image. The right internal jugular venous catheter tip projects over the midportion of the SVC. IMPRESSION: Slight decrease in pleural fluid volume on the left presumably due to the reported thoracentesis. Persistent bibasilar atelectasis or pneumonia. Persistent small right pleural effusion. The support tubes are in stable position. Electronically Signed   By: David  [place] M.D.   On: 02/01/2016 07:34   Dg Chest Port 1 View  01/31/2016  CLINICAL DATA:  Respiratory failure. EXAM: PORTABLE CHEST 1 VIEW COMPARISON:  01/31/2016 at 15:57 FINDINGS: The endotracheal tube is 5.1 cm above the carina. The nasogastric tube extends into the stomach. The right jugular central line extends to the lower SVC. There are small to moderate pleural effusions bilaterally. There is no pneumothorax. There are patchy opacities in the lateral bases, unchanged. IMPRESSION: 1.  Support equipment appears satisfactorily positioned. 2. No significant interval change in the bilateral airspace opacities and pleural effusions. Electronically Signed   By: Ellery Plunk M.D.   On: 01/31/2016 22:45   Dg Chest Port 1 View  01/31/2016  CLINICAL DATA:  Status post thoracentesis of right pleural effusion. EXAM: PORTABLE CHEST 1 VIEW COMPARISON:  Radiograph of January 30, 2016. FINDINGS: Stable cardiomediastinal silhouette. No pneumothorax is noted. Endotracheal and nasogastric tubes are unchanged in position. Right internal jugular catheter is unchanged. Bibasilar lung opacities are noted concerning for edema, atelectasis or pneumonia. Right pleural effusion is smaller status post thoracentesis. Mild bilateral pleural effusions remain. Bony thorax is unremarkable. IMPRESSION: Stable support apparatus. No pneumothorax status post right-sided thoracentesis. Bibasilar opacities remain as described above. Electronically Signed   By: Lupita Raider, M.D.   On: 01/31/2016 16:10   Dg Abd Portable 1v  02/02/2016  CLINICAL DATA:   Orogastric tube placement.  End-stage renal disease. EXAM: PORTABLE ABDOMEN - 1 VIEW COMPARISON:  02/01/2016. FINDINGS: Orogastric tube noted with tip in side hole in the stomach. Surgical clips right upper quadrant. Soft tissues of the abdomen are unremarkable. No bowel distention. Bibasilar pulmonary infiltrates and pleural effusions noted. Bilateral nephrocalcinosis again noted. Aortoiliac and visceral vascular calcifications. IMPRESSION: 1. Orogastric tube noted with tip and side hole in the stomach. No acute intra-abdominal abnormality . 2.  Bibasilar pulmonary infiltrates and bilateral pleural effusions. 3.  Aortoiliac and visceral atherosclerotic vascular disease. 4. Bilateral nephrocalcinosis again noted. This may be related to the patient's known end-stage renal disease. Electronically Signed   By: Maisie Fus  Register   On: 02/02/2016 07:32   US Thoracentesis Asp Pleural Space W/img Guide  01/31/2016  INDICATION: Right pleural effusion. EXAM: ULTRASOUND GUIDED right THORACENTESIS MEDICATIONS: None. COMPLICATIONS: None immediate. PROCEDURE: Informed consent was obtained from patient's family. Ultrasound was performed to localize and mark an adequate pocket of fluid in the right chest. The area was then prepped and draped in the normal sterile fashion. 1% Lidocaine was used for local anesthesia. Under ultrasound guidance a Safe-T-Centesis catheter was introduced. Thoracentesis was performed. The catheter was removed and a dressing applied. FINDINGS: A total of approximately 1.1 L of dark serous fluid was removed. Samples were sent to the laboratory as requested by the clinical team. IMPRESSION: Successful ultrasound guided right thoracentesis yielding 1.1 L of pleural fluid. Electronically Signed   By: Lupita Raider, M.D.   On: 01/31/2016 16:45     Medications:       Assessment/ Plan:  [name] is a 40 y.o. white male with End-stage renal disease with hemodialysis Started hemodialysis  May 02, 2014, Hypertension, Diabetes mellitus type 2, hyperlipidemia, Multiple sclerosis, COPD, Back surgery, Cholecystectomy, Tobacco use, acute respiratory failure 01/2016 originally at [facility] then transitioned to [facility]  TTS [facility]. left arm AVF  1. End Stage Renal Disease: with pulmonary edema and hyperkalemia.  - patient completed hemodialysis yesterday. We will plan for dialysis again tomorrow. Ultrafiltration target 2 kg.  2. Acute resp failure - multifactorial - pulm edema, possible underlying pneumonia, COPD exacerbation - continue ventilatory support at the moment. Continue weaning efforts at [employer].  3. Anemia of chronic kidney disease: hemoglobin currently 10.4. Consider Aranesp if hemoglobin drops.  4. Secondary hyperparathyroidism. Phosphorus currently 3.2. Check intact PTH as well.    LOS:  Tanner Campbell 6/30/20179:10 AM

## 2016-02-03 LAB — BENZODIAZEPINES,MS,WB/SP RFX
7-AMINOCLONAZEPAM: NEGATIVE ng/mL
ALPRAZOLAM: NEGATIVE ng/mL
Benzodiazepines Confirm: POSITIVE
Chlordiazepoxide: NEGATIVE ng/mL
Clonazepam: NEGATIVE ng/mL
Desalkylflurazepam: NEGATIVE ng/mL
Desmethylchlordiazepoxide: NEGATIVE ng/mL
Desmethyldiazepam: NEGATIVE ng/mL
Diazepam: 13 ng/mL
FLURAZEPAM: NEGATIVE ng/mL
LORAZEPAM: NEGATIVE ng/mL
Midazolam: 23 ng/mL
OXAZEPAM: NEGATIVE ng/mL
TEMAZEPAM: NEGATIVE ng/mL
Triazolam: NEGATIVE ng/mL

## 2016-02-03 LAB — CBC
HCT: 37.7 % — ABNORMAL LOW (ref 39.0–52.0)
HEMOGLOBIN: 11.4 g/dL — AB (ref 13.0–17.0)
MCH: 27.1 pg (ref 26.0–34.0)
MCHC: 30.2 g/dL (ref 30.0–36.0)
MCV: 89.8 fL (ref 78.0–100.0)
Platelets: 271 10*3/uL (ref 150–400)
RBC: 4.2 MIL/uL — AB (ref 4.22–5.81)
RDW: 16.7 % — ABNORMAL HIGH (ref 11.5–15.5)
WBC: 19.1 10*3/uL — AB (ref 4.0–10.5)

## 2016-02-03 LAB — RENAL FUNCTION PANEL
ALBUMIN: 1.8 g/dL — AB (ref 3.5–5.0)
ANION GAP: 23 — AB (ref 5–15)
BUN: 61 mg/dL — ABNORMAL HIGH (ref 6–20)
CALCIUM: 8.8 mg/dL — AB (ref 8.9–10.3)
CO2: 20 mmol/L — ABNORMAL LOW (ref 22–32)
Chloride: 93 mmol/L — ABNORMAL LOW (ref 101–111)
Creatinine, Ser: 4.21 mg/dL — ABNORMAL HIGH (ref 0.61–1.24)
GFR calc non Af Amer: 16 mL/min — ABNORMAL LOW (ref >60)
GFR, EST AFRICAN AMERICAN: 19 mL/min — AB (ref >60)
GLUCOSE: 380 mg/dL — AB (ref 65–99)
PHOSPHORUS: 7.4 mg/dL — AB (ref 2.5–4.6)
Potassium: 5.9 mmol/L — ABNORMAL HIGH (ref 3.5–5.1)
SODIUM: 136 mmol/L (ref 135–145)

## 2016-02-03 LAB — DRUG SCREEN 10 W/CONF, SERUM
Amphetamines, IA: NEGATIVE ng/mL
Barbiturates, IA: NEGATIVE ug/mL
Benzodiazepines, IA: POSITIVE ng/mL
COCAINE & METABOLITE, IA: NEGATIVE ng/mL
Methadone, IA: NEGATIVE ng/mL
OPIATES, IA: NEGATIVE ng/mL
OXYCODONES, IA: NEGATIVE ng/mL
PHENCYCLIDINE, IA: NEGATIVE ng/mL
Propoxyphene, IA: NEGATIVE ng/mL
THC(MARIJUANA) METABOLITE, IA: NEGATIVE ng/mL

## 2016-02-03 LAB — OPIATES,MS,WB/SP RFX
6-ACETYLMORPHINE: NEGATIVE
Codeine: NEGATIVE ng/mL
DIHYDROCODEINE: NEGATIVE ng/mL
HYDROCODONE: NEGATIVE ng/mL
HYDROMORPHONE: NEGATIVE ng/mL
MORPHINE: NEGATIVE ng/mL
Opiate Confirmation: NEGATIVE

## 2016-02-03 LAB — HEMOGLOBIN A1C
HEMOGLOBIN A1C: 7.7 % — AB (ref 4.8–5.6)
Mean Plasma Glucose: 174 mg/dL

## 2016-02-04 ENCOUNTER — Other Ambulatory Visit (HOSPITAL_COMMUNITY): Payer: Medicare Other

## 2016-02-04 LAB — URINALYSIS, ROUTINE W REFLEX MICROSCOPIC
GLUCOSE, UA: 500 mg/dL — AB
Ketones, ur: NEGATIVE mg/dL
NITRITE: NEGATIVE
SPECIFIC GRAVITY, URINE: 1.025 (ref 1.005–1.030)
pH: 7.5 (ref 5.0–8.0)

## 2016-02-04 LAB — BODY FLUID CULTURE: CULTURE: NO GROWTH

## 2016-02-04 LAB — BASIC METABOLIC PANEL
ANION GAP: 12 (ref 5–15)
BUN: 68 mg/dL — ABNORMAL HIGH (ref 6–20)
CHLORIDE: 97 mmol/L — AB (ref 101–111)
CO2: 27 mmol/L (ref 22–32)
Calcium: 8.7 mg/dL — ABNORMAL LOW (ref 8.9–10.3)
Creatinine, Ser: 3.32 mg/dL — ABNORMAL HIGH (ref 0.61–1.24)
GFR calc non Af Amer: 22 mL/min — ABNORMAL LOW (ref >60)
GFR, EST AFRICAN AMERICAN: 25 mL/min — AB (ref >60)
Glucose, Bld: 202 mg/dL — ABNORMAL HIGH (ref 65–99)
POTASSIUM: 3.8 mmol/L (ref 3.5–5.1)
Sodium: 136 mmol/L (ref 135–145)

## 2016-02-04 LAB — CBC
HEMATOCRIT: 34.5 % — AB (ref 39.0–52.0)
HEMOGLOBIN: 10.6 g/dL — AB (ref 13.0–17.0)
MCH: 27.2 pg (ref 26.0–34.0)
MCHC: 30.7 g/dL (ref 30.0–36.0)
MCV: 88.5 fL (ref 78.0–100.0)
Platelets: 254 10*3/uL (ref 150–400)
RBC: 3.9 MIL/uL — ABNORMAL LOW (ref 4.22–5.81)
RDW: 16.3 % — ABNORMAL HIGH (ref 11.5–15.5)
WBC: 23.2 10*3/uL — ABNORMAL HIGH (ref 4.0–10.5)

## 2016-02-04 LAB — PTH, INTACT AND CALCIUM
CALCIUM TOTAL (PTH): 8.5 mg/dL — AB (ref 8.7–10.2)
PTH: 107 pg/mL — AB (ref 15–65)

## 2016-02-04 LAB — URINE MICROSCOPIC-ADD ON: SQUAMOUS EPITHELIAL / LPF: NONE SEEN

## 2016-02-04 LAB — PROCALCITONIN: PROCALCITONIN: 4.55 ng/mL

## 2016-02-05 DIAGNOSIS — R011 Cardiac murmur, unspecified: Secondary | ICD-10-CM

## 2016-02-05 DIAGNOSIS — J9601 Acute respiratory failure with hypoxia: Secondary | ICD-10-CM | POA: Diagnosis not present

## 2016-02-05 DIAGNOSIS — N186 End stage renal disease: Secondary | ICD-10-CM | POA: Diagnosis not present

## 2016-02-05 DIAGNOSIS — J189 Pneumonia, unspecified organism: Secondary | ICD-10-CM | POA: Diagnosis not present

## 2016-02-05 DIAGNOSIS — Z992 Dependence on renal dialysis: Secondary | ICD-10-CM

## 2016-02-05 LAB — COMP PANEL: LEUKEMIA/LYMPHOMA

## 2016-02-05 LAB — URINE CULTURE: CULTURE: NO GROWTH

## 2016-02-05 NOTE — Progress Notes (Signed)
[facility]  ROUNDING NOTE   Subjective:  The patient remains on the ventilator at this point in time. We plan to transition him to Monday, Wednesday, Friday dialysis schedule. The BBC count up to 23.2.    Objective:  Vital signs in last 24 hours:  Temperature 99.3 pulse 118 respirations 25 blood pressure 176/95     Physical Exam: General: Critically ill appearing  Head: +ETT +OG  Eyes: Anicteric  Neck: Supple, trachea midline  Lungs:  Vent assisted bilateral rhonchi  Heart: Tachycardic, 3/6 systolic ejection murmur  Abdomen:  Soft, nontender. BS present  Extremities: No peripheral edema.  Neurologic: Arousable, will follow simple commands  Skin: No lesions  Access: Left arm AVF good thrill    Basic Metabolic Panel:  Recent Labs Lab 01/30/16 0416 02/01/16 0525 02/02/16 0650 02/03/16 0600 02/03/16 0601 02/04/16 1327  NA 136 137 137  --  136 136  K 4.3 5.7* 4.4  --  5.9* 3.8  CL 97* 96* 95*  --  93* 97*  CO2 30 28 29   --  20* 27  GLUCOSE 126* 122* 97  --  380* 202*  BUN 71* 67* 34*  --  61* 68*  CREATININE 4.78* 3.78* 2.90*  --  4.21* 3.32*  CALCIUM 8.1* 8.4* 8.5* 8.5* 8.8* 8.7*  MG  --   --  1.9  --   --   --   PHOS 1.8*  --  3.2  --  7.4*  --     Liver Function Tests:  Recent Labs Lab 02/02/16 0650 02/03/16 0601  AST 18  --   ALT 16*  --   ALKPHOS 227*  --   BILITOT 0.9  --   PROT 5.7*  --   ALBUMIN 1.6* 1.8*   No results for input(s): LIPASE, AMYLASE in the last 168 hours. No results for input(s): AMMONIA in the last 168 hours.  CBC:  Recent Labs Lab 01/30/16 0416 01/31/16 1835 02/01/16 0525 02/02/16 0650 02/03/16 0600 02/04/16 1327  WBC 18.1*  --  18.0* 15.9* 19.1* 23.2*  NEUTROABS  --   --  15.8* 12.9*  --   --   HGB 9.9* 10.3* 10.4* 10.4* 11.4* 10.6*  HCT 30.7*  --  32.5* 35.1* 37.7* 34.5*  MCV 86.7  --  85.6 89.8 89.8 88.5  PLT 133*  --  168 187 271 254    Cardiac Enzymes: No results for input(s): CKTOTAL, CKMB,  CKMBINDEX, TROPONINI in the last 168 hours.  BNP: Invalid input(s): POCBNP  CBG:  Recent Labs Lab 02/01/16 1647 02/01/16 1728 02/01/16 1850 02/01/16 1950 02/01/16 2019  GLUCAP 56* 91 73 63* 175*    Microbiology: Results for orders placed or performed during the hospital encounter of 02/01/16  Culture, respiratory (NON-Expectorated)     Status: None (Preliminary result)   Collection Time: 02/04/16  3:00 PM  Result Value Ref Range Status   Specimen Description TRACHEAL ASPIRATE  Final   Special Requests NONE  Final   Gram Stain   Final    MODERATE WBC PRESENT, PREDOMINANTLY PMN RARE SQUAMOUS EPITHELIAL CELLS PRESENT FEW GRAM POSITIVE RODS RARE GRAM POSITIVE COCCI IN PAIRS RARE GRAM NEGATIVE RODS    Culture PENDING  Incomplete   Report Status PENDING  Incomplete    Coagulation Studies: No results for input(s): LABPROT, INR in the last 72 hours.  Urinalysis:  Recent Labs  02/04/16 1426  COLORURINE RED*  LABSPEC 1.025  PHURINE 7.5  GLUCOSEU 500*  HGBUR  SMALL*  BILIRUBINUR SMALL*  KETONESUR NEGATIVE  PROTEINUR >300*  NITRITE NEGATIVE  LEUKOCYTESUR TRACE*      Imaging: Dg Chest Port 1 View  02/04/2016  CLINICAL DATA:  Followup pneumonia EXAM: PORTABLE CHEST 1 VIEW COMPARISON:  02/02/2016 FINDINGS: Cardiac shadow is stable. Endotracheal tube, nasogastric catheter and right jugular line are again seen and stable. Bibasilar infiltrates as well as small effusions are again seen and stable. No new focal abnormality is noted. IMPRESSION: No change from the prior exam.  Stable bibasilar infiltrates. Electronically Signed   By: Alcide Clever M.D.   On: 02/04/2016 14:08     Medications:       Assessment/ Plan:  Tanner Campbell is a 40 y.o. white male with End-stage renal disease with hemodialysis Started hemodialysis May 02, 2014, Hypertension, Diabetes mellitus type 2, hyperlipidemia, Multiple sclerosis, COPD, Back surgery, Cholecystectomy, Tobacco use,  acute respiratory failure 01/2016 originally at [facility] then transitioned to [facility]  TTS [facility] left arm AVF  1. End Stage Renal Disease: with pulmonary edema and hyperkalemia.  - we will transition patient to Monday, Wednesday, Friday dialysis schedule. Potassium was 5.9 on 02/03/2016 but was down to 3.8 yesterday. Continue to monitor.  2. Acute resp failure - multifactorial - pulm edema, possible underlying pneumonia, COPD exacerbation - patient currently on FiO2 of 28%. Continue ventilatory support for now.  3. Anemia of chronic kidney disease: hemoglobin acceptable at 10.6. Hold off on Aranesp at this point in time.  4. Secondary hyperparathyroidism. Last phosphorus was a bit high at 7.4. Continue to monitor serum phosphorus.   LOS:  Tanner Campbell 7/3/20179:03 AM

## 2016-02-05 NOTE — Consult Note (Signed)
Name: Tanner Campbell MRN: [id number] DOB: January 03, 1976    ADMISSION DATE:  02/01/2016 CONSULTATION DATE:  02/05/16  REFERRING MD :  Dr. Sharyon Medicus / [facility]   CHIEF COMPLAINT:  Acute Respiratory Failure   HISTORY OF PRESENT ILLNESS:  40 y/o M with PMH of DM II, HTN, HLD, ESRD on HD (T, Th, S), COPD and prior back surgery who initially was admitted to [facility] from 6/21 - 6/29 for acute respiratory failure in the setting of volume overload.  He was admitted by the hospitalist service for further evaluation.  He was treated with BiPAP on admit.  Unfortunately, he decompensated 6/23 and required intubation.  The patient was found to have H. Influenza positive sputum and treated with rocephin.  CXR demonstrated a large right pleural effusion and underwent a thoracentesis (1L serous fluid removed, likely exudative by protein). He had difficulty with weaning and was transferred to [facility] on 6/29 for further evaluation / ventilator weaning.  The patient developed fever and antibiotics were expanded to vancomycin, cefepime and diflucan.  He has progressed to weaning on PSV x 4 hours as of 7/3.    PCCM consulted for evaluation of respiratory failure / ventilator weaning.   PAST MEDICAL HISTORY :   has a past medical history of Diabetes mellitus without complication (HCC); Hypertension; COPD (chronic obstructive pulmonary disease) (HCC); Renal disorder; Neuropathy (HCC); Hemodialysis patient (HCC); and High cholesterol.  has past surgical history that includes Cholecystectomy; Back surgery; and Neck surgery.   Prior to Admission medications   Medication Sig Start Date End Date Taking? Authorizing Provider  albuterol (PROVENTIL HFA;VENTOLIN HFA) 108 (90 Base) MCG/ACT inhaler Inhale 1-2 puffs into the lungs every 6 (six) hours as needed for wheezing or shortness of breath.    Historical Provider, MD  amLODipine (NORVASC) 5 MG tablet Take 5 mg by mouth daily.    Historical Provider, MD  atorvastatin (LIPITOR) 40 MG tablet  Take 40 mg by mouth every evening.    Historical Provider, MD  calcium acetate (PHOSLO) 667 MG capsule Take 2,001 mg by mouth 3 (three) times daily with meals.    Historical Provider, MD  carvedilol (COREG) 25 MG tablet Take 25 mg by mouth 2 (two) times daily with a meal.    Historical Provider, MD  DULoxetine (CYMBALTA) 30 MG capsule Take 30 mg by mouth daily.    Historical Provider, MD  Ferric Citrate (AURYXIA PO) Take 1 tablet by mouth daily.     Historical Provider, MD  folic acid-vitamin b complex-vitamin c-selenium-zinc (DIALYVITE) 3 MG TABS tablet Take 1 tablet by mouth daily.    Historical Provider, MD  furosemide (LASIX) 80 MG tablet Take 80 mg by mouth 2 (two) times daily.    Historical Provider, MD  gabapentin (NEURONTIN) 300 MG capsule Take 300 mg by mouth at bedtime.    Historical Provider, MD  lisinopril (PRINIVIL,ZESTRIL) 20 MG tablet Take 20 mg by mouth every evening.    Historical Provider, MD  risperiDONE (RISPERDAL) 0.5 MG tablet Take 0.5 mg by mouth at bedtime.    Historical Provider, MD   Allergies  Allergen Reactions  . Penicillins Other (See Comments)    Reaction: Unknown    FAMILY HISTORY:  family history is negative for Rheum arthritis, Osteoarthritis, Asthma, Cancer, and Diabetes.   SOCIAL HISTORY:  reports that he has been smoking.  He does not have any smokeless tobacco history on file. He reports that he does not drink alcohol or use illicit drugs.  REVIEW OF  SYSTEMS:  Unable to complete as patient is on mechanical ventilation.   SUBJECTIVE:   VITAL SIGNS: 99.4, 105 (ST), 27, 170/95, 98%  PHYSICAL EXAMINATION: General:  Chronically ill appearing male in NAD on vent  Neuro:  Awake, alert, follows simple commands  HEENT:  ETT, MM pink/moist, no jvd  Cardiovascular:  s1s2 rrr, no 2-3/6 SEM  Lungs:  Even/non-labored, lungs bilaterally coarse Abdomen:  Soft, non-tender, BSx4 active, Tolerating TF Musculoskeletal:  No acute deformities  Skin:  Warm/dry, no  edema, multiple scattered scars on BLE's    Recent Labs Lab 02/02/16 0650 02/03/16 0601 02/04/16 1327  NA 137 136 136  K 4.4 5.9* 3.8  CL 95* 93* 97*  CO2 29 20* 27  BUN 34* 61* 68*  CREATININE 2.90* 4.21* 3.32*  GLUCOSE 97 380* 202*    Recent Labs Lab 02/02/16 0650 02/03/16 0600 02/04/16 1327  HGB 10.4* 11.4* 10.6*  HCT 35.1* 37.7* 34.5*  WBC 15.9* 19.1* 23.2*  PLT 187 271 254   Dg Chest Port 1 View  02/04/2016  CLINICAL DATA:  Followup pneumonia EXAM: PORTABLE CHEST 1 VIEW COMPARISON:  02/02/2016 FINDINGS: Cardiac shadow is stable. Endotracheal tube, nasogastric catheter and right jugular line are again seen and stable. Bibasilar infiltrates as well as small effusions are again seen and stable. No new focal abnormality is noted. IMPRESSION: No change from the prior exam.  Stable bibasilar infiltrates. Electronically Signed   By: Alcide Clever M.D.   On: 02/04/2016 14:08   SIGNIFICANT EVENTS  6/21 - 6/29  Admit to [facility] for acute respiratory failure secondary to volume overload 6/29  Transfer to [facility]  7/03  PCCM consulted for evaluation of resp fx  STUDIES:  7/02  CXR >> stable bibasilar infiltrates  ANTIBIOTICS: Vanco 7/2 >>  Cefepime 7/2 >>  Diflucan 7/2 >>   CULTURES:  UC 7/2 >>  BCx2 7/2 >>  Sputum 7/2 >>    ASSESSMENT / PLAN:  Discussion:  40 y/o M with PMH of HTN, DM II, ESRD and medical non-compliance with recent admit 6/21-6/29 for H. Flu PNA, treated with rocephin, R pleural effusion s/p thora and respiratory failure requiring intubation (6/23).  Prolonged respiratory failure with poor weaning thought related to HCAP + volume overload.    Acute Respiratory Failure - secondary to HCAP (H. Flu) & volume overoad H. Influenza PNA / HCAP Volume Overload  COPD  Right Pleural Effusion - s/p thora 1L removed, likely exudative by protein ESRD  HTN   Plan: Continue ventilator wean protocol  ABX per primary MD  Negative balance as able with HD    Intermittent CXR, follow up 7/5 BP control  Trend PCT Push PT efforts Follow cultures as above Wean steroids to off    Canary Brim, NP-C [facility] Pgr: [phone] or if no answer [phone] 02/05/2016, 2:02 PM   Attending:  I have seen and examined the patient with nurse practitioner/resident and agree with the note above.  We formulated the plan together and I elicited the following history.    Tanner Campbell has a lengthy past medical history as detailed above and was admitted about 10 days ago for pneumonia and likely volume overload.  He has failed to wean from the vent thus far.  On exam Awake, alert, following commands Lungs: diminished bases, few crackles, vent supported breaths CV: loud murmur loudest RUSB 5/6 Belly: soft, nontender  CXR images reviewed: bilateral pleural effusions R>L, improved compared to admission after thoracentesis  but persistent lower lobe airspace disease  Acute respiratory failure with hypoxemia> seems to be improving based on today's pressure support trial, I'm hoping that after another dialysis session with volume removal we can extubate him on 7/5, would continue weaning efforts as you are doing HCAP> continue antibiotics, f/u culture ESRD> per renal, would encourage as much volume removal as tolerated Heart murmur> he denies ever hearing about this, no echo on record, will order echo  Will follow  Heber [place], MD [place] [facility] Pager: [phone] Cell: [phone] After 3pm or if no response, call [phone]

## 2016-02-06 ENCOUNTER — Other Ambulatory Visit (HOSPITAL_COMMUNITY): Payer: Medicare Other

## 2016-02-06 LAB — BLOOD GAS, ARTERIAL
ACID-BASE EXCESS: 3.7 mmol/L — AB (ref 0.0–2.0)
BICARBONATE: 28 meq/L — AB (ref 20.0–24.0)
FIO2: 0.28
O2 SAT: 96.9 %
PATIENT TEMPERATURE: 100.1
PCO2 ART: 46 mmHg — AB (ref 35.0–45.0)
PEEP: 5 cmH2O
PH ART: 7.405 (ref 7.350–7.450)
PO2 ART: 95.8 mmHg (ref 80.0–100.0)
PRESSURE SUPPORT: 5 cmH2O
TCO2: 29.3 mmol/L (ref 0–100)

## 2016-02-06 LAB — CULTURE, RESPIRATORY W GRAM STAIN

## 2016-02-06 LAB — PROCALCITONIN: Procalcitonin: 3.1 ng/mL

## 2016-02-06 LAB — CULTURE, RESPIRATORY: CULTURE: NORMAL

## 2016-02-06 LAB — VANCOMYCIN, TROUGH: Vancomycin Tr: 11 ug/mL — ABNORMAL LOW (ref 15–20)

## 2016-02-07 ENCOUNTER — Other Ambulatory Visit (HOSPITAL_COMMUNITY): Payer: Medicare Other

## 2016-02-07 ENCOUNTER — Other Ambulatory Visit (HOSPITAL_BASED_OUTPATIENT_CLINIC_OR_DEPARTMENT_OTHER): Payer: Medicare Other

## 2016-02-07 DIAGNOSIS — J9601 Acute respiratory failure with hypoxia: Secondary | ICD-10-CM | POA: Diagnosis not present

## 2016-02-07 DIAGNOSIS — J810 Acute pulmonary edema: Secondary | ICD-10-CM | POA: Diagnosis not present

## 2016-02-07 DIAGNOSIS — J189 Pneumonia, unspecified organism: Secondary | ICD-10-CM | POA: Diagnosis not present

## 2016-02-07 DIAGNOSIS — J441 Chronic obstructive pulmonary disease with (acute) exacerbation: Secondary | ICD-10-CM | POA: Diagnosis not present

## 2016-02-07 DIAGNOSIS — R011 Cardiac murmur, unspecified: Secondary | ICD-10-CM

## 2016-02-07 LAB — RENAL FUNCTION PANEL
Albumin: 1.9 g/dL — ABNORMAL LOW (ref 3.5–5.0)
Anion gap: 14 (ref 5–15)
BUN: 93 mg/dL — AB (ref 6–20)
CALCIUM: 8.6 mg/dL — AB (ref 8.9–10.3)
CHLORIDE: 96 mmol/L — AB (ref 101–111)
CO2: 26 mmol/L (ref 22–32)
CREATININE: 3.89 mg/dL — AB (ref 0.61–1.24)
GFR, EST AFRICAN AMERICAN: 21 mL/min — AB (ref >60)
GFR, EST NON AFRICAN AMERICAN: 18 mL/min — AB (ref >60)
Glucose, Bld: 273 mg/dL — ABNORMAL HIGH (ref 65–99)
Phosphorus: 5.7 mg/dL — ABNORMAL HIGH (ref 2.5–4.6)
Potassium: 3.5 mmol/L (ref 3.5–5.1)
SODIUM: 136 mmol/L (ref 135–145)

## 2016-02-07 LAB — CBC
HCT: 35 % — ABNORMAL LOW (ref 39.0–52.0)
Hemoglobin: 10.6 g/dL — ABNORMAL LOW (ref 13.0–17.0)
MCH: 27.2 pg (ref 26.0–34.0)
MCHC: 30.3 g/dL (ref 30.0–36.0)
MCV: 90 fL (ref 78.0–100.0)
PLATELETS: 210 10*3/uL (ref 150–400)
RBC: 3.89 MIL/uL — AB (ref 4.22–5.81)
RDW: 15.9 % — AB (ref 11.5–15.5)
WBC: 17.7 10*3/uL — AB (ref 4.0–10.5)

## 2016-02-07 LAB — BASIC METABOLIC PANEL
Anion gap: 14 (ref 5–15)
BUN: 93 mg/dL — AB (ref 6–20)
CHLORIDE: 97 mmol/L — AB (ref 101–111)
CO2: 26 mmol/L (ref 22–32)
CREATININE: 3.97 mg/dL — AB (ref 0.61–1.24)
Calcium: 8.6 mg/dL — ABNORMAL LOW (ref 8.9–10.3)
GFR calc Af Amer: 20 mL/min — ABNORMAL LOW (ref >60)
GFR, EST NON AFRICAN AMERICAN: 17 mL/min — AB (ref >60)
GLUCOSE: 276 mg/dL — AB (ref 65–99)
POTASSIUM: 3.6 mmol/L (ref 3.5–5.1)
Sodium: 137 mmol/L (ref 135–145)

## 2016-02-07 LAB — ECHOCARDIOGRAM COMPLETE
AVPHT: 222 ms
EWDT: 155 ms
FS: 21 % — AB (ref 28–44)
IV/PV OW: 1
LA diam end sys: 47 mm
LA vol: 79.7 mL
LADIAMINDEX: 2.68 cm/m2
LASIZE: 47 mm
LAVOLA4C: 74.5 mL
LAVOLIN: 45.4 mL/m2
LV TDI E'MEDIAL: 4.9
LVELAT: 8.16 cm/s
LVOT area: 3.14 cm2
LVOTD: 20 mm
MV Dec: 155
MV pk E vel: 1.5 m/s
PW: 11 mm — AB (ref 0.6–1.1)
TAPSE: 16.5 mm
TDI e' lateral: 8.16
WEIGHTICAEL: 2342.17 [oz_av]

## 2016-02-07 LAB — PROCALCITONIN: PROCALCITONIN: 2.09 ng/mL

## 2016-02-07 LAB — PH, BODY FLUID: PH, BODY FLUID: 7.7

## 2016-02-07 NOTE — Progress Notes (Addendum)
Name: Tanner Campbell MRN: [id number] DOB: [birth year]    ADMISSION DATE:  02/01/2016 CONSULTATION DATE:  02/05/16  REFERRING MD :  Dr. Sharyon Medicus / [facility]   CHIEF COMPLAINT:  Acute Respiratory Failure   HISTORY OF PRESENT ILLNESS:  40 y/o M with PMH of DM II, HTN, HLD, ESRD on HD (T, Th, S), COPD and prior back surgery who initially was admitted to [facility] from 6/21 - 6/29 for acute respiratory failure in the setting of volume overload.  He was admitted by the hospitalist service for further evaluation.  He was treated with BiPAP on admit.  Unfortunately, he decompensated 6/23 and required intubation.  The patient was found to have H. Influenza positive sputum and treated with rocephin.  CXR demonstrated a large right pleural effusion and underwent a thoracentesis (1L serous fluid removed, likely exudative by protein). He had difficulty with weaning and was transferred to [facility] on 6/29 for further evaluation / ventilator weaning.  The patient developed fever and antibiotics were expanded to vancomycin, cefepime and diflucan.  He has progressed to weaning on PSV x 4 hours as of 7/3.    SUBJECTIVE: Self extubated overnight, no acute complaints.  VITAL SIGNS: 98.6, 85, 16, 135/85, 98% on 2L [place]  PHYSICAL EXAMINATION: General:  Chronically ill appearing male in NAD on vent  Neuro:  Awake, alert, follows simple commands  HEENT:  ETT, MM pink/moist, no jvd  Cardiovascular:  s1s2 rrr, no 2-3/6 SEM  Lungs:  Even/non-labored, lungs bilaterally coarse Abdomen:  Soft, non-tender, BSx4 active, Tolerating TF Musculoskeletal:  No acute deformities  Skin:  Warm/dry, no edema, multiple scattered scars on BLE's    Recent Labs Lab 02/03/16 0601 02/04/16 1327 02/07/16 0545  NA 136 136 136  137  K 5.9* 3.8 3.5  3.6  CL 93* 97* 96*  97*  CO2 20* BUN 61* 68* 93*  93*  CREATININE 4.21* 3.32* 3.89*  3.97*  GLUCOSE 380* 202* 273*  276*    Recent Labs Lab 02/03/16 0600 02/04/16 1327  02/07/16 0545  HGB 11.4* 10.6* 10.6*  HCT 37.7* 34.5* 35.0*  WBC 19.1* 23.2* 17.7*  PLT 271 254 210   Dg Chest Port 1 View  02/07/2016  CLINICAL DATA:  Respiratory failure, COPD, end-stage renal disease, pulmonary edema. EXAM: PORTABLE CHEST 1 VIEW COMPARISON:  Portable chest x-ray of February 04, 2016 FINDINGS: The lungs are reasonably well inflated. There is small bilateral pleural effusions. There is bibasilar atelectasis or pneumonia. The interstitial markings are increased diffusely. The cardiac silhouette remains enlarged. The endotracheal tube is not clearly evident. The esophagogastric tube tip projects below the inferior margin of the image. The right internal jugular venous catheter tip projects over the distal third of the SVC. IMPRESSION: CHF with bibasilar atelectasis or pneumonia and small bilateral pleural effusion. There has not been dramatic interval change since yesterday's study. Interval extubation of the trachea. Electronically Signed   By: David  [place] M.D.   On: 02/07/2016 07:16   Dg Abd Portable 1v  02/07/2016  CLINICAL DATA:  Nasogastric tube placement.  Initial encounter. EXAM: PORTABLE ABDOMEN - 1 VIEW COMPARISON:  Abdominal radiograph performed 02/02/2016 FINDINGS: The patient's enteric tube is noted ending overlying the body of the stomach. The visualized bowel gas pattern is unremarkable. Scattered air and stool filled loops of colon are seen; no abnormal dilatation of small bowel loops is seen to suggest small bowel obstruction. No free intra-abdominal air is identified, though evaluation for free  air is limited on a single supine view. Clips are noted within the right upper quadrant, reflecting prior cholecystectomy. The visualized osseous structures are within normal limits; the sacroiliac joints are unremarkable in appearance. Small bilateral pleural effusions are again noted. IMPRESSION: Enteric tube noted ending overlying the body of the stomach. Electronically Signed   By:  Roanna Raider M.D.   On: 02/07/2016 06:29   SIGNIFICANT EVENTS  6/21 - 6/29  Admit to [facility] for acute respiratory failure secondary to volume overload 6/29  Transfer to [facility]  7/03  PCCM consulted for evaluation of resp fx  STUDIES:  7/02  CXR >> stable bibasilar infiltrates  ANTIBIOTICS: Vanco 7/2 >>  Cefepime 7/2 >>  Diflucan 7/2 >>   CULTURES:  UC 7/2 >>  BCx2 7/2 >>  Sputum 7/2 >>   I reviewed CXR myself, infiltrate noted.  ASSESSMENT / PLAN:  Discussion:  40 y/o M with PMH of HTN, DM II, ESRD and medical non-compliance with recent admit 6/21-6/29 for H. Flu PNA, treated with rocephin, R pleural effusion s/p thora and respiratory failure requiring intubation (6/23).  Prolonged respiratory failure with poor weaning thought related to HCAP + volume overload.    Acute Respiratory Failure - secondary to HCAP (H. Flu) & volume overoad H. Influenza PNA / HCAP Volume Overload  COPD  Right Pleural Effusion - s/p thora 1L removed, likely exudative by protein ESRD  HTN   Plan: Titrate O2 for sat of 88-92%. ABX noted, continue for now.  Negative balance as able with HD, currently undergoing HD. Intermittent CXR, follow up 7/5 Push PT efforts Follow cultures and sensitivities.  D/C steroids. Bronchodilators as ordered.  PCCM will sign off, please call back if needed.  Discussed with [facility].  Alyson Reedy, M.D. [facility] Pulmonary/Critical Care Medicine. Pager: [phone]. After hours pager: [phone].

## 2016-02-07 NOTE — Progress Notes (Signed)
  Echocardiogram 2D Echocardiogram has been performed.  Delcie Roch 02/07/2016, 1:08 PM

## 2016-02-07 NOTE — Progress Notes (Signed)
[facility]  ROUNDING NOTE   Subjective:  Self extubated today. He is arousable and does follow simple commands. Realizes that he is in the hospital. Patient completed dialysis today, ultrafiltration achieved was 1.5 kg.   Objective:  Vital signs in last 24 hours:  Temperature 98.4 pulse 92 respirations 22 blood pressure 163/78     Physical Exam: General: Critically ill appearing  Head: [place]/AT hearing intact, NG in place  Eyes: Anicteric  Neck: Supple, trachea midline  Lungs:  Scattered rhonchi, normal effort  Heart: Tachycardic, 3/6 systolic ejection murmur  Abdomen:  Soft, nontender. BS present  Extremities: No peripheral edema.  Neurologic: Arousable, follows simple commands  Skin: No lesions  Access: Left arm AVF good thrill    Basic Metabolic Panel:  Recent Labs Lab 02/01/16 0525 02/02/16 0650  02/03/16 0601 02/04/16 1327 02/07/16 0545  NA 137 137  --  136 136 136  137  K 5.7* 4.4  --  5.9* 3.8 3.5  3.6  CL 96* 95*  --  93* 97* 96*  97*  CO2 28 29  --  20* 27 26  26   GLUCOSE 122* 97  --  380* 202* 273*  276*  BUN 67* 34*  --  61* 68* 93*  93*  CREATININE 3.78* 2.90*  --  4.21* 3.32* 3.89*  3.97*  CALCIUM 8.4* 8.5*  < > 8.8* 8.7* 8.6*  8.6*  MG  --  1.9  --   --   --   --   PHOS  --  3.2  --  7.4*  --  5.7*  < > = values in this interval not displayed.  Liver Function Tests:  Recent Labs Lab 02/02/16 0650 02/03/16 0601 02/07/16 0545  AST 18  --   --   ALT 16*  --   --   ALKPHOS 227*  --   --   BILITOT 0.9  --   --   PROT 5.7*  --   --   ALBUMIN 1.6* 1.8* 1.9*   No results for input(s): LIPASE, AMYLASE in the last 168 hours. No results for input(s): AMMONIA in the last 168 hours.  CBC:  Recent Labs Lab 02/01/16 0525 02/02/16 0650 02/03/16 0600 02/04/16 1327 02/07/16 0545  WBC 18.0* 15.9* 19.1* 23.2* 17.7*  NEUTROABS 15.8* 12.9*  --   --   --   HGB 10.4* 10.4* 11.4* 10.6* 10.6*  HCT 32.5* 35.1* 37.7* 34.5* 35.0*   MCV 85.6 89.8 89.8 88.5 90.0  PLT 168 187 271 254 210    Cardiac Enzymes: No results for input(s): CKTOTAL, CKMB, CKMBINDEX, TROPONINI in the last 168 hours.  BNP: Invalid input(s): POCBNP  CBG:  Recent Labs Lab 02/01/16 1647 02/01/16 1728 02/01/16 1850 02/01/16 1950 02/01/16 2019  GLUCAP 56* 91 73 63* 175*    Microbiology: Results for orders placed or performed during the hospital encounter of 02/01/16  Culture, blood (routine x 2)     Status: None (Preliminary result)   Collection Time: 02/04/16  2:00 PM  Result Value Ref Range Status   Specimen Description BLOOD PICC LINE  Final   Special Requests BOTTLES DRAWN AEROBIC AND ANAEROBIC 10CC  Final   Culture NO GROWTH 2 DAYS  Final   Report Status PENDING  Incomplete  Culture, blood (routine x 2)     Status: None (Preliminary result)   Collection Time: 02/04/16  2:17 PM  Result Value Ref Range Status   Specimen Description BLOOD BLOOD RIGHT HAND  Final   Special Requests BOTTLES DRAWN AEROBIC AND ANAEROBIC 5CC  Final   Culture NO GROWTH 2 DAYS  Final   Report Status PENDING  Incomplete  Culture, Urine     Status: None   Collection Time: 02/04/16  2:28 PM  Result Value Ref Range Status   Specimen Description URINE, RANDOM  Final   Special Requests NONE  Final   Culture NO GROWTH  Final   Report Status 02/05/2016 FINAL  Final  Culture, respiratory (NON-Expectorated)     Status: None   Collection Time: 02/04/16  3:00 PM  Result Value Ref Range Status   Specimen Description TRACHEAL ASPIRATE  Final   Special Requests NONE  Final   Gram Stain   Final    MODERATE WBC PRESENT, PREDOMINANTLY PMN RARE SQUAMOUS EPITHELIAL CELLS PRESENT FEW GRAM POSITIVE RODS RARE GRAM POSITIVE COCCI IN PAIRS RARE GRAM NEGATIVE RODS    Culture Consistent with normal respiratory flora.  Final   Report Status 02/06/2016 FINAL  Final    Coagulation Studies: No results for input(s): LABPROT, INR in the last 72 hours.  Urinalysis: No  results for input(s): COLORURINE, LABSPEC, PHURINE, GLUCOSEU, HGBUR, BILIRUBINUR, KETONESUR, PROTEINUR, UROBILINOGEN, NITRITE, LEUKOCYTESUR in the last 72 hours.  Invalid input(s): APPERANCEUR    Imaging: Dg Chest Port 1 View  02/07/2016  CLINICAL DATA:  Respiratory failure, COPD, end-stage renal disease, pulmonary edema. EXAM: PORTABLE CHEST 1 VIEW COMPARISON:  Portable chest x-ray of February 04, 2016 FINDINGS: The lungs are reasonably well inflated. There is small bilateral pleural effusions. There is bibasilar atelectasis or pneumonia. The interstitial markings are increased diffusely. The cardiac silhouette remains enlarged. The endotracheal tube is not clearly evident. The esophagogastric tube tip projects below the inferior margin of the image. The right internal jugular venous catheter tip projects over the distal third of the SVC. IMPRESSION: CHF with bibasilar atelectasis or pneumonia and small bilateral pleural effusion. There has not been dramatic interval change since yesterday's study. Interval extubation of the trachea. Electronically Signed   By: David  [place] M.D.   On: 02/07/2016 07:16   Dg Abd Portable 1v  02/07/2016  CLINICAL DATA:  Nasogastric tube placement.  Initial encounter. EXAM: PORTABLE ABDOMEN - 1 VIEW COMPARISON:  Abdominal radiograph performed 02/02/2016 FINDINGS: The patient's enteric tube is noted ending overlying the body of the stomach. The visualized bowel gas pattern is unremarkable. Scattered air and stool filled loops of colon are seen; no abnormal dilatation of small bowel loops is seen to suggest small bowel obstruction. No free intra-abdominal air is identified, though evaluation for free air is limited on a single supine view. Clips are noted within the right upper quadrant, reflecting prior cholecystectomy. The visualized osseous structures are within normal limits; the sacroiliac joints are unremarkable in appearance. Small bilateral pleural effusions are again noted.  IMPRESSION: Enteric tube noted ending overlying the body of the stomach. Electronically Signed   By: Roanna Raider M.D.   On: 02/07/2016 06:29     Medications:       Assessment/ Plan:  [name] is a 40 y.o. white male with End-stage renal disease with hemodialysis Started hemodialysis May 02, 2014, Hypertension, Diabetes mellitus type 2, hyperlipidemia, Multiple sclerosis, COPD, Back surgery, Cholecystectomy, Tobacco use, acute respiratory failure 01/2016 originally at [facility] then transitioned to [facility]  TTS [facility]. left arm AVF  1. End Stage Renal Disease: with pulmonary edema and hyperkalemia.  - patient complaint hemodialysis today. Ultrafiltration achieved  was 1.5 kg. Next ulcer is on Friday.  2. Acute resp failure - multifactorial - pulm edema, possible underlying pneumonia, COPD exacerbation - self extubated 02/07/2016 - Continue to monitor respiratory status closely.  3. Anemia of chronic kidney disease: consider Aranesp if hemoglobin falls to 10 or below.  4. Secondary hyperparathyroidism. Phosphorus down to 5.7. Continue to monitor.    LOS:  Tanner Campbell 7/5/20173:56 PM

## 2016-02-08 LAB — PROCALCITONIN: Procalcitonin: 1.59 ng/mL

## 2016-02-09 ENCOUNTER — Other Ambulatory Visit (HOSPITAL_COMMUNITY): Payer: Medicare Other

## 2016-02-09 LAB — BLOOD GAS, ARTERIAL
Acid-base deficit: 0.7 mmol/L (ref 0.0–2.0)
Bicarbonate: 23.5 mEq/L (ref 20.0–24.0)
O2 Content: 3 L/min
O2 SAT: 94.5 %
PH ART: 7.399 (ref 7.350–7.450)
Patient temperature: 98.3
TCO2: 24.7 mmol/L (ref 0–100)
pCO2 arterial: 38.7 mmHg (ref 35.0–45.0)
pO2, Arterial: 77.2 mmHg — ABNORMAL LOW (ref 80.0–100.0)

## 2016-02-09 LAB — CBC
HCT: 38.6 % — ABNORMAL LOW (ref 39.0–52.0)
HEMOGLOBIN: 12.2 g/dL — AB (ref 13.0–17.0)
MCH: 27.2 pg (ref 26.0–34.0)
MCHC: 31.6 g/dL (ref 30.0–36.0)
MCV: 86 fL (ref 78.0–100.0)
PLATELETS: 308 10*3/uL (ref 150–400)
RBC: 4.49 MIL/uL (ref 4.22–5.81)
RDW: 16.1 % — ABNORMAL HIGH (ref 11.5–15.5)
WBC: 34.8 10*3/uL — ABNORMAL HIGH (ref 4.0–10.5)

## 2016-02-09 LAB — RENAL FUNCTION PANEL
ALBUMIN: 2.2 g/dL — AB (ref 3.5–5.0)
ANION GAP: 15 (ref 5–15)
BUN: 102 mg/dL — ABNORMAL HIGH (ref 6–20)
CALCIUM: 8.7 mg/dL — AB (ref 8.9–10.3)
CO2: 25 mmol/L (ref 22–32)
CREATININE: 4.01 mg/dL — AB (ref 0.61–1.24)
Chloride: 97 mmol/L — ABNORMAL LOW (ref 101–111)
GFR calc non Af Amer: 17 mL/min — ABNORMAL LOW (ref >60)
GFR, EST AFRICAN AMERICAN: 20 mL/min — AB (ref >60)
GLUCOSE: 35 mg/dL — AB (ref 65–99)
PHOSPHORUS: 5 mg/dL — AB (ref 2.5–4.6)
Potassium: 4.1 mmol/L (ref 3.5–5.1)
SODIUM: 137 mmol/L (ref 135–145)

## 2016-02-09 LAB — TROPONIN I
TROPONIN I: 0.25 ng/mL — AB (ref <0.03)
TROPONIN I: 0.3 ng/mL — AB (ref <0.03)

## 2016-02-09 LAB — CK TOTAL AND CKMB (NOT AT ARMC)
CK TOTAL: 72 U/L (ref 49–397)
CK, MB: 11.4 ng/mL — ABNORMAL HIGH (ref 0.5–5.0)
RELATIVE INDEX: INVALID (ref 0.0–2.5)

## 2016-02-09 NOTE — Progress Notes (Signed)
[facility]  ROUNDING NOTE   Subjective:  Patient seen and evaluated during dialysis. Ultrafiltration target increased to 2.5 kg. Currently on BiPAP.   Objective:  Vital signs in last 24 hours:  Temperature 97.5 pulse 86 respirations 16 blood pressure 135/81 pulse ox 100% on BiPAP     Physical Exam: General: Critically ill appearing  Head: [place]/AT hearing intact, bipap face mask on  Eyes: Anicteric  Neck: Supple, trachea midline  Lungs:  Scattered rhonchi, normal effort  Heart: S1S2, 3/6 systolic ejection murmur  Abdomen:  Soft, nontender. BS present  Extremities: No peripheral edema.  Neurologic: Awake, follows simple commands  Skin: No lesions  Access: Left arm AVF good thrill    Basic Metabolic Panel:  Recent Labs Lab 02/03/16 0601 02/04/16 1327 02/07/16 0545 02/09/16 0638  NA 136 136 136  137 137  K 5.9* 3.8 3.5  3.6 4.1  CL 93* 97* 96*  97* 97*  CO2 20* GLUCOSE 380* 202* 273*  276* 35*  BUN 61* 68* 93*  93* 102*  CREATININE 4.21* 3.32* 3.89*  3.97* 4.01*  CALCIUM 8.8* 8.7* 8.6*  8.6* 8.7*  PHOS 7.4*  --  5.7* 5.0*    Liver Function Tests:  Recent Labs Lab 02/03/16 0601 02/07/16 0545 02/09/16 0638  ALBUMIN 1.8* 1.9* 2.2*   No results for input(s): LIPASE, AMYLASE in the last 168 hours. No results for input(s): AMMONIA in the last 168 hours.  CBC:  Recent Labs Lab 02/03/16 0600 02/04/16 1327 02/07/16 0545 02/09/16 0638  WBC 19.1* 23.2* 17.7* 34.8*  HGB 11.4* 10.6* 10.6* 12.2*  HCT 37.7* 34.5* 35.0* 38.6*  MCV 89.8 88.5 90.0 86.0  PLT 271 254 210 308    Cardiac Enzymes: No results for input(s): CKTOTAL, CKMB, CKMBINDEX, TROPONINI in the last 168 hours.  BNP: Invalid input(s): POCBNP  CBG: No results for input(s): GLUCAP in the last 168 hours.  Microbiology: Results for orders placed or performed during the hospital encounter of 02/01/16  Culture, blood (routine x 2)     Status: None (Preliminary  result)   Collection Time: 02/04/16  2:00 PM  Result Value Ref Range Status   Specimen Description BLOOD PICC LINE  Final   Special Requests BOTTLES DRAWN AEROBIC AND ANAEROBIC 10CC  Final   Culture NO GROWTH 4 DAYS  Final   Report Status PENDING  Incomplete  Culture, blood (routine x 2)     Status: None (Preliminary result)   Collection Time: 02/04/16  2:17 PM  Result Value Ref Range Status   Specimen Description BLOOD BLOOD RIGHT HAND  Final   Special Requests BOTTLES DRAWN AEROBIC AND ANAEROBIC 5CC  Final   Culture NO GROWTH 4 DAYS  Final   Report Status PENDING  Incomplete  Culture, Urine     Status: None   Collection Time: 02/04/16  2:28 PM  Result Value Ref Range Status   Specimen Description URINE, RANDOM  Final   Special Requests NONE  Final   Culture NO GROWTH  Final   Report Status 02/05/2016 FINAL  Final  Culture, respiratory (NON-Expectorated)     Status: None   Collection Time: 02/04/16  3:00 PM  Result Value Ref Range Status   Specimen Description TRACHEAL ASPIRATE  Final   Special Requests NONE  Final   Gram Stain   Final    MODERATE WBC PRESENT, PREDOMINANTLY PMN RARE SQUAMOUS EPITHELIAL CELLS PRESENT FEW GRAM POSITIVE RODS RARE GRAM POSITIVE COCCI IN  PAIRS RARE GRAM NEGATIVE RODS    Culture Consistent with normal respiratory flora.  Final   Report Status 02/06/2016 FINAL  Final    Coagulation Studies: No results for input(s): LABPROT, INR in the last 72 hours.  Urinalysis: No results for input(s): COLORURINE, LABSPEC, PHURINE, GLUCOSEU, HGBUR, BILIRUBINUR, KETONESUR, PROTEINUR, UROBILINOGEN, NITRITE, LEUKOCYTESUR in the last 72 hours.  Invalid input(s): APPERANCEUR    Imaging: Dg Chest Port 1 View  02/07/2016  CLINICAL DATA:  Central line placement EXAM: PORTABLE CHEST 1 VIEW COMPARISON:  Portable exam 1711 hours compared to 02/07/2016 at 0602 hours FINDINGS: Nasogastric tube extends into stomach. New LEFT jugular central venous catheter with tip  projecting over SVC. Previously identified RIGHT jugular line no longer seen. Numerous EKG leads project over chest. Enlargement of cardiac silhouette with minimal vascular congestion. Bibasilar pulmonary infiltrates and pleural effusions identified. No pneumothorax. Prior cervical spine fusion. IMPRESSION: Enlargement of cardiac silhouette. Bibasilar pleural effusions and atelectasis. No pneumothorax following LEFT jugular line placement. Electronically Signed   By: Ulyses Southward M.D.   On: 02/07/2016 17:25     Medications:       Assessment/ Plan:  [name] is a 40 y.o. white male with End-stage renal disease with hemodialysis Started hemodialysis May 02, 2014, Hypertension, Diabetes mellitus type 2, hyperlipidemia, Multiple sclerosis, COPD, Back surgery, Cholecystectomy, Tobacco use, acute respiratory failure 01/2016 originally at [facility] then transitioned to [facility]  TTS [facility]. left arm AVF  1. End Stage Renal Disease: with pulmonary edema and hyperkalemia.  - patient seen and evaluated during hemodialysis today. Ultrafiltration target increased to 2.5 kg. Dialysis for Monday or as needed.  2. Acute resp failure - multifactorial - pulm edema, possible underlying pneumonia, COPD exacerbation - self extubated 02/07/2016 - currently on BiPAP. Ultrafiltration target increased to 2.5 kg.  3. Anemia of chronic kidney disease: hemoglobin up to 12. No indication for Aranesp at the moment.  4. Secondary hyperparathyroidism. Phosphorus down to 5.0. Continue monitor bone mineral metabolism parameters.    LOS:  Tanner Campbell 7/7/20178:52 AM

## 2016-02-10 ENCOUNTER — Other Ambulatory Visit (HOSPITAL_COMMUNITY): Payer: Medicare Other

## 2016-02-10 LAB — CULTURE, BLOOD (ROUTINE X 2)
CULTURE: NO GROWTH
Culture: NO GROWTH

## 2016-02-10 LAB — CBC
HCT: 32.4 % — ABNORMAL LOW (ref 39.0–52.0)
HEMOGLOBIN: 10 g/dL — AB (ref 13.0–17.0)
MCH: 26.3 pg (ref 26.0–34.0)
MCHC: 30.9 g/dL (ref 30.0–36.0)
MCV: 85.3 fL (ref 78.0–100.0)
Platelets: 233 10*3/uL (ref 150–400)
RBC: 3.8 MIL/uL — ABNORMAL LOW (ref 4.22–5.81)
RDW: 16.3 % — AB (ref 11.5–15.5)
WBC: 15.6 10*3/uL — AB (ref 4.0–10.5)

## 2016-02-10 LAB — CK TOTAL AND CKMB (NOT AT ARMC)
CK TOTAL: 28 U/L — AB (ref 49–397)
CK, MB: 8.5 ng/mL — AB (ref 0.5–5.0)
RELATIVE INDEX: INVALID (ref 0.0–2.5)

## 2016-02-10 LAB — TROPONIN I
TROPONIN I: 0.19 ng/mL — AB (ref <0.03)
Troponin I: 0.27 ng/mL (ref <0.03)

## 2016-02-11 ENCOUNTER — Other Ambulatory Visit (HOSPITAL_COMMUNITY): Payer: Medicare Other

## 2016-02-11 LAB — COMPREHENSIVE METABOLIC PANEL
ALBUMIN: 2.1 g/dL — AB (ref 3.5–5.0)
ALT: 50 U/L (ref 17–63)
ANION GAP: 15 (ref 5–15)
AST: 42 U/L — ABNORMAL HIGH (ref 15–41)
Alkaline Phosphatase: 281 U/L — ABNORMAL HIGH (ref 38–126)
BUN: 122 mg/dL — ABNORMAL HIGH (ref 6–20)
CHLORIDE: 95 mmol/L — AB (ref 101–111)
CO2: 23 mmol/L (ref 22–32)
Calcium: 8.3 mg/dL — ABNORMAL LOW (ref 8.9–10.3)
Creatinine, Ser: 4.9 mg/dL — ABNORMAL HIGH (ref 0.61–1.24)
GFR calc non Af Amer: 13 mL/min — ABNORMAL LOW (ref >60)
GFR, EST AFRICAN AMERICAN: 16 mL/min — AB (ref >60)
GLUCOSE: 281 mg/dL — AB (ref 65–99)
POTASSIUM: 5.1 mmol/L (ref 3.5–5.1)
SODIUM: 133 mmol/L — AB (ref 135–145)
Total Bilirubin: 1.4 mg/dL — ABNORMAL HIGH (ref 0.3–1.2)
Total Protein: 5.2 g/dL — ABNORMAL LOW (ref 6.5–8.1)

## 2016-02-11 LAB — CBC WITH DIFFERENTIAL/PLATELET
BASOS PCT: 0 %
Basophils Absolute: 0 10*3/uL (ref 0.0–0.1)
EOS ABS: 0 10*3/uL (ref 0.0–0.7)
Eosinophils Relative: 0 %
HCT: 32.7 % — ABNORMAL LOW (ref 39.0–52.0)
HEMOGLOBIN: 10.2 g/dL — AB (ref 13.0–17.0)
Lymphocytes Relative: 3 %
Lymphs Abs: 0.6 10*3/uL — ABNORMAL LOW (ref 0.7–4.0)
MCH: 27.2 pg (ref 26.0–34.0)
MCHC: 31.2 g/dL (ref 30.0–36.0)
MCV: 87.2 fL (ref 78.0–100.0)
MONOS PCT: 2 %
Monocytes Absolute: 0.4 10*3/uL (ref 0.1–1.0)
NEUTROS ABS: 17.9 10*3/uL — AB (ref 1.7–7.7)
NEUTROS PCT: 95 %
PLATELETS: 404 10*3/uL — AB (ref 150–400)
RBC: 3.75 MIL/uL — ABNORMAL LOW (ref 4.22–5.81)
RDW: 16.5 % — ABNORMAL HIGH (ref 11.5–15.5)
WBC: 18.9 10*3/uL — AB (ref 4.0–10.5)

## 2016-02-12 ENCOUNTER — Other Ambulatory Visit (HOSPITAL_COMMUNITY): Payer: Medicare Other

## 2016-02-12 DIAGNOSIS — J189 Pneumonia, unspecified organism: Secondary | ICD-10-CM | POA: Diagnosis not present

## 2016-02-12 DIAGNOSIS — N189 Chronic kidney disease, unspecified: Secondary | ICD-10-CM

## 2016-02-12 DIAGNOSIS — J9601 Acute respiratory failure with hypoxia: Secondary | ICD-10-CM | POA: Diagnosis not present

## 2016-02-12 DIAGNOSIS — R7989 Other specified abnormal findings of blood chemistry: Secondary | ICD-10-CM

## 2016-02-12 DIAGNOSIS — I10 Essential (primary) hypertension: Secondary | ICD-10-CM

## 2016-02-12 DIAGNOSIS — I509 Heart failure, unspecified: Secondary | ICD-10-CM | POA: Insufficient documentation

## 2016-02-12 DIAGNOSIS — N179 Acute kidney failure, unspecified: Secondary | ICD-10-CM

## 2016-02-12 DIAGNOSIS — J9621 Acute and chronic respiratory failure with hypoxia: Secondary | ICD-10-CM

## 2016-02-12 DIAGNOSIS — R778 Other specified abnormalities of plasma proteins: Secondary | ICD-10-CM | POA: Diagnosis present

## 2016-02-12 DIAGNOSIS — I5021 Acute systolic (congestive) heart failure: Secondary | ICD-10-CM

## 2016-02-12 DIAGNOSIS — N186 End stage renal disease: Secondary | ICD-10-CM | POA: Diagnosis not present

## 2016-02-12 DIAGNOSIS — J9622 Acute and chronic respiratory failure with hypercapnia: Secondary | ICD-10-CM

## 2016-02-12 LAB — BLOOD GAS, ARTERIAL
Acid-Base Excess: 3.5 mmol/L — ABNORMAL HIGH (ref 0.0–2.0)
Acid-Base Excess: 3.7 mmol/L — ABNORMAL HIGH (ref 0.0–2.0)
BICARBONATE: 29.2 meq/L — AB (ref 20.0–24.0)
BICARBONATE: 26.8 meq/L — AB (ref 20.0–24.0)
DRAWN BY: 242311
Delivery systems: POSITIVE
Drawn by: 30136
Expiratory PAP: 8
FIO2: 1
FIO2: 0.35
INSPIRATORY PAP: 16
LHR: 15 {breaths}/min
O2 SAT: 91.9 %
O2 Saturation: 98 %
PATIENT TEMPERATURE: 98.6
PEEP: 5 cmH2O
PO2 ART: 76.9 mmHg — AB (ref 80.0–100.0)
Patient temperature: 98.6
TCO2: 31 mmol/L (ref 0–100)
TCO2: 27.8 mmol/L (ref 0–100)
VT: 500 mL
pCO2 arterial: 59 mmHg (ref 35.0–45.0)
pCO2 arterial: 34 mmHg — ABNORMAL LOW (ref 35.0–45.0)
pH, Arterial: 7.315 — ABNORMAL LOW (ref 7.350–7.450)
pH, Arterial: 7.508 — ABNORMAL HIGH (ref 7.350–7.450)
pO2, Arterial: 104 mmHg — ABNORMAL HIGH (ref 80.0–100.0)

## 2016-02-12 LAB — RENAL FUNCTION PANEL
Albumin: 2.1 g/dL — ABNORMAL LOW (ref 3.5–5.0)
Anion gap: 18 — ABNORMAL HIGH (ref 5–15)
BUN: 155 mg/dL — ABNORMAL HIGH (ref 6–20)
CALCIUM: 8.1 mg/dL — AB (ref 8.9–10.3)
CHLORIDE: 94 mmol/L — AB (ref 101–111)
CO2: 21 mmol/L — ABNORMAL LOW (ref 22–32)
Creatinine, Ser: 5.53 mg/dL — ABNORMAL HIGH (ref 0.61–1.24)
GFR, EST AFRICAN AMERICAN: 13 mL/min — AB (ref >60)
GFR, EST NON AFRICAN AMERICAN: 12 mL/min — AB (ref >60)
Glucose, Bld: 349 mg/dL — ABNORMAL HIGH (ref 65–99)
POTASSIUM: 5 mmol/L (ref 3.5–5.1)
Phosphorus: 7.5 mg/dL — ABNORMAL HIGH (ref 2.5–4.6)
Sodium: 133 mmol/L — ABNORMAL LOW (ref 135–145)

## 2016-02-12 LAB — HEMOGLOBIN AND HEMATOCRIT, BLOOD
HEMATOCRIT: 30.7 % — AB (ref 39.0–52.0)
HEMOGLOBIN: 9.6 g/dL — AB (ref 13.0–17.0)

## 2016-02-12 LAB — TROPONIN I: Troponin I: 0.5 ng/mL (ref <0.03)

## 2016-02-12 NOTE — Progress Notes (Signed)
Name: Tanner Campbell MRN: [id number] DOB: 1976-01-19    ADMISSION DATE:  02/01/2016 CONSULTATION DATE:  02/05/16  REFERRING MD :  Dr. Sharyon Medicus / [facility]   CHIEF COMPLAINT:  Acute Respiratory Failure   HISTORY OF PRESENT ILLNESS:  40 y/o M with PMH of DM II, HTN, HLD, ESRD on HD (T, Th, S), COPD and prior back surgery who initially was admitted to [facility] from 6/21 - 6/29 for acute respiratory failure in the setting of volume overload.  He was admitted by the hospitalist service for further evaluation.  He was treated with BiPAP on admit.  Unfortunately, he decompensated 6/23 and required intubation.  The patient was found to have H. Influenza positive sputum and treated with rocephin.  CXR demonstrated a large right pleural effusion and underwent a thoracentesis (1L serous fluid removed, likely exudative by protein). He had difficulty with weaning and was transferred to [facility] on 6/29 for further evaluation / ventilator weaning.  The patient developed fever and antibiotics were expanded to vancomycin, cefepime and diflucan. He has progressed to weaning on PSV x 4 hours as of 7/3.    SUBJECTIVE: has been teetering since self extubation 7/5.SInce that time has required BiPAP off and on. More on here recently the last 24 hours. After HD this AM his mental status worsened and he became minimally responsive. CXR consistent with RLL opacification.   VITAL SIGNS: 76, 16, 115/61, 100% on 100Fio2 on vent  PHYSICAL EXAMINATION:  General:  Chronically ill appearing male in NAD on BiPAP Neuro:  Obtunded eyes open to noxious stimuli only HEENT:  ETT, MM pink/moist, no jvd  Cardiovascular:  s1s2 rrr,  3/6 SEM  Lungs:  Even/non-labored, lungs bilaterally coarse Abdomen:  Soft, non-tender, BSx4 active, Tolerating TF Musculoskeletal:  No acute deformities  Skin:  Warm/dry, no edema, multiple scattered scars on BLE's    Recent Labs Lab 02/09/16 0638 02/11/16 0600 02/12/16 0607  NA 137 133* 133*  K 4.1 5.1 5.0    CL 97* 95* 94*  CO2 25 23 21*  BUN 102* 122* 155*  CREATININE 4.01* 4.90* 5.53*  GLUCOSE 35* 281* 349*    Recent Labs Lab 02/09/16 0638 02/10/16 0430 02/11/16 0600  HGB 12.2* 10.0* 10.2*  HCT 38.6* 32.4* 32.7*  WBC 34.8* 15.6* 18.9*  PLT 308 233 404*   Dg Chest Port 1 View  02/12/2016  CLINICAL DATA:  Acute respiratory failure EXAM: PORTABLE CHEST 1 VIEW COMPARISON:  02/09/2016 FINDINGS: Cardiac shadow remains enlarged. A right jugular central line is again seen. Endotracheal tube is now noted with the tip approximately 5 cm above the carina. Nasogastric catheter is seen within the stomach. Bilateral pleural effusions are again seen. Patchy changes in the bases bilaterally although improved aeration is noted. No bony abnormality is seen. IMPRESSION: Tubes and lines as described above. Stable bilateral pleural effusions. Bibasilar infiltrates with improved aeration when compared with the prior exam. Electronically Signed   By: Alcide Clever M.D.   On: 02/12/2016 12:45   SIGNIFICANT EVENTS  6/21 - 6/29  Admit to [facility] for acute respiratory failure secondary to volume overload 6/29  Transfer to [facility]  7/03  PCCM consulted for evaluation of resp fx 7/10- intubated for progressive respiratory failure.  STUDIES:  7/02  CXR >> stable bibasilar infiltrates  ANTIBIOTICS: Vanco 7/2 >>  Cefepime 7/2 >>  Diflucan 7/2 >>   CULTURES:  UC 7/2 >>  BCx2 7/2 >>  Sputum 7/2 >>   ASSESSMENT / PLAN:  Discussion:  40 y/o M with PMH of HTN, DM II, ESRD and medical non-compliance with recent admit 6/21-6/29 for H. Flu PNA, treated with rocephin, R pleural effusion s/p thora and respiratory failure requiring intubation (6/23).  Prolonged respiratory failure with poor weaning thought related to HCAP + volume overload.  He self extuabted in [facility] and did ok initially, but eveuntually decompensated requiring intubation 7/10.  Acute Respiratory Failure - secondary to HCAP (H. Flu). Volume  overload less likely COPD without acute exacerbation Right Pleural Effusion - s/p thora 1L removed, likely exudative by protein ESRD  HTN   Plan: Full vent support Titrate O2 for sat of 90-95% Continue ABX per [facility] Negative balance as able with HD, currently undergoing HD. Reculture blood and tracheal aspirate Scheduled and PRN bronchodilators If no improvement 24 hours will need to move to acute ICU CT chest vs bedside ultrasound to evaluate effusion(s) if no improvement in 24 hours Sedation per [facility] protocol VAP prevention bundle per [facility]   Joneen Roach, AGACNP-BC [facility] Pager [phone] or [phone]  02/12/2016 1:11 PM  Attending Note:  I have examined patient, reviewed labs, studies and notes. I have discussed the case with Henreitta Leber, and I agree with the data and plans as amended above. 40 yo man, hx ESRD, H flu HCAP. He was transferred to [facility] for VDRF in this setting, self extubated 7/5. Has intermittently required biPAp since, now biPAP dependent. On my evaluation today he was obtunded, would not wake to pain. He is tolerating BiPAP but has poor airway protection. Lungs are coarse and without wheezes. ABG with a mild mixed, primarily respiratory acidosis and hypoxemia. CXR with suspected increase in R hilar infiltrate, bilateral L > R effusions. No reported sedating medications noted in chart. We will plan to ET intubated and ventilate, continue broad abx for probable recurrent PNA, cover for HCAP and for aspiration. Obtain cx's including tracheal aspirate. Consider CT chest to better define infiltrates and effusions if no improvement over next 24h.  Independent critical care time is 55 minutes.   Levy Pupa, MD, PhD 02/12/2016, 2:08 PM [facility] [phone] or if no answer [phone]

## 2016-02-12 NOTE — Consult Note (Signed)
CARDIOLOGY CONSULT NOTE   Patient ID: Tanner Campbell MRN: [id number], DOB/AGE: 02-16-1976   Admit date: 02/01/2016 Date of Consult: 02/12/2016   Primary Physician: No primary care provider on file. Primary Cardiologist: new   Pt. Profile  Tanner Campbell is 40 yo male with PMH of HTN, HLD, DM, Multiple sclerosis, COPD and ESRD on HD since 05/02/2014 presented with acute resp distress, failed multiple extubation attempt. Acute respiratory failure felt to be due to a combination of Haemophilus pneumonia and volume overload. Patient has missed at least 3 episodes of hemodialysis before presentation, nephrology managing hemodialysis.Found to have mildly elevated trop on 7/7-7/8  Problem List  Past Medical History  Diagnosis Date  . Diabetes mellitus without complication (HCC)   . Hypertension   . COPD (chronic obstructive pulmonary disease) (HCC)   . Renal disorder   . Neuropathy (HCC)   . Hemodialysis patient (HCC)   . High cholesterol     Past Surgical History  Procedure Laterality Date  . Cholecystectomy    . Back surgery    . Neck surgery       Allergies  Allergies  Allergen Reactions  . Penicillins Other (See Comments)    Reaction: Unknown    HPI   Tanner Campbell is 40 yo male with PMH of HTN, HLD, DM, Multiple sclerosis, COPD and ESRD on HD since 05/02/2014. He was initially seen at [facility] on 12/22/2015 with acute onset of dyspnea. He had elevated white blood cell count on arrival, he also had hypertensive urgency and flash pulmonary edema and was placed on IV nitroglycerin. He underwent hemodialysis and was eventually discharged on 5/21.   Patient came back to the hospital on 01/24/2016 again with increasing shortness of breath. Chest x-ray showed bilateral pleural effusion. Initial potassium was 6.6, creatinine 8.96, BUN 106, glucose 270, BNP 3282, troponin mildly elevated at 0.04. Hemoglobin 10.9. EKG showed poor R-wave progression in anterior leads,  sinus rhythm, T-wave inversion in the lateral leads. Due to acute respiratory failure, he was placed on BiPAP. He was seen by nephrology who noted he has missed his last 3 outpatient dialysis treatment. He underwent emergent hemodialysis with removal of 3 L. He did have an episode of fall with confusion in the hospital on 6/22. Pulmonary critical care service was consulted on 6/23 for persistent acute respiratory failure with worsening respiratory acidosis despite aggressive BiPAP therapy, patient was intubated and a central line was placed. He underwent ultrasound-guided thoracentesis for moderate to large right pleural effusion by Dr. Nicholos Johns 6/28 with removal of just over 1 L of serous fluid. He was also noted to be have pneumonia, sputum culture was positive for Haemophilus and he was treated with Rocephin. He has failed multiple weaning attempt due to volume overload and respiratory failure with PNA. He was eventually transitioned to [facility] on 6/29.  [facility], hemoglobin A1c obtained on 02/02/2016 was 7.7. TSH normal. Initial chest x-ray after arrival as [facility] shows bibasilar pulmonary infiltrate, bilateral pleural effusion, bilateral nephrocalcinosis, aortoiliac and visceral atherosclerotic vascular disease. He did develop fever and antibiotics were switched to vancomycin, cefepime and Diflucan. PCCM was reconsulted on 7/3. Echocardiogram obtained on 02/07/2016 showed EF 45-50%, akinesis of mid inferior, and inferior septal myocardium, grade 2 diastolic dysfunction, mild AR, small pericardial effusion, mild MR. It appears patient self extubated on 7/5, since extubation, he has required BiPAP intermittently. Unfortunately, after hemodialysis on 7/10, his mental status deteriorated. Chest x-ray consistent with right lower  lobe opacification. He has to be reintubated. He was started on broad spectrum antibiotics to cover for recurrent pneumonia. His EKG obtained on 7/8  showed worsening T wave inversion in the lateral leads. Of note, per [facility]., patient started having bloody drainage from the G-tube 7/10.   No current facility-administered medications on file prior to encounter.   Current Outpatient Prescriptions on File Prior to Encounter  Medication Sig Dispense Refill  . albuterol (PROVENTIL HFA;VENTOLIN HFA) 108 (90 Base) MCG/ACT inhaler Inhale 1-2 puffs into the lungs every 6 (six) hours as needed for wheezing or shortness of breath.    Marland Kitchen amLODipine (NORVASC) 5 MG tablet Take 5 mg by mouth daily.    Marland Kitchen atorvastatin (LIPITOR) 40 MG tablet Take 40 mg by mouth every evening.    . calcium acetate (PHOSLO) 667 MG capsule Take 2,001 mg by mouth 3 (three) times daily with meals.    . carvedilol (COREG) 25 MG tablet Take 25 mg by mouth 2 (two) times daily with a meal.    . DULoxetine (CYMBALTA) 30 MG capsule Take 30 mg by mouth daily.    . Ferric Citrate (AURYXIA PO) Take 1 tablet by mouth daily.     . folic acid-vitamin b complex-vitamin c-selenium-zinc (DIALYVITE) 3 MG TABS tablet Take 1 tablet by mouth daily.    . furosemide (LASIX) 80 MG tablet Take 80 mg by mouth 2 (two) times daily.    Marland Kitchen gabapentin (NEURONTIN) 300 MG capsule Take 300 mg by mouth at bedtime.    Marland Kitchen lisinopril (PRINIVIL,ZESTRIL) 20 MG tablet Take 20 mg by mouth every evening.    . risperiDONE (RISPERDAL) 0.5 MG tablet Take 0.5 mg by mouth at bedtime.        Family History Family History  Problem Relation Age of Onset  . Rheum arthritis Neg Hx   . Osteoarthritis Neg Hx   . Asthma Neg Hx   . Cancer Neg Hx   . Diabetes Neg Hx      Social History Social History   Social History  . Marital Status: Single    Spouse Name: N/A  . Number of Children: N/A  . Years of Education: N/A   Occupational History  . disabled    Social History Main Topics  . Smoking status: Current Every Day Smoker  . Smokeless tobacco: Not on file  . Alcohol Use: No  . Drug Use: No  . Sexual  Activity: Not on file   Other Topics Concern  . Not on file   Social History Narrative     Review of Systems Patient able to provide ROS or previous history he is intubated and sedated\  All other systems reviewed and are otherwise negative except as noted above.  Physical Exam  There were no vitals taken for this visit.  General: Intubated and sedated Psych: unable to assess Neuro: intubated and sedated.   HEENT: Normal, central line in place  Neck: Supple without bruits  Lungs:  Resp regular and unlabored. Anterior exam mixed rale and rhonchi Heart: RRR no s3, s4, or murmurs. +rub noted by MD Abdomen: Soft, non-tender, non-distended, BS + x 4.  Extremities: No clubbing, cyanosis or edema. DP/PT/Radials 2+ and equal bilaterally.  Labs   Recent Labs  02/09/16 2110 02/10/16 0200 02/10/16 1017  CKTOTAL  --  28*  --   CKMB  --  8.5*  --   TROPONINI 0.30* 0.27* 0.19*   Lab Results  Component Value Date   WBC 18.9*  02/11/2016   HGB 10.2* 02/11/2016   HCT 32.7* 02/11/2016   MCV 87.2 02/11/2016   PLT 404* 02/11/2016    Recent Labs Lab 02/11/16 0600 02/12/16 0607  NA 133* 133*  K 5.1 5.0  CL 95* 94*  CO2 23 21*  BUN 122* 155*  CREATININE 4.90* 5.53*  CALCIUM 8.3* 8.1*  PROT 5.2*  --   BILITOT 1.4*  --   ALKPHOS 281*  --   ALT 50  --   AST 42*  --   GLUCOSE 281* 349*   Lab Results  Component Value Date   TRIG 77 01/29/2016   No results found for: DDIMER  Radiology/Studies  Dg Chest 1 View  01/30/2016  CLINICAL DATA:  Dyspnea EXAM: CHEST 1 VIEW COMPARISON:  Yesterday FINDINGS: Endotracheal tube tip between the clavicular heads and carina. Right IJ line with tip at the upper cavoatrial junction. An orogastric tube reaches stomach that is partially gas distended. Bilateral pleural effusion, moderate volume. Chronic cardiomegaly. Bibasilar lung opacity. No edema seen in the apical lungs. Negative for pneumothorax. IMPRESSION: 1. Stable, unremarkable tube  and line positioning. 2. Bilateral moderate pleural effusions with basilar atelectasis or pneumonia. Electronically Signed   By: Marnee Spring M.D.   On: 01/30/2016 07:32   Dg Chest 1 View  01/26/2016  CLINICAL DATA:  Intubated EXAM: CHEST 1 VIEW COMPARISON:  01/24/2016 FINDINGS: Endotracheal tube is 5 cm above the carina. Right central line is in place with the tip in the SVC. No pneumothorax. NG tube enters the stomach. Bilateral lower lobe airspace opacities and layering effusions, not significantly changed since prior study. Heart is borderline in size. IMPRESSION: Endotracheal tube approximately 5 cm above the carina. Right central line in the SVC. No pneumothorax. Continued bilateral lower lobe opacities and effusions, unchanged. Electronically Signed   By: Charlett Nose M.D.   On: 01/26/2016 09:57   Dg Abd 1 View  02/01/2016  CLINICAL DATA:  Pt admitted 6/21, pt is currently intubated and vomiting.known history of End-stage renal disease on dialysis, COPD on home oxygen neuropathy, hyperlipidemia, hypertension, diabetes mellitus type 2. EXAM: ABDOMEN - 1 VIEW COMPARISON:  01/30/2016 FINDINGS: Surgical clips are noted in the right upper quadrant the abdomen. Faint calcifications are identified overlying the renal shadows, consistent with nephrocalcinosis. Arterial calcifications also noted. Bowel gas pattern is nonobstructed. There is gaseous distension of the transverse colon. IMPRESSION: Nonobstructive bowel gas pattern.  Nephrocalcinosis. Electronically Signed   By: Norva Pavlov M.D.   On: 02/01/2016 09:05   Dg Abd 1 View  01/28/2016  CLINICAL DATA:  Constipation and poor urinary output. Hemodialysis patient. EXAM: ABDOMEN - 1 VIEW COMPARISON:  01/26/2016 FINDINGS: The NG tube tip is in the body region of the stomach. The stomach is moderately distended with air. Moderate stool throughout the colon and down into the rectum may suggest constipation. No findings for obstruction or free air.  Extensive vascular calcifications are noted vertically involving the renal arteries. Severe chronic lung disease and bilateral pleural effusions. IMPRESSION: Moderate constipation.  No findings for obstruction or perforation. Electronically Signed   By: Rudie Meyer M.D.   On: 01/28/2016 21:02   Dg Abd 1 View  01/26/2016  CLINICAL DATA:  ET tube placement, OG tube placement. EXAM: ABDOMEN - 1 VIEW COMPARISON:  None. FINDINGS: Enteric tube tip is in the proximal to mid stomach. Prior cholecystectomy. Normal bowel gas pattern. IMPRESSION: Enteric tube tip in the proximal to mid stomach. Electronically Signed   By: Caryn Bee  [place] M.D.   On: 01/26/2016 09:54   Dg Chest Port 1 View  02/12/2016  CLINICAL DATA:  Intubated.  Respiratory failure. EXAM: PORTABLE CHEST 1 VIEW COMPARISON:  Earlier today. FINDINGS: Endotracheal tube in satisfactory position. Nasogastric tube tip in the distal stomach. Right jugular catheter tip in the upper right atrium. Bilateral pleural effusions and bibasilar atelectasis without significant change. Mild scoliosis. IMPRESSION: 1. Endotracheal tube in satisfactory position. 2. Right jugular catheter tip in the upper right atrium. This could be retracted 1.5 cm to place it at the cavoatrial junction. 3. Stable bilateral pleural effusions and bibasilar atelectasis with possible pneumonia. Electronically Signed   By: Beckie Salts M.D.   On: 02/12/2016 13:19   Dg Chest Port 1 View  02/12/2016  CLINICAL DATA:  Acute respiratory failure EXAM: PORTABLE CHEST 1 VIEW COMPARISON:  02/09/2016 FINDINGS: Cardiac shadow remains enlarged. A right jugular central line is again seen. Endotracheal tube is now noted with the tip approximately 5 cm above the carina. Nasogastric catheter is seen within the stomach. Bilateral pleural effusions are again seen. Patchy changes in the bases bilaterally although improved aeration is noted. No bony abnormality is seen. IMPRESSION: Tubes and lines as described  above. Stable bilateral pleural effusions. Bibasilar infiltrates with improved aeration when compared with the prior exam. Electronically Signed   By: Alcide Clever M.D.   On: 02/12/2016 12:45   Dg Chest Port 1 View  02/09/2016  CLINICAL DATA:  RIGHT IJ line placement EXAM: PORTABLE CHEST 1 VIEW COMPARISON:  Portable exam 1339 hours compared to 02/07/2016. FINDINGS: RIGHT jugular line with tip projecting over SVC near cavoatrial junction. Interval removal of nasogastric tube. Enlargement of cardiac silhouette with vascular congestion. BILATERAL perihilar and basilar infiltrates with associated pleural effusions. No pneumothorax. Bones unremarkable. IMPRESSION: No pneumothorax following RIGHT jugular line placement. Encroachment of cardiac silhouette with pulmonary vascular congestion and BILATERAL infiltrates which could represent pulmonary edema or infection. Bibasilar effusions and atelectasis. Electronically Signed   By: Ulyses Southward M.D.   On: 02/09/2016 13:50   Dg Chest Port 1 View  02/07/2016  CLINICAL DATA:  Central line placement EXAM: PORTABLE CHEST 1 VIEW COMPARISON:  Portable exam 1711 hours compared to 02/07/2016 at 0602 hours FINDINGS: Nasogastric tube extends into stomach. New LEFT jugular central venous catheter with tip projecting over SVC. Previously identified RIGHT jugular line no longer seen. Numerous EKG leads project over chest. Enlargement of cardiac silhouette with minimal vascular congestion. Bibasilar pulmonary infiltrates and pleural effusions identified. No pneumothorax. Prior cervical spine fusion. IMPRESSION: Enlargement of cardiac silhouette. Bibasilar pleural effusions and atelectasis. No pneumothorax following LEFT jugular line placement. Electronically Signed   By: Ulyses Southward M.D.   On: 02/07/2016 17:25   Dg Chest Port 1 View  02/07/2016  CLINICAL DATA:  Respiratory failure, COPD, end-stage renal disease, pulmonary edema. EXAM: PORTABLE CHEST 1 VIEW COMPARISON:  Portable chest  x-ray of February 04, 2016 FINDINGS: The lungs are reasonably well inflated. There is small bilateral pleural effusions. There is bibasilar atelectasis or pneumonia. The interstitial markings are increased diffusely. The cardiac silhouette remains enlarged. The endotracheal tube is not clearly evident. The esophagogastric tube tip projects below the inferior margin of the image. The right internal jugular venous catheter tip projects over the distal third of the SVC. IMPRESSION: CHF with bibasilar atelectasis or pneumonia and small bilateral pleural effusion. There has not been dramatic interval change since yesterday's study. Interval extubation of the trachea. Electronically Signed   By: Onalee Hua  [place] M.D.   On: 02/07/2016 07:16   Dg Chest Port 1 View  02/04/2016  CLINICAL DATA:  Followup pneumonia EXAM: PORTABLE CHEST 1 VIEW COMPARISON:  02/02/2016 FINDINGS: Cardiac shadow is stable. Endotracheal tube, nasogastric catheter and right jugular line are again seen and stable. Bibasilar infiltrates as well as small effusions are again seen and stable. No new focal abnormality is noted. IMPRESSION: No change from the prior exam.  Stable bibasilar infiltrates. Electronically Signed   By: Alcide Clever M.D.   On: 02/04/2016 14:08   Dg Chest Port 1 View  02/02/2016  CLINICAL DATA:  CHF, COPD, sepsis, acute and chronic respiratory failure. EXAM: PORTABLE CHEST 1 VIEW COMPARISON:  Portable chest x-ray of February 01, 2016 FINDINGS: The lungs are adequately inflated. Bibasilar atelectasis or pneumonia persists. There small bilateral pleural effusions. There is no pneumothorax. The pulmonary interstitial markings are more conspicuous bilaterally. The cardiac silhouette is top-normal in size. The endotracheal tube tip lies 4.9 cm above the carina. The esophagogastric tube tip projects below the inferior margin of the image. The right internal jugular venous catheter tip projects over the mid to distal SVC. IMPRESSION: Slight  interval deterioration in the appearance of the pulmonary interstitium consistent with increased interstitial edema. Persistent bibasilar atelectasis or pneumonia with small bilateral pleural effusions. The support tubes are in stable position. Electronically Signed   By: David  [place] M.D.   On: 02/02/2016 07:25   Dg Chest Port 1 View  02/01/2016  CLINICAL DATA:  Acute on chronic respiratory failure ; status post thoracentesis EXAM: PORTABLE CHEST 1 VIEW COMPARISON:  Portable chest x-ray of January 31, 2016 FINDINGS: Volume of pleural fluid on the left appears decreased. There is persistent alveolar opacity in the lower left lung consistent with pneumonia. On the right a small pleural effusion persists. There is persistent alveolar opacity inferior laterally. The heart is top-normal in size. The pulmonary vascularity is not engorged. The endotracheal tube tip lies 5.3 cm above the carina. The esophagogastric tube tip projects below the inferior margin of the image. The right internal jugular venous catheter tip projects over the midportion of the SVC. IMPRESSION: Slight decrease in pleural fluid volume on the left presumably due to the reported thoracentesis. Persistent bibasilar atelectasis or pneumonia. Persistent small right pleural effusion. The support tubes are in stable position. Electronically Signed   By: David  [place] M.D.   On: 02/01/2016 07:34   Dg Chest Port 1 View  01/31/2016  CLINICAL DATA:  Respiratory failure. EXAM: PORTABLE CHEST 1 VIEW COMPARISON:  01/31/2016 at 15:57 FINDINGS: The endotracheal tube is 5.1 cm above the carina. The nasogastric tube extends into the stomach. The right jugular central line extends to the lower SVC. There are small to moderate pleural effusions bilaterally. There is no pneumothorax. There are patchy opacities in the lateral bases, unchanged. IMPRESSION: 1.  Support equipment appears satisfactorily positioned. 2. No significant interval change in the bilateral  airspace opacities and pleural effusions. Electronically Signed   By: Ellery Plunk M.D.   On: 01/31/2016 22:45   Dg Chest Port 1 View  01/31/2016  CLINICAL DATA:  Status post thoracentesis of right pleural effusion. EXAM: PORTABLE CHEST 1 VIEW COMPARISON:  Radiograph of January 30, 2016. FINDINGS: Stable cardiomediastinal silhouette. No pneumothorax is noted. Endotracheal and nasogastric tubes are unchanged in position. Right internal jugular catheter is unchanged. Bibasilar lung opacities are noted concerning for edema, atelectasis or pneumonia. Right pleural effusion is smaller status post thoracentesis. Mild bilateral pleural effusions  remain. Bony thorax is unremarkable. IMPRESSION: Stable support apparatus. No pneumothorax status post right-sided thoracentesis. Bibasilar opacities remain as described above. Electronically Signed   By: Lupita Raider, M.D.   On: 01/31/2016 16:10   Dg Chest Port 1 View  01/29/2016  CLINICAL DATA:  Acute respiratory failure.  Intubated patient. EXAM: PORTABLE CHEST 1 VIEW COMPARISON:  01/28/2016 and 12/22/2015 FINDINGS: Endotracheal tube is 3.7 cm above the carina. Right jugular central line tip in the SVC region. Nasogastric tube extends into the abdomen but the tip is beyond the image. There continues to be bibasilar chest densities compatible with pleural fluid and atelectasis/consolidation. Few patchy airspace densities may represent pulmonary edema. In addition, there is a rounded opacity along the lateral aspect of the left lung base. Limited evaluation of the cardiac silhouette due to the basilar chest densities. Negative for pneumothorax. Surgical plate in the lower cervical spine. IMPRESSION: Persistent bibasilar chest densities compatible with bilateral pleural effusions and consolidation. Bibasilar chest densities have not significantly changed. However, there is a rounded density at the left lung base. This may represent focal consolidation or loculated  pleural fluid. Cannot exclude an underlying lesion in this area. Recommend continued follow-up and a consider CT evaluation if this area does not resolve. Mild pulmonary edema. Support apparatuses as described. Electronically Signed   By: Richarda Overlie M.D.   On: 01/29/2016 07:54   Dg Chest Port 1 View  01/28/2016  CLINICAL DATA:  Acute respiratory failure, diabetes mellitus, hypertension, COPD, end-stage renal disease EXAM: PORTABLE CHEST 1 VIEW COMPARISON:  Portable exam 0549 hours compared to 01/27/2016 FINDINGS: Tip of endotracheal tube projects 3.4 cm above carina. Nasogastric tube extends into stomach. RIGHT jugular central venous catheter with tip projecting over SVC. Enlargement of cardiac silhouette with vascular congestion. BILATERAL perihilar infiltrates greater on RIGHT likely representing asymmetric edema. Bibasilar effusions and atelectasis, unable to exclude consolidation in lower lobes. No pneumothorax. Bones demineralized. Prior cervical spine fusion. IMPRESSION: Persistent pulmonary edema with bibasilar effusions and atelectasis as above. Electronically Signed   By: Ulyses Southward M.D.   On: 01/28/2016 08:48   Dg Chest Port 1 View  01/27/2016  CLINICAL DATA:  Acute respiratory failure. History of hypertension and COPD. EXAM: PORTABLE CHEST 1 VIEW COMPARISON:  Chest x-ray dated 01/26/2016. FINDINGS: Endotracheal tube is adequately positioned with tip approximately 4 cm above the carina. Right IJ central line is well positioned with tip at the level of the lower SVC. Cardiomediastinal silhouette is stable in size and configuration. There are bilateral pleural effusions, left greater than right, at least moderate in size on the left, stable. Patchy opacities at the adjacent lung bases are probably atelectasis. There is mild interstitial edema without significant interval change. No new lung abnormality seen. No pneumothorax. IMPRESSION: 1. Stable chest x-ray. 2. Bilateral pleural effusions, left  greater than right, with probable adjacent atelectasis. 3. Mild interstitial edema. Electronically Signed   By: Bary Richard M.D.   On: 01/27/2016 10:43   Dg Chest Portable 1 View  01/24/2016  CLINICAL DATA:  40 year old male with shortness of breath. EXAM: PORTABLE CHEST 1 VIEW COMPARISON:  Chest radiograph dated 12/24/2015 FINDINGS: There is stable bilateral pleural effusions and the bilateral mid to lower lung field airspace opacities compatible with atelectasis/ infiltrate. There is no pneumothorax. Stable cardiac silhouette. No acute osseous pathology. IMPRESSION: No significant interval change in bilateral pleural effusions and bilateral mid to lower lung field opacities. Electronically Signed   By: Ceasar Mons.D.  On: 01/24/2016 22:05   Dg Abd Portable 1v  02/10/2016  CLINICAL DATA:  NG tube placement. EXAM: PORTABLE ABDOMEN - 1 VIEW COMPARISON:  02/07/2016 FINDINGS: Tip and side port of the enteric tube below the diaphragm in the stomach. No dilated bowel loops to suggest obstruction. Cholecystectomy clips in the right upper quadrant of the abdomen. Bilateral pleural effusions in the included lung bases. IMPRESSION: Tip and side port of the enteric tube below the diaphragm in the stomach. Electronically Signed   By: Rubye Oaks M.D.   On: 02/10/2016 03:15   Dg Abd Portable 1v  02/07/2016  CLINICAL DATA:  Nasogastric tube placement.  Initial encounter. EXAM: PORTABLE ABDOMEN - 1 VIEW COMPARISON:  Abdominal radiograph performed 02/02/2016 FINDINGS: The patient's enteric tube is noted ending overlying the body of the stomach. The visualized bowel gas pattern is unremarkable. Scattered air and stool filled loops of colon are seen; no abnormal dilatation of small bowel loops is seen to suggest small bowel obstruction. No free intra-abdominal air is identified, though evaluation for free air is limited on a single supine view. Clips are noted within the right upper quadrant, reflecting prior  cholecystectomy. The visualized osseous structures are within normal limits; the sacroiliac joints are unremarkable in appearance. Small bilateral pleural effusions are again noted. IMPRESSION: Enteric tube noted ending overlying the body of the stomach. Electronically Signed   By: Roanna Raider M.D.   On: 02/07/2016 06:29   Dg Abd Portable 1v  02/02/2016  CLINICAL DATA:  Orogastric tube placement.  End-stage renal disease. EXAM: PORTABLE ABDOMEN - 1 VIEW COMPARISON:  02/01/2016. FINDINGS: Orogastric tube noted with tip in side hole in the stomach. Surgical clips right upper quadrant. Soft tissues of the abdomen are unremarkable. No bowel distention. Bibasilar pulmonary infiltrates and pleural effusions noted. Bilateral nephrocalcinosis again noted. Aortoiliac and visceral vascular calcifications. IMPRESSION: 1. Orogastric tube noted with tip and side hole in the stomach. No acute intra-abdominal abnormality . 2.  Bibasilar pulmonary infiltrates and bilateral pleural effusions. 3.  Aortoiliac and visceral atherosclerotic vascular disease. 4. Bilateral nephrocalcinosis again noted. This may be related to the patient's known end-stage renal disease. Electronically Signed   By: Maisie Fus  Register   On: 02/02/2016 07:32   Dg Abd Portable 1v  01/30/2016  CLINICAL DATA:  Nasogastric tube placement. EXAM: PORTABLE ABDOMEN - 1 VIEW COMPARISON:  01/28/2016 FINDINGS: Nasogastric tube is in place, tip overlying the level of the stomach. There is marked gaseous distension of stomach. Bowel gas pattern is otherwise nonobstructive. Surgical clips are noted in the right upper quadrant and the pelvis. Visualized osseous structures have a normal appearance. IMPRESSION: 1. Nasogastric tube placement to the stomach. 2. Marked gaseous distension of the stomach. Electronically Signed   By: Norva Pavlov M.D.   On: 01/30/2016 15:10   US Thoracentesis Asp Pleural Space W/img Guide  01/31/2016  INDICATION: Right pleural  effusion. EXAM: ULTRASOUND GUIDED right THORACENTESIS MEDICATIONS: None. COMPLICATIONS: None immediate. PROCEDURE: Informed consent was obtained from patient's family. Ultrasound was performed to localize and mark an adequate pocket of fluid in the right chest. The area was then prepped and draped in the normal sterile fashion. 1% Lidocaine was used for local anesthesia. Under ultrasound guidance a Safe-T-Centesis catheter was introduced. Thoracentesis was performed. The catheter was removed and a dressing applied. FINDINGS: A total of approximately 1.1 L of dark serous fluid was removed. Samples were sent to the laboratory as requested by the clinical team. IMPRESSION: Successful ultrasound guided right  thoracentesis yielding 1.1 L of pleural fluid. Electronically Signed   By: Lupita Raider, M.D.   On: 01/31/2016 16:45    ECG  NSR with diffuse TWI in anterior and inferior leads which is new when compare to 7/8  ASSESSMENT AND PLAN  1. Elevated trop  - unclear if type 1 or type 2, he does have more pronounced TWI in lateral leads, however no ST changes on last EKG on 7/8, we will repeat another one  - Echo obtained on 02/07/2016 showed EF 45-50%, akinesis of mid inferior, and inferior septal myocardium, grade 2 diastolic dysfunction, mild AR, small pericardial effusion, mild MR  - would not be surprised if he has significant underlying CAD esp mildly low EF and wall motion abnormality. However doubt if he is a candidate for invasive workup or noninvasive workup at this time or near future. Primary issue is respiratory. Discussed with [facility] MD who says his OG has been draining blood this morning which further exclude him from further cardiac workup.   Addendum: EKG showed diffuse TWI in anterior and inferior leads which is new. Will obtain serial trop  2. Acute respiratory failure: s/p intubation on 6/23 for worsening ABG, treated with multiple HD for fluid removal and a course of antibiotic for PNA  with positive hemophillus culture, failed multiple weaning attempt, discharged to [facility] on 6/30, self extubated on 7/5, however had worsening resp distress after HD session on 7/10 and was reintubated. Receiving treatment again with antibiotics.  - poor overall prognosis given recurrent need for intubation. Fluid status managed by nephrology.   3. ESRD on HD since 05/02/2014  - appears to be only partially compliant with HD as outpatient, missed at least 3 sessions before he was admitted this time  4. Possible GI bleed: per [facility], started today, [facility] plan to stop Lovenox, start Protonix, recommend SCD for VTE prophylaxis although he is getting his BP with the BP cuff on his R leg  5. HTN: appears to be stable at this time  6. HLD: on lipitor  7. DM   8. COPD    Signed, Azalee Course, PA-C 02/12/2016, 3:40 PM

## 2016-02-12 NOTE — Progress Notes (Signed)
[facility]  ROUNDING NOTE   Subjective:  Patient status unfortunately worsened today after dialysis. He has developed recurrent acute respiratory failure. ABG revealed pH of 7.3, PCO2 59, PO2 76. His ABG get improved post intubation. He underwent dialysis and ultrafiltration achieved was 3 kg.   Objective:  Vital signs in last 24 hours:  Temperature 97.2 pulse 90 respirations 19 blood pressure 101/58     Physical Exam: General: Critically ill appearing  Head: [place]/AT ETT and NG in place  Eyes: Anicteric  Neck: Supple, trachea midline  Lungs:  Scattered rhonchi, vent assisted  Heart: S1S2, 3/6 systolic ejection murmur  Abdomen:  Soft, nontender. BS present  Extremities: No peripheral edema.  Neurologic: On the ventilator, not following commands  Skin: No lesions  Access: Left arm AVF good thrill    Basic Metabolic Panel:  Recent Labs Lab 02/07/16 0545 02/09/16 0638 02/11/16 0600 02/12/16 0607  NA 136  137 137 133* 133*  K 3.5  3.6 4.1 5.1 5.0  CL 96*  97* 97* 95* 94*  CO2 21*  GLUCOSE 273*  276* 35* 281* 349*  BUN 93*  93* 102* 122* 155*  CREATININE 3.89*  3.97* 4.01* 4.90* 5.53*  CALCIUM 8.6*  8.6* 8.7* 8.3* 8.1*  PHOS 5.7* 5.0*  --  7.5*    Liver Function Tests:  Recent Labs Lab 02/07/16 0545 02/09/16 0638 02/11/16 0600 02/12/16 0607  AST  --   --  42*  --   ALT  --   --  50  --   ALKPHOS  --   --  281*  --   BILITOT  --   --  1.4*  --   PROT  --   --  5.2*  --   ALBUMIN 1.9* 2.2* 2.1* 2.1*   No results for input(s): LIPASE, AMYLASE in the last 168 hours. No results for input(s): AMMONIA in the last 168 hours.  CBC:  Recent Labs Lab 02/07/16 0545 02/09/16 0638 02/10/16 0430 02/11/16 0600  WBC 17.7* 34.8* 15.6* 18.9*  NEUTROABS  --   --   --  17.9*  HGB 10.6* 12.2* 10.0* 10.2*  HCT 35.0* 38.6* 32.4* 32.7*  MCV 90.0 86.0 85.3 87.2  PLT 210 308 233 404*    Cardiac Enzymes:  Recent Labs Lab  02/09/16 1252 02/09/16 2110 02/10/16 0200 02/10/16 1017  CKTOTAL 72  --  28*  --   CKMB 11.4*  --  8.5*  --   TROPONINI 0.25* 0.30* 0.27* 0.19*    BNP: Invalid input(s): POCBNP  CBG: No results for input(s): GLUCAP in the last 168 hours.  Microbiology: Results for orders placed or performed during the hospital encounter of 02/01/16  Culture, blood (routine x 2)     Status: None   Collection Time: 02/04/16  2:00 PM  Result Value Ref Range Status   Specimen Description BLOOD PICC LINE  Final   Special Requests BOTTLES DRAWN AEROBIC AND ANAEROBIC 10CC  Final   Culture NO GROWTH 6 DAYS  Final   Report Status 02/10/2016 FINAL  Final  Culture, blood (routine x 2)     Status: None   Collection Time: 02/04/16  2:17 PM  Result Value Ref Range Status   Specimen Description BLOOD BLOOD RIGHT HAND  Final   Special Requests BOTTLES DRAWN AEROBIC AND ANAEROBIC 5CC  Final   Culture NO GROWTH 6 DAYS  Final   Report Status 02/10/2016 FINAL  Final  Culture,  Urine     Status: None   Collection Time: 02/04/16  2:28 PM  Result Value Ref Range Status   Specimen Description URINE, RANDOM  Final   Special Requests NONE  Final   Culture NO GROWTH  Final   Report Status 02/05/2016 FINAL  Final  Culture, respiratory (NON-Expectorated)     Status: None   Collection Time: 02/04/16  3:00 PM  Result Value Ref Range Status   Specimen Description TRACHEAL ASPIRATE  Final   Special Requests NONE  Final   Gram Stain   Final    MODERATE WBC PRESENT, PREDOMINANTLY PMN RARE SQUAMOUS EPITHELIAL CELLS PRESENT FEW GRAM POSITIVE RODS RARE GRAM POSITIVE COCCI IN PAIRS RARE GRAM NEGATIVE RODS    Culture Consistent with normal respiratory flora.  Final   Report Status 02/06/2016 FINAL  Final    Coagulation Studies: No results for input(s): LABPROT, INR in the last 72 hours.  Urinalysis: No results for input(s): COLORURINE, LABSPEC, PHURINE, GLUCOSEU, HGBUR, BILIRUBINUR, KETONESUR, PROTEINUR,  UROBILINOGEN, NITRITE, LEUKOCYTESUR in the last 72 hours.  Invalid input(s): APPERANCEUR    Imaging: Dg Chest Port 1 View  02/12/2016  CLINICAL DATA:  Intubated.  Respiratory failure. EXAM: PORTABLE CHEST 1 VIEW COMPARISON:  Earlier today. FINDINGS: Endotracheal tube in satisfactory position. Nasogastric tube tip in the distal stomach. Right jugular catheter tip in the upper right atrium. Bilateral pleural effusions and bibasilar atelectasis without significant change. Mild scoliosis. IMPRESSION: 1. Endotracheal tube in satisfactory position. 2. Right jugular catheter tip in the upper right atrium. This could be retracted 1.5 cm to place it at the cavoatrial junction. 3. Stable bilateral pleural effusions and bibasilar atelectasis with possible pneumonia. Electronically Signed   By: Beckie Salts M.D.   On: 02/12/2016 13:19   Dg Chest Port 1 View  02/12/2016  CLINICAL DATA:  Acute respiratory failure EXAM: PORTABLE CHEST 1 VIEW COMPARISON:  02/09/2016 FINDINGS: Cardiac shadow remains enlarged. A right jugular central line is again seen. Endotracheal tube is now noted with the tip approximately 5 cm above the carina. Nasogastric catheter is seen within the stomach. Bilateral pleural effusions are again seen. Patchy changes in the bases bilaterally although improved aeration is noted. No bony abnormality is seen. IMPRESSION: Tubes and lines as described above. Stable bilateral pleural effusions. Bibasilar infiltrates with improved aeration when compared with the prior exam. Electronically Signed   By: Alcide Clever M.D.   On: 02/12/2016 12:45     Medications:       Assessment/ Plan:  [name] is a 40 y.o. white male with End-stage renal disease with hemodialysis Started hemodialysis May 02, 2014, Hypertension, Diabetes mellitus type 2, hyperlipidemia, Multiple sclerosis, COPD, Back surgery, Cholecystectomy, Tobacco use, acute respiratory failure 01/2016 originally at [facility] then  transitioned to [facility]  TTS [facility]. left arm AVF  1. End Stage Renal Disease: with pulmonary edema and hyperkalemia.  - patient completed hemodialysis today. Ultrafiltration achieved was 3 kg. We will plan for dialysis again on Wednesday unless necessitated earlier by clinical condition.  2. Acute resp failure - multifactorial - pulm edema, possible underlying pneumonia, COPD exacerbation - self extubated 02/07/2016 - Reintubated 7/101/7 due to hypercarbia, hypoxemia. -  Continue ultrafiltration with dialysis for fluid removal.  3. Anemia of chronic kidney disease: hemoglobin has dropped a bit down to 10.2. If discussed further we will consider Aranesp. Continue to monitor hemoglobin for now.  4. Secondary hyperparathyroidism. Phosphorus was down to 5.0 last week. Now  back up to 7.5. Phosphorus should improve with further dialysis this week. Since he is on the ventilator we will avoid binders for now.   LOS:  Tanner Campbell 7/10/20173:27 PM

## 2016-02-13 DIAGNOSIS — I4581 Long QT syndrome: Secondary | ICD-10-CM

## 2016-02-13 LAB — BASIC METABOLIC PANEL
ANION GAP: 12 (ref 5–15)
BUN: 70 mg/dL — AB (ref 6–20)
CO2: 23 mmol/L (ref 22–32)
Calcium: 7.7 mg/dL — ABNORMAL LOW (ref 8.9–10.3)
Chloride: 98 mmol/L — ABNORMAL LOW (ref 101–111)
Creatinine, Ser: 3.53 mg/dL — ABNORMAL HIGH (ref 0.61–1.24)
GFR calc Af Amer: 23 mL/min — ABNORMAL LOW (ref >60)
GFR, EST NON AFRICAN AMERICAN: 20 mL/min — AB (ref >60)
GLUCOSE: 240 mg/dL — AB (ref 65–99)
POTASSIUM: 4.9 mmol/L (ref 3.5–5.1)
Sodium: 133 mmol/L — ABNORMAL LOW (ref 135–145)

## 2016-02-13 LAB — CBC
HEMATOCRIT: 30.6 % — AB (ref 39.0–52.0)
Hemoglobin: 9.3 g/dL — ABNORMAL LOW (ref 13.0–17.0)
MCH: 26.5 pg (ref 26.0–34.0)
MCHC: 30.4 g/dL (ref 30.0–36.0)
MCV: 87.2 fL (ref 78.0–100.0)
PLATELETS: 253 10*3/uL (ref 150–400)
RBC: 3.51 MIL/uL — AB (ref 4.22–5.81)
RDW: 16.9 % — AB (ref 11.5–15.5)
WBC: 14.7 10*3/uL — AB (ref 4.0–10.5)

## 2016-02-13 LAB — TROPONIN I: Troponin I: 0.4 ng/mL (ref <0.03)

## 2016-02-13 NOTE — Progress Notes (Signed)
DAILY PROGRESS NOTE  Subjective:  No events overnight. H/H mildly reduced from 10/32 to 9/30. Some blood in NT tube. Aspirin discontinued. EKG yesterday shows deep TWI's. Repeat troponin was elevated to 0.5, but subsequent troponin is declining. Given possible GIB, unable to anticoagulate at this time.  Objective: Vitals:  BP: 145/68, T 99.1, P88, RR 15, SPO2 100% on vent  Medications: No current facility-administered medications for this encounter.    Physical Exam: General appearance: opens eyes to stimulus, on vent Lungs: diminished breath sounds bilaterally and rhonchi bilaterally Heart: regular rate and rhythm Extremities: extremities normal, atraumatic, no cyanosis or edema Pulses: 2+ and symmetric Neurologic: Mental status: opens eyes to stimulus, did not follow command to squeeze my hand  Lab Results: Results for orders placed or performed during the hospital encounter of 02/01/16 (from the past 48 hour(s))  Renal function panel     Status: Abnormal   Collection Time: 02/12/16  6:07 AM  Result Value Ref Range   Sodium 133 (L) 135 - 145 mmol/L   Potassium 5.0 3.5 - 5.1 mmol/L   Chloride 94 (L) 101 - 111 mmol/L   CO2 21 (L) 22 - 32 mmol/L   Glucose, Bld 349 (H) 65 - 99 mg/dL   BUN 155 (H) 6 - 20 mg/dL   Creatinine, Ser 5.53 (H) 0.61 - 1.24 mg/dL   Calcium 8.1 (L) 8.9 - 10.3 mg/dL   Phosphorus 7.5 (H) 2.5 - 4.6 mg/dL   Albumin 2.1 (L) 3.5 - 5.0 g/dL   GFR calc non Af Amer 12 (L) >60 mL/min   GFR calc Af Amer 13 (L) >60 mL/min    Comment: (NOTE) The eGFR has been calculated using the CKD EPI equation. This calculation has not been validated in all clinical situations. eGFR's persistently <60 mL/min signify possible Chronic Kidney Disease.    Anion gap 18 (H) 5 - 15  Blood gas, arterial     Status: Abnormal   Collection Time: 02/12/16 11:45 AM  Result Value Ref Range   FIO2 1.00    Delivery systems BILEVEL POSITIVE AIRWAY PRESSURE    Inspiratory PAP 16    Expiratory PAP 8    pH, Arterial 7.315 (L) 7.350 - 7.450   pCO2 arterial 59.0 (HH) 35.0 - 45.0 mmHg    Comment: CRITICAL RESULT CALLED TO, READ BACK BY AND VERIFIED WITH: KRISTEN DEAL, RRT AT 1158, BY TAMMIEREADLING RRT, ON 02/12/16     pO2, Arterial 76.9 (L) 80.0 - 100.0 mmHg   Bicarbonate 29.2 (H) 20.0 - 24.0 mEq/L   TCO2 31.0 0 - 100 mmol/L   Acid-Base Excess 3.5 (H) 0.0 - 2.0 mmol/L   O2 Saturation 91.9 %   Patient temperature 98.6    Collection site BRACHIAL ARTERY    Drawn by [id number]    Sample type ARTERIAL DRAW    Allens test (pass/fail) PASS PASS  Blood gas, arterial     Status: Abnormal   Collection Time: 02/12/16  1:59 PM  Result Value Ref Range   FIO2 0.35    Delivery systems VENTILATOR    Mode ASSIST CONTROL    VT 500 mL   LHR 15 resp/min   Peep/cpap 5.0 cm H20   pH, Arterial 7.508 (H) 7.350 - 7.450   pCO2 arterial 34.0 (L) 35.0 - 45.0 mmHg   pO2, Arterial 104 (H) 80.0 - 100.0 mmHg   Bicarbonate 26.8 (H) 20.0 - 24.0 mEq/L   TCO2 27.8 0 - 100 mmol/L   Acid-Base  Excess 3.7 (H) 0.0 - 2.0 mmol/L   O2 Saturation 98.0 %   Patient temperature 98.6    Collection site BRACHIAL ARTERY    Drawn by [phone]    Sample type ARTERIAL DRAW   Culture, respiratory (NON-Expectorated)     Status: None (Preliminary result)   Collection Time: 02/12/16  1:59 PM  Result Value Ref Range   Specimen Description TRACHEAL ASPIRATE    Special Requests NONE    Gram Stain      ABUNDANT WBC PRESENT,BOTH PMN AND MONONUCLEAR ABUNDANT GRAM NEGATIVE RODS FEW GRAM POSITIVE COCCI IN PAIRS    Culture PENDING    Report Status PENDING   Troponin I (q 6hr x 3)     Status: Abnormal   Collection Time: 02/12/16  5:17 PM  Result Value Ref Range   Troponin I 0.50 (HH) <0.03 ng/mL    Comment: CRITICAL RESULT CALLED TO, READ BACK BY AND VERIFIED WITH: K GILLISPIE,RN 1754 02/12/2016 WBOND   Hemoglobin and hematocrit, blood     Status: Abnormal   Collection Time: 02/12/16  5:17 PM  Result Value Ref  Range   Hemoglobin 9.6 (L) 13.0 - 17.0 g/dL   HCT 30.7 (L) 39.0 - 52.0 %  Troponin I (q 6hr x 3)     Status: Abnormal   Collection Time: 02/13/16  5:37 AM  Result Value Ref Range   Troponin I 0.40 (HH) <0.03 ng/mL    Comment: CRITICAL VALUE NOTED.  VALUE IS CONSISTENT WITH PREVIOUSLY REPORTED AND CALLED VALUE.  CBC     Status: Abnormal   Collection Time: 02/13/16  5:37 AM  Result Value Ref Range   WBC 14.7 (H) 4.0 - 10.5 K/uL   RBC 3.51 (L) 4.22 - 5.81 MIL/uL   Hemoglobin 9.3 (L) 13.0 - 17.0 g/dL   HCT 30.6 (L) 39.0 - 52.0 %   MCV 87.2 78.0 - 100.0 fL   MCH 26.5 26.0 - 34.0 pg   MCHC 30.4 30.0 - 36.0 g/dL   RDW 16.9 (H) 11.5 - 15.5 %   Platelets 253 150 - 400 K/uL  Basic metabolic panel     Status: Abnormal   Collection Time: 02/13/16  5:37 AM  Result Value Ref Range   Sodium 133 (L) 135 - 145 mmol/L   Potassium 4.9 3.5 - 5.1 mmol/L   Chloride 98 (L) 101 - 111 mmol/L   CO2 23 22 - 32 mmol/L   Glucose, Bld 240 (H) 65 - 99 mg/dL   BUN 70 (H) 6 - 20 mg/dL   Creatinine, Ser 3.53 (H) 0.61 - 1.24 mg/dL    Comment: DELTA CHECK NOTED   Calcium 7.7 (L) 8.9 - 10.3 mg/dL   GFR calc non Af Amer 20 (L) >60 mL/min   GFR calc Af Amer 23 (L) >60 mL/min    Comment: (NOTE) The eGFR has been calculated using the CKD EPI equation. This calculation has not been validated in all clinical situations. eGFR's persistently <60 mL/min signify possible Chronic Kidney Disease.    Anion gap 12 5 - 15    Imaging: Dg Chest Port 1 View  02/12/2016  CLINICAL DATA:  Intubated.  Respiratory failure. EXAM: PORTABLE CHEST 1 VIEW COMPARISON:  Earlier today. FINDINGS: Endotracheal tube in satisfactory position. Nasogastric tube tip in the distal stomach. Right jugular catheter tip in the upper right atrium. Bilateral pleural effusions and bibasilar atelectasis without significant change. Mild scoliosis. IMPRESSION: 1. Endotracheal tube in satisfactory position. 2. Right jugular catheter tip in  the upper right  atrium. This could be retracted 1.5 cm to place it at the cavoatrial junction. 3. Stable bilateral pleural effusions and bibasilar atelectasis with possible pneumonia. Electronically Signed   By: Claudie Revering M.D.   On: 02/12/2016 13:19   Dg Chest Port 1 View  02/12/2016  CLINICAL DATA:  Acute respiratory failure EXAM: PORTABLE CHEST 1 VIEW COMPARISON:  02/09/2016 FINDINGS: Cardiac shadow remains enlarged. A right jugular central line is again seen. Endotracheal tube is now noted with the tip approximately 5 cm above the carina. Nasogastric catheter is seen within the stomach. Bilateral pleural effusions are again seen. Patchy changes in the bases bilaterally although improved aeration is noted. No bony abnormality is seen. IMPRESSION: Tubes and lines as described above. Stable bilateral pleural effusions. Bibasilar infiltrates with improved aeration when compared with the prior exam. Electronically Signed   By: Inez Catalina M.D.   On: 02/12/2016 12:45    Assessment:  1. Principal Problem: 2.   Acute on chronic respiratory failure ([place]) 3. Active Problems: 4.   Accelerated hypertension 5.   Sepsis ([place]) 6.   ESRD on dialysis ([place]) 7.   COPD (chronic obstructive pulmonary disease) ([place]) 8.   Acute on chronic renal failure ([facility]) 9.   Pleural effusion 10.   Elevated troponin 11.   CHF (congestive heart failure) ([place]) 12.   Plan:  1. Elevated troponin may be related to demand ischemia - EKG with deep TWI's yesterday, concerning for possible high-grade CAD/ischemia. Unable to anticoagulate given possible GIB. Would recommend GI evaluation if concern for ongoing bleeding as this limits our ability to recommend a coronary evaluation. Would follow daily EKG's - given TWI's and QT prolongation. Consider discontinuing fluconazole as it is known to prolong QTc or consider alternative anti-fungal agent.  Time Spent Directly with Patient:  15 minutes  Length of Stay:    Pixie Casino, MD,  [facility] Attending Cardiologist [place] 02/13/2016, 9:20 AM

## 2016-02-14 DIAGNOSIS — J189 Pneumonia, unspecified organism: Secondary | ICD-10-CM | POA: Insufficient documentation

## 2016-02-14 LAB — BLOOD CULTURE ID PANEL (REFLEXED)
Acinetobacter baumannii: NOT DETECTED
CANDIDA KRUSEI: NOT DETECTED
CANDIDA PARAPSILOSIS: NOT DETECTED
CANDIDA TROPICALIS: NOT DETECTED
CARBAPENEM RESISTANCE: NOT DETECTED
Candida albicans: NOT DETECTED
Candida glabrata: NOT DETECTED
ENTEROBACTERIACEAE SPECIES: NOT DETECTED
ENTEROCOCCUS SPECIES: NOT DETECTED
Enterobacter cloacae complex: NOT DETECTED
Escherichia coli: NOT DETECTED
Haemophilus influenzae: NOT DETECTED
KLEBSIELLA OXYTOCA: NOT DETECTED
KLEBSIELLA PNEUMONIAE: NOT DETECTED
LISTERIA MONOCYTOGENES: NOT DETECTED
Methicillin resistance: NOT DETECTED
Neisseria meningitidis: NOT DETECTED
PROTEUS SPECIES: NOT DETECTED
Pseudomonas aeruginosa: NOT DETECTED
SERRATIA MARCESCENS: NOT DETECTED
STAPHYLOCOCCUS AUREUS BCID: NOT DETECTED
STAPHYLOCOCCUS SPECIES: NOT DETECTED
STREPTOCOCCUS AGALACTIAE: NOT DETECTED
Streptococcus pneumoniae: NOT DETECTED
Streptococcus pyogenes: NOT DETECTED
Streptococcus species: NOT DETECTED
VANCOMYCIN RESISTANCE: NOT DETECTED

## 2016-02-14 LAB — RENAL FUNCTION PANEL
ALBUMIN: 1.8 g/dL — AB (ref 3.5–5.0)
Anion gap: 13 (ref 5–15)
BUN: 104 mg/dL — AB (ref 6–20)
CALCIUM: 8.3 mg/dL — AB (ref 8.9–10.3)
CHLORIDE: 95 mmol/L — AB (ref 101–111)
CO2: 25 mmol/L (ref 22–32)
CREATININE: 4.34 mg/dL — AB (ref 0.61–1.24)
GFR, EST AFRICAN AMERICAN: 18 mL/min — AB (ref >60)
GFR, EST NON AFRICAN AMERICAN: 16 mL/min — AB (ref >60)
Glucose, Bld: 196 mg/dL — ABNORMAL HIGH (ref 65–99)
PHOSPHORUS: 5.4 mg/dL — AB (ref 2.5–4.6)
Potassium: 4.5 mmol/L (ref 3.5–5.1)
Sodium: 133 mmol/L — ABNORMAL LOW (ref 135–145)

## 2016-02-14 LAB — CBC
HCT: 29.6 % — ABNORMAL LOW (ref 39.0–52.0)
Hemoglobin: 9.2 g/dL — ABNORMAL LOW (ref 13.0–17.0)
MCH: 26.8 pg (ref 26.0–34.0)
MCHC: 31.1 g/dL (ref 30.0–36.0)
MCV: 86.3 fL (ref 78.0–100.0)
PLATELETS: 309 10*3/uL (ref 150–400)
RBC: 3.43 MIL/uL — AB (ref 4.22–5.81)
RDW: 16.7 % — ABNORMAL HIGH (ref 11.5–15.5)
WBC: 20.5 10*3/uL — AB (ref 4.0–10.5)

## 2016-02-14 LAB — VANCOMYCIN, TROUGH: VANCOMYCIN TR: 31 ug/mL — AB (ref 15–20)

## 2016-02-14 NOTE — Progress Notes (Signed)
DAILY PROGRESS NOTE  Subjective:  No events overnight. Plan for dialysis today. EKG this morning shows improvement in T wave inversions anteriorly. Remains intubated. H/H mildly reduced.  Objective: Vitals:  BP: 13762, T 98.4, P78, RR 20, SPO2 100% on vent  Medications: No current facility-administered medications for this encounter.    Physical Exam: General appearance: doesn't respond to stimulus Lungs: diminished breath sounds bilaterally and rhonchi bilaterally Heart: regular rate and rhythm Extremities: extremities normal, atraumatic, no cyanosis or edema Pulses: 2+ and symmetric Neurologic: Mental status: did not respond to stimulus today  Lab Results: Results for orders placed or performed during the hospital encounter of 02/01/16 (from the past 48 hour(s))  Blood gas, arterial     Status: Abnormal   Collection Time: 02/12/16 11:45 AM  Result Value Ref Range   FIO2 1.00    Delivery systems BILEVEL POSITIVE AIRWAY PRESSURE    Inspiratory PAP 16    Expiratory PAP 8    pH, Arterial 7.315 (L) 7.350 - 7.450   pCO2 arterial 59.0 (HH) 35.0 - 45.0 mmHg    Comment: CRITICAL RESULT CALLED TO, READ BACK BY AND VERIFIED WITH: KRISTEN DEAL, RRT AT 1158, BY TAMMIEREADLING RRT, ON 02/12/16     pO2, Arterial 76.9 (L) 80.0 - 100.0 mmHg   Bicarbonate 29.2 (H) 20.0 - 24.0 mEq/L   TCO2 31.0 0 - 100 mmol/L   Acid-Base Excess 3.5 (H) 0.0 - 2.0 mmol/L   O2 Saturation 91.9 %   Patient temperature 98.6    Collection site BRACHIAL ARTERY    Drawn by [id number]    Sample type ARTERIAL DRAW    Allens test (pass/fail) PASS PASS  Blood gas, arterial     Status: Abnormal   Collection Time: 02/12/16  1:59 PM  Result Value Ref Range   FIO2 0.35    Delivery systems VENTILATOR    Mode ASSIST CONTROL    VT 500 mL   LHR 15 resp/min   Peep/cpap 5.0 cm H20   pH, Arterial 7.508 (H) 7.350 - 7.450   pCO2 arterial 34.0 (L) 35.0 - 45.0 mmHg   pO2, Arterial 104 (H) 80.0 - 100.0 mmHg   Bicarbonate 26.8 (H) 20.0 - 24.0 mEq/L   TCO2 27.8 0 - 100 mmol/L   Acid-Base Excess 3.7 (H) 0.0 - 2.0 mmol/L   O2 Saturation 98.0 %   Patient temperature 98.6    Collection site BRACHIAL ARTERY    Drawn by [phone]    Sample type ARTERIAL DRAW   Culture, respiratory (NON-Expectorated)     Status: None (Preliminary result)   Collection Time: 02/12/16  1:59 PM  Result Value Ref Range   Specimen Description TRACHEAL ASPIRATE    Special Requests NONE    Gram Stain      ABUNDANT WBC PRESENT,BOTH PMN AND MONONUCLEAR ABUNDANT GRAM NEGATIVE RODS FEW GRAM POSITIVE COCCI IN PAIRS    Culture      MODERATE STAPHYLOCOCCUS AUREUS MODERATE GRAM NEGATIVE RODS    Report Status PENDING   Culture, blood (routine x 2)     Status: None (Preliminary result)   Collection Time: 02/12/16  4:00 PM  Result Value Ref Range   Specimen Description BLOOD RIGHT HAND    Special Requests BOTTLES DRAWN AEROBIC AND ANAEROBIC 5CC    Culture NO GROWTH < 24 HOURS    Report Status PENDING   Culture, blood (routine x 2)     Status: None (Preliminary result)   Collection Time: 02/12/16  4:07  PM  Result Value Ref Range   Specimen Description BLOOD RIGHT HAND    Special Requests BOTTLES DRAWN AEROBIC AND ANAEROBIC 5CC    Culture NO GROWTH < 24 HOURS    Report Status PENDING   Troponin I (q 6hr x 3)     Status: Abnormal   Collection Time: 02/12/16  5:17 PM  Result Value Ref Range   Troponin I 0.50 (HH) <0.03 ng/mL    Comment: CRITICAL RESULT CALLED TO, READ BACK BY AND VERIFIED WITH: K GILLISPIE,RN 1754 02/12/2016 WBOND   Hemoglobin and hematocrit, blood     Status: Abnormal   Collection Time: 02/12/16  5:17 PM  Result Value Ref Range   Hemoglobin 9.6 (L) 13.0 - 17.0 g/dL   HCT 30.7 (L) 39.0 - 52.0 %  Troponin I (q 6hr x 3)     Status: Abnormal   Collection Time: 02/13/16  5:37 AM  Result Value Ref Range   Troponin I 0.40 (HH) <0.03 ng/mL    Comment: CRITICAL VALUE NOTED.  VALUE IS CONSISTENT WITH PREVIOUSLY  REPORTED AND CALLED VALUE.  CBC     Status: Abnormal   Collection Time: 02/13/16  5:37 AM  Result Value Ref Range   WBC 14.7 (H) 4.0 - 10.5 K/uL   RBC 3.51 (L) 4.22 - 5.81 MIL/uL   Hemoglobin 9.3 (L) 13.0 - 17.0 g/dL   HCT 30.6 (L) 39.0 - 52.0 %   MCV 87.2 78.0 - 100.0 fL   MCH 26.5 26.0 - 34.0 pg   MCHC 30.4 30.0 - 36.0 g/dL   RDW 16.9 (H) 11.5 - 15.5 %   Platelets 253 150 - 400 K/uL  Basic metabolic panel     Status: Abnormal   Collection Time: 02/13/16  5:37 AM  Result Value Ref Range   Sodium 133 (L) 135 - 145 mmol/L   Potassium 4.9 3.5 - 5.1 mmol/L   Chloride 98 (L) 101 - 111 mmol/L   CO2 23 22 - 32 mmol/L   Glucose, Bld 240 (H) 65 - 99 mg/dL   BUN 70 (H) 6 - 20 mg/dL   Creatinine, Ser 3.53 (H) 0.61 - 1.24 mg/dL    Comment: DELTA CHECK NOTED   Calcium 7.7 (L) 8.9 - 10.3 mg/dL   GFR calc non Af Amer 20 (L) >60 mL/min   GFR calc Af Amer 23 (L) >60 mL/min    Comment: (NOTE) The eGFR has been calculated using the CKD EPI equation. This calculation has not been validated in all clinical situations. eGFR's persistently <60 mL/min signify possible Chronic Kidney Disease.    Anion gap 12 5 - 15  CBC     Status: Abnormal   Collection Time: 02/14/16  5:44 AM  Result Value Ref Range   WBC 20.5 (H) 4.0 - 10.5 K/uL   RBC 3.43 (L) 4.22 - 5.81 MIL/uL   Hemoglobin 9.2 (L) 13.0 - 17.0 g/dL   HCT 29.6 (L) 39.0 - 52.0 %   MCV 86.3 78.0 - 100.0 fL   MCH 26.8 26.0 - 34.0 pg   MCHC 31.1 30.0 - 36.0 g/dL   RDW 16.7 (H) 11.5 - 15.5 %   Platelets 309 150 - 400 K/uL  Renal function panel     Status: Abnormal   Collection Time: 02/14/16  5:44 AM  Result Value Ref Range   Sodium 133 (L) 135 - 145 mmol/L   Potassium 4.5 3.5 - 5.1 mmol/L   Chloride 95 (L) 101 - 111 mmol/L  CO2 25 22 - 32 mmol/L   Glucose, Bld 196 (H) 65 - 99 mg/dL   BUN 104 (H) 6 - 20 mg/dL   Creatinine, Ser 4.34 (H) 0.61 - 1.24 mg/dL   Calcium 8.3 (L) 8.9 - 10.3 mg/dL   Phosphorus 5.4 (H) 2.5 - 4.6 mg/dL   Albumin  1.8 (L) 3.5 - 5.0 g/dL   GFR calc non Af Amer 16 (L) >60 mL/min   GFR calc Af Amer 18 (L) >60 mL/min    Comment: (NOTE) The eGFR has been calculated using the CKD EPI equation. This calculation has not been validated in all clinical situations. eGFR's persistently <60 mL/min signify possible Chronic Kidney Disease.    Anion gap 13 5 - 15    Imaging: Dg Chest Port 1 View  02/12/2016  CLINICAL DATA:  Intubated.  Respiratory failure. EXAM: PORTABLE CHEST 1 VIEW COMPARISON:  Earlier today. FINDINGS: Endotracheal tube in satisfactory position. Nasogastric tube tip in the distal stomach. Right jugular catheter tip in the upper right atrium. Bilateral pleural effusions and bibasilar atelectasis without significant change. Mild scoliosis. IMPRESSION: 1. Endotracheal tube in satisfactory position. 2. Right jugular catheter tip in the upper right atrium. This could be retracted 1.5 cm to place it at the cavoatrial junction. 3. Stable bilateral pleural effusions and bibasilar atelectasis with possible pneumonia. Electronically Signed   By: Claudie Revering M.D.   On: 02/12/2016 13:19   Dg Chest Port 1 View  02/12/2016  CLINICAL DATA:  Acute respiratory failure EXAM: PORTABLE CHEST 1 VIEW COMPARISON:  02/09/2016 FINDINGS: Cardiac shadow remains enlarged. A right jugular central line is again seen. Endotracheal tube is now noted with the tip approximately 5 cm above the carina. Nasogastric catheter is seen within the stomach. Bilateral pleural effusions are again seen. Patchy changes in the bases bilaterally although improved aeration is noted. No bony abnormality is seen. IMPRESSION: Tubes and lines as described above. Stable bilateral pleural effusions. Bibasilar infiltrates with improved aeration when compared with the prior exam. Electronically Signed   By: Inez Catalina M.D.   On: 02/12/2016 12:45    Assessment:  Principal Problem:   Acute on chronic respiratory failure (HCC) Active Problems:    Accelerated hypertension   Sepsis ([place])   ESRD on dialysis ([place])   COPD (chronic obstructive pulmonary disease) (HCC)   Acute on chronic renal failure (HCC)   Pleural effusion   Elevated troponin   CHF (congestive heart failure) (HCC)   Plan:  Elevated troponin may be related to demand ischemia- T waves improved today. QTc improved as well to 479 msec. Since hematocrit has been stable and no evidence for ongoing bleeding, would restart aspirin today. D/W Dr. Laren Everts - will restart heparin tomorrow.  Time Spent Directly with Patient: 15 minutes  Length of Stay:    Pixie Casino, MD, [facility] Attending Cardiologist [place] 02/14/2016, 11:06 AM

## 2016-02-14 NOTE — Progress Notes (Signed)
Name: Tanner Campbell MRN: [id number] DOB: [birth year]    ADMISSION DATE:  02/01/2016 CONSULTATION DATE:  02/05/16  REFERRING MD :  Dr. Sharyon Medicus / [facility]   CHIEF COMPLAINT:  Acute Respiratory Failure   HISTORY OF PRESENT ILLNESS:  40 y/o M with PMH of DM II, HTN, HLD, ESRD on HD (T, Th, S), COPD and prior back surgery who initially was admitted to [facility] from 6/21 - 6/29 for acute respiratory failure in the setting of volume overload.  He was admitted by the hospitalist service for further evaluation.  He was treated with BiPAP on admit.  Unfortunately, he decompensated 6/23 and required intubation.  The patient was found to have H. Influenza positive sputum and treated with rocephin.  CXR demonstrated a large right pleural effusion and underwent a thoracentesis (1L serous fluid removed, likely exudative by protein). He had difficulty with weaning and was transferred to [facility] on 6/29 for further evaluation / ventilator weaning.  The patient developed fever and antibiotics were expanded to vancomycin, cefepime and diflucan. He has progressed to weaning on PSV x 4 hours as of 7/3.  He was teetering since self extubation 7/5.Since that time has required BiPAP off and on. After HD 7/10  his mental status worsened and he became minimally responsive. CXR consistent with RLL opacification.  SUBJECTIVE:  Remains on vent, very agitated with SBT and sedation was restarted.   VITAL SIGNS: 90, 15, 105/86, 96%  PHYSICAL EXAMINATION:  General:  Chronically ill appearing male in NAD on Vent Neuro:  Sedated on vent HEENT:  ETT, MM pink/moist, no jvd  Cardiovascular:  s1s2 rrr,  3/6 SEM  Lungs:  Even/non-labored, lungs bilaterally coarse Abdomen:  Soft, non-tender, BSx4 active, Tolerating TF Musculoskeletal:  No acute deformities  Skin:  Warm/dry, no edema, multiple scattered scars on BLE's    Recent Labs Lab 02/12/16 0607 02/13/16 0537 02/14/16 0544  NA 133* 133* 133*  K 5.0 4.9 4.5  CL 94* 98* 95*  CO2  21* 23 25  BUN 155* 70* 104*  CREATININE 5.53* 3.53* 4.34*  GLUCOSE 349* 240* 196*    Recent Labs Lab 02/11/16 0600 02/12/16 1717 02/13/16 0537 02/14/16 0544  HGB 10.2* 9.6* 9.3* 9.2*  HCT 32.7* 30.7* 30.6* 29.6*  WBC 18.9*  --  14.7* 20.5*  PLT 404*  --  253 309   Dg Chest Port 1 View  02/12/2016  CLINICAL DATA:  Intubated.  Respiratory failure. EXAM: PORTABLE CHEST 1 VIEW COMPARISON:  Earlier today. FINDINGS: Endotracheal tube in satisfactory position. Nasogastric tube tip in the distal stomach. Right jugular catheter tip in the upper right atrium. Bilateral pleural effusions and bibasilar atelectasis without significant change. Mild scoliosis. IMPRESSION: 1. Endotracheal tube in satisfactory position. 2. Right jugular catheter tip in the upper right atrium. This could be retracted 1.5 cm to place it at the cavoatrial junction. 3. Stable bilateral pleural effusions and bibasilar atelectasis with possible pneumonia. Electronically Signed   By: Beckie Salts M.D.   On: 02/12/2016 13:19   Dg Chest Port 1 View  02/12/2016  CLINICAL DATA:  Acute respiratory failure EXAM: PORTABLE CHEST 1 VIEW COMPARISON:  02/09/2016 FINDINGS: Cardiac shadow remains enlarged. A right jugular central line is again seen. Endotracheal tube is now noted with the tip approximately 5 cm above the carina. Nasogastric catheter is seen within the stomach. Bilateral pleural effusions are again seen. Patchy changes in the bases bilaterally although improved aeration is noted. No bony abnormality is seen. IMPRESSION: Tubes and lines  as described above. Stable bilateral pleural effusions. Bibasilar infiltrates with improved aeration when compared with the prior exam. Electronically Signed   By: Alcide Clever M.D.   On: 02/12/2016 12:45   SIGNIFICANT EVENTS  6/21 - 6/29  Admit to [facility] for acute respiratory failure secondary to volume overload 6/29  Transfer to [facility]  7/03  PCCM consulted for evaluation of resp  fx 7/10- intubated for progressive respiratory failure.  STUDIES:  7/02  CXR >> stable bibasilar infiltrates  ANTIBIOTICS: Vanco 7/2 >>  Cefepime 7/2 >>  Diflucan 7/2 >>   CULTURES:  UC 7/2 >>  BCx2 7/2 >>  Sputum 7/2 >>   ASSESSMENT / PLAN:   Discussion:  40 y/o M with PMH of HTN, DM II, ESRD and medical non-compliance with recent admit 6/21-6/29 for H. Flu PNA, treated with rocephin, R pleural effusion s/p thora and respiratory failure requiring intubation (6/23).  Prolonged respiratory failure with poor weaning thought related to HCAP + volume overload.  He self extuabted in [facility] and did ok initially, but eveuntually decompensated requiring intubation 7/10.  Acute Respiratory Failure - secondary to HCAP (H. Flu) vs aspiration of TF. Volume overload less likely COPD without acute exacerbation Right Pleural Effusion - s/p thora 1L removed, likely exudative by protein ESRD  HTN   Plan: Full vent support Titrate O2 for sat of 90-95% Continue ABX per [facility] Negative balance as able with HD, currently undergoing HD. Will attempt to wean to PS after HD today Follow repeat cultures Scheduled and PRN bronchodilators Sedation per [facility] protocol Consider CT chest to better evaluate effusions/infiltrate CXR intermittently VAP prevention bundle per [facility]  Critical care time 35 mins  Joneen Roach, AGACNP-BC [facility] Pulmonology/Critical Care Pager [phone] or [phone]

## 2016-02-14 NOTE — Progress Notes (Signed)
[facility]  ROUNDING NOTE   Subjective:  Patient remains on the ventilator. FiO2 down to 28%. Due for hemodialysis again today.   Objective:  Vital signs in last 24 hours:  Temperature 97.6 pulse 80 respirations 18 blood pressure 124/64     Physical Exam: General: Critically ill appearing  Head: [place]/AT ETT and NG in place  Eyes: Anicteric  Neck: Supple, trachea midline  Lungs:  Scattered rhonchi, vent assisted  Heart: S1S2, 3/6 systolic ejection murmur  Abdomen:  Soft, nontender. BS present  Extremities: No peripheral edema.  Neurologic: On the ventilator, not following commands  Skin: No lesions  Access: Left arm AVF good thrill    Basic Metabolic Panel:  Recent Labs Lab 02/09/16 0638 02/11/16 0600 02/12/16 0607 02/13/16 0537 02/14/16 0544  NA 137 133* 133* 133* 133*  K 4.1 5.1 5.0 4.9 4.5  CL 97* 95* 94* 98* 95*  CO2 25 23 21* 23 25  GLUCOSE 35* 281* 349* 240* 196*  BUN 102* 122* 155* 70* 104*  CREATININE 4.01* 4.90* 5.53* 3.53* 4.34*  CALCIUM 8.7* 8.3* 8.1* 7.7* 8.3*  PHOS 5.0*  --  7.5*  --  5.4*    Liver Function Tests:  Recent Labs Lab 02/09/16 0638 02/11/16 0600 02/12/16 0607 02/14/16 0544  AST  --  42*  --   --   ALT  --  50  --   --   ALKPHOS  --  281*  --   --   BILITOT  --  1.4*  --   --   PROT  --  5.2*  --   --   ALBUMIN 2.2* 2.1* 2.1* 1.8*   No results for input(s): LIPASE, AMYLASE in the last 168 hours. No results for input(s): AMMONIA in the last 168 hours.  CBC:  Recent Labs Lab 02/09/16 0638 02/10/16 0430 02/11/16 0600 02/12/16 1717 02/13/16 0537 02/14/16 0544  WBC 34.8* 15.6* 18.9*  --  14.7* 20.5*  NEUTROABS  --   --  17.9*  --   --   --   HGB 12.2* 10.0* 10.2* 9.6* 9.3* 9.2*  HCT 38.6* 32.4* 32.7* 30.7* 30.6* 29.6*  MCV 86.0 85.3 87.2  --  87.2 86.3  PLT 308 233 404*  --  253 309    Cardiac Enzymes:  Recent Labs Lab 02/09/16 1252 02/09/16 2110 02/10/16 0200 02/10/16 1017 02/12/16 1717  02/13/16 0537  CKTOTAL 72  --  28*  --   --   --   CKMB 11.4*  --  8.5*  --   --   --   TROPONINI 0.25* 0.30* 0.27* 0.19* 0.50* 0.40*    BNP: Invalid input(s): POCBNP  CBG: No results for input(s): GLUCAP in the last 168 hours.  Microbiology: Results for orders placed or performed during the hospital encounter of 02/01/16  Culture, blood (routine x 2)     Status: None   Collection Time: 02/04/16  2:00 PM  Result Value Ref Range Status   Specimen Description BLOOD PICC LINE  Final   Special Requests BOTTLES DRAWN AEROBIC AND ANAEROBIC 10CC  Final   Culture NO GROWTH 6 DAYS  Final   Report Status 02/10/2016 FINAL  Final  Culture, blood (routine x 2)     Status: None   Collection Time: 02/04/16  2:17 PM  Result Value Ref Range Status   Specimen Description BLOOD BLOOD RIGHT HAND  Final   Special Requests BOTTLES DRAWN AEROBIC AND ANAEROBIC 5CC  Final  Culture NO GROWTH 6 DAYS  Final   Report Status 02/10/2016 FINAL  Final  Culture, Urine     Status: None   Collection Time: 02/04/16  2:28 PM  Result Value Ref Range Status   Specimen Description URINE, RANDOM  Final   Special Requests NONE  Final   Culture NO GROWTH  Final   Report Status 02/05/2016 FINAL  Final  Culture, respiratory (NON-Expectorated)     Status: None   Collection Time: 02/04/16  3:00 PM  Result Value Ref Range Status   Specimen Description TRACHEAL ASPIRATE  Final   Special Requests NONE  Final   Gram Stain   Final    MODERATE WBC PRESENT, PREDOMINANTLY PMN RARE SQUAMOUS EPITHELIAL CELLS PRESENT FEW GRAM POSITIVE RODS RARE GRAM POSITIVE COCCI IN PAIRS RARE GRAM NEGATIVE RODS    Culture Consistent with normal respiratory flora.  Final   Report Status 02/06/2016 FINAL  Final  Culture, respiratory (NON-Expectorated)     Status: None (Preliminary result)   Collection Time: 02/12/16  1:59 PM  Result Value Ref Range Status   Specimen Description TRACHEAL ASPIRATE  Final   Special Requests NONE  Final    Gram Stain   Final    ABUNDANT WBC PRESENT,BOTH PMN AND MONONUCLEAR ABUNDANT GRAM NEGATIVE RODS FEW GRAM POSITIVE COCCI IN PAIRS    Culture   Final    MODERATE STAPHYLOCOCCUS AUREUS MODERATE GRAM NEGATIVE RODS    Report Status PENDING  Incomplete  Culture, blood (routine x 2)     Status: None (Preliminary result)   Collection Time: 02/12/16  4:00 PM  Result Value Ref Range Status   Specimen Description BLOOD RIGHT HAND  Final   Special Requests BOTTLES DRAWN AEROBIC AND ANAEROBIC 5CC  Final   Culture NO GROWTH < 24 HOURS  Final   Report Status PENDING  Incomplete  Culture, blood (routine x 2)     Status: None (Preliminary result)   Collection Time: 02/12/16  4:07 PM  Result Value Ref Range Status   Specimen Description BLOOD RIGHT HAND  Final   Special Requests BOTTLES DRAWN AEROBIC AND ANAEROBIC 5CC  Final   Culture NO GROWTH < 24 HOURS  Final   Report Status PENDING  Incomplete    Coagulation Studies: No results for input(s): LABPROT, INR in the last 72 hours.  Urinalysis: No results for input(s): COLORURINE, LABSPEC, PHURINE, GLUCOSEU, HGBUR, BILIRUBINUR, KETONESUR, PROTEINUR, UROBILINOGEN, NITRITE, LEUKOCYTESUR in the last 72 hours.  Invalid input(s): APPERANCEUR    Imaging: Dg Chest Port 1 View  02/12/2016  CLINICAL DATA:  Intubated.  Respiratory failure. EXAM: PORTABLE CHEST 1 VIEW COMPARISON:  Earlier today. FINDINGS: Endotracheal tube in satisfactory position. Nasogastric tube tip in the distal stomach. Right jugular catheter tip in the upper right atrium. Bilateral pleural effusions and bibasilar atelectasis without significant change. Mild scoliosis. IMPRESSION: 1. Endotracheal tube in satisfactory position. 2. Right jugular catheter tip in the upper right atrium. This could be retracted 1.5 cm to place it at the cavoatrial junction. 3. Stable bilateral pleural effusions and bibasilar atelectasis with possible pneumonia. Electronically Signed   By: Beckie Salts M.D.    On: 02/12/2016 13:19   Dg Chest Port 1 View  02/12/2016  CLINICAL DATA:  Acute respiratory failure EXAM: PORTABLE CHEST 1 VIEW COMPARISON:  02/09/2016 FINDINGS: Cardiac shadow remains enlarged. A right jugular central line is again seen. Endotracheal tube is now noted with the tip approximately 5 cm above the carina. Nasogastric catheter is seen within  the stomach. Bilateral pleural effusions are again seen. Patchy changes in the bases bilaterally although improved aeration is noted. No bony abnormality is seen. IMPRESSION: Tubes and lines as described above. Stable bilateral pleural effusions. Bibasilar infiltrates with improved aeration when compared with the prior exam. Electronically Signed   By: Alcide Clever M.D.   On: 02/12/2016 12:45     Medications:       Assessment/ Plan:  Mr. Randal Goens is a 40 y.o. white male with End-stage renal disease with hemodialysis Started hemodialysis May 02, 2014, Hypertension, Diabetes mellitus type 2, hyperlipidemia, Multiple sclerosis, COPD, Back surgery, Cholecystectomy, Tobacco use, acute respiratory failure 01/2016 originally at [facility] then transitioned to [facility]  TTS [facility]. left arm AVF  1. End Stage Renal Disease: with pulmonary edema and hyperkalemia.  - hemodialysis planned again today. Ultrafiltration target 2.5-3 kg. Next ulcers on Friday.  2. Acute resp failure - multifactorial - pulm edema, possible underlying pneumonia, COPD exacerbation - self extubated 02/07/2016 - Reintubated 7/101/7 due to hypercarbia, hypoxemia. -  Remains on the ventilator at the moment. Continue weaning efforts.l.  3. Anemia of chronic kidney disease: hemoglobin down to 9.2 now. We will start the patient on Aranesp per pharmacy protocol.  4. Secondary hyperparathyroidism. Phosphorus down to 5.4 an acceptable. Continue to monitor.  LOS:  Shahab Polhamus 7/12/201710:23 AM

## 2016-02-15 ENCOUNTER — Other Ambulatory Visit (HOSPITAL_BASED_OUTPATIENT_CLINIC_OR_DEPARTMENT_OTHER): Payer: Medicare Other

## 2016-02-15 DIAGNOSIS — I319 Disease of pericardium, unspecified: Secondary | ICD-10-CM

## 2016-02-15 DIAGNOSIS — R012 Other cardiac sounds: Secondary | ICD-10-CM

## 2016-02-15 LAB — CULTURE, RESPIRATORY

## 2016-02-15 LAB — BASIC METABOLIC PANEL
ANION GAP: 11 (ref 5–15)
BUN: 56 mg/dL — AB (ref 6–20)
CALCIUM: 7.9 mg/dL — AB (ref 8.9–10.3)
CO2: 26 mmol/L (ref 22–32)
CREATININE: 2.87 mg/dL — AB (ref 0.61–1.24)
Chloride: 94 mmol/L — ABNORMAL LOW (ref 101–111)
GFR calc Af Amer: 30 mL/min — ABNORMAL LOW (ref >60)
GFR, EST NON AFRICAN AMERICAN: 26 mL/min — AB (ref >60)
GLUCOSE: 248 mg/dL — AB (ref 65–99)
Potassium: 3.7 mmol/L (ref 3.5–5.1)
Sodium: 131 mmol/L — ABNORMAL LOW (ref 135–145)

## 2016-02-15 LAB — CBC
HEMATOCRIT: 28.6 % — AB (ref 39.0–52.0)
Hemoglobin: 8.7 g/dL — ABNORMAL LOW (ref 13.0–17.0)
MCH: 26.6 pg (ref 26.0–34.0)
MCHC: 30.4 g/dL (ref 30.0–36.0)
MCV: 87.5 fL (ref 78.0–100.0)
PLATELETS: 248 10*3/uL (ref 150–400)
RBC: 3.27 MIL/uL — ABNORMAL LOW (ref 4.22–5.81)
RDW: 16.7 % — AB (ref 11.5–15.5)
WBC: 16.7 10*3/uL — AB (ref 4.0–10.5)

## 2016-02-15 LAB — CULTURE, RESPIRATORY W GRAM STAIN

## 2016-02-15 LAB — ECHOCARDIOGRAM LIMITED: Weight: 2342.17 oz

## 2016-02-15 NOTE — Progress Notes (Signed)
  DAILY PROGRESS NOTE  Subjective:  Dialysis yesterday. Pulmonary has evaluated to help with weaning. Aspirin started yesterday. Plan was to start heparin today per Dr. Hijazi.   Objective: Vitals:  BP: 142/65, T 97.4, P107, RR 22, SPO2 98% on vent  Medications: No current facility-administered medications for this encounter.    Physical Exam: General appearance: awakens to stimulus, opens eyes Lungs: diminished breath sounds bilaterally and rhonchi bilaterally Heart: regular rate and rhythm, systolic murmur: early systolic 3/6, blowing at 2nd right intercostal space and friction rub heard over the aortic listening area Extremities: extremities normal, atraumatic, no cyanosis or edema Pulses: 2+ and symmetric Neurologic: Mental status: opens eyes to stimulus today  Lab Results: Results for orders placed or performed during the hospital encounter of 02/01/16 (from the past 48 hour(s))  CBC     Status: Abnormal   Collection Time: 02/14/16  5:44 AM  Result Value Ref Range   WBC 20.5 (H) 4.0 - 10.5 K/uL   RBC 3.43 (L) 4.22 - 5.81 MIL/uL   Hemoglobin 9.2 (L) 13.0 - 17.0 g/dL   HCT 29.6 (L) 39.0 - 52.0 %   MCV 86.3 78.0 - 100.0 fL   MCH 26.8 26.0 - 34.0 pg   MCHC 31.1 30.0 - 36.0 g/dL   RDW 16.7 (H) 11.5 - 15.5 %   Platelets 309 150 - 400 K/uL  Renal function panel     Status: Abnormal   Collection Time: 02/14/16  5:44 AM  Result Value Ref Range   Sodium 133 (L) 135 - 145 mmol/L   Potassium 4.5 3.5 - 5.1 mmol/L   Chloride 95 (L) 101 - 111 mmol/L   CO2 25 22 - 32 mmol/L   Glucose, Bld 196 (H) 65 - 99 mg/dL   BUN 104 (H) 6 - 20 mg/dL   Creatinine, Ser 4.34 (H) 0.61 - 1.24 mg/dL   Calcium 8.3 (L) 8.9 - 10.3 mg/dL   Phosphorus 5.4 (H) 2.5 - 4.6 mg/dL   Albumin 1.8 (L) 3.5 - 5.0 g/dL   GFR calc non Af Amer 16 (L) >60 mL/min   GFR calc Af Amer 18 (L) >60 mL/min    Comment: (NOTE) The eGFR has been calculated using the CKD EPI equation. This calculation has not been  validated in all clinical situations. eGFR's persistently <60 mL/min signify possible Chronic Kidney Disease.    Anion gap 13 5 - 15  Vancomycin, trough     Status: Abnormal   Collection Time: 02/14/16 12:25 PM  Result Value Ref Range   Vancomycin Tr 31 (HH) 15 - 20 ug/mL    Comment: CRITICAL RESULT CALLED TO, READ BACK BY AND VERIFIED WITH: J.FURIGAY,RN 1309 1310 02/14/16 CLARK,S   CBC     Status: Abnormal   Collection Time: 02/15/16  6:37 AM  Result Value Ref Range   WBC 16.7 (H) 4.0 - 10.5 K/uL   RBC 3.27 (L) 4.22 - 5.81 MIL/uL   Hemoglobin 8.7 (L) 13.0 - 17.0 g/dL   HCT 28.6 (L) 39.0 - 52.0 %   MCV 87.5 78.0 - 100.0 fL   MCH 26.6 26.0 - 34.0 pg   MCHC 30.4 30.0 - 36.0 g/dL   RDW 16.7 (H) 11.5 - 15.5 %   Platelets 248 150 - 400 K/uL  Basic metabolic panel     Status: Abnormal   Collection Time: 02/15/16  6:37 AM  Result Value Ref Range   Sodium 131 (L) 135 - 145 mmol/L   Potassium 3.7   3.5 - 5.1 mmol/L   Chloride 94 (L) 101 - 111 mmol/L   CO2 26 22 - 32 mmol/L   Glucose, Bld 248 (H) 65 - 99 mg/dL   BUN 56 (H) 6 - 20 mg/dL    Comment: DELTA CHECK NOTED   Creatinine, Ser 2.87 (H) 0.61 - 1.24 mg/dL    Comment: DELTA CHECK NOTED DIALYSIS    Calcium 7.9 (L) 8.9 - 10.3 mg/dL   GFR calc non Af Amer 26 (L) >60 mL/min   GFR calc Af Amer 30 (L) >60 mL/min    Comment: (NOTE) The eGFR has been calculated using the CKD EPI equation. This calculation has not been validated in all clinical situations. eGFR's persistently <60 mL/min signify possible Chronic Kidney Disease.    Anion gap 11 5 - 15    Imaging: No results found.  Assessment:  Principal Problem:   Acute on chronic respiratory failure (HCC) Active Problems:   Accelerated hypertension   Sepsis ([place])   ESRD on dialysis ([place])   COPD (chronic obstructive pulmonary disease) (HCC)   Acute on chronic renal failure (HCC)   Pleural effusion   Elevated troponin   CHF (congestive heart failure) (HCC)   PNA  (pneumonia)   Plan:  Ekg stable today showing inferolateral TWI's, but anterior TWI's have improved. There is a loud multi-component friction rub and systolic murmur at the RUSB. I would like to check a repeat limited echo today to evaluate this. Consider heparin today, although H/H again slightly declined to 9/29.  Time Spent Directly with Patient: 15 minutes  Length of Stay:    Pixie Casino, MD, [facility] Attending Cardiologist [place] 02/15/2016, 8:48 AM

## 2016-02-15 NOTE — Progress Notes (Signed)
  Echocardiogram 2D Echocardiogram has been performed.  Janalyn Harder 02/15/2016, 2:39 PM

## 2016-02-16 ENCOUNTER — Inpatient Hospital Stay (HOSPITAL_COMMUNITY): Payer: Medicare Other

## 2016-02-16 ENCOUNTER — Inpatient Hospital Stay (HOSPITAL_COMMUNITY)
Admission: AD | Admit: 2016-02-16 | Discharge: 2016-04-05 | DRG: 004 | Disposition: E | Payer: Medicare Other | Source: Other Acute Inpatient Hospital | Attending: Internal Medicine | Admitting: Internal Medicine

## 2016-02-16 ENCOUNTER — Other Ambulatory Visit (HOSPITAL_COMMUNITY): Payer: Medicare Other

## 2016-02-16 DIAGNOSIS — J9600 Acute respiratory failure, unspecified whether with hypoxia or hypercapnia: Secondary | ICD-10-CM | POA: Diagnosis not present

## 2016-02-16 DIAGNOSIS — R55 Syncope and collapse: Secondary | ICD-10-CM

## 2016-02-16 DIAGNOSIS — Y95 Nosocomial condition: Secondary | ICD-10-CM | POA: Diagnosis present

## 2016-02-16 DIAGNOSIS — N189 Chronic kidney disease, unspecified: Secondary | ICD-10-CM

## 2016-02-16 DIAGNOSIS — I5023 Acute on chronic systolic (congestive) heart failure: Secondary | ICD-10-CM | POA: Diagnosis present

## 2016-02-16 DIAGNOSIS — Z9911 Dependence on respirator [ventilator] status: Secondary | ICD-10-CM | POA: Diagnosis not present

## 2016-02-16 DIAGNOSIS — I420 Dilated cardiomyopathy: Secondary | ICD-10-CM | POA: Diagnosis present

## 2016-02-16 DIAGNOSIS — J189 Pneumonia, unspecified organism: Secondary | ICD-10-CM

## 2016-02-16 DIAGNOSIS — E1122 Type 2 diabetes mellitus with diabetic chronic kidney disease: Secondary | ICD-10-CM | POA: Diagnosis present

## 2016-02-16 DIAGNOSIS — R7881 Bacteremia: Secondary | ICD-10-CM | POA: Diagnosis not present

## 2016-02-16 DIAGNOSIS — R6520 Severe sepsis without septic shock: Secondary | ICD-10-CM | POA: Diagnosis not present

## 2016-02-16 DIAGNOSIS — N186 End stage renal disease: Secondary | ICD-10-CM | POA: Diagnosis present

## 2016-02-16 DIAGNOSIS — A048 Other specified bacterial intestinal infections: Secondary | ICD-10-CM | POA: Diagnosis present

## 2016-02-16 DIAGNOSIS — R778 Other specified abnormalities of plasma proteins: Secondary | ICD-10-CM

## 2016-02-16 DIAGNOSIS — J9801 Acute bronchospasm: Secondary | ICD-10-CM | POA: Diagnosis present

## 2016-02-16 DIAGNOSIS — E871 Hypo-osmolality and hyponatremia: Secondary | ICD-10-CM | POA: Diagnosis present

## 2016-02-16 DIAGNOSIS — E43 Unspecified severe protein-calorie malnutrition: Secondary | ICD-10-CM | POA: Diagnosis present

## 2016-02-16 DIAGNOSIS — D638 Anemia in other chronic diseases classified elsewhere: Secondary | ICD-10-CM | POA: Diagnosis present

## 2016-02-16 DIAGNOSIS — I4901 Ventricular fibrillation: Secondary | ICD-10-CM | POA: Diagnosis not present

## 2016-02-16 DIAGNOSIS — Z992 Dependence on renal dialysis: Secondary | ICD-10-CM | POA: Diagnosis not present

## 2016-02-16 DIAGNOSIS — R011 Cardiac murmur, unspecified: Secondary | ICD-10-CM | POA: Diagnosis present

## 2016-02-16 DIAGNOSIS — A419 Sepsis, unspecified organism: Secondary | ICD-10-CM | POA: Diagnosis present

## 2016-02-16 DIAGNOSIS — I3139 Other pericardial effusion (noninflammatory): Secondary | ICD-10-CM | POA: Diagnosis present

## 2016-02-16 DIAGNOSIS — I330 Acute and subacute infective endocarditis: Secondary | ICD-10-CM | POA: Diagnosis not present

## 2016-02-16 DIAGNOSIS — Z930 Tracheostomy status: Secondary | ICD-10-CM | POA: Diagnosis not present

## 2016-02-16 DIAGNOSIS — I313 Pericardial effusion (noninflammatory): Secondary | ICD-10-CM | POA: Diagnosis present

## 2016-02-16 DIAGNOSIS — F17200 Nicotine dependence, unspecified, uncomplicated: Secondary | ICD-10-CM | POA: Diagnosis present

## 2016-02-16 DIAGNOSIS — D62 Acute posthemorrhagic anemia: Secondary | ICD-10-CM | POA: Diagnosis present

## 2016-02-16 DIAGNOSIS — I248 Other forms of acute ischemic heart disease: Secondary | ICD-10-CM | POA: Diagnosis not present

## 2016-02-16 DIAGNOSIS — I351 Nonrheumatic aortic (valve) insufficiency: Secondary | ICD-10-CM | POA: Diagnosis not present

## 2016-02-16 DIAGNOSIS — I214 Non-ST elevation (NSTEMI) myocardial infarction: Secondary | ICD-10-CM | POA: Diagnosis present

## 2016-02-16 DIAGNOSIS — R519 Headache, unspecified: Secondary | ICD-10-CM

## 2016-02-16 DIAGNOSIS — I6930 Unspecified sequelae of cerebral infarction: Secondary | ICD-10-CM

## 2016-02-16 DIAGNOSIS — J90 Pleural effusion, not elsewhere classified: Secondary | ICD-10-CM | POA: Diagnosis not present

## 2016-02-16 DIAGNOSIS — I959 Hypotension, unspecified: Secondary | ICD-10-CM | POA: Diagnosis not present

## 2016-02-16 DIAGNOSIS — Z4659 Encounter for fitting and adjustment of other gastrointestinal appliance and device: Secondary | ICD-10-CM

## 2016-02-16 DIAGNOSIS — N2581 Secondary hyperparathyroidism of renal origin: Secondary | ICD-10-CM | POA: Diagnosis present

## 2016-02-16 DIAGNOSIS — G629 Polyneuropathy, unspecified: Secondary | ICD-10-CM | POA: Diagnosis present

## 2016-02-16 DIAGNOSIS — E118 Type 2 diabetes mellitus with unspecified complications: Secondary | ICD-10-CM

## 2016-02-16 DIAGNOSIS — Z880 Allergy status to penicillin: Secondary | ICD-10-CM

## 2016-02-16 DIAGNOSIS — N179 Acute kidney failure, unspecified: Secondary | ICD-10-CM

## 2016-02-16 DIAGNOSIS — J441 Chronic obstructive pulmonary disease with (acute) exacerbation: Secondary | ICD-10-CM

## 2016-02-16 DIAGNOSIS — R1310 Dysphagia, unspecified: Secondary | ICD-10-CM

## 2016-02-16 DIAGNOSIS — G35 Multiple sclerosis: Secondary | ICD-10-CM | POA: Diagnosis present

## 2016-02-16 DIAGNOSIS — J9601 Acute respiratory failure with hypoxia: Secondary | ICD-10-CM

## 2016-02-16 DIAGNOSIS — J690 Pneumonitis due to inhalation of food and vomit: Secondary | ICD-10-CM

## 2016-02-16 DIAGNOSIS — R109 Unspecified abdominal pain: Secondary | ICD-10-CM

## 2016-02-16 DIAGNOSIS — Z23 Encounter for immunization: Secondary | ICD-10-CM

## 2016-02-16 DIAGNOSIS — J9621 Acute and chronic respiratory failure with hypoxia: Secondary | ICD-10-CM | POA: Diagnosis present

## 2016-02-16 DIAGNOSIS — M545 Low back pain: Secondary | ICD-10-CM | POA: Diagnosis present

## 2016-02-16 DIAGNOSIS — A86 Unspecified viral encephalitis: Secondary | ICD-10-CM | POA: Diagnosis present

## 2016-02-16 DIAGNOSIS — Z681 Body mass index (BMI) 19 or less, adult: Secondary | ICD-10-CM | POA: Diagnosis not present

## 2016-02-16 DIAGNOSIS — M549 Dorsalgia, unspecified: Secondary | ICD-10-CM | POA: Diagnosis not present

## 2016-02-16 DIAGNOSIS — I509 Heart failure, unspecified: Secondary | ICD-10-CM

## 2016-02-16 DIAGNOSIS — E7800 Pure hypercholesterolemia, unspecified: Secondary | ICD-10-CM | POA: Diagnosis present

## 2016-02-16 DIAGNOSIS — R509 Fever, unspecified: Secondary | ICD-10-CM | POA: Diagnosis not present

## 2016-02-16 DIAGNOSIS — J440 Chronic obstructive pulmonary disease with acute lower respiratory infection: Secondary | ICD-10-CM | POA: Diagnosis present

## 2016-02-16 DIAGNOSIS — G8929 Other chronic pain: Secondary | ICD-10-CM | POA: Diagnosis present

## 2016-02-16 DIAGNOSIS — E875 Hyperkalemia: Secondary | ICD-10-CM | POA: Diagnosis not present

## 2016-02-16 DIAGNOSIS — T85598A Other mechanical complication of other gastrointestinal prosthetic devices, implants and grafts, initial encounter: Secondary | ICD-10-CM

## 2016-02-16 DIAGNOSIS — R51 Headache: Secondary | ICD-10-CM

## 2016-02-16 DIAGNOSIS — J14 Pneumonia due to Hemophilus influenzae: Secondary | ICD-10-CM | POA: Diagnosis present

## 2016-02-16 DIAGNOSIS — D631 Anemia in chronic kidney disease: Secondary | ICD-10-CM | POA: Diagnosis present

## 2016-02-16 DIAGNOSIS — R012 Other cardiac sounds: Secondary | ICD-10-CM | POA: Diagnosis not present

## 2016-02-16 DIAGNOSIS — E8889 Other specified metabolic disorders: Secondary | ICD-10-CM | POA: Diagnosis present

## 2016-02-16 DIAGNOSIS — A4150 Gram-negative sepsis, unspecified: Secondary | ICD-10-CM | POA: Diagnosis present

## 2016-02-16 DIAGNOSIS — K5900 Constipation, unspecified: Secondary | ICD-10-CM | POA: Diagnosis not present

## 2016-02-16 DIAGNOSIS — I462 Cardiac arrest due to underlying cardiac condition: Secondary | ICD-10-CM | POA: Diagnosis not present

## 2016-02-16 DIAGNOSIS — G9340 Encephalopathy, unspecified: Secondary | ICD-10-CM | POA: Diagnosis present

## 2016-02-16 DIAGNOSIS — R64 Cachexia: Secondary | ICD-10-CM | POA: Diagnosis present

## 2016-02-16 DIAGNOSIS — J9690 Respiratory failure, unspecified, unspecified whether with hypoxia or hypercapnia: Secondary | ICD-10-CM

## 2016-02-16 DIAGNOSIS — I2510 Atherosclerotic heart disease of native coronary artery without angina pectoris: Secondary | ICD-10-CM | POA: Diagnosis present

## 2016-02-16 DIAGNOSIS — R7989 Other specified abnormal findings of blood chemistry: Secondary | ICD-10-CM | POA: Diagnosis not present

## 2016-02-16 DIAGNOSIS — IMO0002 Reserved for concepts with insufficient information to code with codable children: Secondary | ICD-10-CM

## 2016-02-16 DIAGNOSIS — Z0189 Encounter for other specified special examinations: Secondary | ICD-10-CM

## 2016-02-16 DIAGNOSIS — D649 Anemia, unspecified: Secondary | ICD-10-CM | POA: Diagnosis not present

## 2016-02-16 DIAGNOSIS — I132 Hypertensive heart and chronic kidney disease with heart failure and with stage 5 chronic kidney disease, or end stage renal disease: Secondary | ICD-10-CM | POA: Diagnosis present

## 2016-02-16 DIAGNOSIS — E1165 Type 2 diabetes mellitus with hyperglycemia: Secondary | ICD-10-CM | POA: Diagnosis present

## 2016-02-16 DIAGNOSIS — I319 Disease of pericardium, unspecified: Secondary | ICD-10-CM | POA: Diagnosis not present

## 2016-02-16 DIAGNOSIS — R1313 Dysphagia, pharyngeal phase: Secondary | ICD-10-CM | POA: Diagnosis present

## 2016-02-16 LAB — GLUCOSE, CAPILLARY: Glucose-Capillary: 308 mg/dL — ABNORMAL HIGH (ref 65–99)

## 2016-02-16 LAB — CBC
HCT: 26.5 % — ABNORMAL LOW (ref 39.0–52.0)
Hemoglobin: 8.4 g/dL — ABNORMAL LOW (ref 13.0–17.0)
MCH: 27.1 pg (ref 26.0–34.0)
MCHC: 31.7 g/dL (ref 30.0–36.0)
MCV: 85.5 fL (ref 78.0–100.0)
PLATELETS: 226 10*3/uL (ref 150–400)
RBC: 3.1 MIL/uL — ABNORMAL LOW (ref 4.22–5.81)
RDW: 17 % — ABNORMAL HIGH (ref 11.5–15.5)
WBC: 21.8 10*3/uL — ABNORMAL HIGH (ref 4.0–10.5)

## 2016-02-16 LAB — RENAL FUNCTION PANEL
ANION GAP: 15 (ref 5–15)
Albumin: 1.5 g/dL — ABNORMAL LOW (ref 3.5–5.0)
BUN: 87 mg/dL — ABNORMAL HIGH (ref 6–20)
CALCIUM: 7.9 mg/dL — AB (ref 8.9–10.3)
CHLORIDE: 90 mmol/L — AB (ref 101–111)
CO2: 24 mmol/L (ref 22–32)
CREATININE: 3.57 mg/dL — AB (ref 0.61–1.24)
GFR calc Af Amer: 23 mL/min — ABNORMAL LOW (ref >60)
GFR calc non Af Amer: 20 mL/min — ABNORMAL LOW (ref >60)
GLUCOSE: 274 mg/dL — AB (ref 65–99)
Phosphorus: 3.1 mg/dL (ref 2.5–4.6)
Potassium: 3.9 mmol/L (ref 3.5–5.1)
SODIUM: 129 mmol/L — AB (ref 135–145)

## 2016-02-16 LAB — APTT: aPTT: 33 seconds (ref 24–37)

## 2016-02-16 LAB — PROCALCITONIN: Procalcitonin: 11.92 ng/mL

## 2016-02-16 LAB — PROTIME-INR
INR: 1.15 (ref 0.00–1.49)
PROTHROMBIN TIME: 14.9 s (ref 11.6–15.2)

## 2016-02-16 LAB — VANCOMYCIN, RANDOM: VANCOMYCIN RM: 21

## 2016-02-16 LAB — TROPONIN I: TROPONIN I: 0.22 ng/mL — AB (ref <0.03)

## 2016-02-16 LAB — VANCOMYCIN, TROUGH: Vancomycin Tr: 18 ug/mL (ref 15–20)

## 2016-02-16 MED ORDER — SODIUM CHLORIDE 0.9 % IV SOLN
INTRAVENOUS | Status: DC
  Administered 2016-02-16 – 2016-02-25 (×3): via INTRAVENOUS

## 2016-02-16 MED ORDER — INSULIN ASPART 100 UNIT/ML ~~LOC~~ SOLN: 0.0000 [IU] | SUBCUTANEOUS | Status: DC

## 2016-02-16 MED ORDER — METOPROLOL TARTRATE 25 MG/10 ML ORAL SUSPENSION
50.0000 mg | Freq: Two times a day (BID) | ORAL | Status: DC
  Administered 2016-02-16 – 2016-02-18 (×5): 50 mg
  Filled 2016-02-16 (×6): qty 20

## 2016-02-16 MED ORDER — DIALYVITE 3000 3 MG PO TABS: 1.0000 | ORAL_TABLET | Freq: Every day | ORAL | Status: DC

## 2016-02-16 MED ORDER — FENTANYL CITRATE (PF) 100 MCG/2ML IJ SOLN
100.0000 ug | INTRAMUSCULAR | Status: DC | PRN
  Administered 2016-02-16 – 2016-02-18 (×8): 100 ug via INTRAVENOUS
  Filled 2016-02-16 (×8): qty 2

## 2016-02-16 MED ORDER — ATORVASTATIN CALCIUM 80 MG PO TABS
80.0000 mg | ORAL_TABLET | Freq: Every day | ORAL | Status: DC
  Administered 2016-02-17 – 2016-03-03 (×13): 80 mg
  Filled 2016-02-16 (×16): qty 1

## 2016-02-16 MED ORDER — PREDNISONE 5 MG/5ML PO SOLN
20.0000 mg | Freq: Every day | ORAL | Status: DC
  Administered 2016-02-17 – 2016-02-18 (×2): 20 mg
  Filled 2016-02-16 (×3): qty 20

## 2016-02-16 MED ORDER — ALBUTEROL SULFATE (2.5 MG/3ML) 0.083% IN NEBU
2.5000 mg | INHALATION_SOLUTION | RESPIRATORY_TRACT | Status: DC
  Administered 2016-02-16 – 2016-02-18 (×14): 2.5 mg via RESPIRATORY_TRACT
  Filled 2016-02-16 (×14): qty 3

## 2016-02-16 MED ORDER — INSULIN GLARGINE 100 UNIT/ML ~~LOC~~ SOLN
10.0000 [IU] | Freq: Two times a day (BID) | SUBCUTANEOUS | Status: DC
  Administered 2016-02-16 – 2016-02-18 (×5): 10 [IU] via SUBCUTANEOUS
  Filled 2016-02-16 (×8): qty 0.1

## 2016-02-16 MED ORDER — FENTANYL CITRATE (PF) 100 MCG/2ML IJ SOLN
100.0000 ug | INTRAMUSCULAR | Status: DC | PRN
  Administered 2016-02-18: 100 ug via INTRAVENOUS
  Filled 2016-02-16 (×2): qty 2

## 2016-02-16 MED ORDER — RENA-VITE PO TABS
1.0000 | ORAL_TABLET | Freq: Every day | ORAL | Status: DC
  Administered 2016-02-16 – 2016-03-21 (×31): 1 via ORAL
  Filled 2016-02-16 (×32): qty 1

## 2016-02-16 MED ORDER — ASPIRIN 81 MG PO CHEW
81.0000 mg | CHEWABLE_TABLET | ORAL | Status: AC
  Administered 2016-02-16: 81 mg
  Filled 2016-02-16: qty 1

## 2016-02-16 MED ORDER — ASPIRIN 300 MG RE SUPP: 300.0000 mg | RECTAL | Status: AC

## 2016-02-16 MED ORDER — INSULIN ASPART 100 UNIT/ML ~~LOC~~ SOLN
0.0000 [IU] | SUBCUTANEOUS | Status: DC
Start: 2016-02-16 — End: 2016-03-03
  Administered 2016-02-16: 3 [IU] via SUBCUTANEOUS
  Administered 2016-02-16: 5 [IU] via SUBCUTANEOUS
  Administered 2016-02-17: 1 [IU] via SUBCUTANEOUS
  Administered 2016-02-17: 2 [IU] via SUBCUTANEOUS
  Administered 2016-02-17: 1 [IU] via SUBCUTANEOUS
  Administered 2016-02-17 (×2): 2 [IU] via SUBCUTANEOUS
  Administered 2016-02-18: 5 [IU] via SUBCUTANEOUS
  Administered 2016-02-18: 3 [IU] via SUBCUTANEOUS
  Administered 2016-02-18: 2 [IU] via SUBCUTANEOUS
  Administered 2016-02-18: 5 [IU] via SUBCUTANEOUS
  Administered 2016-02-19 – 2016-02-21 (×2): 2 [IU] via SUBCUTANEOUS
  Administered 2016-02-21: 1 [IU] via SUBCUTANEOUS
  Administered 2016-02-21 (×2): 2 [IU] via SUBCUTANEOUS
  Administered 2016-02-21: 1 [IU] via SUBCUTANEOUS
  Administered 2016-02-21 (×2): 2 [IU] via SUBCUTANEOUS
  Administered 2016-02-22 (×3): 1 [IU] via SUBCUTANEOUS
  Administered 2016-02-22: 2 [IU] via SUBCUTANEOUS
  Administered 2016-02-23 (×2): 1 [IU] via SUBCUTANEOUS
  Administered 2016-02-24: 3 [IU] via SUBCUTANEOUS
  Administered 2016-02-24 (×2): 2 [IU] via SUBCUTANEOUS
  Administered 2016-02-25: 3 [IU] via SUBCUTANEOUS
  Administered 2016-02-25: 2 [IU] via SUBCUTANEOUS
  Administered 2016-02-25 (×3): 3 [IU] via SUBCUTANEOUS
  Administered 2016-02-26: 1 [IU] via SUBCUTANEOUS
  Administered 2016-02-26: 3 [IU] via SUBCUTANEOUS
  Administered 2016-02-26: 1 [IU] via SUBCUTANEOUS
  Administered 2016-02-26 (×3): 2 [IU] via SUBCUTANEOUS
  Administered 2016-02-27 – 2016-03-02 (×5): 1 [IU] via SUBCUTANEOUS
  Administered 2016-03-02: 3 [IU] via SUBCUTANEOUS
  Administered 2016-03-02: 1 [IU] via SUBCUTANEOUS

## 2016-02-16 MED ORDER — DEXTROSE 5 % IV SOLN
1.0000 g | INTRAVENOUS | Status: DC
  Administered 2016-02-16: 1 g via INTRAVENOUS
  Filled 2016-02-16 (×2): qty 1

## 2016-02-16 MED ORDER — SODIUM CHLORIDE 0.9 % IV SOLN: 250.0000 mL | INTRAVENOUS | Status: DC | PRN

## 2016-02-16 MED ORDER — PANTOPRAZOLE SODIUM 40 MG PO PACK
40.0000 mg | PACK | Freq: Two times a day (BID) | ORAL | Status: DC
  Administered 2016-02-16 – 2016-02-18 (×5): 40 mg
  Filled 2016-02-16 (×6): qty 20

## 2016-02-16 NOTE — Progress Notes (Addendum)
Pharmacy Antibiotic Note  40 y/o M with PMH of DM II, HTN, HLD, ESRD on HD (T, Th, S), COPD and prior back surgery who initially was admitted to [facility] from 6/21 - 6/29 for acute respiratory failure in the setting of volume overload. He was admitted by the hospitalist service for further evaluation. He was treated with BiPAP on admit. Unfortunately, he decompensated 6/23 and required intubation. The patient was found to have H. Influenza positive sputum and treated with rocephin. CXR demonstrated a large right pleural effusion and underwent a thoracentesis (1L serous fluid removed, likely exudative by protein). He had difficulty with weaning and was transferred to [facility] on 6/29 orally intubated for further evaluation / ventilator weaning. The patient developed fever and antibiotics were expanded to vancomycin, cefepime and diflucan. He had progressed to weaning on PSV x 4 hours as of 7/3. He self extubated on 7/5. Since that time has required BiPAP off and on. After HD 7/10 his mental status worsened and he became minimally responsive. CXR consistent with RLL opacification and he was re-intubated. He was re-cultured at this point, and Also was found to have increased Troponin for which cardiology was consulted. He was placed on LMWH and apparently had GIB while on this and his hgb dropped as low as 8.4 from 9.3. As of 7/14 he remains ventilator dependent. His sputum cultures are growing MSSA and e coli and also has positive blood cultures.   Pharmacy has been consulted to continue Vancomycin and start Cefepime Apparently he had HD earlier today 7/14, a random level this AM was reported as 18  Vancomycin random level this PM = 21   Plan: No further vancomycin doses tonight, will dose 750 mg iv after each HD session Cefepime 1 gram  iv Q 24 hours for now  Follow up HD schedule (originally [facility], now is off schedule)      No data recorded.   Recent Labs Lab 02/11/16 0600 02/12/16 0607  02/13/16 0537 02/14/16 0544 02/14/16 1225 02/15/16 0637 02/24/2016 0510  WBC 18.9*  --  14.7* 20.5*  --  16.7* 21.8*  CREATININE 4.90* 5.53* 3.53* 4.34*  --  2.87* 3.57*  VANCOTROUGH  --   --   --   --  31*  --  18    CrCl cannot be calculated (Unknown ideal weight.).    Allergies  Allergen Reactions  . Penicillins Other (See Comments)    Reaction: Unknown     Thank you for allowing pharmacy to be a part of this patient's care. Okey Regal, PharmD [phone]  02/23/2016 6:39 PM

## 2016-02-16 NOTE — Progress Notes (Signed)
[facility]  ROUNDING NOTE   Subjective:  Patient remains on the ventilator. FiO2 down to 28%. Underwent hemodialysis earlier today Total of 3000 cc was removed Blood flow rate 400/DFR 800 cc was achieved   Objective:  Vital signs in last 24 hours:  Temperature 97.9, pulse 77, respirations 22, blood pressure 153/69     Physical Exam: General: Critically ill appearing  Head: Armstrong/AT ETT and NG in place  Eyes: Anicteric  Neck: Supple, trachea midline  Lungs:  Scattered rhonchi, vent assisted  Heart: S1S2, 3/6 systolic ejection murmur  Abdomen:  Soft, nontender. BS present  Extremities: trace peripheral edema.  Neurologic: On the ventilator, not following commands  Skin: No lesions  Access: Left arm AVF good thrill    Basic Metabolic Panel:  Recent Labs Lab 02/12/16 0607 02/13/16 0537 02/14/16 0544 02/15/16 0637 05-Mar-2016 0510  NA 133* 133* 133* 131* 129*  K 5.0 4.9 4.5 3.7 3.9  CL 94* 98* 95* 94* 90*  CO2 21* 23 25 26 24   GLUCOSE 349* 240* 196* 248* 274*  BUN 155* 70* 104* 56* 87*  CREATININE 5.53* 3.53* 4.34* 2.87* 3.57*  CALCIUM 8.1* 7.7* 8.3* 7.9* 7.9*  PHOS 7.5*  --  5.4*  --  3.1    Liver Function Tests:  Recent Labs Lab 02/11/16 0600 02/12/16 0607 02/14/16 0544 03-05-16 0510  AST 42*  --   --   --   ALT 50  --   --   --   ALKPHOS 281*  --   --   --   BILITOT 1.4*  --   --   --   PROT 5.2*  --   --   --   ALBUMIN 2.1* 2.1* 1.8* 1.5*   No results for input(s): LIPASE, AMYLASE in the last 168 hours. No results for input(s): AMMONIA in the last 168 hours.  CBC:  Recent Labs Lab 02/11/16 0600 02/12/16 1717 02/13/16 0537 02/14/16 0544 02/15/16 0637 March 05, 2016 0510  WBC 18.9*  --  14.7* 20.5* 16.7* 21.8*  NEUTROABS 17.9*  --   --   --   --   --   HGB 10.2* 9.6* 9.3* 9.2* 8.7* 8.4*  HCT 32.7* 30.7* 30.6* 29.6* 28.6* 26.5*  MCV 87.2  --  87.2 86.3 87.5 85.5  PLT 404*  --  253 309 248 226    Cardiac Enzymes:  Recent Labs Lab  02/09/16 2110 02/10/16 0200 02/10/16 1017 02/12/16 1717 02/13/16 0537  CKTOTAL  --  28*  --   --   --   CKMB  --  8.5*  --   --   --   TROPONINI 0.30* 0.27* 0.19* 0.50* 0.40*    BNP: Invalid input(s): POCBNP  CBG: No results for input(s): GLUCAP in the last 168 hours.  Microbiology: Results for orders placed or performed during the hospital encounter of 02/01/16  Culture, blood (routine x 2)     Status: None   Collection Time: 02/04/16  2:00 PM  Result Value Ref Range Status   Specimen Description BLOOD PICC LINE  Final   Special Requests BOTTLES DRAWN AEROBIC AND ANAEROBIC 10CC  Final   Culture NO GROWTH 6 DAYS  Final   Report Status 02/10/2016 FINAL  Final  Culture, blood (routine x 2)     Status: None   Collection Time: 02/04/16  2:17 PM  Result Value Ref Range Status   Specimen Description BLOOD BLOOD RIGHT HAND  Final   Special Requests BOTTLES DRAWN  AEROBIC AND ANAEROBIC 5CC  Final   Culture NO GROWTH 6 DAYS  Final   Report Status 02/10/2016 FINAL  Final  Culture, Urine     Status: None   Collection Time: 02/04/16  2:28 PM  Result Value Ref Range Status   Specimen Description URINE, RANDOM  Final   Special Requests NONE  Final   Culture NO GROWTH  Final   Report Status 02/05/2016 FINAL  Final  Culture, respiratory (NON-Expectorated)     Status: None   Collection Time: 02/04/16  3:00 PM  Result Value Ref Range Status   Specimen Description TRACHEAL ASPIRATE  Final   Special Requests NONE  Final   Gram Stain   Final    MODERATE WBC PRESENT, PREDOMINANTLY PMN RARE SQUAMOUS EPITHELIAL CELLS PRESENT FEW GRAM POSITIVE RODS RARE GRAM POSITIVE COCCI IN PAIRS RARE GRAM NEGATIVE RODS    Culture Consistent with normal respiratory flora.  Final   Report Status 02/06/2016 FINAL  Final  Culture, respiratory (NON-Expectorated)     Status: None   Collection Time: 02/12/16  1:59 PM  Result Value Ref Range Status   Specimen Description TRACHEAL ASPIRATE  Final   Special  Requests NONE  Final   Gram Stain   Final    ABUNDANT WBC PRESENT,BOTH PMN AND MONONUCLEAR ABUNDANT GRAM NEGATIVE RODS FEW GRAM POSITIVE COCCI IN PAIRS    Culture   Final    MODERATE STAPHYLOCOCCUS AUREUS MODERATE ESCHERICHIA COLI    Report Status 02/15/2016 FINAL  Final   Organism ID, Bacteria ESCHERICHIA COLI  Final   Organism ID, Bacteria STAPHYLOCOCCUS AUREUS  Final      Susceptibility   Staphylococcus aureus - MIC*    CIPROFLOXACIN <=0.5 SENSITIVE Sensitive     ERYTHROMYCIN 0.5 SENSITIVE Sensitive     GENTAMICIN <=0.5 SENSITIVE Sensitive     OXACILLIN <=0.25 SENSITIVE Sensitive     TETRACYCLINE <=1 SENSITIVE Sensitive     VANCOMYCIN 1 SENSITIVE Sensitive     TRIMETH/SULFA <=10 SENSITIVE Sensitive     CLINDAMYCIN <=0.25 SENSITIVE Sensitive     RIFAMPIN <=0.5 SENSITIVE Sensitive     Inducible Clindamycin NEGATIVE Sensitive     * MODERATE STAPHYLOCOCCUS AUREUS   Escherichia coli - MIC*    AMPICILLIN >=32 RESISTANT Resistant     CEFAZOLIN <=4 SENSITIVE Sensitive     CEFEPIME <=1 SENSITIVE Sensitive     CEFTAZIDIME <=1 SENSITIVE Sensitive     CEFTRIAXONE <=1 SENSITIVE Sensitive     CIPROFLOXACIN <=0.25 SENSITIVE Sensitive     GENTAMICIN <=1 SENSITIVE Sensitive     IMIPENEM <=0.25 SENSITIVE Sensitive     TRIMETH/SULFA >=320 RESISTANT Resistant     AMPICILLIN/SULBACTAM >=32 RESISTANT Resistant     PIP/TAZO <=4 SENSITIVE Sensitive     * MODERATE ESCHERICHIA COLI  Culture, blood (routine x 2)     Status: None (Preliminary result)   Collection Time: 02/12/16  4:00 PM  Result Value Ref Range Status   Specimen Description BLOOD RIGHT HAND  Final   Special Requests BOTTLES DRAWN AEROBIC AND ANAEROBIC 5CC  Final   Culture  Setup Time   Final    GRAM NEGATIVE COCCOBACILLI ANAEROBIC BOTTLE ONLY CRITICAL RESULT CALLED TO, READ BACK BY AND VERIFIED WITH: J. Erby Pian, RN AT 1415 ON  02/14/16 BY Jamse Arn, [place].    Culture   Final    GRAM NEGATIVE COCCOBACILLI CULTURE REINCUBATED FOR  BETTER GROWTH    Report Status PENDING  Incomplete  Blood Culture ID Panel (Reflexed)  Status: None   Collection Time: 02/12/16  4:00 PM  Result Value Ref Range Status   Enterococcus species NOT DETECTED NOT DETECTED Final   Vancomycin resistance NOT DETECTED NOT DETECTED Final   Listeria monocytogenes NOT DETECTED NOT DETECTED Final   Staphylococcus species NOT DETECTED NOT DETECTED Final   Staphylococcus aureus NOT DETECTED NOT DETECTED Final   Methicillin resistance NOT DETECTED NOT DETECTED Final   Streptococcus species NOT DETECTED NOT DETECTED Final   Streptococcus agalactiae NOT DETECTED NOT DETECTED Final   Streptococcus pneumoniae NOT DETECTED NOT DETECTED Final   Streptococcus pyogenes NOT DETECTED NOT DETECTED Final   Acinetobacter baumannii NOT DETECTED NOT DETECTED Final   Enterobacteriaceae species NOT DETECTED NOT DETECTED Final   Enterobacter cloacae complex NOT DETECTED NOT DETECTED Final   Escherichia coli NOT DETECTED NOT DETECTED Final   Klebsiella oxytoca NOT DETECTED NOT DETECTED Final   Klebsiella pneumoniae NOT DETECTED NOT DETECTED Final   Proteus species NOT DETECTED NOT DETECTED Final   Serratia marcescens NOT DETECTED NOT DETECTED Final   Carbapenem resistance NOT DETECTED NOT DETECTED Final   Haemophilus influenzae NOT DETECTED NOT DETECTED Final   Neisseria meningitidis NOT DETECTED NOT DETECTED Final   Pseudomonas aeruginosa NOT DETECTED NOT DETECTED Final   Candida albicans NOT DETECTED NOT DETECTED Final   Candida glabrata NOT DETECTED NOT DETECTED Final   Candida krusei NOT DETECTED NOT DETECTED Final   Candida parapsilosis NOT DETECTED NOT DETECTED Final   Candida tropicalis NOT DETECTED NOT DETECTED Final  Culture, blood (routine x 2)     Status: None (Preliminary result)   Collection Time: 02/12/16  4:07 PM  Result Value Ref Range Status   Specimen Description BLOOD RIGHT HAND  Final   Special Requests BOTTLES DRAWN AEROBIC AND ANAEROBIC  5CC  Final   Culture  Setup Time   Final    GRAM NEGATIVE COCCOBACILLI ANAEROBIC BOTTLE ONLY CRITICAL RESULT CALLED TO, READ BACK BY AND VERIFIED WITH: J. FURIGAY, RN AT 1415 ON  02/14/16 BY Jamse Arn, MT.    Culture   Final    GRAM NEGATIVE COCCOBACILLI CULTURE REINCUBATED FOR BETTER GROWTH    Report Status PENDING  Incomplete    Coagulation Studies: No results for input(s): LABPROT, INR in the last 72 hours.  Urinalysis: No results for input(s): COLORURINE, LABSPEC, PHURINE, GLUCOSEU, HGBUR, BILIRUBINUR, KETONESUR, PROTEINUR, UROBILINOGEN, NITRITE, LEUKOCYTESUR in the last 72 hours.  Invalid input(s): APPERANCEUR    Imaging: No results found.   Medications:       Assessment/ Plan:  Mr. Kaleb Sek is a 40 y.o. white male with End-stage renal disease with hemodialysis Started hemodialysis May 02, 2014, Hypertension, Diabetes mellitus type 2, hyperlipidemia, Multiple sclerosis, COPD, Back surgery, Cholecystectomy, Tobacco use, acute respiratory failure 01/2016 originally at [facility] then transitioned to [facility]  TTS [facility]. left arm AVF  1. End Stage Renal Disease: with pulmonary edema and hyperkalemia.  - hemodialysis done earlier today. 3000cc of fluid was removed  2. Acute resp failure - multifactorial - pulm edema, COPD exacerbation - self extubated 02/07/2016 - Reintubated 7/101/7 due to hypercarbia, hypoxemia. -  Remains on the ventilator at the moment. Continue weaning efforts.  3. Anemia of chronic kidney disease: hemoglobin down to 8.4 . Aranesp  40 weekly  4. Secondary hyperparathyroidism. Phosphorus down to 3.1; acceptable. Continue to monitor.  5. Echo with worse LV function, focal inferior wall motion abnormality suggesting severe ischemia or infarct. Small pericardial effusion.cardiology team is  following Patient is being transferred to ICU at [facility]    LOS:  Tanner Campbell 07/18/173:25 PM

## 2016-02-16 NOTE — Progress Notes (Signed)
eLink Physician-Brief Progress Note Patient Name: Tanner Campbell DOB: [birth year] MRN: [id number]   Date of Service  02/10/2016  HPI/Events of Note  RN calls re: pt transfer from [facility] Was seen by Dr. Delton Coombes earlier.  Pt with CKD, intubated for resp fx. ? Aspiration. With a lot of issues at [facility] so transferred to 68M  Pt seen. Sedated. Comfortable.  130/70 88 20  100%    eICU Interventions  Per RN, pt was agitated earleir. Currently sedated. Cont prn fentanyl.   RN wanted restarints as pt tried to pull lines and was agitated.  Cont other meds.      Intervention Category Evaluation Type: Other  Jose Bridgette Habermann Dios 03/04/2016, 8:48 PM

## 2016-02-16 NOTE — Progress Notes (Signed)
Patient arrived from [facility]. VS stable. Responds to voice, confused, became more  restless and agitated. Soft restraints applied, MD called and notified (Dr. Suzan Garibaldi). Orders are being placed. Dark small spots noted throughout the lower extremities, as well as dark lesions around mouth, dark bloody-filled small blister on right wrist. MD notified as well. Bed in low position, alarms are on, call bell in reach, room near nurs station. RT assessment done, ABG taken Report to upcoming nurse given

## 2016-02-16 NOTE — Progress Notes (Signed)
Transported patient to C.T while on the vent. Patient remained stable during transport.  

## 2016-02-16 NOTE — H&P (Signed)
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 He was admitted by the hospitalist service for further evaluation. He was treated with BiPAP on admit. Unfortunately, he decompensated 6/23 and required intubation. The patient was found to have H. Influenza positive sputum and treated with rocephin. CXR demonstrated a large right pleural effusion and underwent a thoracentesis (1L serous fluid removed, likely exudative by protein). He had difficulty with weaning and was transferred to [facility] on 6/29 orally intubated for further evaluation / ventilator weaning. The patient developed fever and antibiotics were expanded to vancomycin, cefepime and diflucan. He had progressed to weaning on PSV x 4 hours as of 7/3. He self extubated on 7/5. Since that time has required BiPAP off and on. After HD 7/10 his mental status worsened and he became minimally responsive. CXR consistent with RLL opacification and he was re-intubated. He was re-cultured at this point, and Also was found to have increased Troponin for which cardiology was consulted. He was placed on LMWH and apparently had GIB while on this and his hgb dropped as low as 8.4 from 9.3. As of 7/14 he remains ventilator dependent. His sputum cultures are growing MSSA and e coli and also has positive blood cultures. Because of all of his overwhelming issues it was felt best to transfer him to a higher level of care.    PAST MEDICAL HISTORY :  He  has a past medical history of Diabetes mellitus without complication  (HCC); Hypertension; COPD (chronic obstructive pulmonary disease) (HCC); Renal disorder; Neuropathy (HCC); Hemodialysis patient (HCC); and High cholesterol.  PAST SURGICAL HISTORY: He  has past surgical history that includes Cholecystectomy; Back surgery; and Neck surgery.  Allergies  Allergen Reactions  . Penicillins Other (See Comments)    Reaction: Unknown    No current facility-administered medications on file prior to encounter.   Current Outpatient Prescriptions on File Prior to Encounter  Medication Sig  . albuterol (PROVENTIL HFA;VENTOLIN HFA) 108 (90 Base) MCG/ACT inhaler Inhale 1-2 puffs into the lungs every 6 (six) hours as needed for wheezing or shortness of breath.  Marland Kitchen amLODipine (NORVASC) 5 MG tablet Take 5 mg by mouth daily.  Marland Kitchen atorvastatin (LIPITOR) 40 MG tablet Take 40 mg by mouth every evening.  . calcium acetate (PHOSLO) 667 MG capsule Take 2,001 mg by mouth 3 (three) times daily with meals.  . carvedilol (COREG) 25 MG tablet Take 25 mg by mouth 2 (two) times daily with a meal.  . DULoxetine (CYMBALTA) 30 MG capsule Take 30 mg by mouth daily.  . Ferric Citrate (AURYXIA PO) Take 1 tablet by mouth daily.   . folic acid-vitamin b complex-vitamin c-selenium-zinc (DIALYVITE) 3 MG TABS tablet Take 1 tablet by mouth daily.  . furosemide (LASIX) 80 MG tablet Take 80 mg by mouth 2 (two) times daily.  Marland Kitchen gabapentin (NEURONTIN) 300 MG capsule Take 300 mg by mouth at bedtime.  Marland Kitchen lisinopril (PRINIVIL,ZESTRIL) 20 MG tablet Take 20 mg by mouth every evening.  . risperiDONE (RISPERDAL)  0.5 MG tablet Take 0.5 mg by mouth at bedtime.    FAMILY HISTORY:  His has no family status information on file.   SOCIAL HISTORY: He  reports that he has been smoking.  He does not have any smokeless tobacco history on file. He reports that he does not drink alcohol or use illicit drugs.  REVIEW OF SYSTEMS:   Not able   SUBJECTIVE:  Sedated   VITAL SIGNS: There were no vitals taken for this  visit.  HEMODYNAMICS:    VENTILATOR SETTINGS:    INTAKE / OUTPUT:    PHYSICAL EXAMINATION: General:  Chronically ill appearing white male, sedated on vent  Neuro:  Opens eyes to stimuli. Moves all ext  HEENT:  Orally intubated. MMM. Right IJ PICC  Cardiovascular:  Loud systolic and diastolic blowing murmur; left AVG w/ good bruit and thrill  Lungs:  Coarse and diffuse rhonchi/ diffuse tactile frem  Abdomen:  Soft, not tender + bowel sounds  Musculoskeletal:  Equal st and bulk  Skin:  Warm and dry   LABS:  BMET  Recent Labs Lab 02/14/16 0544 02/15/16 0637 02/15/2016 0510  NA 133* 131* 129*  K 4.5 3.7 3.9  CL 95* 94* 90*  CO2 25 26 24   BUN 104* 56* 87*  CREATININE 4.34* 2.87* 3.57*  GLUCOSE 196* 248* 274*    Electrolytes  Recent Labs Lab 02/12/16 0607  02/14/16 0544 02/15/16 0637 02/26/2016 0510  CALCIUM 8.1*  < > 8.3* 7.9* 7.9*  PHOS 7.5*  --  5.4*  --  3.1  < > = values in this interval not displayed.  CBC  Recent Labs Lab 02/14/16 0544 02/15/16 0637 02/03/2016 0510  WBC 20.5* 16.7* 21.8*  HGB 9.2* 8.7* 8.4*  HCT 29.6* 28.6* 26.5*  PLT 309 248 226    Coag's No results for input(s): APTT, INR in the last 168 hours.  Sepsis Markers No results for input(s): LATICACIDVEN, PROCALCITON, O2SATVEN in the last 168 hours.  ABG  Recent Labs Lab 02/09/16 1419 02/12/16 1145 02/12/16 1359  PHART 7.399 7.315* 7.508*  PCO2ART 38.7 59.0* 34.0*  PO2ART 77.2* 76.9* 104*    Liver Enzymes  Recent Labs Lab 02/11/16 0600 02/12/16 0607 02/14/16 0544 02/09/2016 0510  AST 42*  --   --   --   ALT 50  --   --   --   ALKPHOS 281*  --   --   --   BILITOT 1.4*  --   --   --   ALBUMIN 2.1* 2.1* 1.8* 1.5*    Cardiac Enzymes  Recent Labs Lab 02/10/16 1017 02/12/16 1717 02/13/16 0537  TROPONINI 0.19* 0.50* 0.40*    Glucose No results for input(s): GLUCAP in the last 168 hours.  Imaging No results found.   STUDIES:  ECHO 7/13: Left ventricle: The  cavity size was normal. There was moderate concentric hypertrophy. Systolic function was mildly to moderately reduced. The estimated ejection fraction was in the range of 40% to 45%. Diffuse hypokinesis. There is akinesis of the inferoseptal myocardium. - Aortic valve: There was trivial regurgitation.- Mitral valve: Moderately calcified annulus. - Pericardium, extracardiac: A small pericardial effusion was identified along the left ventricular free wall. There was no evidence of hemodynamic compromise.  CULTURES: BCX2 7/10:  Gm negative coccobacilli>>> resp culture 7/10: Moderate SA and E.coli (both pan sensitive)  ANTIBIOTICS: cipro 7/12>>>7/14 vanc 7/2>>> Cefepime 7/14>>> Fluconazole 7/7>>>7/17  SIGNIFICANT EVENTS:   LINES/TUBES: OETT 7/10>>> Right IJ PICC (? Insertion date)>>>  DISCUSSION: 40 year old esrd admitted initially to [facility] d/t PNA and failure to wean. Self extubated 7/5. Clinically deteriorated over the following days. Required re-intubation 7/10 w/ findings of new HCAP and bacteremia. Now being transferred to [facility] due to multiple complex on-going issues: sepsis, NSTEMI, recent GIB when placed on LMWH for MI. Also concerned about endocarditis. Will admit him to the ICU, obtain CT chest, administer abx, and eventually will need TEE. Have consulted cards and renal. May need ID assist.    ASSESSMENT / PLAN:  PULMONARY A: Acute hypoxic Respiratory failure  HCAP Bilateral pleural effusions w/ recent parapneumonic sample from right effusion  P:   Full vent support See PAD protocol  Volume removal as tolerated via HD Will need non-contrasted CT chest to better identify chest pathology   CARDIOVASCULAR A:  Sepsis  Recent NSTEMI vs demand ischemia. Also consider new endocarditis  P:  Keep even volume status  Tele monitoring  See Id section   RENAL A:   ESRD HD dependent  Hyponatremia  P:   Replace electrolytes as needed Will contact renal    GASTROINTESTINAL A:   Recent GIB on LMWH P:   Trend cbc closely  PPI via NGT  HEMATOLOGIC A:   Anemia of critical illness  P:  Trend CBC SCDs  INFECTIOUS A:   Bacteremia rule-out Endocarditis  HCAP P:   Cont vanc Change abx to vanc and cefepime   ENDOCRINE A:   DM type II w/ hyperglycemia  Secondary hyperparathyroidism  Steroid dependence  P:   ssi  Stress dose steroids-->cont taper   NEUROLOGIC A:   Acute Encephalopathy in setting of critical illness.  P:   RASS goal: 0 PAD protocol    FAMILY  - Updates: pending   - Inter-disciplinary family meet or Palliative Care meeting due by:  7/21  Simonne Martinet ACNP-BC [facility] Pulmonary/Critical Care Pager # [phone] OR # [phone] if no answer   04/04/2016, 2:10 PM  Attending Note:  I have examined patient, reviewed labs, studies and notes. I have discussed the case with Kreg Shropshire, and I agree with the data and plans as amended above.  40 yo chronically ill man, hx ESRD on HD, with acute on chronic resp failure. The course is a protracted one. Initially he had volume overload w effusion either related to volume status, H flu HCAP or both. He was unable to be extubated and transferred to [facility] for weaning. He self extubated 7/5 and tolerated this initially. Unfortunately had an decompensation 7/10 with hypercapnia, hypoxemia and obtundation, requiring reintubation. Subsequent course c/b NSTEMI, then GI bleeding. Sputum has grown MSSA and E coli, blood cx with GNR's. On my eval he is intubated, poorly responsive. BP is normal. Lungs are coarse B. He has a course systolic and loud diastolic M at the L sternal border. We will move him to [facility] ICU, continue broad abx. Check CT chest to better characterize infiltrates and pleural space. I am concerned about bacterial endocarditis - he needs either a TEE or repeat TTE, will discuss with cardiology. If evidence for effusion(s) may need thora or chest tubes.    Independent  critical care time is 50 minutes.   Levy Pupa, MD, PhD 03/29/2016, 3:25 PM [place] [facility] [phone] or if no answer [phone]

## 2016-02-16 NOTE — Progress Notes (Signed)
DAILY PROGRESS NOTE  Subjective:  Echo yesterday shows slightly worse LV function with focal inferior wall motion abnormality, which could suggest severe ischemia or infarct in this territory. There is a small pericardial effusion which is stable. EKG unchanged today.  Objective: Vitals:  BP: 142/65, T 97.4, P107, RR 22, SPO2 98% on vent   Medications: No current facility-administered medications for this encounter.    Physical Exam: General appearance: awakens to stimulus, opens eyes Lungs: diminished breath sounds bilaterally and rhonchi bilaterally Heart: regular rate and rhythm, systolic murmur: early systolic 3/6, blowing at 2nd right intercostal space and friction rub heard over the aortic listening area Extremities: extremities normal, atraumatic, no cyanosis or edema Pulses: 2+ and symmetric Neurologic: Mental status: opens eyes to stimulus today  Lab Results: Results for orders placed or performed during the hospital encounter of 02/01/16 (from the past 48 hour(s))  Vancomycin, trough     Status: Abnormal   Collection Time: 02/14/16 12:25 PM  Result Value Ref Range   Vancomycin Tr 31 (HH) 15 - 20 ug/mL    Comment: CRITICAL RESULT CALLED TO, READ BACK BY AND VERIFIED WITH: J.FURIGAY,RN 1309 1310 02/14/16 CLARK,S   CBC     Status: Abnormal   Collection Time: 02/15/16  6:37 AM  Result Value Ref Range   WBC 16.7 (H) 4.0 - 10.5 K/uL   RBC 3.27 (L) 4.22 - 5.81 MIL/uL   Hemoglobin 8.7 (L) 13.0 - 17.0 g/dL   HCT 28.6 (L) 39.0 - 52.0 %   MCV 87.5 78.0 - 100.0 fL   MCH 26.6 26.0 - 34.0 pg   MCHC 30.4 30.0 - 36.0 g/dL   RDW 16.7 (H) 11.5 - 15.5 %   Platelets 248 150 - 400 K/uL  Basic metabolic panel     Status: Abnormal   Collection Time: 02/15/16  6:37 AM  Result Value Ref Range   Sodium 131 (L) 135 - 145 mmol/L   Potassium 3.7 3.5 - 5.1 mmol/L   Chloride 94 (L) 101 - 111 mmol/L   CO2 26 22 - 32 mmol/L   Glucose, Bld 248 (H) 65 - 99 mg/dL   BUN 56 (H) 6 - 20  mg/dL    Comment: DELTA CHECK NOTED   Creatinine, Ser 2.87 (H) 0.61 - 1.24 mg/dL    Comment: DELTA CHECK NOTED DIALYSIS    Calcium 7.9 (L) 8.9 - 10.3 mg/dL   GFR calc non Af Amer 26 (L) >60 mL/min   GFR calc Af Amer 30 (L) >60 mL/min    Comment: (NOTE) The eGFR has been calculated using the CKD EPI equation. This calculation has not been validated in all clinical situations. eGFR's persistently <60 mL/min signify possible Chronic Kidney Disease.    Anion gap 11 5 - 15  CBC     Status: Abnormal   Collection Time: 02/10/2016  5:10 AM  Result Value Ref Range   WBC 21.8 (H) 4.0 - 10.5 K/uL   RBC 3.10 (L) 4.22 - 5.81 MIL/uL   Hemoglobin 8.4 (L) 13.0 - 17.0 g/dL   HCT 26.5 (L) 39.0 - 52.0 %   MCV 85.5 78.0 - 100.0 fL   MCH 27.1 26.0 - 34.0 pg   MCHC 31.7 30.0 - 36.0 g/dL   RDW 17.0 (H) 11.5 - 15.5 %   Platelets 226 150 - 400 K/uL  Renal function panel     Status: Abnormal   Collection Time: 02/09/2016  5:10 AM  Result Value Ref Range  Sodium 129 (L) 135 - 145 mmol/L   Potassium 3.9 3.5 - 5.1 mmol/L   Chloride 90 (L) 101 - 111 mmol/L   CO2 24 22 - 32 mmol/L   Glucose, Bld 274 (H) 65 - 99 mg/dL   BUN 87 (H) 6 - 20 mg/dL   Creatinine, Ser 3.57 (H) 0.61 - 1.24 mg/dL   Calcium 7.9 (L) 8.9 - 10.3 mg/dL   Phosphorus 3.1 2.5 - 4.6 mg/dL   Albumin 1.5 (L) 3.5 - 5.0 g/dL   GFR calc non Af Amer 20 (L) >60 mL/min   GFR calc Af Amer 23 (L) >60 mL/min    Comment: (NOTE) The eGFR has been calculated using the CKD EPI equation. This calculation has not been validated in all clinical situations. eGFR's persistently <60 mL/min signify possible Chronic Kidney Disease.    Anion gap 15 5 - 15  Vancomycin, trough     Status: None   Collection Time: 03/02/2016  5:10 AM  Result Value Ref Range   Vancomycin Tr 18 15 - 20 ug/mL    Imaging: No results found.  Assessment:  Principal Problem:   Acute on chronic respiratory failure (HCC) Active Problems:   Accelerated hypertension   Sepsis  ([place])   ESRD on dialysis ([place])   COPD (chronic obstructive pulmonary disease) (HCC)   Acute on chronic renal failure (HCC)   Pleural effusion   Elevated troponin   CHF (congestive heart failure) (HCC)   PNA (pneumonia)   Plan:  Echo yesterday shows slightly lower LV function with inferior WMA. Now back on aspirin. Opens eyes to voice. Did not squeeze my hand when I enquired about chest pain. Again, would consider cath ultimately if he is extubated or trached.  Cardiology will be available as needed over the weekend. Please call with questions.  Time Spent Directly with Patient: 15 minutes  Length of Stay:    Pixie Casino, MD, [facility] Attending Cardiologist [place] 03/01/2016, 8:16 AM

## 2016-02-17 DIAGNOSIS — E43 Unspecified severe protein-calorie malnutrition: Secondary | ICD-10-CM | POA: Diagnosis present

## 2016-02-17 LAB — TROPONIN I
TROPONIN I: 0.21 ng/mL — AB (ref <0.03)
TROPONIN I: 0.22 ng/mL — AB (ref <0.03)

## 2016-02-17 LAB — CBC
HCT: 28.7 % — ABNORMAL LOW (ref 39.0–52.0)
HEMOGLOBIN: 8.6 g/dL — AB (ref 13.0–17.0)
MCH: 26.3 pg (ref 26.0–34.0)
MCHC: 30 g/dL (ref 30.0–36.0)
MCV: 87.8 fL (ref 78.0–100.0)
PLATELETS: 200 10*3/uL (ref 150–400)
RBC: 3.27 MIL/uL — AB (ref 4.22–5.81)
RDW: 16.6 % — ABNORMAL HIGH (ref 11.5–15.5)
WBC: 19.5 10*3/uL — AB (ref 4.0–10.5)

## 2016-02-17 LAB — BASIC METABOLIC PANEL
ANION GAP: 12 (ref 5–15)
BUN: 55 mg/dL — ABNORMAL HIGH (ref 6–20)
CHLORIDE: 98 mmol/L — AB (ref 101–111)
CO2: 29 mmol/L (ref 22–32)
Calcium: 8.1 mg/dL — ABNORMAL LOW (ref 8.9–10.3)
Creatinine, Ser: 2.67 mg/dL — ABNORMAL HIGH (ref 0.61–1.24)
GFR, EST AFRICAN AMERICAN: 32 mL/min — AB (ref >60)
GFR, EST NON AFRICAN AMERICAN: 28 mL/min — AB (ref >60)
Glucose, Bld: 180 mg/dL — ABNORMAL HIGH (ref 65–99)
POTASSIUM: 3.9 mmol/L (ref 3.5–5.1)
SODIUM: 139 mmol/L (ref 135–145)

## 2016-02-17 LAB — PHOSPHORUS
PHOSPHORUS: 4.2 mg/dL (ref 2.5–4.6)
Phosphorus: 3.1 mg/dL (ref 2.5–4.6)
Phosphorus: 3.6 mg/dL (ref 2.5–4.6)

## 2016-02-17 LAB — MRSA PCR SCREENING: MRSA by PCR: NEGATIVE

## 2016-02-17 LAB — PROCALCITONIN: PROCALCITONIN: 11.54 ng/mL

## 2016-02-17 LAB — MAGNESIUM
Magnesium: 2.1 mg/dL (ref 1.7–2.4)
Magnesium: 1.9 mg/dL (ref 1.7–2.4)
Magnesium: 2.1 mg/dL (ref 1.7–2.4)

## 2016-02-17 LAB — HIV ANTIBODY (ROUTINE TESTING W REFLEX): HIV SCREEN 4TH GENERATION: NONREACTIVE

## 2016-02-17 MED ORDER — VITAL AF 1.2 CAL PO LIQD
1000.0000 mL | ORAL | Status: DC
  Administered 2016-02-17 – 2016-02-18 (×2): 1000 mL

## 2016-02-17 MED ORDER — VANCOMYCIN HCL IN DEXTROSE 750-5 MG/150ML-% IV SOLN
750.0000 mg | INTRAVENOUS | Status: DC
  Filled 2016-02-17: qty 150

## 2016-02-17 MED ORDER — VITAL HIGH PROTEIN PO LIQD: 1000.0000 mL | ORAL | Status: DC

## 2016-02-17 MED ORDER — ANTISEPTIC ORAL RINSE SOLUTION (CORINZ)
7.0000 mL | Freq: Four times a day (QID) | OROMUCOSAL | Status: DC
  Administered 2016-02-17 – 2016-03-21 (×111): 7 mL via OROMUCOSAL

## 2016-02-17 MED ORDER — PRO-STAT SUGAR FREE PO LIQD
30.0000 mL | Freq: Two times a day (BID) | ORAL | Status: DC
  Administered 2016-02-17: 30 mL
  Filled 2016-02-17: qty 30

## 2016-02-17 MED ORDER — DEXTROSE 5 % IV SOLN
2.0000 g | INTRAVENOUS | Status: DC
  Filled 2016-02-17: qty 2

## 2016-02-17 MED ORDER — CHLORHEXIDINE GLUCONATE 0.12% ORAL RINSE (MEDLINE KIT)
15.0000 mL | Freq: Two times a day (BID) | OROMUCOSAL | Status: DC
  Administered 2016-02-17 – 2016-03-21 (×57): 15 mL via OROMUCOSAL

## 2016-02-17 NOTE — Progress Notes (Signed)
Pharmacy Antibiotic Note  Tanner Campbell is a 40 y.o. male admitted on 02/17/2016 with pneumonia.  Pharmacy has been consulted for vancomycin and cefepime dosing. Pt is afebrile, WBC is elevated at 19.5.   Plan: - Change cefepime to 2gm QHS - Continue vanc 750mg  post-HD - F/u renal plans, C&S, clinical status and pre-HD level when appropriate  Weight: 128 lb 12 oz (58.4 kg)  Temp (24hrs), Avg:98.2 F (36.8 C), Min:97.4 F (36.3 C), Max:99 F (37.2 C)   Recent Labs Lab 02/13/16 0537 02/14/16 0544 02/14/16 1225 02/15/16 0637 02/07/2016 0510 03/04/2016 2000 02/17/16 0340  WBC 14.7* 20.5*  --  16.7* 21.8*  --  19.5*  CREATININE 3.53* 4.34*  --  2.87* 3.57*  --  2.67*  VANCOTROUGH  --   --  31*  --  18  --   --   VANCORANDOM  --   --   --   --   --  21  --     Estimated Creatinine Clearance: 30.1 mL/min (by C-G formula based on Cr of 2.67).    Allergies  Allergen Reactions  . Penicillins Other (See Comments)    Reaction: Unknown    Antimicrobials this admission: Vanc 7/2>> Cefepime 7/2>>  Dose adjustments this admission: N/A  Microbiology results: Pending  Thank you for allowing pharmacy to be a part of this patient's care.  Deantre Bourdon, Drake Leach 02/17/2016 12:38 PM

## 2016-02-17 NOTE — Consult Note (Signed)
WOC Nurse wound consult note Reason for Consult: pressure ulcer, ruptured blisters on right forearm, foot concerns, moisture dermatitis in skin folds Wound type: Pressure, shear, moisture, blisters, neuropathic Pressure Ulcer POA: Yes Measurement: Right forearm with two ruptured blisters resulting in partial thickness skin loss:  Proximal measures 1.5cm round x 0.1cm and distal measures 2cm x 3cm x 0.1cm.  Wound beds are clean, pink and moist, scant serous exudate. Right plantar foot at midfoot:  3cm x 5cm intact serum-filled blister with 1cm elevation. Right foot, 1st metatarsal head with 1cm round callous with fissure. Dry.  Left heel with 3cm x 1.5cm area of dry skin with linear fissures.  No exudate, no wound bed visible. Bilateral axillae, bilateral inguinal and pannicular skin fold are all with intertriginous dermatitis (ITD), moist, macerated tissue with musty odor. Finally, bilateral aspects of the intragluteal cleft are with 7cm x 2cm dark discoloration consistent with deep tissue pressure injury (DTPI) and each have small areas of sloughing epidermis measuring 1cm x 0.8cm x 0.1cm which is consistent with a classic presentation.  The wound bed at these areas is dry and deep red/maroon, all areas are POA. Wound bed:As described above. Drainage (amount, consistency, odor) As described above Periwound:Dry, intact, with evidence of poor hygiene. Patient is on a therapeutic mattress with low air loss feature for patients of bariatric proportions.  He is being turned side to side and there is a prophylactic sacral dressing in place. His heels are floated.  I will add bilateral Pressure redistribution heel boots (Prevalon), our house moisture barrier ointment for the DTPI since they are close to the rectum And because he has an indwelling bowel management system in place for loose stools. I will add our house antimicrobial textile for wicking dry the intertriginous skin folds in the axillae, sub  pannicular area and the bilateral inguinal areas. Silicone foam dressings are provided for the right forearm.  White petrolatum (vaseline) gauze dressing orders are provided for the bilateral feet. WOC nursing team will not follow, but will remain available to this patient, the nursing and medical teams.  Please re-consult if needed. Thanks, Ladona Mow, MSN, RN, GNP, [place]  Pager# [phone]

## 2016-02-17 NOTE — Consult Note (Signed)
[facility] Renal Consultation Note    Indication for Consultation:  Management of ESRD/hemodialysis; anemia, hypertension/volume and secondary hyperparathyroidism  HPI: Tanner Campbell is a 40 y.o. male. Mr. Ledarrius Campbell is a 40 y.o. white male with an extensive PMH significant for End-stage renal disease with hemodialysis (Started HD 05/02/14) due to Hypertension and Diabetes mellitus type 2, as well as hyperlipidemia, multiple sclerosis, COPD, DDD, Tobacco use,and acute respiratory failure 01/2016 originally admitted to [facility] then transitioned to [facility] due to continued VDRF.  He was transferred to [facility] after he was noted to have worsening systolic murmer and pericardial friction rub.  ECHO revealed decreased LV function suggestive of severe ischemia and/or infarct and is now in the MICU.  We were asked to help manage his dialysis and ESRD-related medical conditions.  He did receive HD on 02/03/2016 without incident and UF of 3 liters.   Past Medical History  Diagnosis Date  . Diabetes mellitus without complication ([place])   . Hypertension   . COPD (chronic obstructive pulmonary disease) ([place])   . Renal disorder   . Neuropathy ([place])   . Hemodialysis patient ([place])   . High cholesterol    Past Surgical History  Procedure Laterality Date  . Cholecystectomy    . Back surgery    . Neck surgery     Family History:   Family History  Problem Relation Age of Onset  . Rheum arthritis Neg Hx   . Osteoarthritis Neg Hx   . Asthma Neg Hx   . Cancer Neg Hx   . Diabetes Neg Hx    Social History:  reports that he has been smoking.  He does not have any smokeless tobacco history on file. He reports that he does not drink alcohol or use illicit drugs. Allergies  Allergen Reactions  . Penicillins Other (See Comments)    Reaction: Unknown   Prior to Admission medications   Medication Sig Start Date End Date Taking? Authorizing Provider  albuterol (PROVENTIL  HFA;VENTOLIN HFA) 108 (90 Base) MCG/ACT inhaler Inhale 1-2 puffs into the lungs every 6 (six) hours as needed for wheezing or shortness of breath.    Historical Provider, MD  amLODipine (NORVASC) 5 MG tablet Take 5 mg by mouth daily.    Historical Provider, MD  atorvastatin (LIPITOR) 40 MG tablet Take 40 mg by mouth every evening.    Historical Provider, MD  calcium acetate (PHOSLO) 667 MG capsule Take 2,001 mg by mouth 3 (three) times daily with meals.    Historical Provider, MD  carvedilol (COREG) 25 MG tablet Take 25 mg by mouth 2 (two) times daily with a meal.    Historical Provider, MD  DULoxetine (CYMBALTA) 30 MG capsule Take 30 mg by mouth daily.    Historical Provider, MD  Ferric Citrate (AURYXIA PO) Take 1 tablet by mouth daily.     Historical Provider, MD  folic acid-vitamin b complex-vitamin c-selenium-zinc (DIALYVITE) 3 MG TABS tablet Take 1 tablet by mouth daily.    Historical Provider, MD  furosemide (LASIX) 80 MG tablet Take 80 mg by mouth 2 (two) times daily.    Historical Provider, MD  gabapentin (NEURONTIN) 300 MG capsule Take 300 mg by mouth at bedtime.    Historical Provider, MD  lisinopril (PRINIVIL,ZESTRIL) 20 MG tablet Take 20 mg by mouth every evening.    Historical Provider, MD  risperiDONE (RISPERDAL) 0.5 MG tablet Take 0.5 mg by mouth at bedtime.    Historical Provider, MD   Current  Facility-Administered Medications  Medication Dose Route Frequency Provider Last Rate Last Dose  . 0.9 %  sodium chloride infusion  250 mL Intravenous PRN Erick Colace, NP      . 0.9 %  sodium chloride infusion   Intravenous Continuous Erick Colace, NP 10 mL/hr at 02/04/2016 1900    . albuterol (PROVENTIL) (2.5 MG/3ML) 0.083% nebulizer solution 2.5 mg  2.5 mg Nebulization Q4H Erick Colace, NP   2.5 mg at 02/17/16 0736  . antiseptic oral rinse solution (CORINZ)  7 mL Mouth Rinse QID Collene Gobble, MD      . atorvastatin (LIPITOR) tablet 80 mg  80 mg Per Tube q1800 Erick Colace, NP       . ceFEPIme (MAXIPIME) 1 g in dextrose 5 % 50 mL IVPB  1 g Intravenous Q24H Collene Gobble, MD   1 g at 03/04/2016 1909  . chlorhexidine gluconate (SAGE KIT) (PERIDEX) 0.12 % solution 15 mL  15 mL Mouth Rinse BID Collene Gobble, MD   15 mL at 02/17/16 0814  . feeding supplement (PRO-STAT SUGAR FREE 64) liquid 30 mL  30 mL Per Tube BID Chesley Mires, MD   30 mL at 02/17/16 1100  . feeding supplement (VITAL HIGH PROTEIN) liquid 1,000 mL  1,000 mL Per Tube Q24H Chesley Mires, MD   Stopped at 02/17/16 1100  . fentaNYL (SUBLIMAZE) injection 100 mcg  100 mcg Intravenous Q15 min PRN Erick Colace, NP      . fentaNYL (SUBLIMAZE) injection 100 mcg  100 mcg Intravenous Q2H PRN Erick Colace, NP   100 mcg at 02/17/16 1059  . insulin aspart (novoLOG) injection 0-9 Units  0-9 Units Subcutaneous Q4H Jose Shirl Harris, MD   1 Units at 02/17/16 [phone]  . insulin glargine (LANTUS) injection 10 Units  10 Units Subcutaneous BID Erick Colace, NP   10 Units at 02/17/16 0911  . metoprolol tartrate (LOPRESSOR) 25 mg/10 mL oral suspension 50 mg  50 mg Per Tube BID Erick Colace, NP   50 mg at 02/17/16 8469  . multivitamin (RENA-VIT) tablet 1 tablet  1 tablet Oral QHS Collene Gobble, MD   1 tablet at 02/05/2016 2138  . pantoprazole sodium (PROTONIX) 40 mg/20 mL oral suspension 40 mg  40 mg Per Tube BID Erick Colace, NP   40 mg at 02/17/16 0911  . predniSONE 5 MG/5ML solution 20 mg  20 mg Per Tube Q breakfast Erick Colace, NP   20 mg at 02/17/16 0813   Labs: Basic Metabolic Panel:  Recent Labs Lab 02/15/16 0637 02/07/2016 0510 02/17/16 0340 02/17/16 1030  NA 131* 129* 139  --   K 3.7 3.9 3.9  --   CL 94* 90* 98*  --   CO2 26 24 29   --   GLUCOSE 248* 274* 180*  --   BUN 56* 87* 55*  --   CREATININE 2.87* 3.57* 2.67*  --   CALCIUM 7.9* 7.9* 8.1*  --   PHOS  --  3.1 3.1 3.6   Liver Function Tests:  Recent Labs Lab 02/11/16 0600 02/12/16 0607 02/14/16 0544 02/10/2016 0510  AST 42*  --   --   --    ALT 50  --   --   --   ALKPHOS 281*  --   --   --   BILITOT 1.4*  --   --   --   PROT 5.2*  --   --   --  ALBUMIN 2.1* 2.1* 1.8* 1.5*   No results for input(s): LIPASE, AMYLASE in the last 168 hours. No results for input(s): AMMONIA in the last 168 hours. CBC:  Recent Labs Lab 02/11/16 0600  02/13/16 0537 02/14/16 0544 02/15/16 0637 02/06/2016 0510 02/17/16 0340  WBC 18.9*  --  14.7* 20.5* 16.7* 21.8* 19.5*  NEUTROABS 17.9*  --   --   --   --   --   --   HGB 10.2*  < > 9.3* 9.2* 8.7* 8.4* 8.6*  HCT 32.7*  < > 30.6* 29.6* 28.6* 26.5* 28.7*  MCV 87.2  --  87.2 86.3 87.5 85.5 87.8  PLT 404*  --  253 309 248 226 200  < > = values in this interval not displayed. Cardiac Enzymes:  Recent Labs Lab 02/12/16 1717 02/13/16 0537 02/03/2016 2000 02/17/16 0005 02/17/16 0340  TROPONINI 0.50* 0.40* 0.22* 0.21* 0.22*   CBG:  Recent Labs Lab 02/15/2016 1742  GLUCAP 308*   Iron Studies: No results for input(s): IRON, TIBC, TRANSFERRIN, FERRITIN in the last 72 hours. Studies/Results: Ct Chest Wo Contrast  02/21/2016  CLINICAL DATA:  Pneumonia. History of diabetes and hypertension. Hemodialysis patient. EXAM: CT CHEST WITHOUT CONTRAST TECHNIQUE: Multidetector CT imaging of the chest was performed following the standard protocol without IV contrast. COMPARISON:  None. FINDINGS: Cardiovascular: Cardiac enlargement. Coronary artery calcifications. Small pericardial effusion. Normal caliber thoracic aorta. Central venous catheter with tip at the cavoatrial junction. Mediastinum/Nodes: Mildly prominent lymph nodes throughout the mediastinum. Largest pretracheal nodes measuring about 15 mm diameter. The etiology is nonspecific but probably reactive. Esophagus is decompressed with an enteric tube in place. Lungs/Pleura: Patchy nodular airspace infiltrates demonstrated throughout both lungs. This could represent multifocal pneumonia, edema, or ARDS. Linear atelectasis in both lung bases. Endotracheal  tube is in place. Small amount of secretions in the dependent trachea. Small bilateral pleural effusions slightly greater on the right. Upper Abdomen: Surgical absence of the gallbladder. Calcified granulomas in spleen. Vascular calcifications. Enteric tube tip is in stomach. Musculoskeletal: Mild degenerative changes in the spine. No destructive bone lesions. IMPRESSION: Cardiac enlargement with small pericardial effusion. Small bilateral pleural effusions. Patchy nodular airspace disease throughout both lungs may indicate edema or pneumonia. Findings suggest congestive failure. Electronically Signed   By: Lucienne Capers M.D.   On: 02/22/2016 21:41   Dg Chest Port 1 View  03/04/2016  CLINICAL DATA:  40 year old male with pneumonia EXAM: PORTABLE CHEST 1 VIEW COMPARISON:  Chest radiograph dated 02/12/2016 FINDINGS: Endotracheal tube approximately 4 cm above the carina. Enteric tube courses into the left upper abdomen with tip beyond the image margin. Right IJ central line with tip over the right cardiac border in stable positioning. Stable small bilateral pleural effusions with patchy bibasilar airspace densities compatible with atelectasis/ infiltrate. No pneumothorax. Stable top-normal cardiac size. Lower cervical fusion plate and screw. No acute fracture. IMPRESSION: Small bilateral pleural effusions with bilateral mid to lower lung field airspace densities, no significantly changed since the prior study. Follow-up recommended with Support lines and tubes as described in stable positioning. Electronically Signed   By: Anner Crete M.D.   On: 02/11/2016 19:19    ROS: Review of systems not obtained due to patient factors. Physical Exam: Filed Vitals:   02/17/16 0815 02/17/16 0900 02/17/16 1000 02/17/16 1100  BP: 141/76 116/67 129/72 138/74  Pulse: 81 78 75 74  Temp:      TempSrc:      Resp:      Weight:  SpO2: 100% 98% 100% 100%      Weight change:   Intake/Output Summary (Last 24  hours) at 02/17/16 1120 Last data filed at 02/17/16 1000  Gross per 24 hour  Intake    230 ml  Output      0 ml  Net    230 ml   BP 138/74 mmHg  Pulse 74  Temp(Src) 98.4 F (36.9 C) (Oral)  Resp 20  Wt 58.4 kg (128 lb 12 oz)  SpO2 100% General appearance: intubated and not following commands Resp: occassional rhonchi Cardio: systolic murmur: holosystolic 3/6, blowing throughout the precordium and friction rub heard RUSB GI: soft, non-tender; bowel sounds normal; no masses,  no organomegaly Extremities: LUE AVF +T/B, no edema Dialysis Access: LUE AVF  Dialysis Orders: Center: [facility] on MWF .   Assessment/Plan: 1.  VDRF - transferred from [facility] due to prolonged respiratory failure.  PCCM managing 2. Heart murmer and friction rub - ECHO performed 02/12/2016 with worsened LV function and wall motion abnormality suggesting ischemia or infarct.  Unclear why murmer more pronounced and with friction rub.  Consider TEE to r/o endocarditis 3.  ESRD -  Continue with HD MWF 4.  Hypertension/volume  - stable 5.  Anemia  - on Aranesp 53mg IV weekly.  Will increase to 668m and follow iron stores 6.  Metabolic bone disease -  Will follow ca/phos 7.  Nutrition - per primary svc  JoDonetta PottsMD [facility] ([phone]/15/2017, 11:20 AM

## 2016-02-17 NOTE — Progress Notes (Signed)
Initial Nutrition Assessment  DOCUMENTATION CODES:  Severe malnutrition in context of acute illness/injury   Pt meets criteria for SEVERE MALNUTRITION in the context of ACUTE ILLNESS as evidenced by Severe Muscle wasting, mod Fat wasting.  INTERVENTION:  Initiate Vital 1.2 @ 55 ml/hr via NGT. This is goal rate  Tube feeding regimen provides 1584 kcal (101% of needs), 99 grams of protein, and 1071 ml of H2O.   Phos/mag WDL and no noted fluid overload. No need for renal formula at this time.  NUTRITION DIAGNOSIS:  Increased nutrient needs related to Severe acute illness as evidenced by estimated nutritional requirements for this condition  GOAL:  Patient will meet greater than or equal to 90% of their needs  MONITOR:  TF tolerance, Labs, Weight trends, Diet advancement, Vent status, I & O's, Skin  REASON FOR ASSESSMENT:  Consult Assessment and reccomendations for Tube feeding  ASSESSMENT:  Transfer from [facility] due to need for higher level care. 40 y/o male PMHx DM II, HTN, HLD, ESRD, COPD. admitted to [facility] from 6/21 - 6/29 for acute resp failure. h influenza positive sputum and right pleural effusion. Transferred [facility] 6/29, initially was able to wean to PSV x4 hours, but regressed and now again is vent dependant. More recent complications include GIB, sputum + cultures for MSSA/Ecoli and + blood cultures. RD consulted for assess and TF recommendations  Patient is intubated and unable to communicate.   Patient is currently intubated on ventilator support MV: 6.7 L/min Temp (24hrs), Avg:98.2 F (36.8 C), Min:97.4 F (36.3 C), Max:99 F (37.2 C)  Propofol: None at this time  Pt was seen by [facility] RD on 6/28. Per that note, pt was on VHP @ 65 and was reportedly tolerating.   Pt's abdomen is soft. He has  severe lower extremity muscle wasting. Mild-moderate elsewhere. Ribs can be easily palpated  Labs reviewed: WBC: 19.5 Mag/phos: wdl, h/h:8.6/28.7. Albumin: 1.5,  Hyperglycemic-erratic CBGs, most often 200-300 recently   Recent Labs Lab 02/15/16 0637 02/09/2016 0510 02/17/16 0340 02/17/16 1030  NA 131* 129* 139  --   K 3.7 3.9 3.9  --   CL 94* 90* 98*  --   CO2 --   BUN 56* 87* 55*  --   CREATININE 2.87* 3.57* 2.67*  --   CALCIUM 7.9* 7.9* 8.1*  --   MG  --   --  2.1 1.9  PHOS  --  3.1 3.1 3.6  GLUCOSE 248* 274* 180*  --    Diet Order:  Diet NPO time specified  Skin: DTI to sacrum, Rash to groin perineum, blister to R wrist  Last BM:  Today, incontinent  Height:  Ht Readings from Last 1 Encounters:  01/26/16  (1.651 m)   Weight:  Wt Readings from Last 1 Encounters:  02/17/16 128 lb 12 oz (58.4 kg)   Wt Readings from Last 10 Encounters:  02/17/16 128 lb 12 oz (58.4 kg)  02/01/16 146 lb 6.2 oz (66.4 kg)  12/24/15 165 lb 9.6 oz (75.116 kg)  Earliest weight was from admit to hospital on 5/19: was 168 lbs  Ideal Body Weight:  59.1 kg  BMI:  Body mass index is 21.42 kg/(m^2).  Estimated Nutritional Needs:  Kcal:  1569 kcals Protein:  99-117 (1.7-2 g/kg bw) Fluid:  PER MD  EDUCATION NEEDS:  No education needs identified at this time  Christophe Louis RD, LDN, CNSC Clinical Nutrition Pager: [phone] 02/17/2016 1:02 PM

## 2016-02-17 NOTE — Progress Notes (Signed)
PULMONARY / CRITICAL CARE MEDICINE   Name: Tanner Campbell MRN: [id number] DOB: 1975/12/27    ADMISSION DATE:  03/04/2016  REFERRING MD:  Sharyon Medicus   CHIEF COMPLAINT:  Prolonged critical illness, failure to wean, recurrent sepsis   HISTORY OF PRESENT ILLNESS:   40 y/o M with PMH of DM II, HTN, HLD, ESRD on HD (T, Th, S), COPD and prior back surgery who initially was admitted to [facility] from 6/21 - 6/29 for acute respiratory failure in the setting of volume overload. He was admitted by the hospitalist service for further evaluation. He was treated with BiPAP on admit. Unfortunately, he decompensated 6/23 and required intubation. The patient was found to have H. Influenza positive sputum and treated with rocephin. CXR demonstrated a large right pleural effusion and underwent a thoracentesis (1L serous fluid removed, likely exudative by protein). He had difficulty with weaning and was transferred to [facility] on 6/29 orally intubated for further evaluation / ventilator weaning. The patient developed fever and antibiotics were expanded to vancomycin, cefepime and diflucan. He had progressed to weaning on PSV x 4 hours as of 7/3. He self extubated on 7/5. Since that time has required BiPAP off and on. After HD 7/10 his mental status worsened and he became minimally responsive. CXR consistent with RLL opacification and he was re-intubated. He was re-cultured at this point, and Also was found to have increased Troponin for which cardiology was consulted. He was placed on LMWH and apparently had GIB while on this and his hgb dropped as low as 8.4 from 9.3. As of 7/14 he remains ventilator dependent. His sputum cultures are growing MSSA and e coli and also has positive blood cultures. Because of all of his overwhelming issues it was felt best to transfer him to a higher level of care.   SUBJECTIVE:  Tolerating some pressure support.  VITAL SIGNS: BP 116/67 mmHg  Pulse 78  Temp(Src) 98.4 F (36.9 C) (Oral)   Resp 20  Wt 128 lb 12 oz (58.4 kg)  SpO2 98%  VENTILATOR SETTINGS: Vent Mode:  [-] PSV;CPAP FiO2 (%):  [28 %] 28 % Set Rate:  [14 bmp] 14 bmp Vt Set:  [500 mL] 500 mL PEEP:  [5 cmH20] 5 cmH20 Pressure Support:  [10 cmH20] 10 cmH20 Plateau Pressure:  [21 cmH20-23 cmH20] 21 cmH20  INTAKE / OUTPUT: I/O last 3 completed shifts: In: 170 [I.V.:120; IV Piggyback:50] Out: -   PHYSICAL EXAMINATION: General:  Chronically ill appearing white male, sedated on vent  Neuro:  Opens eyes to stimuli. Moves all ext  HEENT:  Orally intubated. MMM. Right IJ PICC  Cardiovascular:  Loud systolic and diastolic blowing murmur; left AVG w/ good bruit and thrill  Lungs:  Coarse and diffuse rhonchi/ diffuse tactile frem  Abdomen:  Soft, not tender + bowel sounds  Musculoskeletal:  Equal st and bulk  Skin:  Warm and dry   LABS:  BMET  Recent Labs Lab 02/15/16 0637 03/03/2016 0510 02/17/16 0340  NA 131* 129* 139  K 3.7 3.9 3.9  CL 94* 90* 98*  CO2 26 24 29   BUN 56* 87* 55*  CREATININE 2.87* 3.57* 2.67*  GLUCOSE 248* 274* 180*    Electrolytes  Recent Labs Lab 02/14/16 0544 02/15/16 0637 03/02/2016 0510 02/17/16 0340  CALCIUM 8.3* 7.9* 7.9* 8.1*  MG  --   --   --  2.1  PHOS 5.4*  --  3.1 3.1    CBC  Recent Labs Lab 02/15/16 0637 02/07/2016 0510 02/17/16  0340  WBC 16.7* 21.8* 19.5*  HGB 8.7* 8.4* 8.6*  HCT 28.6* 26.5* 28.7*  PLT 248 226 200    Coag's  Recent Labs Lab 02/04/2016 2000  APTT 33  INR 1.15    Sepsis Markers  Recent Labs Lab 02/21/2016 2000 02/17/16 0340  PROCALCITON 11.92 11.54    ABG  Recent Labs Lab 02/12/16 1145 02/12/16 1359  PHART 7.315* 7.508*  PCO2ART 59.0* 34.0*  PO2ART 76.9* 104*    Liver Enzymes  Recent Labs Lab 02/11/16 0600 02/12/16 0607 02/14/16 0544 03/01/2016 0510  AST 42*  --   --   --   ALT 50  --   --   --   ALKPHOS 281*  --   --   --   BILITOT 1.4*  --   --   --   ALBUMIN 2.1* 2.1* 1.8* 1.5*    Cardiac  Enzymes  Recent Labs Lab 02/20/2016 2000 02/17/16 0005 02/17/16 0340  TROPONINI 0.22* 0.21* 0.22*    Glucose  Recent Labs Lab 02/22/2016 1742  GLUCAP 308*    Imaging Ct Chest Wo Contrast  02/20/2016  CLINICAL DATA:  Pneumonia. History of diabetes and hypertension. Hemodialysis patient. EXAM: CT CHEST WITHOUT CONTRAST TECHNIQUE: Multidetector CT imaging of the chest was performed following the standard protocol without IV contrast. COMPARISON:  None. FINDINGS: Cardiovascular: Cardiac enlargement. Coronary artery calcifications. Small pericardial effusion. Normal caliber thoracic aorta. Central venous catheter with tip at the cavoatrial junction. Mediastinum/Nodes: Mildly prominent lymph nodes throughout the mediastinum. Largest pretracheal nodes measuring about 15 mm diameter. The etiology is nonspecific but probably reactive. Esophagus is decompressed with an enteric tube in place. Lungs/Pleura: Patchy nodular airspace infiltrates demonstrated throughout both lungs. This could represent multifocal pneumonia, edema, or ARDS. Linear atelectasis in both lung bases. Endotracheal tube is in place. Small amount of secretions in the dependent trachea. Small bilateral pleural effusions slightly greater on the right. Upper Abdomen: Surgical absence of the gallbladder. Calcified granulomas in spleen. Vascular calcifications. Enteric tube tip is in stomach. Musculoskeletal: Mild degenerative changes in the spine. No destructive bone lesions. IMPRESSION: Cardiac enlargement with small pericardial effusion. Small bilateral pleural effusions. Patchy nodular airspace disease throughout both lungs may indicate edema or pneumonia. Findings suggest congestive failure. Electronically Signed   By: Burman Nieves M.D.   On: 02/26/2016 21:41   Dg Chest Port 1 View  02/06/2016  CLINICAL DATA:  40 year old male with pneumonia EXAM: PORTABLE CHEST 1 VIEW COMPARISON:  Chest radiograph dated 02/12/2016 FINDINGS:  Endotracheal tube approximately 4 cm above the carina. Enteric tube courses into the left upper abdomen with tip beyond the image margin. Right IJ central line with tip over the right cardiac border in stable positioning. Stable small bilateral pleural effusions with patchy bibasilar airspace densities compatible with atelectasis/ infiltrate. No pneumothorax. Stable top-normal cardiac size. Lower cervical fusion plate and screw. No acute fracture. IMPRESSION: Small bilateral pleural effusions with bilateral mid to lower lung field airspace densities, no significantly changed since the prior study. Follow-up recommended with Support lines and tubes as described in stable positioning. Electronically Signed   By: Elgie Collard M.D.   On: 02/08/2016 19:19     STUDIES:  ECHO 7/13: Left ventricle: The cavity size was normal. There was moderate concentric hypertrophy. Systolic function was mildly to moderately reduced. The estimated ejection fraction was in the range of 40% to 45%. Diffuse hypokinesis. There is akinesis of the inferoseptal myocardium. - Aortic valve: There was trivial regurgitation.- Mitral valve: Moderately  calcified annulus. - Pericardium, extracardiac: A small pericardial effusion was identified along the left ventricular free wall. There was no evidence of hemodynamic compromise.  CULTURES: BCX2 7/10:  Gm negative coccobacilli>>> resp culture 7/10: Moderate SA and E.coli (both pan sensitive)  ANTIBIOTICS: cipro 7/12>>>7/14 vanc 7/2>>> Cefepime 7/14>>> Fluconazole 7/7>>>7/17  SIGNIFICANT EVENTS: 7/14 Transfer from [facility] to [facility]  LINES/TUBES: OETT 7/10>>> Right IJ PICC (? Insertion date)>>>  DISCUSSION: 40 year old esrd admitted initially to [facility] from [facility] d/t PNA and failure to wean. Self extubated 7/5. Clinically deteriorated over the following days. Required re-intubation 7/10 w/ findings of new HCAP and bacteremia. Now being transferred to [facility] due to multiple complex  on-going issues: sepsis, NSTEMI, recent GIB when placed on LMWH for MI. Also concerned about endocarditis. Will admit him to the ICU, obtain CT chest, administer abx, and eventually will need TEE. Have consulted cards and renal. May need ID assist.    ASSESSMENT / PLAN:  PULMONARY A: Acute hypoxic Respiratory failure  HCAP Bilateral pleural effusions w/ recent parapneumonic sample from right effusion  P:   Pressure support wean as tolerated  CARDIOVASCULAR A:  Sepsis  Recent NSTEMI vs demand ischemia. Acute systolic CHF P:  Will need heart cath when more stable  RENAL A:   ESRD HD dependent  P:   Replace electrolytes as needed Will contact renal   GASTROINTESTINAL A:   Recent GIB on LMWH P:   Trend cbc closely  PPI via NGT  HEMATOLOGIC A:   Anemia of critical illness P:  Trend CBC SCDs  INFECTIOUS A:   Bacteremia HCAP P:   vanc and cefepime   ENDOCRINE A:   DM type II w/ hyperglycemia  Secondary hyperparathyroidism  Steroid dependence  P:   ssi  Stress dose steroids-->continue taper   NEUROLOGIC A:   Acute Encephalopathy in setting of critical illness.  P:   RASS goal: 0 PAD protocol   CC time 31 minutes.  Coralyn Helling, MD [facility] Pulmonary/Critical Care 02/17/2016, 9:44 AM Pager:  [phone] After 3pm call: [phone]

## 2016-02-18 ENCOUNTER — Inpatient Hospital Stay (HOSPITAL_COMMUNITY): Payer: Medicare Other

## 2016-02-18 DIAGNOSIS — J9601 Acute respiratory failure with hypoxia: Secondary | ICD-10-CM

## 2016-02-18 DIAGNOSIS — R6520 Severe sepsis without septic shock: Secondary | ICD-10-CM

## 2016-02-18 DIAGNOSIS — N186 End stage renal disease: Secondary | ICD-10-CM

## 2016-02-18 DIAGNOSIS — A419 Sepsis, unspecified organism: Secondary | ICD-10-CM

## 2016-02-18 DIAGNOSIS — I5023 Acute on chronic systolic (congestive) heart failure: Secondary | ICD-10-CM | POA: Diagnosis present

## 2016-02-18 DIAGNOSIS — I214 Non-ST elevation (NSTEMI) myocardial infarction: Secondary | ICD-10-CM | POA: Diagnosis present

## 2016-02-18 LAB — CBC
HCT: 26.9 % — ABNORMAL LOW (ref 39.0–52.0)
HEMOGLOBIN: 8.2 g/dL — AB (ref 13.0–17.0)
MCH: 27 pg (ref 26.0–34.0)
MCHC: 30.5 g/dL (ref 30.0–36.0)
MCV: 88.5 fL (ref 78.0–100.0)
PLATELETS: 174 10*3/uL (ref 150–400)
RBC: 3.04 MIL/uL — AB (ref 4.22–5.81)
RDW: 17 % — ABNORMAL HIGH (ref 11.5–15.5)
WBC: 17.7 10*3/uL — AB (ref 4.0–10.5)

## 2016-02-18 LAB — RENAL FUNCTION PANEL
ALBUMIN: 1.5 g/dL — AB (ref 3.5–5.0)
ANION GAP: 10 (ref 5–15)
BUN: 78 mg/dL — ABNORMAL HIGH (ref 6–20)
CHLORIDE: 100 mmol/L — AB (ref 101–111)
CO2: 28 mmol/L (ref 22–32)
Calcium: 7.7 mg/dL — ABNORMAL LOW (ref 8.9–10.3)
Creatinine, Ser: 3.65 mg/dL — ABNORMAL HIGH (ref 0.61–1.24)
GFR calc Af Amer: 22 mL/min — ABNORMAL LOW (ref >60)
GFR calc non Af Amer: 19 mL/min — ABNORMAL LOW (ref >60)
GLUCOSE: 128 mg/dL — AB (ref 65–99)
PHOSPHORUS: 4.4 mg/dL (ref 2.5–4.6)
POTASSIUM: 3.8 mmol/L (ref 3.5–5.1)
Sodium: 138 mmol/L (ref 135–145)

## 2016-02-18 LAB — PROCALCITONIN: Procalcitonin: 7.55 ng/mL

## 2016-02-18 LAB — MAGNESIUM
MAGNESIUM: 2.1 mg/dL (ref 1.7–2.4)
MAGNESIUM: 2.1 mg/dL (ref 1.7–2.4)

## 2016-02-18 LAB — PHOSPHORUS: PHOSPHORUS: 5.3 mg/dL — AB (ref 2.5–4.6)

## 2016-02-18 MED ORDER — FENTANYL CITRATE (PF) 100 MCG/2ML IJ SOLN
50.0000 ug | Freq: Once | INTRAMUSCULAR | Status: AC
  Administered 2016-02-18: 50 ug via INTRAVENOUS
  Filled 2016-02-18: qty 2

## 2016-02-18 MED ORDER — DEXTROSE 50 % IV SOLN
INTRAVENOUS | Status: AC
  Administered 2016-02-18: 25 mL
  Filled 2016-02-18: qty 50

## 2016-02-18 MED ORDER — FENTANYL CITRATE (PF) 2500 MCG/50ML IJ SOLN
25.0000 ug/h | INTRAMUSCULAR | Status: DC
  Administered 2016-02-18: 50 ug/h via INTRAVENOUS
  Filled 2016-02-18 (×2): qty 50

## 2016-02-18 MED ORDER — FENTANYL BOLUS VIA INFUSION
50.0000 ug | INTRAVENOUS | Status: DC | PRN
  Administered 2016-02-18: 50 ug via INTRAVENOUS
  Filled 2016-02-18: qty 50

## 2016-02-18 NOTE — Progress Notes (Signed)
Patient Name: Tanner Campbell Date of Encounter: 02/18/2016   Primary Cardiologist: new (Dr. Debara Pickett) Pt. Profile: Tanner Campbell is 40 yo male with PMH of HTN, HLD, DM, Multiple sclerosis, COPD and ESRD on HD since 05/02/2014 presented with acute resp distress, failed multiple extubation attempt. Acute respiratory failure felt to be due to a combination of Haemophilus pneumonia and volume overload. Patient has missed at least 3 episodes of hemodialysis before presentation, nephrology managing hemodialysis.Found to have mildly elevated trop on 7/7-7/8.   Cardiology consulted 7/10 for elevated troponin and EKG changes. An echocardiogram 02/07/16 a mildly reduced LVEF of 45-50% however there is akinesis of the mid inferior and inferoseptal myocardium, mild aortic insufficiency and a small pericardial effusion. A repeat EKG today shows worsening deep T-wave inversions in the antero-lateral leads with high voltage as well as inferior T-wave inversions. Advice to avoid ASA in setting of acute GI bleed.   Echo done 02/15/16 showed slightly worse LV function with focal inferior wall motion abnormality, which could suggest severe ischemia or infarct in this territory. There is a small pericardial effusion which is stable. Resumed ASA. Last seen  By DR. Hilty 03/04/2016 and recommended cath ultimately if he is extubated.   In term, his sputum cultures are growing MSSA and e coli and also has positive blood cultures. The patient has more pronounced murmur and friction rub -->now requesting consideration of TEE to r/o endocarditis.   Peak of troponin was 0.50 02/12/16. Now trending down.   SUBJECTIVE    CURRENT MEDS . albuterol  2.5 mg Nebulization Q4H  . antiseptic oral rinse  7 mL Mouth Rinse QID  . atorvastatin  80 mg Per Tube q1800  . [START ON 02/19/2016] ceFEPime (MAXIPIME) IV  2 g Intravenous Q M,W,F-1800  . chlorhexidine gluconate (SAGE KIT)  15 mL Mouth Rinse BID  . insulin aspart  0-9 Units Subcutaneous  Q4H  . insulin glargine  10 Units Subcutaneous BID  . metoprolol tartrate  50 mg Per Tube BID  . multivitamin  1 tablet Oral QHS  . pantoprazole sodium  40 mg Per Tube BID  . predniSONE  20 mg Per Tube Q breakfast  . [START ON 02/19/2016] vancomycin  750 mg Intravenous Q M,W,F-HD    OBJECTIVE  Filed Vitals:   02/18/16 1133 02/18/16 1139 02/18/16 1200 02/18/16 1218  BP:   141/74   Pulse:   91   Temp:    98.7 F (37.1 C)  TempSrc:    Oral  Resp:      Weight:      SpO2: 100% 100% 99%     Intake/Output Summary (Last 24 hours) at 02/18/16 1322 Last data filed at 02/18/16 1200  Gross per 24 hour  Intake 1446.38 ml  Output      0 ml  Net 1446.38 ml   Filed Weights   02/17/16 0414 02/18/16 0412  Weight: 128 lb 12 oz (58.4 kg) 130 lb 1.1 oz (59 kg)    PHYSICAL EXAM  General: Pleasant, NAD. Neuro: Alert and oriented X 3. Moves all extremities spontaneously. Psych: Normal affect. HEENT:  Normal  Neck: Supple without bruits or JVD. Lungs:  Resp regular and unlabored, CTA. Heart: RRR no s3, s4, or murmurs. Abdomen: Soft, non-tender, non-distended, BS + x 4.  Extremities: No clubbing, cyanosis or edema. DP/PT/Radials 2+ and equal bilaterally.  Accessory Clinical Findings  CBC  Recent Labs  02/17/16 0340 02/18/16 0430  WBC 19.5* 17.7*  HGB 8.6* 8.2*  HCT  28.7* 26.9*  MCV 87.8 88.5  PLT 200 242   Basic Metabolic Panel  Recent Labs  02/17/16 0340  02/17/16 1723 02/18/16 0430  NA 139  --   --  138  K 3.9  --   --  3.8  CL 98*  --   --  100*  CO2 29  --   --  28  GLUCOSE 180*  --   --  128*  BUN 55*  --   --  78*  CREATININE 2.67*  --   --  3.65*  CALCIUM 8.1*  --   --  7.7*  MG 2.1  < > 2.1 2.1  PHOS 3.1  < > 4.2 4.4  < > = values in this interval not displayed. Liver Function Tests  Recent Labs  02/04/2016 0510 02/18/16 0430  ALBUMIN 1.5* 1.5*   No results for input(s): LIPASE, AMYLASE in the last 72 hours. Cardiac Enzymes  Recent Labs   02/05/2016 2000 02/17/16 0005 02/17/16 0340  TROPONINI 0.22* 0.21* 0.22*    Radiology/Studies  Ct Chest Wo Contrast  02/17/2016  CLINICAL DATA:  Pneumonia. History of diabetes and hypertension. Hemodialysis patient. EXAM: CT CHEST WITHOUT CONTRAST TECHNIQUE: Multidetector CT imaging of the chest was performed following the standard protocol without IV contrast. COMPARISON:  None. FINDINGS: Cardiovascular: Cardiac enlargement. Coronary artery calcifications. Small pericardial effusion. Normal caliber thoracic aorta. Central venous catheter with tip at the cavoatrial junction. Mediastinum/Nodes: Mildly prominent lymph nodes throughout the mediastinum. Largest pretracheal nodes measuring about 15 mm diameter. The etiology is nonspecific but probably reactive. Esophagus is decompressed with an enteric tube in place. Lungs/Pleura: Patchy nodular airspace infiltrates demonstrated throughout both lungs. This could represent multifocal pneumonia, edema, or ARDS. Linear atelectasis in both lung bases. Endotracheal tube is in place. Small amount of secretions in the dependent trachea. Small bilateral pleural effusions slightly greater on the right. Upper Abdomen: Surgical absence of the gallbladder. Calcified granulomas in spleen. Vascular calcifications. Enteric tube tip is in stomach. Musculoskeletal: Mild degenerative changes in the spine. No destructive bone lesions. IMPRESSION: Cardiac enlargement with small pericardial effusion. Small bilateral pleural effusions. Patchy nodular airspace disease throughout both lungs may indicate edema or pneumonia. Findings suggest congestive failure. Electronically Signed   By: Lucienne Capers M.D.   On: 02/22/2016 21:41   Dg Chest Port 1 View  02/18/2016  CLINICAL DATA:  Respiratory failure EXAM: PORTABLE CHEST 1 VIEW COMPARISON:  CT chest 02/06/2016.  One-view chest x-ray 02/15/2016. FINDINGS: Endotracheal tube is stable. The NG tube courses off the inferior border the  film. A right IJ line is stable. Bilateral pleural effusions and basilar airspace disease is unchanged. Mild edema is stable. IMPRESSION: 1. Stable bilateral pleural effusions and airspace disease. 2. Support apparatus is stable. Electronically Signed   ByHarrell Gave    TTE: 02/15/2016 - Left ventricle: The cavity size was normal. There was moderate  concentric hypertrophy. Systolic function was mildly to  moderately reduced. The estimated ejection fraction was in the  range of 40% to 45%. Diffuse hypokinesis. There is akinesis of  the inferoseptal myocardium. - Aortic valve: There was trivial regurgitation. - Mitral valve: Moderately calcified annulus. - Pericardium, extracardiac: A small pericardial effusion was  identified along the left ventricular free wall. There was no  evidence of hemodynamic compromise.  Impressions:  - Compared to the prior study, there has been no significant  interval change.   ASSESSMENT AND PLAN Active Problems:   Sepsis ([place])   Severe sepsis (  [place])   Acute respiratory failure with hypoxia (HCC)   ESRD (end stage renal disease) ([place])   Respiratory failure (HCC)   Protein-calorie malnutrition, severe    Plan: Worsening LV function. Plan for cath once stable. New had prominent murmur and friction rub. Will schedule for bedside TEE and reassess WM at that time. Troponin trending down. Had active GI bleed previously. Now on ASA. Hgb of 8.2 today.   Signed, Leanor Kail PA-C Pager [phone]   The patient was seen, examined and discussed with Bhagat,Bhavinkumar PA-C and I agree with the above.   40 year old patient who was admitted with acute respiratory failure with intubation on 01/26/2016 also acute kidney failure requiring hemodialysis and possible GI bleed, we were originally consulted on 02/19/2016 for elevated troponin. His echocardiogram showed EF of 45-5 percent with akinesis of the mid inferior and inferoseptal walls grade 2  diastolic dysfunction mild AR, mild MR, mild TR. It was concluded that the patient most probably has coronary artery disease given decreased LV EF and regional wall motion abnormalities however with GI bleed, acute renal failure in critical condition requiring intubation patient was not a candidate for cardiac catheterization.  Troponin reached its peak on 02/12/2016 at 0.5 and has been down trending. ICU team is concerned that patient might have endocarditis, up on physical examination he has loud murmur of possible mitral or tricuspid regurgitation however his most recent echo just shows mild valvular abnormalities.  We will plan for TEE to evaluate for endocarditis and reevaluate LVEF and wall motion abnormalities.  Ena Dawley 02/18/2016

## 2016-02-18 NOTE — Progress Notes (Signed)
eLink Physician-Brief Progress Note Patient Name: Tanner Campbell DOB: Apr 15, 1976 MRN: [id number]   Date of Service  02/18/2016  HPI/Events of Note  Agitation not controlled with PRN fentanyl. Also pushed out bite block and biting down on block  eICU Interventions  Plan: Cont fentanyl sedation with prn for breakthrough     Intervention Category Major Interventions: Delirium, psychosis, severe agitation - evaluation and management  DETERDING,ELIZABETH 02/18/2016, 3:15 AM

## 2016-02-18 NOTE — Progress Notes (Signed)
PULMONARY / CRITICAL CARE MEDICINE   Name: Tanner Campbell MRN: [id number] DOB: [birth year]    ADMISSION DATE:  02-25-16  REFERRING MD:  Sharyon Medicus   CHIEF COMPLAINT:  Prolonged critical illness, failure to wean, recurrent sepsis   HISTORY OF PRESENT ILLNESS:   40 y/o M with PMH of DM II, HTN, HLD, ESRD on HD (T, Th, S), COPD and prior back surgery who initially was admitted to [facility] from 6/21 - 6/29 for acute respiratory failure in the setting of volume overload. He was admitted by the hospitalist service for further evaluation. He was treated with BiPAP on admit. Unfortunately, he decompensated 6/23 and required intubation. The patient was found to have H. Influenza positive sputum and treated with rocephin. CXR demonstrated a large right pleural effusion and underwent a thoracentesis (1L serous fluid removed, likely exudative by protein). He had difficulty with weaning and was transferred to [facility] on 6/29 orally intubated for further evaluation / ventilator weaning. The patient developed fever and antibiotics were expanded to vancomycin, cefepime and diflucan. He had progressed to weaning on PSV x 4 hours as of 7/3. He self extubated on 7/5. Since that time has required BiPAP off and on. After HD 7/10 his mental status worsened and he became minimally responsive. CXR consistent with RLL opacification and he was re-intubated. He was re-cultured at this point, and Also was found to have increased Troponin for which cardiology was consulted. He was placed on LMWH and apparently had GIB while on this and his hgb dropped as low as 8.4 from 9.3. As of 7/14 he remains ventilator dependent. His sputum cultures are growing MSSA and e coli and also has positive blood cultures. Because of all of his overwhelming issues it was felt best to transfer him to a higher level of care.   SUBJECTIVE:  Sedated.  VITAL SIGNS: BP 122/67 mmHg  Pulse 80  Temp(Src) 98.4 F (36.9 C) (Oral)  Resp 23  Wt 130 lb 1.1 oz  (59 kg)  SpO2 97%  VENTILATOR SETTINGS: Vent Mode:  [-] PRVC FiO2 (%):  [28 %] 28 % Set Rate:  [14 bmp] 14 bmp Vt Set:  [500 mL] 500 mL PEEP:  [5 cmH20] 5 cmH20 Plateau Pressure:  [16 cmH20-22 cmH20] 18 cmH20  INTAKE / OUTPUT: I/O last 3 completed shifts: In: 950 [I.V.:290; NG/GT:610; IV Piggyback:50] Out: -   PHYSICAL EXAMINATION: General: ill appearing Neuro: RASS -1 HEENT: ETT in place Cardiac: regular, 3/6 murmur with rub Chest: no wheeze Abd: soft, non tender Ext: no edema, decreased muscle bulk Skin: multiple areas of healing excoriations  LABS:  BMET  Recent Labs Lab 02-25-16 0510 02/17/16 0340 02/18/16 0430  NA 129* 139 138  K 3.9 3.9 3.8  CL 90* 98* 100*  CO2 BUN 87* 55* 78*  CREATININE 3.57* 2.67* 3.65*  GLUCOSE 274* 180* 128*    Electrolytes  Recent Labs Lab 2016-02-25 0510  02/17/16 0340 02/17/16 1030 02/17/16 1723 02/18/16 0430  CALCIUM 7.9*  --  8.1*  --   --  7.7*  MG  --   < > 2.1 1.9 2.1 2.1  PHOS 3.1  --  3.1 3.6 4.2 4.4  < > = values in this interval not displayed.  CBC  Recent Labs Lab 02-25-2016 0510 02/17/16 0340 02/18/16 0430  WBC 21.8* 19.5* 17.7*  HGB 8.4* 8.6* 8.2*  HCT 26.5* 28.7* 26.9*  PLT 226 200 174    Coag's  Recent Labs Lab 2016/02/25 2000  APTT 33  INR 1.15    Sepsis Markers  Recent Labs Lab 02/11/2016 2000 02/17/16 0340  PROCALCITON 11.92 11.54    ABG  Recent Labs Lab 02/12/16 1145 02/12/16 1359  PHART 7.315* 7.508*  PCO2ART 59.0* 34.0*  PO2ART 76.9* 104*    Liver Enzymes  Recent Labs Lab 02/14/16 0544 02/29/2016 0510 02/18/16 0430  ALBUMIN 1.8* 1.5* 1.5*    Cardiac Enzymes  Recent Labs Lab 02/28/2016 2000 02/17/16 0005 02/17/16 0340  TROPONINI 0.22* 0.21* 0.22*    Glucose  Recent Labs Lab 02/19/2016 1742  GLUCAP 308*    Imaging No results found.   STUDIES:  7/13 Echo >> EF 40 to 45%, mod LVH, small pericardial effusion  CULTURES: BCX2 7/10:  Gm  negative coccobacilli>>> resp culture 7/10: Moderate SA and E.coli (both pan sensitive)  ANTIBIOTICS: cipro 7/12>>>7/14 vanc 7/2>>> Cefepime 7/14>>> Fluconazole 7/7>>>7/17  SIGNIFICANT EVENTS: 7/14 Transfer from [facility] to [facility]  LINES/TUBES: OETT 7/10>>> Right IJ PICC (? Insertion date)>>>  DISCUSSION: 40 yo male was at [facility] with PNA, VDRF and failure to wean.  Transferred to [facility] for rehab and weaning.  Developed bacteremia with HCAP in [facility] and recurrent VDRF.  Developed NSTEMI and new murmur.  Transferred to [facility] for further management.   ASSESSMENT / PLAN:  PULMONARY A: Acute hypoxic Respiratory failure  HCAP Bilateral pleural effusions w/ recent parapneumonic sample from right effusion  P:   Pressure support wean as tolerated  CARDIOVASCULAR A:  Sepsis  Recent NSTEMI vs demand ischemia. Acute systolic CHF Small pericardial effusion with friction rub P:  Will need heart cath when more stable Repeat Echo Prednisone  RENAL A:   ESRD HD dependent  P:   Replace electrolytes as needed Will contact renal   GASTROINTESTINAL A:   Recent GIB on LMWH P:   Trend cbc closely  PPI via NGT  HEMATOLOGIC A:   Anemia of critical illness P:  Trend CBC SCDs  INFECTIOUS A:   Bacteremia HCAP P:   vanc and cefepime   ENDOCRINE A:   DM type II w/ hyperglycemia  Secondary hyperparathyroidism  Steroid dependence  P:   ssi   NEUROLOGIC A:   Acute Encephalopathy in setting of critical illness.  P:   RASS goal: 0 PAD protocol   CC time 31 minutes.  Coralyn Helling, MD [facility] Pulmonary/Critical Care 02/18/2016, 8:37 AM Pager:  [phone] After 3pm call: [phone]

## 2016-02-18 NOTE — Progress Notes (Signed)
Patient ID: Tanner Campbell, male   DOB: 12/18/1975, 40 y.o.   MRN: [id number] S:No changes overnight O:BP 138/75 mmHg  Pulse 76  Temp(Src) 98.4 F (36.9 C) (Oral)  Resp 14  Wt 59 kg (130 lb 1.1 oz)  SpO2 99%  Intake/Output Summary (Last 24 hours) at 02/18/16 0928 Last data filed at 02/18/16 0800  Gross per 24 hour  Intake 817.88 ml  Output      0 ml  Net 817.88 ml   Intake/Output: I/O last 3 completed shifts: In: 1012.9 [I.V.:352.9; NG/GT:610; IV Piggyback:50] Out: -   Intake/Output this shift:  Total I/O In: 25 [I.V.:25] Out: -  Weight change: 0.6 kg (1 lb 5.2 oz) General appearance: intubated and not following commands Resp: occassional rhonchi Cardio: systolic murmur: holosystolic 3/6, blowing throughout the precordium and friction rub heard throughout precordium GI: soft, non-tender; bowel sounds normal; no masses, no organomegaly Extremities: LUE AVF +T/B, no edema Dialysis Access: LUE AVF   Recent Labs Lab 02/12/16 0607 02/13/16 0537 02/14/16 0544 02/15/16 0637 02/15/2016 0510 02/17/16 0340 02/17/16 1030 02/17/16 1723 02/18/16 0430  NA 133* 133* 133* 131* 129* 139  --   --  138  K 5.0 4.9 4.5 3.7 3.9 3.9  --   --  3.8  CL 94* 98* 95* 94* 90* 98*  --   --  100*  CO2 21* _0 --   --  28  GLUCOSE 349* 240* 196* 248* 274* 180*  --   --  128*  BUN 155* 70* 104* 56* 87* 55*  --   --  78*  CREATININE 5.53* 3.53* 4.34* 2.87* 3.57* 2.67*  --   --  3.65*  ALBUMIN 2.1*  --  1.8*  --  1.5*  --   --   --  1.5*  CALCIUM 8.1* 7.7* 8.3* 7.9* 7.9* 8.1*  --   --  7.7*  PHOS 7.5*  --  5.4*  --  3.1 3.1 3.6 4.2 4.4   Liver Function Tests:  Recent Labs Lab 02/14/16 0544 02/11/2016 0510 02/18/16 0430  ALBUMIN 1.8* 1.5* 1.5*   No results for input(s): LIPASE, AMYLASE in the last 168 hours. No results for input(s): AMMONIA in the last 168 hours. CBC:  Recent Labs Lab 02/14/16 0544 02/15/16 0637 02/22/2016 0510 02/17/16 0340 02/18/16 0430  WBC 20.5*  16.7* 21.8* 19.5* 17.7*  HGB 9.2* 8.7* 8.4* 8.6* 8.2*  HCT 29.6* 28.6* 26.5* 28.7* 26.9*  MCV 86.3 87.5 85.5 87.8 88.5  PLT 309 248 226 200 174   Cardiac Enzymes:  Recent Labs Lab 02/12/16 1717 02/13/16 0537 02/17/2016 2000 02/17/16 0005 02/17/16 0340  TROPONINI 0.50* 0.40* 0.22* 0.21* 0.22*   CBG:  Recent Labs Lab 02/10/2016 1742  GLUCAP 308*    Iron Studies: No results for input(s): IRON, TIBC, TRANSFERRIN, FERRITIN in the last 72 hours. Studies/Results: Ct Chest Wo Contrast  03/01/2016  CLINICAL DATA:  Pneumonia. History of diabetes and hypertension. Hemodialysis patient. EXAM: CT CHEST WITHOUT CONTRAST TECHNIQUE: Multidetector CT imaging of the chest was performed following the standard protocol without IV contrast. COMPARISON:  None. FINDINGS: Cardiovascular: Cardiac enlargement. Coronary artery calcifications. Small pericardial effusion. Normal caliber thoracic aorta. Central venous catheter with tip at the cavoatrial junction. Mediastinum/Nodes: Mildly prominent lymph nodes throughout the mediastinum. Largest pretracheal nodes measuring about 15 mm diameter. The etiology is nonspecific but probably reactive. Esophagus is decompressed with an enteric tube in place. Lungs/Pleura: Patchy nodular airspace infiltrates demonstrated throughout both lungs.  This could represent multifocal pneumonia, edema, or ARDS. Linear atelectasis in both lung bases. Endotracheal tube is in place. Small amount of secretions in the dependent trachea. Small bilateral pleural effusions slightly greater on the right. Upper Abdomen: Surgical absence of the gallbladder. Calcified granulomas in spleen. Vascular calcifications. Enteric tube tip is in stomach. Musculoskeletal: Mild degenerative changes in the spine. No destructive bone lesions. IMPRESSION: Cardiac enlargement with small pericardial effusion. Small bilateral pleural effusions. Patchy nodular airspace disease throughout both lungs may indicate edema or  pneumonia. Findings suggest congestive failure. Electronically Signed   By: Lucienne Capers M.D.   On: 03/03/2016 21:41   Dg Chest Port 1 View  02/18/2016  CLINICAL DATA:  Respiratory failure EXAM: PORTABLE CHEST 1 VIEW COMPARISON:  CT chest 02/24/2016.  One-view chest x-ray 02/25/2016. FINDINGS: Endotracheal tube is stable. The NG tube courses off the inferior border the film. A right IJ line is stable. Bilateral pleural effusions and basilar airspace disease is unchanged. Mild edema is stable. IMPRESSION: 1. Stable bilateral pleural effusions and airspace disease. 2. Support apparatus is stable. Electronically Signed   By: San Morelle M.D.   On: 02/18/2016 08:48   Dg Chest Port 1 View  02/17/2016  CLINICAL DATA:  40 year old male with pneumonia EXAM: PORTABLE CHEST 1 VIEW COMPARISON:  Chest radiograph dated 02/12/2016 FINDINGS: Endotracheal tube approximately 4 cm above the carina. Enteric tube courses into the left upper abdomen with tip beyond the image margin. Right IJ central line with tip over the right cardiac border in stable positioning. Stable small bilateral pleural effusions with patchy bibasilar airspace densities compatible with atelectasis/ infiltrate. No pneumothorax. Stable top-normal cardiac size. Lower cervical fusion plate and screw. No acute fracture. IMPRESSION: Small bilateral pleural effusions with bilateral mid to lower lung field airspace densities, no significantly changed since the prior study. Follow-up recommended with Support lines and tubes as described in stable positioning. Electronically Signed   By: Anner Crete M.D.   On: 02/10/2016 19:19   . albuterol  2.5 mg Nebulization Q4H  . antiseptic oral rinse  7 mL Mouth Rinse QID  . atorvastatin  80 mg Per Tube q1800  . [START ON 02/19/2016] ceFEPime (MAXIPIME) IV  2 g Intravenous Q M,W,F-1800  . chlorhexidine gluconate (SAGE KIT)  15 mL Mouth Rinse BID  . insulin aspart  0-9 Units Subcutaneous Q4H  . insulin  glargine  10 Units Subcutaneous BID  . metoprolol tartrate  50 mg Per Tube BID  . multivitamin  1 tablet Oral QHS  . pantoprazole sodium  40 mg Per Tube BID  . predniSONE  20 mg Per Tube Q breakfast  . [START ON 02/19/2016] vancomycin  750 mg Intravenous Q M,W,F-HD    BMET    Component Value Date/Time   NA 138 02/18/2016 0430   K 3.8 02/18/2016 0430   CL 100* 02/18/2016 0430   CO2 28 02/18/2016 0430   GLUCOSE 128* 02/18/2016 0430   BUN 78* 02/18/2016 0430   CREATININE 3.65* 02/18/2016 0430   CALCIUM 7.7* 02/18/2016 0430   CALCIUM 8.5* 02/03/2016 0600   GFRNONAA 19* 02/18/2016 0430   GFRAA 22* 02/18/2016 0430   CBC    Component Value Date/Time   WBC 17.7* 02/18/2016 0430   RBC 3.04* 02/18/2016 0430   HGB 8.2* 02/18/2016 0430   HCT 26.9* 02/18/2016 0430   PLT 174 02/18/2016 0430   MCV 88.5 02/18/2016 0430   MCH 27.0 02/18/2016 0430   MCHC 30.5 02/18/2016 0430  RDW 17.0* 02/18/2016 0430   LYMPHSABS 0.6* 02/11/2016 0600   MONOABS 0.4 02/11/2016 0600   EOSABS 0.0 02/11/2016 0600   BASOSABS 0.0 02/11/2016 0600    Dialysis Orders: Center: [facility] on MWF .   Assessment/Plan: 1. VDRF - transferred from [facility] due to prolonged respiratory failure. PCCM managing 2. Heart murmer and friction rub - ECHO performed 02/18/2016 with worsened LV function and wall motion abnormality suggesting ischemia or infarct. Unclear why murmer more pronounced and with friction rub. Consider TEE to r/o endocarditis.  Cardiology following due to wall motion abnormality and for cardiac cath when stable 3. ESRD - Continue with HD MWF 4. Hypertension/volume - stable 5. Anemia - on Aranesp 73mg IV weekly. Will increase to 646m and follow iron stores 6. Metabolic bone disease - Will follow ca/phos 7. Nutrition - per primary svc  JoDonetta Potts

## 2016-02-18 NOTE — Progress Notes (Signed)
Pt w/ low RR, low vT, low minute volume, pt changed back to full vent support.

## 2016-02-19 ENCOUNTER — Inpatient Hospital Stay (HOSPITAL_COMMUNITY): Payer: Medicare Other

## 2016-02-19 DIAGNOSIS — I248 Other forms of acute ischemic heart disease: Secondary | ICD-10-CM

## 2016-02-19 DIAGNOSIS — E43 Unspecified severe protein-calorie malnutrition: Secondary | ICD-10-CM

## 2016-02-19 DIAGNOSIS — R012 Other cardiac sounds: Secondary | ICD-10-CM

## 2016-02-19 DIAGNOSIS — Z992 Dependence on renal dialysis: Secondary | ICD-10-CM

## 2016-02-19 DIAGNOSIS — Z9911 Dependence on respirator [ventilator] status: Secondary | ICD-10-CM

## 2016-02-19 DIAGNOSIS — R7881 Bacteremia: Secondary | ICD-10-CM

## 2016-02-19 LAB — POCT I-STAT 3, ART BLOOD GAS (G3+)
Acid-Base Excess: 7 mmol/L — ABNORMAL HIGH (ref 0.0–2.0)
Acid-Base Excess: 5 mmol/L — ABNORMAL HIGH (ref 0.0–2.0)
BICARBONATE: 30.8 meq/L — AB (ref 20.0–24.0)
BICARBONATE: 28.9 meq/L — AB (ref 20.0–24.0)
O2 SAT: 98 %
O2 Saturation: 98 %
PCO2 ART: 38.5 mmHg (ref 35.0–45.0)
PCO2 ART: 37.8 mmHg (ref 35.0–45.0)
PO2 ART: 94 mmHg (ref 80.0–100.0)
PO2 ART: 96 mmHg (ref 80.0–100.0)
Patient temperature: 97.6
TCO2: 32 mmol/L (ref 0–100)
TCO2: 30 mmol/L (ref 0–100)
pH, Arterial: 7.511 — ABNORMAL HIGH (ref 7.350–7.450)
pH, Arterial: 7.489 — ABNORMAL HIGH (ref 7.350–7.450)

## 2016-02-19 LAB — GLUCOSE, CAPILLARY
GLUCOSE-CAPILLARY: 137 mg/dL — AB (ref 65–99)
GLUCOSE-CAPILLARY: 154 mg/dL — AB (ref 65–99)
GLUCOSE-CAPILLARY: 190 mg/dL — AB (ref 65–99)
GLUCOSE-CAPILLARY: 191 mg/dL — AB (ref 65–99)
GLUCOSE-CAPILLARY: 219 mg/dL — AB (ref 65–99)
GLUCOSE-CAPILLARY: 246 mg/dL — AB (ref 65–99)
GLUCOSE-CAPILLARY: 257 mg/dL — AB (ref 65–99)
GLUCOSE-CAPILLARY: 259 mg/dL — AB (ref 65–99)
GLUCOSE-CAPILLARY: 296 mg/dL — AB (ref 65–99)
GLUCOSE-CAPILLARY: 64 mg/dL — AB (ref 65–99)
Glucose-Capillary: 123 mg/dL — ABNORMAL HIGH (ref 65–99)
Glucose-Capillary: 124 mg/dL — ABNORMAL HIGH (ref 65–99)
Glucose-Capillary: 124 mg/dL — ABNORMAL HIGH (ref 65–99)
Glucose-Capillary: 153 mg/dL — ABNORMAL HIGH (ref 65–99)
Glucose-Capillary: 165 mg/dL — ABNORMAL HIGH (ref 65–99)
Glucose-Capillary: 74 mg/dL (ref 65–99)
Glucose-Capillary: 82 mg/dL (ref 65–99)
Glucose-Capillary: 98 mg/dL (ref 65–99)

## 2016-02-19 LAB — CBC
HEMATOCRIT: 27.1 % — AB (ref 39.0–52.0)
HEMOGLOBIN: 8.2 g/dL — AB (ref 13.0–17.0)
MCH: 26.8 pg (ref 26.0–34.0)
MCHC: 30.3 g/dL (ref 30.0–36.0)
MCV: 88.6 fL (ref 78.0–100.0)
Platelets: 167 10*3/uL (ref 150–400)
RBC: 3.06 MIL/uL — AB (ref 4.22–5.81)
RDW: 16.9 % — ABNORMAL HIGH (ref 11.5–15.5)
WBC: 18.2 10*3/uL — AB (ref 4.0–10.5)

## 2016-02-19 LAB — RENAL FUNCTION PANEL
Albumin: 1.8 g/dL — ABNORMAL LOW (ref 3.5–5.0)
Anion gap: 12 (ref 5–15)
BUN: 99 mg/dL — AB (ref 6–20)
CHLORIDE: 98 mmol/L — AB (ref 101–111)
CO2: 29 mmol/L (ref 22–32)
Calcium: 7.8 mg/dL — ABNORMAL LOW (ref 8.9–10.3)
Creatinine, Ser: 4.43 mg/dL — ABNORMAL HIGH (ref 0.61–1.24)
GFR calc Af Amer: 18 mL/min — ABNORMAL LOW (ref >60)
GFR, EST NON AFRICAN AMERICAN: 15 mL/min — AB (ref >60)
Glucose, Bld: 98 mg/dL (ref 65–99)
POTASSIUM: 4.9 mmol/L (ref 3.5–5.1)
Phosphorus: 6.2 mg/dL — ABNORMAL HIGH (ref 2.5–4.6)
Sodium: 139 mmol/L (ref 135–145)

## 2016-02-19 MED ORDER — DEXTROSE 5 % IV SOLN
2.0000 g | INTRAVENOUS | Status: AC
  Administered 2016-02-19 – 2016-02-23 (×3): 2 g via INTRAVENOUS
  Filled 2016-02-19 (×4): qty 2

## 2016-02-19 MED ORDER — FENTANYL CITRATE (PF) 100 MCG/2ML IJ SOLN
100.0000 ug | INTRAMUSCULAR | Status: DC | PRN
  Administered 2016-02-19: 100 ug via INTRAVENOUS
  Filled 2016-02-19 (×2): qty 2

## 2016-02-19 MED ORDER — VITAL AF 1.2 CAL PO LIQD
1000.0000 mL | ORAL | Status: DC
  Administered 2016-02-19 – 2016-02-21 (×3): 1000 mL

## 2016-02-19 MED ORDER — ETOMIDATE 2 MG/ML IV SOLN
20.0000 mg | Freq: Once | INTRAVENOUS | Status: AC
  Administered 2016-02-19: 20 mg via INTRAVENOUS

## 2016-02-19 MED ORDER — MIDAZOLAM HCL 2 MG/2ML IJ SOLN
1.0000 mg | Freq: Once | INTRAMUSCULAR | Status: AC
  Administered 2016-02-19: 1 mg via INTRAVENOUS

## 2016-02-19 MED ORDER — LIDOCAINE HCL (PF) 1 % IJ SOLN: 5.0000 mL | INTRAMUSCULAR | Status: DC | PRN

## 2016-02-19 MED ORDER — METOPROLOL TARTRATE 5 MG/5ML IV SOLN
5.0000 mg | Freq: Four times a day (QID) | INTRAVENOUS | Status: DC | PRN
  Filled 2016-02-19: qty 5

## 2016-02-19 MED ORDER — ALTEPLASE 2 MG IJ SOLR
2.0000 mg | Freq: Once | INTRAMUSCULAR | Status: DC | PRN
  Filled 2016-02-19: qty 2

## 2016-02-19 MED ORDER — FENTANYL CITRATE (PF) 100 MCG/2ML IJ SOLN: 25.0000 ug | INTRAMUSCULAR | Status: DC | PRN

## 2016-02-19 MED ORDER — PENTAFLUOROPROP-TETRAFLUOROETH EX AERO: 1.0000 "application " | INHALATION_SPRAY | CUTANEOUS | Status: DC | PRN

## 2016-02-19 MED ORDER — DARBEPOETIN ALFA 60 MCG/0.3ML IJ SOSY: 60.0000 ug | PREFILLED_SYRINGE | INTRAMUSCULAR | Status: DC

## 2016-02-19 MED ORDER — FENTANYL CITRATE (PF) 100 MCG/2ML IJ SOLN
100.0000 ug | Freq: Once | INTRAMUSCULAR | Status: AC
  Administered 2016-02-19: 100 ug via INTRAVENOUS

## 2016-02-19 MED ORDER — SODIUM CHLORIDE 0.9 % IV BOLUS (SEPSIS)
500.0000 mL | Freq: Once | INTRAVENOUS | Status: AC
  Administered 2016-02-19: 500 mL via INTRAVENOUS

## 2016-02-19 MED ORDER — MIDAZOLAM HCL 2 MG/2ML IJ SOLN
2.0000 mg | INTRAMUSCULAR | Status: DC | PRN
  Administered 2016-02-19 – 2016-02-23 (×6): 2 mg via INTRAVENOUS
  Filled 2016-02-19 (×5): qty 2

## 2016-02-19 MED ORDER — FENTANYL CITRATE (PF) 100 MCG/2ML IJ SOLN
INTRAMUSCULAR | Status: AC
  Administered 2016-02-19: 100 ug via INTRAVENOUS
  Filled 2016-02-19: qty 2

## 2016-02-19 MED ORDER — METHYLPREDNISOLONE SODIUM SUCC 40 MG IJ SOLR
40.0000 mg | Freq: Three times a day (TID) | INTRAMUSCULAR | Status: DC
  Administered 2016-02-19 – 2016-02-20 (×4): 40 mg via INTRAVENOUS
  Filled 2016-02-19 (×6): qty 1

## 2016-02-19 MED ORDER — METOPROLOL TARTRATE 5 MG/5ML IV SOLN
5.0000 mg | Freq: Four times a day (QID) | INTRAVENOUS | Status: DC
  Filled 2016-02-19: qty 5

## 2016-02-19 MED ORDER — FAMOTIDINE IN NACL 20-0.9 MG/50ML-% IV SOLN
20.0000 mg | Freq: Two times a day (BID) | INTRAVENOUS | Status: DC
  Administered 2016-02-19 – 2016-02-20 (×2): 20 mg via INTRAVENOUS
  Filled 2016-02-19 (×4): qty 50

## 2016-02-19 MED ORDER — LIDOCAINE-PRILOCAINE 2.5-2.5 % EX CREA
1.0000 "application " | TOPICAL_CREAM | CUTANEOUS | Status: DC | PRN
  Filled 2016-02-19: qty 5

## 2016-02-19 MED ORDER — SODIUM CHLORIDE 0.9 % IV SOLN
100.0000 mL | INTRAVENOUS | Status: DC | PRN
Start: 2016-02-19 — End: 2016-02-24

## 2016-02-19 MED ORDER — MIDAZOLAM HCL 2 MG/2ML IJ SOLN
INTRAMUSCULAR | Status: AC
  Filled 2016-02-19: qty 2

## 2016-02-19 MED ORDER — HEPARIN SODIUM (PORCINE) 1000 UNIT/ML DIALYSIS
1000.0000 [IU] | INTRAMUSCULAR | Status: DC | PRN
  Filled 2016-02-19: qty 1

## 2016-02-19 MED ORDER — DEXTROSE 50 % IV SOLN
INTRAVENOUS | Status: AC
  Administered 2016-02-19: 25 mL
  Filled 2016-02-19: qty 50

## 2016-02-19 MED ORDER — FENTANYL CITRATE (PF) 100 MCG/2ML IJ SOLN
100.0000 ug | INTRAMUSCULAR | Status: DC | PRN
  Administered 2016-02-19 – 2016-02-21 (×7): 100 ug via INTRAVENOUS
  Filled 2016-02-19 (×6): qty 2

## 2016-02-19 MED ORDER — MIDAZOLAM HCL 2 MG/2ML IJ SOLN
2.0000 mg | INTRAMUSCULAR | Status: DC | PRN
  Filled 2016-02-19 (×2): qty 2

## 2016-02-19 MED ORDER — VANCOMYCIN HCL IN DEXTROSE 750-5 MG/150ML-% IV SOLN
750.0000 mg | INTRAVENOUS | Status: DC
  Administered 2016-02-19: 750 mg via INTRAVENOUS
  Filled 2016-02-19: qty 150

## 2016-02-19 MED ORDER — METRONIDAZOLE IN NACL 5-0.79 MG/ML-% IV SOLN
500.0000 mg | Freq: Three times a day (TID) | INTRAVENOUS | Status: DC
  Administered 2016-02-19 – 2016-02-22 (×9): 500 mg via INTRAVENOUS
  Filled 2016-02-19 (×12): qty 100

## 2016-02-19 MED ORDER — SODIUM CHLORIDE 0.9 % IV SOLN: 100.0000 mL | INTRAVENOUS | Status: DC | PRN

## 2016-02-19 NOTE — Progress Notes (Signed)
Per d/w Dr. [place], a) hold off on scheduling TEE for now pending improvement in resp status and b) will defer resumption of tube feeds to primary team given resp issues. Dayna Dunn PA-C

## 2016-02-19 NOTE — Consult Note (Signed)
[facility] for Infectious Disease    Date of Admission:  02/04/2016   Total days of antibiotics 4        Day 4 Cefepime        Day 1 metronidazole       Reason for Consult: Veillonella bacteremia    Referring Physician: Kara Mead Primary Care Physician: Not recorded  Principal Problem:   Acute respiratory failure with hypoxia [facility]) Active Problems:   Sepsis ([place])   NSTEMI (non-ST elevated myocardial infarction) ([place])   Anaerobic bacteremia   ESRD (end stage renal disease) ([place])   Protein-calorie malnutrition, severe   Acute on chronic systolic CHF (congestive heart failure), NYHA class 3 ([place])   . antiseptic oral rinse  7 mL Mouth Rinse QID  . atorvastatin  80 mg Per Tube q1800  . ceFEPime (MAXIPIME) IV  2 g Intravenous Q M,W,F-1800  . chlorhexidine gluconate (SAGE KIT)  15 mL Mouth Rinse BID  . [START ON 02/21/2016] darbepoetin (ARANESP) injection - DIALYSIS  60 mcg Intravenous Q Wed-HD  . famotidine (PEPCID) IV  20 mg Intravenous Q12H  . feeding supplement (VITAL AF 1.2 CAL)  1,000 mL Per Tube Q24H  . insulin aspart  0-9 Units Subcutaneous Q4H  . methylPREDNISolone (SOLU-MEDROL) injection  40 mg Intravenous Q8H  . metronidazole  500 mg Intravenous Q8H  . multivitamin  1 tablet Oral QHS    Recommendations: 1. Continue treatment with cefepime and metronidazole 2. Agree with echocardiography to exclude endocarditis  Assessment: Veillonella bacteremia: Tanner Campbell has 2/2 blood culture positive of this rarely virulent organism. He may be predisposed to an unusual infection due to malnutrition, glucocorticoids, and critical illness. His source of infection is not certain, although his recent hospital course includes GI bleeding, aspiration events, and repeated intubation that could have caused some mild injury. These are possible opportunities for transposition of a normal flora to his blood stream. He has a documented penicillin allergy so metronidazole is  definitely a good choice of antibiotic for this. Bacteremia from veillonella has been associated with several localized complications including osteomyelitis or endocarditis, and we should be very suspicious if repeated blood cultures are not cleared.   Respiratory failure: Possible hospital acquired pneumonia at this time with sputum growing MSSA and E. Coli. His original respiratory failure involved neither of these organisms. His chest imaging has bilateral infiltrates and effusions that may be more from edema or underlying pulmonary disease. Due to his critical illness it is certainly reasonable to cover these organisms with cefepime.   HPI: Tanner Campbell is a 40 y.o. male with a history of COPD and ESRD who originally presented to [facility] on 6/21 with acute hypoxic respiratory failure after missing hemodialysis. He was noted to be volume overloaded with pulmonary edema and hyperkalemic. A large right pleural effusion was drained and appeared borderline exudative. Despite volume removal he required intubation for continued respiratory failure. Sputum cultures from 6/24 resulted positive for hemophilus influenza and he was treated with ceftriaxone. Due to slow progress weaning he was transferred to [facility] for continued care on ventilator support. While at [facility] he self extubated on 7/5 and subsequently deteriorated in mental and respiratory status until requiring reintubation on 7/10. His course was then complicated by new NSTEMI and GI bleeding on heparin for this and he was transferred to [facility] on 7/14. He remains ventilator dependent with bilateral pleural effusions and questionable pulmonary infiltrates versus edema. Now cultures have returned positive for  E. Coli and MSSA in sputum and veillonella parvula in 2/2 blood cultures.   Review of Systems: Review of Systems  Unable to perform ROS: intubated    Past Medical History  Diagnosis Date  . Diabetes mellitus without complication ([place])   .  Hypertension   . COPD (chronic obstructive pulmonary disease) ([place])   . Renal disorder   . Neuropathy ([place])   . Hemodialysis patient ([place])   . High cholesterol     Social History  Substance Use Topics  . Smoking status: Current Every Day Smoker  . Smokeless tobacco: Not on file  . Alcohol Use: No    Family History  Problem Relation Age of Onset  . Rheum arthritis Neg Hx   . Osteoarthritis Neg Hx   . Asthma Neg Hx   . Cancer Neg Hx   . Diabetes Neg Hx    Allergies  Allergen Reactions  . Penicillins Other (See Comments)    Reaction: Unknown    OBJECTIVE: Blood pressure 138/71, pulse 88, temperature 97.4 F (36.3 C), temperature source Oral, resp. rate 14, weight 57.7 kg (127 lb 3.3 oz), SpO2 100 %.  Physical Exam GENERAL- Intubated, arousable to voice, seen on HD HEENT- ETT in place, no lymphadenopathy CARDIAC- RRR, rub present RESP- Ventilator assisted breaths, coarse upper airway sounds ABDOMEN- Soft, nontender, no guarding or rebound EXTREMITIES- no pedal edema, scattered <1cm diameter hyperpigmented rashes on his legs bilaterally NEURO- Moving extremities to command,   Lab Results Lab Results  Component Value Date   WBC 18.2* 02/19/2016   HGB 8.2* 02/19/2016   HCT 27.1* 02/19/2016   MCV 88.6 02/19/2016   PLT 167 02/19/2016    Lab Results  Component Value Date   CREATININE 4.43* 02/19/2016   BUN 99* 02/19/2016   NA 139 02/19/2016   K 4.9 02/19/2016   CL 98* 02/19/2016   CO2 29 02/19/2016    Lab Results  Component Value Date   ALT 50 02/11/2016   AST 42* 02/11/2016   ALKPHOS 281* 02/11/2016   BILITOT 1.4* 02/11/2016     Microbiology: Recent Results (from the past 240 hour(s))  Culture, respiratory (NON-Expectorated)     Status: None   Collection Time: 02/12/16  1:59 PM  Result Value Ref Range Status   Specimen Description TRACHEAL ASPIRATE  Final   Special Requests NONE  Final   Gram Stain   Final    ABUNDANT WBC PRESENT,BOTH PMN AND  MONONUCLEAR ABUNDANT GRAM NEGATIVE RODS FEW GRAM POSITIVE COCCI IN PAIRS    Culture   Final    MODERATE STAPHYLOCOCCUS AUREUS MODERATE ESCHERICHIA COLI    Report Status 02/15/2016 FINAL  Final   Organism ID, Bacteria ESCHERICHIA COLI  Final   Organism ID, Bacteria STAPHYLOCOCCUS AUREUS  Final      Susceptibility   Staphylococcus aureus - MIC*    CIPROFLOXACIN <=0.5 SENSITIVE Sensitive     ERYTHROMYCIN 0.5 SENSITIVE Sensitive     GENTAMICIN <=0.5 SENSITIVE Sensitive     OXACILLIN <=0.25 SENSITIVE Sensitive     TETRACYCLINE <=1 SENSITIVE Sensitive     VANCOMYCIN 1 SENSITIVE Sensitive     TRIMETH/SULFA <=10 SENSITIVE Sensitive     CLINDAMYCIN <=0.25 SENSITIVE Sensitive     RIFAMPIN <=0.5 SENSITIVE Sensitive     Inducible Clindamycin NEGATIVE Sensitive     * MODERATE STAPHYLOCOCCUS AUREUS   Escherichia coli - MIC*    AMPICILLIN >=32 RESISTANT Resistant     CEFAZOLIN <=4 SENSITIVE Sensitive     CEFEPIME <=  1 SENSITIVE Sensitive     CEFTAZIDIME <=1 SENSITIVE Sensitive     CEFTRIAXONE <=1 SENSITIVE Sensitive     CIPROFLOXACIN <=0.25 SENSITIVE Sensitive     GENTAMICIN <=1 SENSITIVE Sensitive     IMIPENEM <=0.25 SENSITIVE Sensitive     TRIMETH/SULFA >=320 RESISTANT Resistant     AMPICILLIN/SULBACTAM >=32 RESISTANT Resistant     PIP/TAZO <=4 SENSITIVE Sensitive     * MODERATE ESCHERICHIA COLI  Culture, blood (routine x 2)     Status: Abnormal (Preliminary result)   Collection Time: 02/12/16  4:00 PM  Result Value Ref Range Status   Specimen Description BLOOD RIGHT HAND  Final   Special Requests BOTTLES DRAWN AEROBIC AND ANAEROBIC 5CC  Final   Culture  Setup Time   Final    GRAM NEGATIVE COCCOBACILLI ANAEROBIC BOTTLE ONLY CRITICAL RESULT CALLED TO, READ BACK BY AND VERIFIED WITH: J. FURIGAY, RN AT [place] ON  02/14/16 BY Heath Lark, MT.    Culture (A)  Final    VEILLONELLA SPECIES ANAEROBIC GRAM NEGATIVE COCCOBACILLI CULTURE REINCUBATED FOR BETTER GROWTH    Report Status PENDING   Incomplete  Blood Culture ID Panel (Reflexed)     Status: None   Collection Time: 02/12/16  4:00 PM  Result Value Ref Range Status   Enterococcus species NOT DETECTED NOT DETECTED Final   Vancomycin resistance NOT DETECTED NOT DETECTED Final   Listeria monocytogenes NOT DETECTED NOT DETECTED Final   Staphylococcus species NOT DETECTED NOT DETECTED Final   Staphylococcus aureus NOT DETECTED NOT DETECTED Final   Methicillin resistance NOT DETECTED NOT DETECTED Final   Streptococcus species NOT DETECTED NOT DETECTED Final   Streptococcus agalactiae NOT DETECTED NOT DETECTED Final   Streptococcus pneumoniae NOT DETECTED NOT DETECTED Final   Streptococcus pyogenes NOT DETECTED NOT DETECTED Final   Acinetobacter baumannii NOT DETECTED NOT DETECTED Final   Enterobacteriaceae species NOT DETECTED NOT DETECTED Final   Enterobacter cloacae complex NOT DETECTED NOT DETECTED Final   Escherichia coli NOT DETECTED NOT DETECTED Final   Klebsiella oxytoca NOT DETECTED NOT DETECTED Final   Klebsiella pneumoniae NOT DETECTED NOT DETECTED Final   Proteus species NOT DETECTED NOT DETECTED Final   Serratia marcescens NOT DETECTED NOT DETECTED Final   Carbapenem resistance NOT DETECTED NOT DETECTED Final   Haemophilus influenzae NOT DETECTED NOT DETECTED Final   Neisseria meningitidis NOT DETECTED NOT DETECTED Final   Pseudomonas aeruginosa NOT DETECTED NOT DETECTED Final   Candida albicans NOT DETECTED NOT DETECTED Final   Candida glabrata NOT DETECTED NOT DETECTED Final   Candida krusei NOT DETECTED NOT DETECTED Final   Candida parapsilosis NOT DETECTED NOT DETECTED Final   Candida tropicalis NOT DETECTED NOT DETECTED Final  Culture, blood (routine x 2)     Status: Abnormal (Preliminary result)   Collection Time: 02/12/16  4:07 PM  Result Value Ref Range Status   Specimen Description BLOOD RIGHT HAND  Final   Special Requests BOTTLES DRAWN AEROBIC AND ANAEROBIC 5CC  Final   Culture  Setup Time    Final    GRAM NEGATIVE COCCOBACILLI ANAEROBIC BOTTLE ONLY CRITICAL RESULT CALLED TO, READ BACK BY AND VERIFIED WITH: J. Fara Olden, RN AT 1415 ON  02/14/16 BY Heath Lark, MT.    Culture (A)  Final    VEILLONELLA SPECIES ANAEROBIC GRAM NEGATIVE COCCOBACILLI CULTURE REINCUBATED FOR BETTER GROWTH    Report Status PENDING  Incomplete  MRSA PCR Screening     Status: None   Collection Time: 02/21/2016  6:48  PM  Result Value Ref Range Status   MRSA by PCR NEGATIVE NEGATIVE Final    Comment:        The GeneXpert MRSA Assay (FDA approved for NASAL specimens only), is one component of a comprehensive MRSA colonization surveillance program. It is not intended to diagnose MRSA infection nor to guide or monitor treatment for MRSA infections.     Collier Salina, MD PGY-II Internal Medicine Resident Pager# [phone] 02/19/2016, 7:45 PM

## 2016-02-19 NOTE — Progress Notes (Signed)
Walked in to patient's room at 0010 and found ET tube out and lying on patients chest. Patient in bilateral wrist restraints, safety mitt removed from right hand. Patient did not show any signs of respiratory distress on assessment, able to weakly talk and cough. Placed on 3L O2 nasal cannula. Pola Corn MD notified. Patient currently resting calmly, will continue to monitor closely.

## 2016-02-19 NOTE — Procedures (Signed)
Patient was seen on dialysis and the procedure was supervised.  BFR 400  Via AVF BP is  104/29.   Patient appears to be tolerating treatment well  Tanner Campbell A 02/19/2016

## 2016-02-19 NOTE — Progress Notes (Signed)
Patient had vomit on gown, suspect possible aspiration. Periodically desating into the 80s. Notified ELINK RN of episodes of vomiting and O2 issues. No further orders at this time. Will continue to monitor.

## 2016-02-19 NOTE — Progress Notes (Signed)
Patient Name: Mung Rinker Date of Encounter: 02/19/2016   Primary Cardiologist: new (Dr. Debara Pickett) Pt. Profile: Mr. Matuszak is 40 yo male with PMH of HTN, HLD, DM, Multiple sclerosis, COPD and ESRD on HD since 05/02/2014 presented with acute resp distress, failed multiple extubation attempt. Acute respiratory failure felt to be due to a combination of Haemophilus pneumonia and volume overload. Patient has missed at least 3 episodes of hemodialysis before presentation, nephrology managing hemodialysis.Found to have mildly elevated trop on 7/7-7/8.   Cardiology consulted 7/10 for elevated troponin and EKG changes. An echocardiogram 02/07/16 a mildly reduced LVEF of 45-50% however there is akinesis of the mid inferior and inferoseptal myocardium, mild aortic insufficiency and a small pericardial effusion. A repeat EKG 7/13 showed worsening deep T-wave inversions in the antero-lateral leads with high voltage as well as inferior T-wave inversions. Advice to avoid ASA in setting of acute GI bleed.   Echo done 02/15/16 showed slightly worse LV function with focal inferior wall motion abnormality, which could suggest severe ischemia or infarct in this territory. There is a small pericardial effusion which is stable. Resumed ASA.   In term, his sputum cultures are growing MSSA and e coli and also has positive blood cultures with gram negative coccobacilli. The patient was noted to have a new friction rub and ? Murmur this weekend.   Peak of troponin was 0.50 02/12/16. Now trending down.   Patient self extubated himself this am.  SUBJECTIVE Patient is awake but not conversant. Nods to questions but no clear understanding.   CURRENT MEDS . antiseptic oral rinse  7 mL Mouth Rinse QID  . atorvastatin  80 mg Per Tube q1800  . ceFEPime (MAXIPIME) IV  2 g Intravenous Q M,W,F-1800  . chlorhexidine gluconate (SAGE KIT)  15 mL Mouth Rinse BID  . [START ON 02/21/2016] darbepoetin (ARANESP) injection - DIALYSIS   60 mcg Intravenous Q Wed-HD  . insulin aspart  0-9 Units Subcutaneous Q4H  . insulin glargine  10 Units Subcutaneous BID  . metoprolol tartrate  50 mg Per Tube BID  . multivitamin  1 tablet Oral QHS  . pantoprazole sodium  40 mg Per Tube BID  . predniSONE  20 mg Per Tube Q breakfast  . vancomycin  750 mg Intravenous Q M,W,F-HD    OBJECTIVE  Filed Vitals:   02/19/16 0300 02/19/16 0349 02/19/16 0400 02/19/16 0744  BP: 152/89  128/37   Pulse: 89  83   Temp:  98.4 F (36.9 C)  98.2 F (36.8 C)  TempSrc:  Oral  Oral  Resp:      Weight:  132 lb 0.9 oz (59.9 kg)    SpO2: 100%  100%     Intake/Output Summary (Last 24 hours) at 02/19/16 0815 Last data filed at 02/18/16 2100  Gross per 24 hour  Intake 1048.17 ml  Output      0 ml  Net 1048.17 ml   Filed Weights   02/17/16 0414 02/18/16 0412 02/19/16 0349  Weight: 128 lb 12 oz (58.4 kg) 130 lb 1.1 oz (59 kg) 132 lb 0.9 oz (59.9 kg)    PHYSICAL EXAM  General: Pleasant, thin WM in NAD. Neuro:  Moves all extremities spontaneously. HEENT:  Normal  Neck: Supple without bruits or JVD. Lungs:  Resp regular with some increased work of breathing. Diffuse coarse rhonchi and upper airway sounds. Heart: RRR loud pericardial friction rub. I cannot hear a definite murmur. Abdomen: Soft, non-tender, non-distended, BS + x 4.  Extremities: No clubbing, cyanosis or edema. DP/PT/Radials 2+ and equal bilaterally.  Accessory Clinical Findings  CBC  Recent Labs  02/18/16 0430 02/19/16 0405  WBC 17.7* 18.2*  HGB 8.2* 8.2*  HCT 26.9* 27.1*  MCV 88.5 88.6  PLT 174 263   Basic Metabolic Panel  Recent Labs  02/18/16 0430 02/18/16 1700 02/19/16 0405  NA 138  --  139  K 3.8  --  4.9  CL 100*  --  98*  CO2 28  --  29  GLUCOSE 128*  --  98  BUN 78*  --  99*  CREATININE 3.65*  --  4.43*  CALCIUM 7.7*  --  7.8*  MG 2.1 2.1  --   PHOS 4.4 5.3* 6.2*   Liver Function Tests  Recent Labs  02/18/16 0430 02/19/16 0405  ALBUMIN 1.5*  1.8*   No results for input(s): LIPASE, AMYLASE in the last 72 hours. Cardiac Enzymes  Recent Labs  02/21/2016 2000 02/17/16 0005 02/17/16 0340  TROPONINI 0.22* 0.21* 0.22*    Radiology/Studies  Ct Chest Wo Contrast  02/18/2016  CLINICAL DATA:  Pneumonia. History of diabetes and hypertension. Hemodialysis patient. EXAM: CT CHEST WITHOUT CONTRAST TECHNIQUE: Multidetector CT imaging of the chest was performed following the standard protocol without IV contrast. COMPARISON:  None. FINDINGS: Cardiovascular: Cardiac enlargement. Coronary artery calcifications. Small pericardial effusion. Normal caliber thoracic aorta. Central venous catheter with tip at the cavoatrial junction. Mediastinum/Nodes: Mildly prominent lymph nodes throughout the mediastinum. Largest pretracheal nodes measuring about 15 mm diameter. The etiology is nonspecific but probably reactive. Esophagus is decompressed with an enteric tube in place. Lungs/Pleura: Patchy nodular airspace infiltrates demonstrated throughout both lungs. This could represent multifocal pneumonia, edema, or ARDS. Linear atelectasis in both lung bases. Endotracheal tube is in place. Small amount of secretions in the dependent trachea. Small bilateral pleural effusions slightly greater on the right. Upper Abdomen: Surgical absence of the gallbladder. Calcified granulomas in spleen. Vascular calcifications. Enteric tube tip is in stomach. Musculoskeletal: Mild degenerative changes in the spine. No destructive bone lesions. IMPRESSION: Cardiac enlargement with small pericardial effusion. Small bilateral pleural effusions. Patchy nodular airspace disease throughout both lungs may indicate edema or pneumonia. Findings suggest congestive failure. Electronically Signed   By: Lucienne Capers M.D.   On: 02/28/2016 21:41   Dg Chest Port 1 View  02/18/2016  CLINICAL DATA:  Respiratory failure EXAM: PORTABLE CHEST 1 VIEW COMPARISON:  CT chest 02/26/2016.  One-view chest  x-ray 02/15/2016. FINDINGS: Endotracheal tube is stable. The NG tube courses off the inferior border the film. A right IJ line is stable. Bilateral pleural effusions and basilar airspace disease is unchanged. Mild edema is stable. IMPRESSION: 1. Stable bilateral pleural effusions and airspace disease. 2. Support apparatus is stable. Electronically Signed   ByHarrell Gave    TTE: 02/15/2016 - Left ventricle: The cavity size was normal. There was moderate  concentric hypertrophy. Systolic function was mildly to  moderately reduced. The estimated ejection fraction was in the  range of 40% to 45%. Diffuse hypokinesis. There is akinesis of  the inferoseptal myocardium. - Aortic valve: There was trivial regurgitation. - Mitral valve: Moderately calcified annulus. - Pericardium, extracardiac: A small pericardial effusion was  identified along the left ventricular free wall. There was no  evidence of hemodynamic compromise.  Impressions:  - Compared to the prior study on 02/07/16, there has been no significant  interval change.   ASSESSMENT AND PLAN Active Problems:   Sepsis ([place])   Severe sepsis (  [place])   Acute respiratory failure with hypoxia ([facility])   ESRD (end stage renal disease) ([place])   Respiratory failure ([facility])   Protein-calorie malnutrition, severe   Acute on chronic systolic CHF (congestive heart failure), NYHA class 3 ([facility])   NSTEMI (non-ST elevated myocardial infarction) ([place])    1. Gram negative sepsis/bacteremia. On antibiotics per CCM. Afebrile.   2. Acute respiratory failure. Self extubated today. Oxygenating well. Combined volume overload and PNA.   3. Elevated troponin. Peak 0.5. Probably related to demand ischemia. 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 May need to consider ischemic evaluation with cardiac cath once concomitant medical issues improved.   4. Pericardial friction rub. ? Pleural rub as well.  Both Echo on 7/13 and CT on 7/14 show a small pericardial effusion. No evidence of tamponade. No clear symptoms. Will monitor for now. I am not convinced there is a new murmur. Will hold off on repeating an Echo. Given bacteremia may want to consider TEE at some point but respiratory status is too tenuous to consider today. Will follow.  5. ESRD on dialysis. Management of volume status per Renal.   6. Recent GI bleed.   7. Acute encephalitis in setting of critical illness.   8. Anemia  Arie Sabina [facility] 02/19/2016

## 2016-02-19 NOTE — Progress Notes (Signed)
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 He was admitted by the hospitalist service for further evaluation. He was treated with BiPAP on admit. Unfortunately, he decompensated 6/23 and required intubation. The patient was found to have H. Influenza positive sputum and treated with rocephin. CXR demonstrated a large right pleural effusion and underwent a thoracentesis (1L serous fluid removed, likely exudative by protein). He had difficulty with weaning and was transferred to [facility] on 6/29 orally intubated for further evaluation / ventilator weaning. The patient developed fever and antibiotics were expanded to vancomycin, cefepime and diflucan. He had progressed to weaning on PSV x 4 hours as of 7/3. He self extubated on 7/5. Since that time has required BiPAP off and on. After HD 7/10 his mental status worsened and he became minimally responsive. CXR consistent with RLL opacification and he was re-intubated. He was re-cultured at this point, and Also was found to have increased Troponin for which cardiology was consulted. He was placed on LMWH and apparently had GIB while on this and his hgb dropped as low as 8.4 from 9.3. As of 7/14 he remains ventilator dependent. His sputum cultures are growing MSSA and e coli and also has positive blood cultures. Because of all of his overwhelming issues it was felt best to transfer him to a higher level of care.   SUBJECTIVE:  Pt self-extubated this morning. He was unable to answer questions secondary to dyspnea. During rounds, he developed  agonal breathing. He was subsequently intubated.  VITAL SIGNS: BP 119/45 mmHg  Pulse 82  Temp(Src) 96.5 F (35.8 C) (Axillary)  Resp 23  Wt 59.9 kg (132 lb 0.9 oz)  SpO2 98%  VENTILATOR SETTINGS: Vent Mode:  [-] PRVC FiO2 (%):  [28 %-40 %] 40 % Set Rate:  [14 bmp] 14 bmp Vt Set:  [500 mL] 500 mL PEEP:  [5 cmH20] 5 cmH20 Pressure Support:  [5 cmH20] 5 cmH20 Plateau Pressure:  [21 cmH20-36 cmH20] 36 cmH20  INTAKE / OUTPUT: I/O last 3 completed shifts: In: 1976.1 [I.V.:491.1; NG/GT:1485] Out: -   PHYSICAL EXAMINATION: General: ill appearing, cachetic  Neuro: Sedated HEENT: ETT in place Cardiac: regular, friction rub auscultated Chest: no wheeze Abd: soft, non tender Ext: no edema, decreased muscle bulk Skin: multiple areas of healing excoriations on the lower extremities  LABS:  BMET  Recent Labs Lab 02/17/16 0340 02/18/16 0430 02/19/16 0405  NA 139 138 139  K 3.9 3.8 4.9  CL 98* 100* 98*  CO2 29 28 29   BUN 55* 78* 99*  CREATININE 2.67* 3.65* 4.43*  GLUCOSE 180* 128* 98    Electrolytes  Recent Labs Lab 02/17/16 0340  02/17/16 1723 02/18/16 0430 02/18/16 1700 02/19/16 0405  CALCIUM 8.1*  --   --  7.7*  --  7.8*  MG 2.1  < > 2.1 2.1 2.1  --   PHOS 3.1  < > 4.2 4.4 5.3* 6.2*  < > = values in this interval not displayed.  CBC  Recent Labs Lab 02/17/16 0340 02/18/16 0430  02/19/16 0405  WBC 19.5* 17.7* 18.2*  HGB 8.6* 8.2* 8.2*  HCT 28.7* 26.9* 27.1*  PLT 200 174 167    Coag's  Recent Labs Lab 02/09/2016 2000  APTT 33  INR 1.15    Sepsis Markers  Recent Labs Lab 02/08/2016 2000 02/17/16 0340 02/18/16 0430  PROCALCITON 11.92 11.54 7.55    ABG  Recent Labs Lab 02/12/16 1359 02/19/2016 1840 02/17/16 0434  PHART 7.508* 7.511* 7.489*  PCO2ART 34.0* 38.5 37.8  PO2ART 104* 94.0 96.0    Liver Enzymes  Recent Labs Lab 02/12/2016 0510 02/18/16 0430 02/19/16 0405  ALBUMIN 1.5* 1.5* 1.8*    Cardiac Enzymes  Recent Labs Lab  02/24/2016 2000 02/17/16 0005 02/17/16 0340  TROPONINI 0.22* 0.21* 0.22*    Glucose  Recent Labs Lab 02/18/16 1635 02/18/16 1945 02/18/16 2351 02/19/16 0347 02/19/16 0737 02/19/16 1117  GLUCAP 257* 259* 154* 98 82 74    Imaging Portable Chest Xray  02/19/2016  CLINICAL DATA:  Acute respiratory failure EXAM: PORTABLE CHEST 1 VIEW COMPARISON:  Chest x-ray from earlier same day and chest x-ray dated 02/18/2016. FINDINGS: Endotracheal tube remains well positioned with tip approximately 3 cm above the carina. Right IJ central line is stable in position with tip at the level of the cavoatrial junction. Mild cardiomegaly is stable. Overall cardiomediastinal silhouette is stable in size and configuration. Bilateral pleural effusions appear stable, both small to moderate in size. Additional patchy opacities at each lung base could represent pneumonia or atelectasis. Mild interstitial edema persist bilaterally. IMPRESSION: 1. Stable bilateral pleural effusions. Also stable patchy opacities at each lung base which could represent pneumonia or atelectasis. 2. Stable mild interstitial edema. 3. No new lung findings. 4. Support apparatus is stable in position. Electronically Signed   By: Bary Richard M.D.   On: 02/19/2016 12:05   Dg Chest Port 1 View  02/19/2016  CLINICAL DATA: Respiratory failure. EXAM: PORTABLE CHEST 1 VIEW COMPARISON:  02/18/2016. FINDINGS: Right IJ line and NG tube and stable position. Cardiomegaly with pulmonary vascular prominence bilateral interstitial prominence bilateral pleural effusions consistent congestive heart failure. Findings have progressed from prior exam. No pneumothorax. Stomach is slightly distended. IMPRESSION: 1. Lines and tubes in stable position. NG tube noted with tip below left hemidiaphragm. Mild gastric distention however is noted. 2. Cardiomegaly with pulmonary vascular prominence, bilateral interstitial prominence and bilateral pleural effusions consistent  congestive heart failure. Electronically Signed   By: Maisie Fus  Register   On: 02/19/2016 07:13     STUDIES:  7/13 Echo >> EF 40 to 45%, mod LVH, small pericardial effusion 7/17 CXR >> stable bilateral pleural effusions, stable patchy opacities in the lung bases- pneumonia vs atelectasis   CULTURES: BCX2 7/10:  Veillonella species (Gram negative coccobacilli) resp culture 7/10: Moderate SA and E.coli (both pan sensitive)  ANTIBIOTICS: cipro 7/12>>>7/14 vanc 7/2>>> Cefepime 7/14>>> Fluconazole 7/7>>>7/17 Flagyl 7/17 >>  SIGNIFICANT EVENTS: 7/14 Transfer from [facility] to [facility]  LINES/TUBES: OETT 7/10>>> Right IJ PICC (? Insertion date)>>>  DISCUSSION: 40 yo male was at [facility] with PNA, VDRF and failure to wean.  Transferred to [facility] for rehab and weaning.  Developed bacteremia with HCAP in [facility] and recurrent VDRF.  Developed NSTEMI and new murmur.  Transferred to [facility] for further management.   ASSESSMENT / PLAN:  PULMONARY A: Acute hypoxic respiratory failure  HCAP Bilateral pleural effusions w/ recent parapneumonic sample from right effusion  P:   Re-intubated this morning Full vent support. Wean as able. Solumedrol 40mg  IV q8hrs  CARDIOVASCULAR A:  Sepsis  Recent NSTEMI vs demand ischemia. Acute systolic CHF Small pericardial effusion with friction rub- infectious vs autoimmune pericarditis? P:  Cardiology consulted, appreciate assistance. May need to consider cardiac cath when medical issues improve. May want to consider TEE but respiratory status to tenuous at present. Repeat Echo Repeat EKG to look for diffuse ST elevations ANA to rule out autoimmune process Metoprolol IV 5mg  q6hrs  RENAL A:   ESRD HD dependent  P:   Nephrology consulted, appreciate assistance. Plan for HD today. Replace electrolytes as needed  GASTROINTESTINAL A:   Recent GIB on LMWH P:   Trend cbc closely  PPI via NGT Tube feeds  HEMATOLOGIC A:   Anemia of critical illness P:  Trend  CBC SCDs  INFECTIOUS A:   Bacteremia with Veillonella species HCAP P:   Vanc, Cefepime, and Flagyl ID consulted, appreciate assistance  ENDOCRINE A:   DM type II w/ hyperglycemia  Secondary hyperparathyroidism  Steroid dependence per previous notes- unclear why? P:   Sensitive SSI  NEUROLOGIC A:   Acute Encephalopathy in setting of critical illness.  P:   RASS goal: -1 PAD protocol    Willadean Carol, MD Family Medicine, PGY-2

## 2016-02-19 NOTE — Progress Notes (Signed)
Patient ID: Tanner Campbell, male   DOB: 1976/02/26, 40 y.o.   MRN: [id number] S: Self extubated overnight- remained extubated although aspiration is presumed - VSS   O:BP 128/37 mmHg  Pulse 83  Temp(Src) 98.4 F (36.9 C) (Oral)  Resp 24  Wt 59.9 kg (132 lb 0.9 oz)  SpO2 100%  Intake/Output Summary (Last 24 hours) at 02/19/16 0710 Last data filed at 02/18/16 2100  Gross per 24 hour  Intake 1513.17 ml  Output      0 ml  Net 1513.17 ml   Intake/Output: I/O last 3 completed shifts: In: 1966.1 [I.V.:481.1; NG/GT:1485] Out: -   Intake/Output this shift:    Weight change: 0.9 kg (1 lb 15.8 oz) General appearance: extubated- tracks me- yes and no responses  Resp: occassional rhonchi Cardio: systolic murmur: holosystolic 3/6, blowing throughout the precordium and friction rub heard throughout precordium GI: soft, non-tender; bowel sounds normal; no masses, no organomegaly Extremities: LUE AVF +T/B, no edema Dialysis Access: LUE AVF   Recent Labs Lab 02/13/16 0537  02/14/16 0544 02/15/16 0637 03/02/2016 0510 02/17/16 0340 02/17/16 1030 02/17/16 1723 02/18/16 0430 02/18/16 1700 02/19/16 0405  NA 133*  --  133* 131* 129* 139  --   --  138  --  139  K 4.9  --  4.5 3.7 3.9 3.9  --   --  3.8  --  4.9  CL 98*  --  95* 94* 90* 98*  --   --  100*  --  98*  CO2 23  --  25 26 24 29   --   --  28  --  29  GLUCOSE 240*  --  196* 248* 274* 180*  --   --  128*  --  98  BUN 70*  --  104* 56* 87* 55*  --   --  78*  --  99*  CREATININE 3.53*  --  4.34* 2.87* 3.57* 2.67*  --   --  3.65*  --  4.43*  ALBUMIN  --   --  1.8*  --  1.5*  --   --   --  1.5*  --  1.8*  CALCIUM 7.7*  --  8.3* 7.9* 7.9* 8.1*  --   --  7.7*  --  7.8*  PHOS  --   < > 5.4*  --  3.1 3.1 3.6 4.2 4.4 5.3* 6.2*  < > = values in this interval not displayed. Liver Function Tests:  Recent Labs Lab 02/26/2016 0510 02/18/16 0430 02/19/16 0405  ALBUMIN 1.5* 1.5* 1.8*   No results for input(s): LIPASE, AMYLASE in the last  168 hours. No results for input(s): AMMONIA in the last 168 hours. CBC:  Recent Labs Lab 02/15/16 0637 02/22/2016 0510 02/17/16 0340 02/18/16 0430 02/19/16 0405  WBC 16.7* 21.8* 19.5* 17.7* 18.2*  HGB 8.7* 8.4* 8.6* 8.2* 8.2*  HCT 28.6* 26.5* 28.7* 26.9* 27.1*  MCV 87.5 85.5 87.8 88.5 88.6  PLT 248 226 200 174 167   Cardiac Enzymes:  Recent Labs Lab 02/12/16 1717 02/13/16 0537 02/15/2016 2000 02/17/16 0005 02/17/16 0340  TROPONINI 0.50* 0.40* 0.22* 0.21* 0.22*   CBG:  Recent Labs Lab 02/19/2016 1742  GLUCAP 308*    Iron Studies: No results for input(s): IRON, TIBC, TRANSFERRIN, FERRITIN in the last 72 hours. Studies/Results: Dg Chest Port 1 View  02/18/2016  CLINICAL DATA:  Respiratory failure EXAM: PORTABLE CHEST 1 VIEW COMPARISON:  CT chest 03/03/2016.  One-view chest x-ray 02/04/2016. FINDINGS: Endotracheal tube  is stable. The NG tube courses off the inferior border the film. A right IJ line is stable. Bilateral pleural effusions and basilar airspace disease is unchanged. Mild edema is stable. IMPRESSION: 1. Stable bilateral pleural effusions and airspace disease. 2. Support apparatus is stable. Electronically Signed   By: San Morelle M.D.   On: 02/18/2016 08:48   . antiseptic oral rinse  7 mL Mouth Rinse QID  . atorvastatin  80 mg Per Tube q1800  . ceFEPime (MAXIPIME) IV  2 g Intravenous Q M,W,F-1800  . chlorhexidine gluconate (SAGE KIT)  15 mL Mouth Rinse BID  . insulin aspart  0-9 Units Subcutaneous Q4H  . insulin glargine  10 Units Subcutaneous BID  . metoprolol tartrate  50 mg Per Tube BID  . multivitamin  1 tablet Oral QHS  . pantoprazole sodium  40 mg Per Tube BID  . predniSONE  20 mg Per Tube Q breakfast  . vancomycin  750 mg Intravenous Q M,W,F-HD    BMET    Component Value Date/Time   NA 139 02/19/2016 0405   K 4.9 02/19/2016 0405   CL 98* 02/19/2016 0405   CO2 29 02/19/2016 0405   GLUCOSE 98 02/19/2016 0405   BUN 99* 02/19/2016 0405    CREATININE 4.43* 02/19/2016 0405   CALCIUM 7.8* 02/19/2016 0405   CALCIUM 8.5* 02/03/2016 0600   GFRNONAA 15* 02/19/2016 0405   GFRAA 18* 02/19/2016 0405   CBC    Component Value Date/Time   WBC 18.2* 02/19/2016 0405   RBC 3.06* 02/19/2016 0405   HGB 8.2* 02/19/2016 0405   HCT 27.1* 02/19/2016 0405   PLT 167 02/19/2016 0405   MCV 88.6 02/19/2016 0405   MCH 26.8 02/19/2016 0405   MCHC 30.3 02/19/2016 0405   RDW 16.9* 02/19/2016 0405   LYMPHSABS 0.6* 02/11/2016 0600   MONOABS 0.4 02/11/2016 0600   EOSABS 0.0 02/11/2016 0600   BASOSABS 0.0 02/11/2016 0600    Dialysis Orders: Center: [facility] on MWF .   Assessment/Plan: 1. VDRF - transferred from [facility] due to prolonged respiratory failure. PCCM managing- now self extubated- so far has been able to stay liberated from vent 2. Heart murmer and friction rub - ECHO performed 02/21/2016 with worsened LV function and wall motion abnormality suggesting ischemia or infarct. Unclear why murmer more pronounced and with friction rub. Considering TEE to r/o endocarditis.  Cardiology following due to wall motion abnormality and for cardiac cath when stable 3. ESRD - Continue with HD MWF- due today via AVF 4. Hypertension/volume - stable- really no volume on board 5. Anemia - on Aranesp  increased to 18mg and check iron stores tomorrow 6. Metabolic bone disease - Will follow ca/phos- phos climbing, calc low-  will resume binder when eating 7. Nutrition - per primary svc 8. ID- bacteremia- GNR as of 7/10- on cefepime and vanc  9. [employer]- has palliative care ever been involved in this case ? Missed HD and self extubation maybe he is trying to tell u[place]  Rasheena Talmadge A

## 2016-02-19 NOTE — Care Management Important Message (Signed)
Important Message  Patient Details  Name: Tanner Campbell MRN: [id number] Date of Birth: 09-07-1975   Medicare Important Message Given:  Yes    [name] 02/19/2016, 2:55 PM

## 2016-02-19 NOTE — Progress Notes (Signed)
Patient with labored breathing, using accessory muscles. Pupils irregularly shaped, sluggish to react, MD notified of changes. Will continue to monitor.

## 2016-02-19 NOTE — Progress Notes (Signed)
On assessment patient noted to have agonal respirations, pulse ox reading in lower 80's, patient not responding to verbal stimuli or sternal rub. Patient was bagged and respiratory and MD were notified, patient re-intubated. Will continue to monitor.

## 2016-02-19 NOTE — Progress Notes (Signed)
eLink Physician-Brief Progress Note Patient Name: Tanner Campbell DOB: 01-07-1976 MRN: [id number]   Date of Service  02/19/2016  HPI/Events of Note  Self extubated Stable on 3LNC, good cough  eICU Interventions  Stop fentanyl gtt Monitor      Intervention Category Major Interventions: Respiratory failure - evaluation and management Intermediate Interventions: Change in mental status - evaluation and management  Max Fickle 02/19/2016, 12:15 AM

## 2016-02-19 NOTE — Progress Notes (Signed)
PT Cancellation Note  Patient Details Name: Tanner Campbell MRN: [id number] DOB: 1975/10/28   Cancelled Treatment:    Reason Eval/Treat Not Completed: Medical issues which prohibited therapy (Pt just reintubated, will check back tomorrow per nurse. )Thanks.   [name] F 02/19/2016, 12:00 PM Deshae Dickison,PT Acute Rehabilitation [phone] (pager)

## 2016-02-19 NOTE — Procedures (Signed)
Intubation Procedure Note Devario Bucklew [id number] 30-Nov-1975  Procedure: Intubation Indications: Respiratory insufficiency  Procedure Details Consent: Risks of procedure as well as the alternatives and risks of each were explained to the (patient/caregiver).  Consent for procedure obtained. Time Out: Verified patient identification, verified procedure, site/side was marked, verified correct patient position, special equipment/implants available, medications/allergies/relevent history reviewed, required imaging and test results available.  Performed  Maximum sterile technique was used including gloves, gown and mask.  MAC and 4  Versed 1 fent 100 Etomidate 20  Evaluation Hemodynamic Status: Transient hypotension treated with fluid; O2 sats: stable throughout Patient's Current Condition: stable Complications: No apparent complications Patient did tolerate procedure well. Chest X-ray ordered to verify placement.  CXR: pending.   ALVA,RAKESH V. 02/19/2016

## 2016-02-19 NOTE — Consult Note (Signed)
Reason for Consult: Respiratory failure Referring Physician: Hyman Bible, MD  HPI:  Tanner Campbell is an 40 y.o. 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 He was intubated 6/23-7/5 (self extubated), 7/10-7/16 (self extubated), now reintubated. Initially, sputum was positive for H influenza and subsequently for MSSA and Escherichia coli and blood cultures growing a gram-negative anaerobic organism. Thoracentesis on right showed borderline exudate by LDH criteria.   Past Medical History  Diagnosis Date  . Diabetes mellitus without complication ([place])   . Hypertension   . COPD (chronic obstructive pulmonary disease) ([place])   . Renal disorder   . Neuropathy ([place])   . Hemodialysis patient ([place])   . High cholesterol     Past Surgical History  Procedure Laterality Date  . Cholecystectomy    . Back surgery    . Neck surgery      Family History  Problem Relation Age of Onset  . Rheum arthritis Neg Hx   . Osteoarthritis Neg Hx   . Asthma Neg Hx   . Cancer Neg Hx   . Diabetes Neg Hx     Social History:  reports that he has been smoking.  He does not have any smokeless tobacco history on file. He reports that he does not drink alcohol or use illicit drugs.  Allergies:  Allergies  Allergen Reactions  . Penicillins Other (See Comments)    Reaction: Unknown    Prior to Admission medications   Medication Sig Start Date End Date Taking? Authorizing Provider  albuterol (PROVENTIL HFA;VENTOLIN HFA) 108 (90 Base) MCG/ACT inhaler Inhale 1-2 puffs into the lungs every 6 (six) hours as needed for wheezing or shortness of breath.    Historical Provider, MD  amLODipine (NORVASC) 5 MG tablet Take 5 mg by mouth daily.    Historical Provider, MD  atorvastatin (LIPITOR) 40 MG tablet Take 40 mg by mouth every evening.    Historical Provider, MD   calcium acetate (PHOSLO) 667 MG capsule Take 2,001 mg by mouth 3 (three) times daily with meals.    Historical Provider, MD  carvedilol (COREG) 25 MG tablet Take 25 mg by mouth 2 (two) times daily with a meal.    Historical Provider, MD  DULoxetine (CYMBALTA) 30 MG capsule Take 30 mg by mouth daily.    Historical Provider, MD  Ferric Citrate (AURYXIA PO) Take 1 tablet by mouth daily.     Historical Provider, MD  folic acid-vitamin b complex-vitamin c-selenium-zinc (DIALYVITE) 3 MG TABS tablet Take 1 tablet by mouth daily.    Historical Provider, MD  furosemide (LASIX) 80 MG tablet Take 80 mg by mouth 2 (two) times daily.    Historical Provider, MD  gabapentin (NEURONTIN) 300 MG capsule Take 300 mg by mouth at bedtime.    Historical Provider, MD  lisinopril (PRINIVIL,ZESTRIL) 20 MG tablet Take 20 mg by mouth every evening.    Historical Provider, MD  risperiDONE (RISPERDAL) 0.5 MG tablet Take 0.5 mg by mouth at bedtime.    Historical Provider, MD    Medications:  I have reviewed the patient's current medications. Scheduled: . antiseptic oral rinse  7 mL Mouth Rinse QID  . atorvastatin  80 mg Per Tube q1800  . ceFEPime (MAXIPIME) IV  2 g Intravenous Q M,W,F-1800  . chlorhexidine gluconate (SAGE KIT)  15 mL Mouth  Rinse BID  . [START ON 02/21/2016] darbepoetin (ARANESP) injection - DIALYSIS  60 mcg Intravenous Q Wed-HD  . famotidine (PEPCID) IV  20 mg Intravenous Q12H  . feeding supplement (VITAL AF 1.2 CAL)  1,000 mL Per Tube Q24H  . insulin aspart  0-9 Units Subcutaneous Q4H  . methylPREDNISolone (SOLU-MEDROL) injection  40 mg Intravenous Q8H  . metronidazole  500 mg Intravenous Q8H  . multivitamin  1 tablet Oral QHS   DGL:OVFIEP chloride, sodium chloride, sodium chloride, alteplase, fentaNYL (SUBLIMAZE) injection, fentaNYL (SUBLIMAZE) injection, fentaNYL (SUBLIMAZE) injection, heparin, lidocaine (PF), lidocaine-prilocaine, metoprolol, midazolam, midazolam,  pentafluoroprop-tetrafluoroeth  Portable Chest Xray  02/19/2016  CLINICAL DATA:  Acute respiratory failure EXAM: PORTABLE CHEST 1 VIEW COMPARISON:  Chest x-ray from earlier same day and chest x-ray dated 02/18/2016. FINDINGS: Endotracheal tube remains well positioned with tip approximately 3 cm above the carina. Right IJ central line is stable in position with tip at the level of the cavoatrial junction. Mild cardiomegaly is stable. Overall cardiomediastinal silhouette is stable in size and configuration. Bilateral pleural effusions appear stable, both small to moderate in size. Additional patchy opacities at each lung base could represent pneumonia or atelectasis. Mild interstitial edema persist bilaterally. IMPRESSION: 1. Stable bilateral pleural effusions. Also stable patchy opacities at each lung base which could represent pneumonia or atelectasis. 2. Stable mild interstitial edema. 3. No new lung findings. 4. Support apparatus is stable in position. Electronically Signed   By: Franki Cabot M.D.   On: 02/19/2016 12:05   Dg Chest Port 1 View  02/19/2016  CLINICAL DATA: Respiratory failure. EXAM: PORTABLE CHEST 1 VIEW COMPARISON:  02/18/2016. FINDINGS: Right IJ line and NG tube and stable position. Cardiomegaly with pulmonary vascular prominence bilateral interstitial prominence bilateral pleural effusions consistent congestive heart failure. Findings have progressed from prior exam. No pneumothorax. Stomach is slightly distended. IMPRESSION: 1. Lines and tubes in stable position. NG tube noted with tip below left hemidiaphragm. Mild gastric distention however is noted. 2. Cardiomegaly with pulmonary vascular prominence, bilateral interstitial prominence and bilateral pleural effusions consistent congestive heart failure. Electronically Signed   By: Marcello Moores  Register   On: 02/19/2016 07:13   Dg Chest Port 1 View  02/18/2016  CLINICAL DATA:  Respiratory failure EXAM: PORTABLE CHEST 1 VIEW COMPARISON:  CT  chest 03/03/2016.  One-view chest x-ray 02/15/2016. FINDINGS: Endotracheal tube is stable. The NG tube courses off the inferior border the film. A right IJ line is stable. Bilateral pleural effusions and basilar airspace disease is unchanged. Mild edema is stable. IMPRESSION: 1. Stable bilateral pleural effusions and airspace disease. 2. Support apparatus is stable. Electronically Signed   By: San Morelle M.D.   On: 02/18/2016 08:48   Review of systems: Unable to obtain.  Blood pressure 99/65, pulse 88, temperature 97.4 F (36.3 C), temperature source Oral, resp. rate 14, weight 57.7 kg (127 lb 3.3 oz), SpO2 98 %. Physical Exam: General: Sedated, Intubated.  HEENT: ETT in place, pupils reactive. Ears: Normal auricles and EACs. Nose: Normal mucosa. Oral: ET tube in place. Neck:  Laryngeal landmarks palpable. Cardiovascular: Regular. Abdomen: Soft, non tender. Skin: No rashes  Assessment/Plan: Respiratory failure/pneumonia. The patient has failed multiple extubation attempts. Plan to proceed with tracheostomy tube placement tomorrow in the operating room.  Kerry Odonohue,SUI W 02/19/2016, 11:05 PM

## 2016-02-20 ENCOUNTER — Inpatient Hospital Stay (HOSPITAL_COMMUNITY): Payer: Medicare Other | Admitting: Certified Registered"

## 2016-02-20 ENCOUNTER — Encounter (HOSPITAL_COMMUNITY): Admission: AD | Disposition: E | Payer: Self-pay | Attending: Internal Medicine

## 2016-02-20 ENCOUNTER — Inpatient Hospital Stay (HOSPITAL_COMMUNITY): Payer: Medicare Other

## 2016-02-20 ENCOUNTER — Encounter (HOSPITAL_COMMUNITY): Payer: Self-pay | Admitting: Certified Registered Nurse Anesthetist

## 2016-02-20 DIAGNOSIS — R509 Fever, unspecified: Secondary | ICD-10-CM

## 2016-02-20 HISTORY — PX: TRACHEOSTOMY TUBE PLACEMENT: SHX814

## 2016-02-20 LAB — IRON AND TIBC
IRON: 15 ug/dL — AB (ref 45–182)
SATURATION RATIOS: 10 % — AB (ref 17.9–39.5)
TIBC: 151 ug/dL — AB (ref 250–450)
UIBC: 136 ug/dL

## 2016-02-20 LAB — RENAL FUNCTION PANEL
Albumin: 1.8 g/dL — ABNORMAL LOW (ref 3.5–5.0)
Anion gap: 14 (ref 5–15)
BUN: 45 mg/dL — AB (ref 6–20)
CHLORIDE: 99 mmol/L — AB (ref 101–111)
CO2: 27 mmol/L (ref 22–32)
Calcium: 8.2 mg/dL — ABNORMAL LOW (ref 8.9–10.3)
Creatinine, Ser: 2.7 mg/dL — ABNORMAL HIGH (ref 0.61–1.24)
GFR calc Af Amer: 32 mL/min — ABNORMAL LOW (ref >60)
GFR calc non Af Amer: 28 mL/min — ABNORMAL LOW (ref >60)
GLUCOSE: 90 mg/dL (ref 65–99)
POTASSIUM: 3.9 mmol/L (ref 3.5–5.1)
Phosphorus: 5 mg/dL — ABNORMAL HIGH (ref 2.5–4.6)
Sodium: 140 mmol/L (ref 135–145)

## 2016-02-20 LAB — GLUCOSE, CAPILLARY
GLUCOSE-CAPILLARY: 94 mg/dL (ref 65–99)
GLUCOSE-CAPILLARY: 94 mg/dL (ref 65–99)
Glucose-Capillary: 66 mg/dL (ref 65–99)
Glucose-Capillary: 78 mg/dL (ref 65–99)
Glucose-Capillary: 114 mg/dL — ABNORMAL HIGH (ref 65–99)
Glucose-Capillary: 78 mg/dL (ref 65–99)
Glucose-Capillary: 105 mg/dL — ABNORMAL HIGH (ref 65–99)

## 2016-02-20 LAB — CBC
HEMATOCRIT: 28.6 % — AB (ref 39.0–52.0)
Hemoglobin: 8.5 g/dL — ABNORMAL LOW (ref 13.0–17.0)
MCH: 26.5 pg (ref 26.0–34.0)
MCHC: 29.7 g/dL — AB (ref 30.0–36.0)
MCV: 89.1 fL (ref 78.0–100.0)
Platelets: 170 10*3/uL (ref 150–400)
RBC: 3.21 MIL/uL — ABNORMAL LOW (ref 4.22–5.81)
RDW: 16.8 % — AB (ref 11.5–15.5)
WBC: 23.1 10*3/uL — AB (ref 4.0–10.5)

## 2016-02-20 LAB — POCT I-STAT 3, ART BLOOD GAS (G3+)
Acid-Base Excess: 4 mmol/L — ABNORMAL HIGH (ref 0.0–2.0)
BICARBONATE: 29.7 meq/L — AB (ref 20.0–24.0)
O2 Saturation: 94 %
PO2 ART: 67 mmHg — AB (ref 80.0–100.0)
Patient temperature: 97.5
TCO2: 31 mmol/L (ref 0–100)
pCO2 arterial: 46.8 mmHg — ABNORMAL HIGH (ref 35.0–45.0)
pH, Arterial: 7.409 (ref 7.350–7.450)

## 2016-02-20 LAB — PROTIME-INR
INR: 1.13 (ref 0.00–1.49)
Prothrombin Time: 14.6 seconds (ref 11.6–15.2)

## 2016-02-20 LAB — ANTINUCLEAR ANTIBODIES, IFA: ANTINUCLEAR ANTIBODIES, IFA: NEGATIVE

## 2016-02-20 LAB — FERRITIN: Ferritin: 2416 ng/mL — ABNORMAL HIGH (ref 24–336)

## 2016-02-20 SURGERY — CREATION, TRACHEOSTOMY
Anesthesia: General | Site: Neck

## 2016-02-20 MED ORDER — PROPOFOL 10 MG/ML IV BOLUS
INTRAVENOUS | Status: DC | PRN
  Administered 2016-02-20: 60 mg via INTRAVENOUS
  Administered 2016-02-20: 30 mg via INTRAVENOUS

## 2016-02-20 MED ORDER — ROCURONIUM BROMIDE 50 MG/5ML IV SOLN
INTRAVENOUS | Status: AC
  Filled 2016-02-20: qty 1

## 2016-02-20 MED ORDER — FENTANYL CITRATE (PF) 250 MCG/5ML IJ SOLN
INTRAMUSCULAR | Status: AC
  Filled 2016-02-20: qty 5

## 2016-02-20 MED ORDER — METHYLPREDNISOLONE SODIUM SUCC 40 MG IJ SOLR
40.0000 mg | Freq: Two times a day (BID) | INTRAMUSCULAR | Status: DC
  Administered 2016-02-20 – 2016-02-21 (×2): 40 mg via INTRAVENOUS
  Filled 2016-02-20 (×2): qty 1

## 2016-02-20 MED ORDER — FENTANYL CITRATE (PF) 100 MCG/2ML IJ SOLN
INTRAMUSCULAR | Status: DC | PRN
  Administered 2016-02-20: 50 ug via INTRAVENOUS

## 2016-02-20 MED ORDER — SODIUM CHLORIDE 0.9 % IV SOLN
INTRAVENOUS | Status: DC | PRN
  Administered 2016-02-20: 18:00:00 via INTRAVENOUS

## 2016-02-20 MED ORDER — LIDOCAINE-EPINEPHRINE 1 %-1:100000 IJ SOLN
INTRAMUSCULAR | Status: DC | PRN
  Administered 2016-02-20: 20 mL

## 2016-02-20 MED ORDER — PROPOFOL 500 MG/50ML IV EMUL
INTRAVENOUS | Status: DC | PRN
  Administered 2016-02-20: 50 ug/kg/min via INTRAVENOUS

## 2016-02-20 MED ORDER — PROPOFOL 10 MG/ML IV BOLUS
INTRAVENOUS | Status: AC
  Filled 2016-02-20: qty 20

## 2016-02-20 MED ORDER — LIDOCAINE-EPINEPHRINE 1 %-1:100000 IJ SOLN
INTRAMUSCULAR | Status: AC
  Filled 2016-02-20: qty 1

## 2016-02-20 MED ORDER — DARBEPOETIN ALFA 100 MCG/0.5ML IJ SOSY
100.0000 ug | PREFILLED_SYRINGE | INTRAMUSCULAR | Status: DC
  Administered 2016-02-21: 100 ug via INTRAVENOUS
  Filled 2016-02-20: qty 0.5

## 2016-02-20 MED ORDER — FAMOTIDINE IN NACL 20-0.9 MG/50ML-% IV SOLN
20.0000 mg | INTRAVENOUS | Status: DC
  Administered 2016-02-21: 20 mg via INTRAVENOUS
  Filled 2016-02-20 (×2): qty 50

## 2016-02-20 MED ORDER — MIDAZOLAM HCL 2 MG/2ML IJ SOLN
INTRAMUSCULAR | Status: AC
  Filled 2016-02-20: qty 2

## 2016-02-20 MED ORDER — MIDAZOLAM HCL 5 MG/5ML IJ SOLN
INTRAMUSCULAR | Status: DC | PRN
  Administered 2016-02-20: 2 mg via INTRAVENOUS

## 2016-02-20 MED ORDER — ROCURONIUM BROMIDE 100 MG/10ML IV SOLN
INTRAVENOUS | Status: DC | PRN
  Administered 2016-02-20: 40 mg via INTRAVENOUS
  Administered 2016-02-20: 10 mg via INTRAVENOUS

## 2016-02-20 MED ORDER — 0.9 % SODIUM CHLORIDE (POUR BTL) OPTIME
TOPICAL | Status: DC | PRN
  Administered 2016-02-20: 1000 mL

## 2016-02-20 MED ORDER — PHENYLEPHRINE HCL 10 MG/ML IJ SOLN
INTRAMUSCULAR | Status: DC | PRN
  Administered 2016-02-20: 40 ug via INTRAVENOUS
  Administered 2016-02-20: 120 ug via INTRAVENOUS
  Administered 2016-02-20: 80 ug via INTRAVENOUS
  Administered 2016-02-20: 120 ug via INTRAVENOUS

## 2016-02-20 SURGICAL SUPPLY — 43 items
BLADE 10 SAFETY STRL DISP (BLADE) IMPLANT
BLADE 11 SAFETY STRL DISP (BLADE) IMPLANT
BLADE 15 SAFETY STRL DISP (BLADE) IMPLANT
BLADE SURG 15 STRL LF DISP TIS (BLADE) IMPLANT
BLADE SURG 15 STRL SS (BLADE)
BLADE SURG ROTATE 9660 (MISCELLANEOUS) IMPLANT
CANISTER SUCTION 2500CC (MISCELLANEOUS) ×3 IMPLANT
CLEANER TIP ELECTROSURG 2X2 (MISCELLANEOUS) ×3 IMPLANT
COVER SURGICAL LIGHT HANDLE (MISCELLANEOUS) ×3 IMPLANT
DECANTER SPIKE VIAL GLASS SM (MISCELLANEOUS) ×3 IMPLANT
DRAPE PROXIMA HALF (DRAPES) IMPLANT
ELECT COATED BLADE 2.86 ST (ELECTRODE) ×3 IMPLANT
ELECT REM PT RETURN 9FT ADLT (ELECTROSURGICAL) ×3
ELECTRODE REM PT RTRN 9FT ADLT (ELECTROSURGICAL) ×1 IMPLANT
GAUZE SPONGE 4X4 16PLY XRAY LF (GAUZE/BANDAGES/DRESSINGS) IMPLANT
GAUZE XEROFORM 5X9 LF (GAUZE/BANDAGES/DRESSINGS) IMPLANT
GLOVE ECLIPSE 7.5 STRL STRAW (GLOVE) ×3 IMPLANT
GOWN STRL REUS W/ TWL LRG LVL3 (GOWN DISPOSABLE) ×3 IMPLANT
GOWN STRL REUS W/TWL LRG LVL3 (GOWN DISPOSABLE) ×6
HOLDER TRACH TUBE VELCRO 19.5 (MISCELLANEOUS) ×3 IMPLANT
KIT BASIN OR (CUSTOM PROCEDURE TRAY) ×3 IMPLANT
KIT ROOM TURNOVER OR (KITS) ×3 IMPLANT
KIT SUCTION CATH 14FR (SUCTIONS) IMPLANT
NEEDLE HYPO 25GX1X1/2 BEV (NEEDLE) ×3 IMPLANT
NS IRRIG 1000ML POUR BTL (IV SOLUTION) ×3 IMPLANT
PAD ARMBOARD 7.5X6 YLW CONV (MISCELLANEOUS) ×6 IMPLANT
PENCIL BUTTON HOLSTER BLD 10FT (ELECTRODE) IMPLANT
SPONGE DRAIN TRACH 4X4 STRL 2S (GAUZE/BANDAGES/DRESSINGS) ×3 IMPLANT
SPONGE INTESTINAL PEANUT (DISPOSABLE) ×3 IMPLANT
SUT CHROMIC 3 0 SH 27 (SUTURE) IMPLANT
SUT PROLENE 2 0 SH DA (SUTURE) ×3 IMPLANT
SUT SILK 3 0 (SUTURE) ×2
SUT SILK 3-0 18XBRD TIE 12 (SUTURE) ×1 IMPLANT
SYR 20CC LL (SYRINGE) IMPLANT
SYR BULB 3OZ (MISCELLANEOUS) IMPLANT
SYR CONTROL 10ML LL (SYRINGE) IMPLANT
TOWEL OR 17X24 6PK STRL BLUE (TOWEL DISPOSABLE) ×3 IMPLANT
TRAY ENT MC OR (CUSTOM PROCEDURE TRAY) ×3 IMPLANT
TUBE CONNECTING 12'X1/4 (SUCTIONS) ×1
TUBE CONNECTING 12X1/4 (SUCTIONS) ×2 IMPLANT
TUBE TRACH SHILEY  6 DIST  CUF (TUBING) ×3 IMPLANT
TUBE TRACH SHILEY 8 DIST CUF (TUBING) ×3 IMPLANT
WATER STERILE IRR 1000ML POUR (IV SOLUTION) ×3 IMPLANT

## 2016-02-20 NOTE — Anesthesia Preprocedure Evaluation (Signed)
Anesthesia Evaluation  Patient identified by MRN, date of birth, ID band  Reviewed: Allergy & Precautions, NPO status , Patient's Chart, lab work & pertinent test results  Airway      Mouth opening: Limited Mouth Opening  Dental   Pulmonary Current Smoker,     + decreased breath sounds      Cardiovascular hypertension,  Rhythm:Regular Rate:Normal     Neuro/Psych    GI/Hepatic   Endo/Other  diabetes  Renal/GU      Musculoskeletal   Abdominal   Peds  Hematology   Anesthesia Other Findings   Reproductive/Obstetrics                             Anesthesia Physical Anesthesia Plan  ASA: III  Anesthesia Plan: General   Post-op Pain Management:    Induction: Intravenous  Airway Management Planned: Oral ETT and Tracheostomy  Additional Equipment:   Intra-op Plan:   Post-operative Plan:   Informed Consent: I have reviewed the patients History and Physical, chart, labs and discussed the procedure including the risks, benefits and alternatives for the proposed anesthesia with the patient or authorized representative who has indicated his/her understanding and acceptance.     Plan Discussed with: CRNA and Anesthesiologist  Anesthesia Plan Comments:         Anesthesia Quick Evaluation

## 2016-02-20 NOTE — Progress Notes (Signed)
[facility]   Reason for visit: Follow up on Veillonella bacteremia  Interval History: Tmax 100.6.  Remains intubated.  To get Trach by ENT.  No acute events.  Repeat blood cultures ordered.   Physical Exam: Constitutional:  Filed Vitals:   02/11/2016 0800 02/21/2016 0914  BP: 93/38 76/58  Pulse: 93 94  Temp:    Resp:  14   patient appears intubated, sedated Respiratory: on vent; CTA B Cardiovascular: RRR GI: soft, nt  Review of Systems: Unable to be assessed due to mental status  Lab Results  Component Value Date   WBC 23.1* 02/09/2016   HGB 8.5* 02/10/2016   HCT 28.6* 02/18/2016   MCV 89.1 02/21/2016   PLT 170 03/01/2016    Lab Results  Component Value Date   CREATININE 2.70* 02/07/2016   BUN 45* 02/06/2016   NA 140 02/13/2016   K 3.9 02/15/2016   CL 99* 03/01/2016   CO2 27 03/04/2016    Lab Results  Component Value Date   ALT 50 02/11/2016   AST 42* 02/11/2016   ALKPHOS 281* 02/11/2016     Microbiology: Recent Results (from the past 240 hour(s))  Culture, respiratory (NON-Expectorated)     Status: None   Collection Time: 02/12/16  1:59 PM  Result Value Ref Range Status   Specimen Description TRACHEAL ASPIRATE  Final   Special Requests NONE  Final   Gram Stain   Final    ABUNDANT WBC PRESENT,BOTH PMN AND MONONUCLEAR ABUNDANT GRAM NEGATIVE RODS FEW GRAM POSITIVE COCCI IN PAIRS    Culture   Final    MODERATE STAPHYLOCOCCUS AUREUS MODERATE ESCHERICHIA COLI    Report Status 02/15/2016 FINAL  Final   Organism ID, Bacteria ESCHERICHIA COLI  Final   Organism ID, Bacteria STAPHYLOCOCCUS AUREUS  Final      Susceptibility   Staphylococcus aureus - MIC*    CIPROFLOXACIN <=0.5 SENSITIVE Sensitive     ERYTHROMYCIN 0.5 SENSITIVE Sensitive     GENTAMICIN <=0.5 SENSITIVE Sensitive     OXACILLIN <=0.25 SENSITIVE Sensitive     TETRACYCLINE <=1 SENSITIVE Sensitive     VANCOMYCIN 1 SENSITIVE Sensitive     TRIMETH/SULFA <=10 SENSITIVE  Sensitive     CLINDAMYCIN <=0.25 SENSITIVE Sensitive     RIFAMPIN <=0.5 SENSITIVE Sensitive     Inducible Clindamycin NEGATIVE Sensitive     * MODERATE STAPHYLOCOCCUS AUREUS   Escherichia coli - MIC*    AMPICILLIN >=32 RESISTANT Resistant     CEFAZOLIN <=4 SENSITIVE Sensitive     CEFEPIME <=1 SENSITIVE Sensitive     CEFTAZIDIME <=1 SENSITIVE Sensitive     CEFTRIAXONE <=1 SENSITIVE Sensitive     CIPROFLOXACIN <=0.25 SENSITIVE Sensitive     GENTAMICIN <=1 SENSITIVE Sensitive     IMIPENEM <=0.25 SENSITIVE Sensitive     TRIMETH/SULFA >=320 RESISTANT Resistant     AMPICILLIN/SULBACTAM >=32 RESISTANT Resistant     PIP/TAZO <=4 SENSITIVE Sensitive     * MODERATE ESCHERICHIA COLI  Culture, blood (routine x 2)     Status: Abnormal (Preliminary result)   Collection Time: 02/12/16  4:00 PM  Result Value Ref Range Status   Specimen Description BLOOD RIGHT HAND  Final   Special Requests BOTTLES DRAWN AEROBIC AND ANAEROBIC 5CC  Final   Culture  Setup Time   Final    GRAM NEGATIVE COCCOBACILLI ANAEROBIC BOTTLE ONLY CRITICAL RESULT CALLED TO, READ BACK BY AND VERIFIED WITH: J. Erby Pian, RN AT 1415 ON  02/14/16 BY  Jamse Arn, [place].    Culture (A)  Final    VEILLONELLA SPECIES ANAEROBIC GRAM NEGATIVE COCCOBACILLI IDENTIFICATION TO FOLLOW    Report Status PENDING  Incomplete  Blood Culture ID Panel (Reflexed)     Status: None   Collection Time: 02/12/16  4:00 PM  Result Value Ref Range Status   Enterococcus species NOT DETECTED NOT DETECTED Final   Vancomycin resistance NOT DETECTED NOT DETECTED Final   Listeria monocytogenes NOT DETECTED NOT DETECTED Final   Staphylococcus species NOT DETECTED NOT DETECTED Final   Staphylococcus aureus NOT DETECTED NOT DETECTED Final   Methicillin resistance NOT DETECTED NOT DETECTED Final   Streptococcus species NOT DETECTED NOT DETECTED Final   Streptococcus agalactiae NOT DETECTED NOT DETECTED Final   Streptococcus pneumoniae NOT DETECTED NOT DETECTED Final    Streptococcus pyogenes NOT DETECTED NOT DETECTED Final   Acinetobacter baumannii NOT DETECTED NOT DETECTED Final   Enterobacteriaceae species NOT DETECTED NOT DETECTED Final   Enterobacter cloacae complex NOT DETECTED NOT DETECTED Final   Escherichia coli NOT DETECTED NOT DETECTED Final   Klebsiella oxytoca NOT DETECTED NOT DETECTED Final   Klebsiella pneumoniae NOT DETECTED NOT DETECTED Final   Proteus species NOT DETECTED NOT DETECTED Final   Serratia marcescens NOT DETECTED NOT DETECTED Final   Carbapenem resistance NOT DETECTED NOT DETECTED Final   Haemophilus influenzae NOT DETECTED NOT DETECTED Final   Neisseria meningitidis NOT DETECTED NOT DETECTED Final   Pseudomonas aeruginosa NOT DETECTED NOT DETECTED Final   Candida albicans NOT DETECTED NOT DETECTED Final   Candida glabrata NOT DETECTED NOT DETECTED Final   Candida krusei NOT DETECTED NOT DETECTED Final   Candida parapsilosis NOT DETECTED NOT DETECTED Final   Candida tropicalis NOT DETECTED NOT DETECTED Final  Culture, blood (routine x 2)     Status: Abnormal (Preliminary result)   Collection Time: 02/12/16  4:07 PM  Result Value Ref Range Status   Specimen Description BLOOD RIGHT HAND  Final   Special Requests BOTTLES DRAWN AEROBIC AND ANAEROBIC 5CC  Final   Culture  Setup Time   Final    GRAM NEGATIVE COCCOBACILLI ANAEROBIC BOTTLE ONLY CRITICAL RESULT CALLED TO, READ BACK BY AND VERIFIED WITH: J. FURIGAY, RN AT 1415 ON  02/14/16 BY Jamse Arn, MT.    Culture (A)  Final    VEILLONELLA SPECIES ANAEROBIC GRAM NEGATIVE COCCOBACILLI IDENTIFICATION TO FOLLOW    Report Status PENDING  Incomplete  MRSA PCR Screening     Status: None   Collection Time: 02/12/2016  6:48 PM  Result Value Ref Range Status   MRSA by PCR NEGATIVE NEGATIVE Final    Comment:        The GeneXpert MRSA Assay (FDA approved for NASAL specimens only), is one component of a comprehensive MRSA colonization surveillance program. It is not intended to  diagnose MRSA infection nor to guide or monitor treatment for MRSA infections.     Impression/Plan:  1. Bacteremia - TTE ordered.  On flagyl.  Continue flagyl for 2 weeks total as long as repeat blood cultures remain negative.  2. Respiratory failure - possible pneumonia.  Continue cefepime 8 days total.

## 2016-02-20 NOTE — Progress Notes (Signed)
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 He was admitted by the hospitalist service for further evaluation. He was treated with BiPAP on admit. Unfortunately, he decompensated 6/23 and required intubation. The patient was found to have H. Influenza positive sputum and treated with rocephin. CXR demonstrated a large right pleural effusion and underwent a thoracentesis (1L serous fluid removed, likely exudative by protein). He had difficulty with weaning and was transferred to [facility] on 6/29 orally intubated for further evaluation / ventilator weaning. The patient developed fever and antibiotics were expanded to vancomycin, cefepime and diflucan. He had progressed to weaning on PSV x 4 hours as of 7/3. He self extubated on 7/5. Since that time has required BiPAP off and on. After HD 7/10 his mental status worsened and he became minimally responsive. CXR consistent with RLL opacification and he was re-intubated. He was re-cultured at this point, and Also was found to have increased Troponin for which cardiology was consulted. He was placed on LMWH and apparently had GIB while on this and his hgb dropped as low as 8.4 from 9.3. As of 7/14 he remains ventilator dependent. His sputum cultures are growing MSSA and e coli and also has positive blood cultures. Because of all of his overwhelming issues it was felt best to transfer him to a higher level of care.   SUBJECTIVE:  Pt self-extubated this morning. He was unable to answer questions secondary to dyspnea. During rounds, he developed  agonal breathing. He was subsequently intubated.  VITAL SIGNS: BP 138/70 mmHg  Pulse 121  Temp(Src) 99 F (37.2 C) (Oral)  Resp 14  Wt 56.9 kg (125 lb 7.1 oz)  SpO2 100%  VENTILATOR SETTINGS: Vent Mode:  [-] PRVC FiO2 (%):  [40 %] 40 % Set Rate:  [14 bmp] 14 bmp Vt Set:  [500 mL] 500 mL PEEP:  [5 cmH20] 5 cmH20 Plateau Pressure:  [20 cmH20-24 cmH20] 24 cmH20  INTAKE / OUTPUT: I/O last 3 completed shifts: In: 1415 [I.V.:530; NG/GT:335; IV Piggyback:550] Out: 2700 [Emesis/NG output:700; Other:2000]  PHYSICAL EXAMINATION: General: ill appearing, cachetic  Neuro: Sedated HEENT: ETT in place Cardiac: regular, friction rub auscultated Chest: no wheeze Abd: soft, non tender Ext: no edema, decreased muscle bulk Skin: multiple areas of healing excoriations on the lower extremities  LABS:  BMET  Recent Labs Lab 02/18/16 0430 02/19/16 0405 02/22/2016 0353  NA 138 139 140  K 3.8 4.9 3.9  CL 100* 98* 99*  CO2 BUN 78* 99* 45*  CREATININE 3.65* 4.43* 2.70*  GLUCOSE 128* 98 90    Electrolytes  Recent Labs Lab 02/17/16 1723 02/18/16 0430 02/18/16 1700 02/19/16 0405 03/01/2016 0353  CALCIUM  --  7.7*  --  7.8* 8.2*  MG 2.1 2.1 2.1  --   --   PHOS 4.2 4.4 5.3* 6.2* 5.0*    CBC  Recent Labs Lab 02/18/16 0430 02/19/16 0405 02/03/2016 0352  WBC 17.7* 18.2* 23.1*  HGB 8.2* 8.2* 8.5*  HCT 26.9* 27.1* 28.6*  PLT 174  167 170    Coag's  Recent Labs Lab 02/06/2016 2000 02/27/2016 0352  APTT 33  --   INR 1.15 1.13    Sepsis Markers  Recent Labs Lab 02/15/2016 2000 02/17/16 0340 02/18/16 0430  PROCALCITON 11.92 11.54 7.55    ABG  Recent Labs Lab 02/13/2016 1840 02/17/16 0434 02/19/16 1609  PHART 7.511* 7.489* 7.409  PCO2ART 38.5 37.8 46.8*  PO2ART 94.0 96.0 67.0*    Liver Enzymes  Recent Labs Lab 02/18/16 0430 02/19/16 0405 02/26/2016 0353  ALBUMIN 1.5* 1.8* 1.8*    Cardiac Enzymes  Recent Labs Lab 03/04/2016 2000 02/17/16 0005  02/17/16 0340  TROPONINI 0.22* 0.21* 0.22*    Glucose  Recent Labs Lab 02/19/16 1523 02/19/16 1635 02/19/16 2002 02/19/16 2356 02/08/2016 0310 02/09/2016 0737  GLUCAP 66 78 114* 94 94 78    Imaging Portable Chest Xray  02/22/2016  CLINICAL DATA:  Acute respiratory failure. EXAM: PORTABLE CHEST 1 VIEW COMPARISON:  02/19/2016 FINDINGS: Endotracheal tube unchanged. Nasogastric tube courses into the region of the stomach and off the inferior portion of the film as tip is not visualized. Right IJ catheter unchanged with tip just below the cavoatrial junction. Lungs are adequately inflated and demonstrate stable bibasilar opacification compatible with small effusions with associated atelectasis. Cannot exclude infection in the lung bases. Cardiomediastinal silhouette and remainder of the exam is unchanged. IMPRESSION: Stable bibasilar opacification compatible with small effusions with atelectasis. Cannot exclude infection in the lung bases. Tubes and lines unchanged. Electronically Signed   By: Elberta Fortis M.D.   On: 03/04/2016 07:14     STUDIES:  7/13 Echo >> EF 40 to 45%, mod LVH, small pericardial effusion 7/17 CXR >> stable bilateral pleural effusions, stable patchy opacities in the lung bases- pneumonia vs atelectasis   CULTURES: BCX2 7/10: Veillonella species (Gram negative coccobacilli) resp culture 7/10: Moderate SA and E.coli (both pan sensitive)  ANTIBIOTICS: cipro 7/12>>>7/14 vanc 7/2>>>7/17 Cefepime 7/14>>> Fluconazole 7/7>>>7/17 Flagyl 7/17 >>  SIGNIFICANT EVENTS: 7/14 Transfer from [facility] to [facility]  LINES/TUBES: OETT 7/10>>>7/17; 7/17 >>> Right IJ PICC (? Insertion date)>>> Trach 7/17  DISCUSSION: 40 yo male was at [facility] with PNA, VDRF and failure to wean.  Transferred to [facility] for rehab and weaning.  Developed bacteremia with HCAP in [facility] and recurrent VDRF.  Developed NSTEMI and new murmur.  Transferred to [facility] for further management.   ASSESSMENT /  PLAN:  PULMONARY A: Acute hypoxic respiratory failure  ?HCAP Bilateral pleural effusions w/ recent right effusion s/p thoracentesis P:   Full vent support.  Trach per ENT today Decrease Solumedrol from 40mg  IV q8hrs to q12hrs  CARDIOVASCULAR A:  Sepsis  Recent NSTEMI vs demand ischemia. Acute systolic CHF Small pericardial effusion with friction rub- infectious vs autoimmune pericarditis? P:  Cardiology consulted, appreciate assistance. May need to consider cardiac cath when medical issues improve. Can perform TEE now that he is re-intubated. Repeat Echo ANA pending Metoprolol IV 5mg  prn  RENAL A:   ESRD HD dependent  P:   Nephrology consulted, appreciate assistance. HD MWF. Replace electrolytes as needed  GASTROINTESTINAL A:   Recent GIB on LMWH P:   Trend cbc closely  PPI via NGT Tube feeds  HEMATOLOGIC A:   Anemia of chronic disease P:  Trend CBC SCDs Aranesp per Nephro  INFECTIOUS A:   Bacteremia with Veillonella species ?HCAP P:   ID consulted, appreciate assistance. Continue Flagyl x 2 weeks. Continue Cefepime x 8 days. Discontinued Vanc.  ENDOCRINE A:  DM type II w/ hyperglycemia  Secondary hyperparathyroidism  Steroid dependence per previous notes- unclear why? P:   Sensitive SSI  NEUROLOGIC A:   Acute Encephalopathy in setting of critical illness.  P:   RASS goal: -1 PAD protocol    Willadean Carol, MD Family Medicine, PGY-2

## 2016-02-20 NOTE — Progress Notes (Signed)
Patient ID: Tanner Campbell, male   DOB: 1975-09-29, 40 y.o.   MRN: [id number] S: Patient is intubated and not able to communicate.    O:BP 138/70 mmHg  Pulse 121  Temp(Src) 99 F (37.2 C) (Oral)  Resp 14  Wt 125 lb 7.1 oz (56.9 kg)  SpO2 100%  Intake/Output Summary (Last 24 hours) at 02/22/2016 1134 Last data filed at 02/03/2016 1100  Gross per 24 hour  Intake   1050 ml  Output   2000 ml  Net   -950 ml   Intake/Output: I/O last 3 completed shifts: In: 3009 [I.V.:530; NG/GT:335; IV Piggyback:550] Out: 2700 [Emesis/NG output:700; Other:2000]  Intake/Output this shift:  Total I/O In: 190 [I.V.:40; IV Piggyback:150] Out: -  Weight change: -7.1 oz (-0.2 kg) General appearance: Intubated; opening eyes and following commands.  Resp: occassional rhonchi Cardio: systolic murmur: holosystolic 3/6, blowing throughout the precordium and friction rub heard throughout precordium GI: soft, non-tender; bowel sounds normal; no masses, no organomegaly Extremities: LUE AVF +T/B, no edema Dialysis Access: LUE AVF   Recent Labs Lab 02/14/16 0544 02/15/16 0637 02/08/2016 0510 02/17/16 0340 02/17/16 1030 02/17/16 1723 02/18/16 0430 02/18/16 1700 02/19/16 0405 02/07/2016 0353  NA 133* 131* 129* 139  --   --  138  --  139 140  K 4.5 3.7 3.9 3.9  --   --  3.8  --  4.9 3.9  CL 95* 94* 90* 98*  --   --  100*  --  98* 99*  CO2 _0 --   --  28  --  29 27  GLUCOSE 196* 248* 274* 180*  --   --  128*  --  98 90  BUN 104* 56* 87* 55*  --   --  78*  --  99* 45*  CREATININE 4.34* 2.87* 3.57* 2.67*  --   --  3.65*  --  4.43* 2.70*  ALBUMIN 1.8*  --  1.5*  --   --   --  1.5*  --  1.8* 1.8*  CALCIUM 8.3* 7.9* 7.9* 8.1*  --   --  7.7*  --  7.8* 8.2*  PHOS 5.4*  --  3.1 3.1 3.6 4.2 4.4 5.3* 6.2* 5.0*   Liver Function Tests:  Recent Labs Lab 02/18/16 0430 02/19/16 0405 02/06/2016 0353  ALBUMIN 1.5* 1.8* 1.8*   CBC:  Recent Labs Lab 02/18/2016 0510 02/17/16 0340 02/18/16 0430  02/19/16 0405 02/19/2016 0352  WBC 21.8* 19.5* 17.7* 18.2* 23.1*  HGB 8.4* 8.6* 8.2* 8.2* 8.5*  HCT 26.5* 28.7* 26.9* 27.1* 28.6*  MCV 85.5 87.8 88.5 88.6 89.1  PLT 226 200 174 167 170   Cardiac Enzymes:  Recent Labs Lab 02/21/2016 2000 02/17/16 0005 02/17/16 0340  TROPONINI 0.22* 0.21* 0.22*   CBG:  Recent Labs Lab 02/19/16 1635 02/19/16 2002 02/19/16 2356 02/25/2016 0310 02/29/2016 0737  GLUCAP 78 114* 94 94 78    Iron Studies:   Recent Labs  02/24/2016 0352  IRON 15*  TIBC 151*  FERRITIN 2416*   Studies/Results: Portable Chest Xray  02/19/2016  CLINICAL DATA:  Acute respiratory failure. EXAM: PORTABLE CHEST 1 VIEW COMPARISON:  02/19/2016 FINDINGS: Endotracheal tube unchanged. Nasogastric tube courses into the region of the stomach and off the inferior portion of the film as tip is not visualized. Right IJ catheter unchanged with tip just below the cavoatrial junction. Lungs are adequately inflated and demonstrate stable bibasilar opacification compatible with small effusions with associated atelectasis. Cannot exclude infection  in the lung bases. Cardiomediastinal silhouette and remainder of the exam is unchanged. IMPRESSION: Stable bibasilar opacification compatible with small effusions with atelectasis. Cannot exclude infection in the lung bases. Tubes and lines unchanged. Electronically Signed   By: Marin Olp M.D.   On: 02/04/2016 07:14   Portable Chest Xray  02/19/2016  CLINICAL DATA:  Acute respiratory failure EXAM: PORTABLE CHEST 1 VIEW COMPARISON:  Chest x-ray from earlier same day and chest x-ray dated 02/18/2016. FINDINGS: Endotracheal tube remains well positioned with tip approximately 3 cm above the carina. Right IJ central line is stable in position with tip at the level of the cavoatrial junction. Mild cardiomegaly is stable. Overall cardiomediastinal silhouette is stable in size and configuration. Bilateral pleural effusions appear stable, both small to moderate  in size. Additional patchy opacities at each lung base could represent pneumonia or atelectasis. Mild interstitial edema persist bilaterally. IMPRESSION: 1. Stable bilateral pleural effusions. Also stable patchy opacities at each lung base which could represent pneumonia or atelectasis. 2. Stable mild interstitial edema. 3. No new lung findings. 4. Support apparatus is stable in position. Electronically Signed   By: Franki Cabot M.D.   On: 02/19/2016 12:05   Dg Chest Port 1 View  02/19/2016  CLINICAL DATA: Respiratory failure. EXAM: PORTABLE CHEST 1 VIEW COMPARISON:  02/18/2016. FINDINGS: Right IJ line and NG tube and stable position. Cardiomegaly with pulmonary vascular prominence bilateral interstitial prominence bilateral pleural effusions consistent congestive heart failure. Findings have progressed from prior exam. No pneumothorax. Stomach is slightly distended. IMPRESSION: 1. Lines and tubes in stable position. NG tube noted with tip below left hemidiaphragm. Mild gastric distention however is noted. 2. Cardiomegaly with pulmonary vascular prominence, bilateral interstitial prominence and bilateral pleural effusions consistent congestive heart failure. Electronically Signed   By: Marcello Moores  Register   On: 02/19/2016 07:13   . antiseptic oral rinse  7 mL Mouth Rinse QID  . atorvastatin  80 mg Per Tube q1800  . ceFEPime (MAXIPIME) IV  2 g Intravenous Q M,W,F-1800  . chlorhexidine gluconate (SAGE KIT)  15 mL Mouth Rinse BID  . [START ON 02/21/2016] darbepoetin (ARANESP) injection - DIALYSIS  60 mcg Intravenous Q Wed-HD  . [START ON 02/21/2016] famotidine (PEPCID) IV  20 mg Intravenous Q24H  . feeding supplement (VITAL AF 1.2 CAL)  1,000 mL Per Tube Q24H  . insulin aspart  0-9 Units Subcutaneous Q4H  . methylPREDNISolone (SOLU-MEDROL) injection  40 mg Intravenous Q12H  . metronidazole  500 mg Intravenous Q8H  . multivitamin  1 tablet Oral QHS    BMET    Component Value Date/Time   NA 140  02/03/2016 0353   K 3.9 02/29/2016 0353   CL 99* 02/27/2016 0353   CO2 27 02/15/2016 0353   GLUCOSE 90 02/26/2016 0353   BUN 45* 02/07/2016 0353   CREATININE 2.70* 02/10/2016 0353   CALCIUM 8.2* 02/19/2016 0353   CALCIUM 8.5* 02/03/2016 0600   GFRNONAA 28* 02/27/2016 0353   GFRAA 32* 02/04/2016 0353   CBC    Component Value Date/Time   WBC 23.1* 02/17/2016 0352   RBC 3.21* 03/03/2016 0352   HGB 8.5* 03/04/2016 0352   HCT 28.6* 02/12/2016 0352   PLT 170 02/28/2016 0352   MCV 89.1 02/08/2016 0352   MCH 26.5 02/13/2016 0352   MCHC 29.7* 02/03/2016 0352   RDW 16.8* 02/05/2016 0352   LYMPHSABS 0.6* 02/11/2016 0600   MONOABS 0.4 02/11/2016 0600   EOSABS 0.0 02/11/2016 0600   BASOSABS 0.0 02/11/2016  0600    Dialysis Orders: Center: [facility] on MWF .   Assessment/Plan: 1. VDRF - transferred from [facility] due to prolonged respiratory failure. PCCM managing- intubated. Planning for trach 2. Heart murmer and friction rub - ECHO performed 02/15/2016 with worsened LV function and wall motion abnormality suggesting ischemia or infarct. Unclear why murmer more pronounced and with friction rub. TEE to r/o endocarditis pending improvement of respiratory status. Cardiology following due to wall motion abnormality and for cardiac cath when stable 3. ESRD - Continue with HD MWF via AVF- due on Wednesday 4. Hypertension/volume - stable- really no volume on board 5. Anemia - on Aranesp 6mg. Iron studies showing low iron and low saturation ratio. However, will hold off repleting iron at this time in the setting of bacteremia. 6. Metabolic bone disease - Will follow ca/phos- stable at present, if calc low-  resume binder when eating  7. Nutrition - per primary svc 8. ID- vionella bacteremia- continue flagyl as per infectious disease recommendation 9. Respiratory failure 2/2 possible PNA - continue Cefepime  10. [employer]- has palliative care ever been involved in this case ? Missed HD  and self extubation maybe he is trying to tell [place].  Now with a trach it makes placement in an OP dialysis facility exceedingly more difficult  VShela Leff PGY 2 - IMTS  Patient seen and examined, agree with above note with above modifications. Stable now is going to be getting a trach- continued antibiotics for bacteremia- plan for HD in AM- of note, now with trach- makes placement in OP dialysis facility much more difficult  KCorliss Parish MD 02/21/2016

## 2016-02-20 NOTE — Care Management Note (Signed)
Case Management Note Donn Pierini RN, BSN Unit 2W-Case Manager [phone]  Patient Details  Name: Tanner Campbell MRN: [id number] Date of Birth: September 02, 1975  Subjective/Objective:      Pt admitted with acute resp.  -Intubated          Action/Plan: PTA pt was from [facility]- per conversation with Clydie Braun at [facility] [employer]- pt would be able to return to [facility] if pt and family agreeable to this when pt stable- Plan for Trach at some point - CM to continue to follow for [facility] needs.  Expected Discharge Date:                  Expected Discharge Plan:  Long Term Acute Care (LTAC)  In-House Referral:  Clinical Social Work  Discharge planning Services  CM Consult  Post Acute Care Choice:    Choice offered to:     DME Arranged:    DME Agency:     HH Arranged:    HH Agency:     Status of Service:  In process, will continue to follow  If discussed at Long Length of Stay Meetings, dates discussed:    Additional Comments:  Darrold Span, RN 02/03/2016, 1:57 PM

## 2016-02-20 NOTE — Op Note (Signed)
DATE OF PROCEDURE:  02/18/2016                              OPERATIVE REPORT  SURGEON:  Newman Pies, MD  PREOPERATIVE DIAGNOSES: 1. Respiratory failure. 2. Ventilator dependence 3. Pneumonia  POSTOPERATIVE DIAGNOSES: 1. Respiratory failure. 2. Ventilator dependence 3. Pneumonia  PROCEDURE PERFORMED:  Tracheostomy  ANESTHESIA:  General endotracheal tube anesthesia.  COMPLICATIONS:  None.  ESTIMATED BLOOD LOSS:  Minimal.  INDICATION FOR PROCEDURE:  Tanner Campbell is a 40 y.o. male with a history of respiratory failure, pneumonia, and ventilator dependence. He failed multiple extubation attempts. The decision was therefore made to proceed with tracheostomy tube placement. Informed consent was obtained.  DESCRIPTION:  The patient was taken to the operating room and placed supine on the operating table. General anesthesia was administered via the pre-existing endotracheal tube. The patient was positioned and prepped and draped in the standard fashion for tracheostomy tube placement. 1% lidocaine with 1-100,000 epinephrine was injected at the anterior neck. A 2 cm vertical incision was made in the anterior necks, at the level slightly below the cricoid bone. The incision was carried down past the level of the platysma muscle. The strep muscles were identified and divided in midline. They were retracted laterally, exposing the thyroid gland. The thyroid was divided at midline and retracted laterally. The anterior tracheal wall was exposed. A tracheal window was made at the level of the second tracheal ring. The endotracheal tube was withdrawn. A # 6 cuffed Shiley tracheostomy tube was placed without difficulty. Good end tidal volume and CO2 return was noted. The tracheostomy tube was secured in place with 2-0 Prolene sutures and circumferential necktie. The care of the patient was transferred to the anesthesiologist. The patient was awakened from anesthesia without difficulty. He was transferred back  to the intensive care unit in stable condition.  OPERATIVE FINDINGS:  A # 6 cuffed Shiley tracheostomy tube was placed without difficulty.  SPECIMEN:  None.  FOLLOWUP CARE:  The patient will return to the ICU.   Darletta Moll 02/05/2016 6:31 PM

## 2016-02-20 NOTE — Progress Notes (Addendum)
  TEE has been scheduled at the bedside on Thursday, 02/22/2016 at 3:00PM to be performed by Dr. Mayford Knife. Echo made aware along with Endo (so two procedures would not be scheduled at the same time).   Will need orders entered on 7/19 and made NPO after midnight leading up to the procedure.  Signed, Ellsworth Lennox, PA-C 02/29/2016, 3:24 PM

## 2016-02-20 NOTE — Evaluation (Signed)
Physical Therapy Evaluation Patient Details Name: Tanner Campbell MRN: [id number] DOB: 11/11/1975 Today's Date: 02/19/2016   History of Present Illness  Tanner Campbell is an 40 y.o. 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 He was intubated 6/23-7/5 (self extubated), 7/10-7/16 (self extubated), now reintubated. Initially, sputum was positive for H influenza and subsequently for MSSA and Escherichia coli and blood cultures growing a gram-negative anaerobic organism. Thoracentesis on right showed borderline exudate by LDH criteria. Plan for trach today 7/18 at 5:30 pm.    Clinical Impression  Pt admitted with above diagnosis. Pt currently with functional limitations due to the deficits listed below (see PT Problem List). Bed level eval as pt somewhat lethargic as well as nurse states pt going for trach later this am.  Performed eval of UE and LE movement and then nurse came in and assisted nurse with positioning pt.  Nurse then was notified that trach was cancelled until later this pm.  However, pt was agitated from the positioning therefore bed level eval is all PT performed today.  Will follow acutely as able.  Pt will benefit from skilled PT to increase their independence and safety with mobility to allow discharge to the venue listed below.    Follow Up Recommendations CIR;Supervision/Assistance - 24 hour    Equipment Recommendations  Other (comment) (TBA)    Recommendations for Other Services Rehab consult     Precautions / Restrictions Precautions Precautions: Fall Restrictions Weight Bearing Restrictions: No      Mobility  Bed Mobility Overal bed mobility: Needs Assistance;+2 for physical assistance Bed Mobility: Rolling Rolling: Mod assist         General bed mobility comments: Needed mod assist to roll to left to place  pillow under right side.  Assisted nurse with positioning up in bed with total assist to scoot up. Placed in chair position.   Transfers                    Ambulation/Gait                Stairs            Wheelchair Mobility    Modified Rankin (Stroke Patients Only)       Balance                                             Pertinent Vitals/Pain Pain Assessment: Faces Faces Pain Scale: Hurts little more Pain Location: generalized with movement Pain Descriptors / Indicators: Grimacing Pain Intervention(s): Limited activity within patient's tolerance;Monitored during session;Repositioned  92-108 bpm, 99% O2 on PEEP 5, 40% FiO2 on vent.    Home Living Family/patient expects to be discharged to:: Private residence Living Arrangements: Other relatives Available Help at Discharge:  (per prior chart, sister was caregiver PTA)           Home Equipment: Other (comment) (chronic home O2) Additional Comments: HD Tues, Th, Sat.  Pt cannot answer questions and no family present to ask about home set up.     Prior Function Level of Independence: Independent         Comments: Pt shook head that he ambulated PTA.  Hand Dominance        Extremity/Trunk Assessment   Upper Extremity Assessment: Defer to OT evaluation (pt moves left shoulder to command consistently)           Lower Extremity Assessment: RLE deficits/detail;LLE deficits/detail RLE Deficits / Details: grossly 2+/5 with inconsistently following commands LLE Deficits / Details: grossly 3/5 with pt consistently following commands moving this LE     Communication   Communication: Other (comment) (Intubated)  Cognition Arousal/Alertness: Lethargic;Suspect due to medications Behavior During Therapy: Flat affect;Restless Overall Cognitive Status: Difficult to assess                      General Comments General comments (skin integrity, edema, etc.):  Performed bed level due to pt got agitated with only rolling and positioning and nursing had to suction.     Exercises General Exercises - Upper Extremity Shoulder Flexion: AROM;Both;5 reps;Supine Elbow Flexion: AROM;Both;5 reps;Supine Elbow Extension: AROM;Both;5 reps;Supine Wrist Flexion: AROM;Both;5 reps;Supine Wrist Extension: AROM;Both;5 reps;Supine General Exercises - Lower Extremity Ankle Circles/Pumps: AROM;Left;5 reps;Supine Heel Slides: AROM;AAROM;Both;5 reps;Supine Hip ABduction/ADduction: AAROM;Both;5 reps;Supine      Assessment/Plan    PT Assessment Patient needs continued PT services  PT Diagnosis Generalized weakness   PT Problem List Decreased balance;Decreased activity tolerance;Decreased mobility;Decreased knowledge of use of DME;Decreased safety awareness;Decreased knowledge of precautions  PT Treatment Interventions DME instruction;Gait training;Functional mobility training;Therapeutic activities;Balance training;Therapeutic exercise;Patient/family education   PT Goals (Current goals can be found in the Care Plan section) Acute Rehab PT Goals Patient Stated Goal: unable to state PT Goal Formulation: Patient unable to participate in goal setting Time For Goal Achievement: 03/05/16 Potential to Achieve Goals: Good    Frequency Min 3X/week   Barriers to discharge Other (comment) (unsure of caregiver support)      Co-evaluation               End of Session Equipment Utilized During Treatment: Gait belt;Oxygen (intubated) Activity Tolerance: Patient limited by fatigue;Patient limited by lethargy Patient left: in bed;with call bell/phone within reach;with bed alarm set;with restraints reapplied Nurse Communication: Mobility status;Need for lift equipment         Time: 9935-7017 PT Time Calculation (min) (ACUTE ONLY): 16 min   Charges:   PT Evaluation $PT Eval Moderate Complexity: 1 Procedure     PT G CodesBerline Lopes 02-28-2016,  11:33 AM [facility] Acute Rehabilitation [phone] (pager)

## 2016-02-20 NOTE — Progress Notes (Signed)
 Patient Name: Tanner Campbell Date of Encounter: 02/19/2016   Primary Cardiologist: new (Dr. Hilty) Pt. Profile: Mr. Printz is 40 yo male with PMH of HTN, HLD, DM, Multiple sclerosis, COPD and ESRD on HD since 05/02/2014 presented with acute resp distress, failed multiple extubation attempt. Acute respiratory failure felt to be due to a combination of Haemophilus pneumonia and volume overload. Patient has missed at least 3 episodes of hemodialysis before presentation, nephrology managing hemodialysis.Found to have mildly elevated trop on 7/7-7/8.   Cardiology consulted 7/10 for elevated troponin and EKG changes. An echocardiogram 02/07/16 a mildly reduced LVEF of 45-50% however there is akinesis of the mid inferior and inferoseptal myocardium, mild aortic insufficiency and a small pericardial effusion. A repeat EKG 7/13 showed worsening deep T-wave inversions in the antero-lateral leads with high voltage as well as inferior T-wave inversions. Advice to avoid ASA in setting of acute GI bleed.   Echo done 02/15/16 showed slightly worse LV function with focal inferior wall motion abnormality, which could suggest severe ischemia or infarct in this territory. There is a small pericardial effusion which is stable. Resumed ASA.   In term, his sputum cultures are growing MSSA and e coli and also has positive blood cultures with gram negative coccobacilli. The patient was noted to have a new friction rub and ? Murmur this weekend.   Peak of troponin was 0.50 02/12/16. Now trending down.   Patient self extubated himself yesterday but reintubated  SUBJECTIVE Patient is sedated on vent.   CURRENT MEDS . antiseptic oral rinse  7 mL Mouth Rinse QID  . atorvastatin  80 mg Per Tube q1800  . ceFEPime (MAXIPIME) IV  2 g Intravenous Q M,W,F-1800  . chlorhexidine gluconate (SAGE KIT)  15 mL Mouth Rinse BID  . [START ON 02/21/2016] darbepoetin (ARANESP) injection - DIALYSIS  100 mcg Intravenous Q Wed-HD  .  [START ON 02/21/2016] famotidine (PEPCID) IV  20 mg Intravenous Q24H  . feeding supplement (VITAL AF 1.2 CAL)  1,000 mL Per Tube Q24H  . insulin aspart  0-9 Units Subcutaneous Q4H  . methylPREDNISolone (SOLU-MEDROL) injection  40 mg Intravenous Q12H  . metronidazole  500 mg Intravenous Q8H  . multivitamin  1 tablet Oral QHS    OBJECTIVE  Filed Vitals:   03/04/2016 1100 02/05/2016 1120 02/18/2016 1200 02/25/2016 1300  BP: 138/70  125/29 134/46  Pulse: 121  90 88  Temp:  99 F (37.2 C)    TempSrc:  Oral    Resp:      Weight:      SpO2: 100%  99% 97%    Intake/Output Summary (Last 24 hours) at 02/07/2016 1310 Last data filed at 02/15/2016 1100  Gross per 24 hour  Intake   1030 ml  Output   2000 ml  Net   -970 ml   Filed Weights   02/19/16 1255 02/19/16 1640 02/08/2016 0100  Weight: 131 lb 9.8 oz (59.7 kg) 127 lb 3.3 oz (57.7 kg) 125 lb 7.1 oz (56.9 kg)    PHYSICAL EXAM  General: Pleasant, thin WM in NAD. On vent Neuro:  sedated HEENT:  Normal  Neck: Supple without bruits or JVD. Lungs:  Resp regular on vent with coarse BS Heart: RRR loud pericardial friction rub. I cannot hear a definite murmur. Abdomen: Soft, non-tender, non-distended, BS + x 4.  Extremities: No clubbing, cyanosis or edema. DP/PT/Radials 2+ and equal bilaterally.  Accessory Clinical Findings  CBC  Recent Labs  02/19/16 0405 03/02/2016 0352    WBC 18.2* 23.1*  HGB 8.2* 8.5*  HCT 27.1* 28.6*  MCV 88.6 89.1  PLT 167 119   Basic Metabolic Panel  Recent Labs  02/18/16 0430 02/18/16 1700 02/19/16 0405 02/27/2016 0353  NA 138  --  139 140  K 3.8  --  4.9 3.9  CL 100*  --  98* 99*  CO2 28  --  29 27  GLUCOSE 128*  --  98 90  BUN 78*  --  99* 45*  CREATININE 3.65*  --  4.43* 2.70*  CALCIUM 7.7*  --  7.8* 8.2*  MG 2.1 2.1  --   --   PHOS 4.4 5.3* 6.2* 5.0*   Liver Function Tests  Recent Labs  02/19/16 0405 02/04/2016 0353  ALBUMIN 1.8* 1.8*   No results for input(s): LIPASE, AMYLASE in the last 72  hours. Cardiac Enzymes No results for input(s): CKTOTAL, CKMB, CKMBINDEX, TROPONINI in the last 72 hours.  Radiology/Studies  Ct Chest Wo Contrast  02/21/2016  CLINICAL DATA:  Pneumonia. History of diabetes and hypertension. Hemodialysis patient. EXAM: CT CHEST WITHOUT CONTRAST TECHNIQUE: Multidetector CT imaging of the chest was performed following the standard protocol without IV contrast. COMPARISON:  None. FINDINGS: Cardiovascular: Cardiac enlargement. Coronary artery calcifications. Small pericardial effusion. Normal caliber thoracic aorta. Central venous catheter with tip at the cavoatrial junction. Mediastinum/Nodes: Mildly prominent lymph nodes throughout the mediastinum. Largest pretracheal nodes measuring about 15 mm diameter. The etiology is nonspecific but probably reactive. Esophagus is decompressed with an enteric tube in place. Lungs/Pleura: Patchy nodular airspace infiltrates demonstrated throughout both lungs. This could represent multifocal pneumonia, edema, or ARDS. Linear atelectasis in both lung bases. Endotracheal tube is in place. Small amount of secretions in the dependent trachea. Small bilateral pleural effusions slightly greater on the right. Upper Abdomen: Surgical absence of the gallbladder. Calcified granulomas in spleen. Vascular calcifications. Enteric tube tip is in stomach. Musculoskeletal: Mild degenerative changes in the spine. No destructive bone lesions. IMPRESSION: Cardiac enlargement with small pericardial effusion. Small bilateral pleural effusions. Patchy nodular airspace disease throughout both lungs may indicate edema or pneumonia. Findings suggest congestive failure. Electronically Signed   By: Lucienne Capers M.D.   On: 02/07/2016 21:41   Dg Chest Port 1 View  02/18/2016  CLINICAL DATA:  Respiratory failure EXAM: PORTABLE CHEST 1 VIEW COMPARISON:  CT chest 02/11/2016.  One-view chest x-ray 02/03/2016. FINDINGS: Endotracheal tube is stable. The NG tube courses  off the inferior border the film. A right IJ line is stable. Bilateral pleural effusions and basilar airspace disease is unchanged. Mild edema is stable. IMPRESSION: 1. Stable bilateral pleural effusions and airspace disease. 2. Support apparatus is stable. Electronically Signed   ByHarrell Gave    TTE: 02/15/2016 - Left ventricle: The cavity size was normal. There was moderate  concentric hypertrophy. Systolic function was mildly to  moderately reduced. The estimated ejection fraction was in the  range of 40% to 45%. Diffuse hypokinesis. There is akinesis of  the inferoseptal myocardium. - Aortic valve: There was trivial regurgitation. - Mitral valve: Moderately calcified annulus. - Pericardium, extracardiac: A small pericardial effusion was  identified along the left ventricular free wall. There was no  evidence of hemodynamic compromise.  Impressions:  - Compared to the prior study on 02/07/16, there has been no significant  interval change.   ASSESSMENT AND PLAN Principal Problem:   Acute respiratory failure with hypoxia (HCC) Active Problems:   Sepsis ([place])   ESRD (end stage renal disease) ([place])  Protein-calorie malnutrition, severe   Acute on chronic systolic CHF (congestive heart failure), NYHA class 3 (HCC)   NSTEMI (non-ST elevated myocardial infarction) (HCC)   Anaerobic bacteremia    1. Gram negative sepsis/bacteremia. On antibiotics per CCM. Afebrile.   2. Acute respiratory failure. reintubated yesterday today. Oxygenating well. Combined volume overload and PNA. Being considered for tracheostomy.  3. Elevated troponin. Peak 0.5. Probably related to demand ischemia. 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 Ecg shows inferolateral ischemia that has not changed considerably. May need to consider ischemic evaluation with cardiac cath once concomitant medical issues improved.   4. Pericardial friction rub.   Both Echo on 7/13 and CT on 7/14 show a small pericardial effusion. No evidence of tamponade. No clear symptoms.  I am not convinced there is a new murmur- sounds more like a friction rub to me. Repeat  Echo ordered. Given bacteremia it would be reasonable to to a TEE to evaluate for possible endocarditis. Will see if we can arrange tomorrow at bedside while on vent.   5. ESRD on dialysis. Management of volume status per Renal.   6. Recent GI bleed.   7. Acute encephalitis in setting of critical illness.   8. Anemia  Arie Sabina [facility] 02/22/2016

## 2016-02-20 NOTE — Progress Notes (Signed)
Patient to OR with Anesthesia Nurse on monitor

## 2016-02-20 NOTE — Anesthesia Postprocedure Evaluation (Signed)
Anesthesia Post Note  Patient: Tanner Campbell  Procedure(s) Performed: Procedure(s) (LRB): TRACHEOSTOMY (N/A)  Patient location during evaluation: SICU Anesthesia Type: General Level of consciousness: sedated and patient remains intubated per anesthesia plan Vital Signs Assessment: post-procedure vital signs reviewed and stable Respiratory status: respiratory function unstable and patient on ventilator - see flowsheet for VS Cardiovascular status: blood pressure returned to baseline Anesthetic complications: no    Last Vitals:  Filed Vitals:   02/27/2016 1600 02/14/2016 1700  BP: 131/41 147/51  Pulse: 92 84  Temp:    Resp:  19    Last Pain:  Filed Vitals:   02/25/2016 1733  PainSc: Asleep                 Jonta Gastineau COKER

## 2016-02-20 NOTE — Transfer of Care (Signed)
Immediate Anesthesia Transfer of Care Note  Patient: Tanner Campbell  Procedure(s) Performed: Procedure(s): TRACHEOSTOMY (N/A)  Patient Location: ICU  Anesthesia Type:General  Level of Consciousness: Patient remains intubated per anesthesia plan  Airway & Oxygen Therapy: Patient remains intubated per anesthesia plan and Patient placed on Ventilator (see vital sign flow sheet for setting)  Post-op Assessment: Report given to RN and Post -op Vital signs reviewed and stable  Post vital signs: Reviewed and stable  Last Vitals:  Filed Vitals:   02/21/2016 1600 02/14/2016 1700  BP: 131/41 147/51  Pulse: 92 84  Temp:    Resp:  19    Last Pain:  Filed Vitals:   02/05/2016 1733  PainSc: Asleep         Complications: No apparent anesthesia complications

## 2016-02-20 NOTE — Progress Notes (Signed)
Rehab Admissions Coordinator Note:  Patient was screened Tanner Campbell for appropriateness for an Inpatient Acute Rehab Consult.  At this time, we are recommending LTACH. Patient was at [facility] and likely can return to [facility] for recovery and therapies.  Tanner Campbell 02/21/2016, 2:12 PM  I can be reached at [phone].

## 2016-02-21 ENCOUNTER — Encounter (HOSPITAL_COMMUNITY): Payer: Self-pay | Admitting: Otolaryngology

## 2016-02-21 LAB — RENAL FUNCTION PANEL
ALBUMIN: 1.8 g/dL — AB (ref 3.5–5.0)
Anion gap: 19 — ABNORMAL HIGH (ref 5–15)
BUN: 79 mg/dL — AB (ref 6–20)
CHLORIDE: 98 mmol/L — AB (ref 101–111)
CO2: 21 mmol/L — ABNORMAL LOW (ref 22–32)
CREATININE: 4.05 mg/dL — AB (ref 0.61–1.24)
Calcium: 8.2 mg/dL — ABNORMAL LOW (ref 8.9–10.3)
GFR, EST AFRICAN AMERICAN: 20 mL/min — AB (ref >60)
GFR, EST NON AFRICAN AMERICAN: 17 mL/min — AB (ref >60)
Glucose, Bld: 169 mg/dL — ABNORMAL HIGH (ref 65–99)
PHOSPHORUS: 8.9 mg/dL — AB (ref 2.5–4.6)
POTASSIUM: 4.5 mmol/L (ref 3.5–5.1)
Sodium: 138 mmol/L (ref 135–145)

## 2016-02-21 LAB — CULTURE, BLOOD (ROUTINE X 2)

## 2016-02-21 LAB — GLUCOSE, CAPILLARY
GLUCOSE-CAPILLARY: 151 mg/dL — AB (ref 65–99)
GLUCOSE-CAPILLARY: 164 mg/dL — AB (ref 65–99)
GLUCOSE-CAPILLARY: 174 mg/dL — AB (ref 65–99)
Glucose-Capillary: 92 mg/dL (ref 65–99)
Glucose-Capillary: 112 mg/dL — ABNORMAL HIGH (ref 65–99)
Glucose-Capillary: 154 mg/dL — ABNORMAL HIGH (ref 65–99)
Glucose-Capillary: 153 mg/dL — ABNORMAL HIGH (ref 65–99)
Glucose-Capillary: 145 mg/dL — ABNORMAL HIGH (ref 65–99)

## 2016-02-21 LAB — CBC
HEMATOCRIT: 27.7 % — AB (ref 39.0–52.0)
Hemoglobin: 8.7 g/dL — ABNORMAL LOW (ref 13.0–17.0)
MCH: 27.4 pg (ref 26.0–34.0)
MCHC: 31.4 g/dL (ref 30.0–36.0)
MCV: 87.1 fL (ref 78.0–100.0)
PLATELETS: 168 10*3/uL (ref 150–400)
RBC: 3.18 MIL/uL — AB (ref 4.22–5.81)
RDW: 16.5 % — ABNORMAL HIGH (ref 11.5–15.5)
WBC: 31.9 10*3/uL — AB (ref 4.0–10.5)

## 2016-02-21 MED ORDER — DARBEPOETIN ALFA 100 MCG/0.5ML IJ SOSY
PREFILLED_SYRINGE | INTRAMUSCULAR | Status: AC
  Filled 2016-02-21: qty 0.5

## 2016-02-21 MED ORDER — METHYLPREDNISOLONE SODIUM SUCC 40 MG IJ SOLR
40.0000 mg | Freq: Every day | INTRAMUSCULAR | Status: DC
  Filled 2016-02-21: qty 1

## 2016-02-21 MED ORDER — SENNOSIDES 8.8 MG/5ML PO SYRP
10.0000 mL | ORAL_SOLUTION | Freq: Every day | ORAL | Status: DC
  Administered 2016-02-21: 10 mL via ORAL
  Filled 2016-02-21 (×3): qty 10

## 2016-02-21 NOTE — Progress Notes (Signed)
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 He was admitted by the hospitalist service for further evaluation. He was treated with BiPAP on admit. Unfortunately, he decompensated 6/23 and required intubation. The patient was found to have H. Influenza positive sputum and treated with rocephin. CXR demonstrated a large right pleural effusion and underwent a thoracentesis (1L serous fluid removed, likely exudative by protein). He had difficulty with weaning and was transferred to [facility] on 6/29 orally intubated for further evaluation / ventilator weaning. The patient developed fever and antibiotics were expanded to vancomycin, cefepime and diflucan. He had progressed to weaning on PSV x 4 hours as of 7/3. He self extubated on 7/5. Since that time has required BiPAP off and on. After HD 7/10 his mental status worsened and he became minimally responsive. CXR consistent with RLL opacification and he was re-intubated. He was re-cultured at this point, and Also was found to have increased Troponin for which cardiology was consulted. He was placed on LMWH and apparently had GIB while on this and his hgb dropped as low as 8.4 from 9.3. As of 7/14 he remains ventilator dependent. His sputum cultures are growing MSSA and e coli and also has positive blood cultures. Because of all of his overwhelming issues it was felt best to transfer him to a higher level of care.   SUBJECTIVE:  Pt did well overnight, no acute events. Received trach yesterday.  VITAL SIGNS: BP 93/56 mmHg  Pulse 95   Temp(Src) 98.2 F (36.8 C) (Oral)  Resp 21  Wt 56.6 kg (124 lb 12.5 oz)  SpO2 100%  VENTILATOR SETTINGS: Vent Mode:  [-] CPAP;PSV FiO2 (%):  [30 %-40 %] 30 % Set Rate:  [14 bmp] 14 bmp Vt Set:  [500 mL] 500 mL PEEP:  [5 cmH20] 5 cmH20 Pressure Support:  [8 cmH20] 8 cmH20 Plateau Pressure:  [27 cmH20-30 cmH20] 30 cmH20  INTAKE / OUTPUT: I/O last 3 completed shifts: In: 1257.7 [I.V.:550; NG/GT:207.7; IV Piggyback:500] Out: 0   PHYSICAL EXAMINATION: General: ill appearing, cachetic  Neuro: Sedated HEENT: Trach Cardiac: regular, friction rub auscultated Chest: normal work of breathing, pleural rubs auscultated, diminished breath sounds in the lung bases Abd: soft, non tender Ext: no edema, decreased muscle bulk Skin: multiple areas of healing excoriations on the lower extremities  LABS:  BMET  Recent Labs Lab 02/19/16 0405 03/03/2016 0353 02/21/16 0510  NA 139 140 138  K 4.9 3.9 4.5  CL 98* 99* 98*  CO2 29 27 21*  BUN 99* 45* 79*  CREATININE 4.43* 2.70* 4.05*  GLUCOSE 98 90 169*    Electrolytes  Recent Labs Lab 02/17/16 1723 02/18/16 0430 02/18/16 1700 02/19/16 0405 03/03/2016 0353 02/21/16 0510  CALCIUM  --  7.7*  --  7.8* 8.2* 8.2*  MG 2.1 2.1 2.1  --   --   --   PHOS 4.2 4.4 5.3* 6.2* 5.0* 8.9*    CBC  Recent Labs Lab 02/19/16 0405 02/08/2016 0352 02/21/16 0510  WBC 18.2* 23.1* 31.9*  HGB 8.2*  8.5* 8.7*  HCT 27.1* 28.6* 27.7*  PLT 167 170 168    Coag's  Recent Labs Lab 02/15/2016 2000 02/06/2016 0352  APTT 33  --   INR 1.15 1.13    Sepsis Markers  Recent Labs Lab 02/07/2016 2000 02/17/16 0340 02/18/16 0430  PROCALCITON 11.92 11.54 7.55    ABG  Recent Labs Lab 02/13/2016 1840 02/17/16 0434 02/19/16 1609  PHART 7.511* 7.489* 7.409  PCO2ART 38.5 37.8 46.8*  PO2ART 94.0 96.0 67.0*    Liver Enzymes  Recent Labs Lab 02/19/16 0405 02/17/2016 0353 02/21/16 0510  ALBUMIN 1.8* 1.8* 1.8*    Cardiac Enzymes  Recent Labs Lab  02/28/2016 2000 02/17/16 0005 02/17/16 0340  TROPONINI 0.22* 0.21* 0.22*    Glucose  Recent Labs Lab 02/13/2016 0310 02/26/2016 0737 02/19/2016 1938 02/21/16 0004 02/21/16 0411 02/21/16 0747  GLUCAP 94 78 105* 164* 154* 153*    Imaging No results found.   STUDIES:  7/13 Echo >> EF 40 to 45%, mod LVH, small pericardial effusion 7/17 CXR >> stable bilateral pleural effusions, stable patchy opacities in the lung bases- pneumonia vs atelectasis ANA neg   CULTURES: BCX2 7/10: Veillonella species (Gram negative coccobacilli) resp culture 7/10: Moderate SA and E.coli (both pan sensitive)  ANTIBIOTICS: cipro 7/12>>>7/14 vanc 7/2>>>7/17 Cefepime 7/14>>> Fluconazole 7/7>>>7/17 Flagyl 7/17 >>  SIGNIFICANT EVENTS: 7/14 Transfer from [facility] to [facility]  LINES/TUBES: OETT 7/10>>>7/17; 7/17 >>> Right IJ PICC (? Insertion date)>>> Trach 7/18   DISCUSSION: 40 yo male was at [facility] with PNA, VDRF and failure to wean.  Transferred to [facility] for rehab and weaning.  Developed bacteremia with HCAP in [facility] and recurrent VDRF.  Developed NSTEMI and new murmur.  Transferred to [facility] for further management.   ASSESSMENT / PLAN:  PULMONARY A: Acute hypoxic respiratory failure - s/p trach 7/18 ?HCAP Bilateral pleural effusions w/ recent right effusion s/p thoracentesis P:   Trach collar today Decrease Solumedrol from  IV q12hrs to  daily. Will likely d/c tomorrow. PT consult  CARDIOVASCULAR A:  Recent NSTEMI vs demand ischemia. Acute systolic CHF Small pericardial effusion with friction rub P:  Cardiology consulted, appreciate assistance. May need to consider cardiac cath when medical issues improve. Plan for bedside TEE 7/20 at 3pm. Metoprolol IV  prn  RENAL A:   ESRD HD dependent  P:   Nephrology consulted, appreciate assistance. HD MWF. Replace electrolytes as needed  GASTROINTESTINAL A:   Recent GIB on LMWH P:   Trend cbc  PPI via NGT Tube feeds  HEMATOLOGIC A:    Anemia of chronic disease P:  Trend CBC SCDs Aranesp per Nephro  INFECTIOUS A:   Bacteremia with Veillonella species ?HCAP P:   ID consulted, appreciate assistance. Continue Flagyl x 2 weeks. Continue Cefepime x 8 days. Discontinued Vanc.  ENDOCRINE A:   DM type II w/ hyperglycemia  Secondary hyperparathyroidism  Steroid dependence per previous notes- unclear why? P:   Sensitive SSI  NEUROLOGIC A:   Acute Encephalopathy in setting of critical illness.  P:   RASS goal: -1 PAD protocol    Willadean Carol, MD Family Medicine, PGY-2

## 2016-02-21 NOTE — Progress Notes (Signed)
Subjective: Pt is on vent. Sleeping. No trach issues.  Objective: Vital signs in last 24 hours: Temp:  [97.4 F (36.3 C)-99 F (37.2 C)] 98.2 F (36.8 C) (07/19 0748) Pulse Rate:  [84-121] 88 (07/19 0800) Resp:  [14-24] 18 (07/19 0800) BP: (76-170)/(24-152) 119/67 mmHg (07/19 0800) SpO2:  [94 %-100 %] 99 % (07/19 0800) FiO2 (%):  [30 %-40 %] 30 % (07/19 0800) Weight:  [124 lb 12.5 oz (56.6 kg)-126 lb 15.8 oz (57.6 kg)] 124 lb 12.5 oz (56.6 kg) (07/19 0700)  Physical Exam: General: Sleeping. NAD. On vent. Ears: Normal auricles and EACs. Nose: Normal mucosa. Oral: Normal mucosa. No lesion. Neck: Trach in place. No bleeding. Venting well. Abdomen: Soft, non tender. Skin: No rashes   Recent Labs  02/19/2016 0352 02/21/16 0510  WBC 23.1* 31.9*  HGB 8.5* 8.7*  HCT 28.6* 27.7*  PLT 170 168    Recent Labs  02/08/2016 0353 02/21/16 0510  NA 140 138  K 3.9 4.5  CL 99* 98*  CO2 27 21*  GLUCOSE 90 169*  BUN 45* 79*  CREATININE 2.70* 4.05*  CALCIUM 8.2* 8.2*    Medications:  I have reviewed the patient's current medications. Scheduled: . antiseptic oral rinse  7 mL Mouth Rinse QID  . atorvastatin  80 mg Per Tube q1800  . ceFEPime (MAXIPIME) IV  2 g Intravenous Q M,W,F-1800  . chlorhexidine gluconate (SAGE KIT)  15 mL Mouth Rinse BID  . Darbepoetin Alfa      . darbepoetin (ARANESP) injection - DIALYSIS  100 mcg Intravenous Q Wed-HD  . famotidine (PEPCID) IV  20 mg Intravenous Q24H  . feeding supplement (VITAL AF 1.2 CAL)  1,000 mL Per Tube Q24H  . insulin aspart  0-9 Units Subcutaneous Q4H  . methylPREDNISolone (SOLU-MEDROL) injection  40 mg Intravenous Q12H  . metronidazole  500 mg Intravenous Q8H  . multivitamin  1 tablet Oral QHS   EBR:AXENMM chloride, sodium chloride, sodium chloride, alteplase, fentaNYL (SUBLIMAZE) injection, fentaNYL (SUBLIMAZE) injection, fentaNYL (SUBLIMAZE) injection, heparin, lidocaine (PF), lidocaine-prilocaine, metoprolol, midazolam,  midazolam, pentafluoroprop-tetrafluoroeth  Assessment/Plan: POD #1 s/p trach. Venting well. Pt with pneumonia and respiratory failure. Fresh trach care. Will change trach early next week.   LOS: 5 days   Luca Dyar,SUI W 02/21/2016, 8:14 AM

## 2016-02-21 NOTE — Progress Notes (Signed)
Patient Name: Tanner Campbell Date of Encounter: 02/21/2016   Primary Cardiologist: new (Dr. Debara Pickett) Pt. Profile: Tanner Campbell is 40 yo male with PMH of HTN, HLD, DM, Multiple sclerosis, COPD and ESRD on HD since 05/02/2014 presented with acute resp distress, failed multiple extubation attempt. Acute respiratory failure felt to be due to a combination of Haemophilus pneumonia and volume overload. Patient has missed at least 3 episodes of hemodialysis before presentation, nephrology managing hemodialysis.Found to have mildly elevated trop on 7/7-7/8.   Cardiology consulted 7/10 for elevated troponin and EKG changes. An echocardiogram 02/07/16 a mildly reduced LVEF of 45-50% however there is akinesis of the mid inferior and inferoseptal myocardium, mild aortic insufficiency and a small pericardial effusion. A repeat EKG 7/13 showed worsening deep T-wave inversions in the antero-lateral leads with high voltage as well as inferior T-wave inversions. Advice to avoid ASA in setting of acute GI bleed.   Echo done 02/15/16 showed slightly worse LV function with focal inferior wall motion abnormality, which could suggest severe ischemia or infarct in this territory. There is a small pericardial effusion which is stable. Resumed ASA.   In term, his sputum cultures are growing MSSA and e coli and also has positive blood cultures with gram negative coccobacilli. The patient was noted to have a new friction rub and ? Murmur this weekend.   Peak of troponin was 0.50 02/12/16. Now trending down.   Patient now with trach in place  SUBJECTIVE Patient is arousable but not conversant. Trach collar in place.   CURRENT MEDS . antiseptic oral rinse  7 mL Mouth Rinse QID  . atorvastatin  80 mg Per Tube q1800  . ceFEPime (MAXIPIME) IV  2 g Intravenous Q M,W,F-1800  . chlorhexidine gluconate (SAGE KIT)  15 mL Mouth Rinse BID  . darbepoetin (ARANESP) injection - DIALYSIS  100 mcg Intravenous Q Wed-HD  . famotidine  (PEPCID) IV  20 mg Intravenous Q24H  . feeding supplement (VITAL AF 1.2 CAL)  1,000 mL Per Tube Q24H  . insulin aspart  0-9 Units Subcutaneous Q4H  . [START ON 02/22/2016] methylPREDNISolone (SOLU-MEDROL) injection  40 mg Intravenous Daily  . metronidazole  500 mg Intravenous Q8H  . multivitamin  1 tablet Oral QHS  . sennosides  10 mL Oral Daily    OBJECTIVE  Filed Vitals:   02/21/16 1100 02/21/16 1120 02/21/16 1146 02/21/16 1150  BP: 105/60 94/57  102/70  Pulse: 87 86  93  Temp:  97.4 F (36.3 C) 97.5 F (36.4 C)   TempSrc:  Oral Oral   Resp: 21 19  20   Weight:      SpO2: 99% 99%  99%    Intake/Output Summary (Last 24 hours) at 02/21/16 1229 Last data filed at 02/21/16 1120  Gross per 24 hour  Intake 817.67 ml  Output   1000 ml  Net -182.33 ml   Filed Weights   03/01/2016 0100 02/21/16 0500 02/21/16 0700  Weight: 125 lb 7.1 oz (56.9 kg) 126 lb 15.8 oz (57.6 kg) 124 lb 12.5 oz (56.6 kg)    PHYSICAL EXAM  General: Pleasant, thin WM in NAD.  Neuro:  Opens eyes but does not really respond. HEENT:  Normal  Neck: Supple without bruits or JVD. Lungs:  Coarse breath sounds. Heart: RRR no murmur. Minimal rub noted now. Abdomen: Soft, non-tender, non-distended, BS + x 4.  Extremities: No clubbing, cyanosis or edema. DP/PT/Radials 2+ and equal bilaterally.  Accessory Clinical Findings  CBC  Recent Labs  02/08/2016 0352 02/21/16 0510  WBC 23.1* 31.9*  HGB 8.5* 8.7*  HCT 28.6* 27.7*  MCV 89.1 87.1  PLT 170 381   Basic Metabolic Panel  Recent Labs  02/18/16 1700  02/27/2016 0353 02/21/16 0510  NA  --   < > 140 138  K  --   < > 3.9 4.5  CL  --   < > 99* 98*  CO2  --   < > 27 21*  GLUCOSE  --   < > 90 169*  BUN  --   < > 45* 79*  CREATININE  --   < > 2.70* 4.05*  CALCIUM  --   < > 8.2* 8.2*  MG 2.1  --   --   --   PHOS 5.3*  < > 5.0* 8.9*  < > = values in this interval not displayed. Liver Function Tests  Recent Labs  02/26/2016 0353 02/21/16 0510  ALBUMIN  1.8* 1.8*   No results for input(s): LIPASE, AMYLASE in the last 72 hours. Cardiac Enzymes No results for input(s): CKTOTAL, CKMB, CKMBINDEX, TROPONINI in the last 72 hours.  Radiology/Studies  Ct Chest Wo Contrast  03/03/2016  CLINICAL DATA:  Pneumonia. History of diabetes and hypertension. Hemodialysis patient. EXAM: CT CHEST WITHOUT CONTRAST TECHNIQUE: Multidetector CT imaging of the chest was performed following the standard protocol without IV contrast. COMPARISON:  None. FINDINGS: Cardiovascular: Cardiac enlargement. Coronary artery calcifications. Small pericardial effusion. Normal caliber thoracic aorta. Central venous catheter with tip at the cavoatrial junction. Mediastinum/Nodes: Mildly prominent lymph nodes throughout the mediastinum. Largest pretracheal nodes measuring about 15 mm diameter. The etiology is nonspecific but probably reactive. Esophagus is decompressed with an enteric tube in place. Lungs/Pleura: Patchy nodular airspace infiltrates demonstrated throughout both lungs. This could represent multifocal pneumonia, edema, or ARDS. Linear atelectasis in both lung bases. Endotracheal tube is in place. Small amount of secretions in the dependent trachea. Small bilateral pleural effusions slightly greater on the right. Upper Abdomen: Surgical absence of the gallbladder. Calcified granulomas in spleen. Vascular calcifications. Enteric tube tip is in stomach. Musculoskeletal: Mild degenerative changes in the spine. No destructive bone lesions. IMPRESSION: Cardiac enlargement with small pericardial effusion. Small bilateral pleural effusions. Patchy nodular airspace disease throughout both lungs may indicate edema or pneumonia. Findings suggest congestive failure. Electronically Signed   By: Lucienne Capers M.D.   On: 02/17/2016 21:41   Dg Chest Port 1 View  02/18/2016  CLINICAL DATA:  Respiratory failure EXAM: PORTABLE CHEST 1 VIEW COMPARISON:  CT chest 02/15/2016.  One-view chest x-ray  02/04/2016. FINDINGS: Endotracheal tube is stable. The NG tube courses off the inferior border the film. A right IJ line is stable. Bilateral pleural effusions and basilar airspace disease is unchanged. Mild edema is stable. IMPRESSION: 1. Stable bilateral pleural effusions and airspace disease. 2. Support apparatus is stable. Electronically Signed   ByHarrell Gave    TTE: 02/15/2016 - Left ventricle: The cavity size was normal. There was moderate  concentric hypertrophy. Systolic function was mildly to  moderately reduced. The estimated ejection fraction was in the  range of 40% to 45%. Diffuse hypokinesis. There is akinesis of  the inferoseptal myocardium. - Aortic valve: There was trivial regurgitation. - Mitral valve: Moderately calcified annulus. - Pericardium, extracardiac: A small pericardial effusion was  identified along the left ventricular free wall. There was no  evidence of hemodynamic compromise.  Impressions:  - Compared to the prior study on 02/07/16, there has been no significant  interval  change.   ASSESSMENT AND PLAN Principal Problem:   Acute respiratory failure with hypoxia (HCC) Active Problems:   Sepsis ([place])   ESRD (end stage renal disease) (HCC)   Protein-calorie malnutrition, severe   Acute on chronic systolic CHF (congestive heart failure), NYHA class 3 (HCC)   NSTEMI (non-ST elevated myocardial infarction) (HCC)   Anaerobic bacteremia    1. Veillonella sepsis/bacteremia. On antibiotics per ID. Unlikely organism for endocarditis.   2. Acute respiratory failure. Now with trach in place.  Oxygenating well. Combined volume overload and PNA.   3. Elevated troponin. Peak 0.5. Probably related to demand ischemia. 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 Ecg shows inferolateral ischemia that has not changed considerably. May need to consider ischemic evaluation with cardiac cath once concomitant  medical issues improved.   4. Pericardial friction rub.  Both Echo on 7/13 and CT on 7/14 show a small pericardial effusion. No evidence of tamponade. No clear symptoms.  Echo is markedly improved today. I really don't appreciate a new murmur. We had planned for bedside TEE tomorrow but I don't think we have a clear indication for this study and I think we should avoid further procedures/instrumentation unless there is a clear indication. Unless CCM or ID feels differently I will cancel TEE tomorrow.  5. ESRD on dialysis. Management of volume status per Renal.   6. Recent GI bleed.   7. Acute encephalitis in setting of critical illness.   8. Anemia  Arie Sabina [facility] 02/21/2016

## 2016-02-21 NOTE — Care Management Note (Signed)
Case Management Note  Patient Details  Name: Aadin Gaut MRN: [id number] Date of Birth: 06-19-1976  Subjective/Objective:          CM following for progression and d/c planning.           Action/Plan: 02/21/2016 Pt attempting to wean to trach collar, met with [facility] rep, Santiago Glad re possible return to [facility] and discussed with MD . Per MD he will discuss with pt sister when pt is weaned to collar, perhaps later this afternoon. Pt at this time is a candidate to return to [facility] per Santiago Glad.   Expected Discharge Date:                  Expected Discharge Plan:  Long Term Acute Care (LTAC)  In-House Referral:  Clinical Social Work  Discharge planning Services  CM Consult  Post Acute Care Choice:    Choice offered to:     DME Arranged:    DME Agency:     HH Arranged:    [place] Agency:     Status of Service:  In process, will continue to follow  If discussed at Long Length of Stay Meetings, dates discussed:    Additional Comments:  Adron Bene, RN 02/21/2016, 12:01 PM

## 2016-02-21 NOTE — Progress Notes (Signed)
Spoke with patient's sister Hydrographic surveyor) on the phone to update her on patient's status. Revisited the idea of patient going to [facility] after the ICU, patient's sister very opposed to him going to [facility] at this time. Crystal states that she would like for her brother to come home with her. Explained that patient will need aggressive physical therapy. Sister of patient was agreeable to a nursing home if needed, specifically [facility].

## 2016-02-21 NOTE — Progress Notes (Signed)
Patient ID: Tanner Campbell, male   DOB: 11-06-1975, 40 y.o.   MRN: [id number] S: Tracheostomy  placed yesterday.  Seen on HD   O:BP 98/61 mmHg  Pulse 87  Temp(Src) 98.2 F (36.8 C) (Oral)  Resp 21  Wt 124 lb 12.5 oz (56.6 kg)  SpO2 99%  Intake/Output Summary (Last 24 hours) at 02/21/16 1110 Last data filed at 02/21/16 0800  Gross per 24 hour  Intake 647.67 ml  Output      0 ml  Net 647.67 ml   Intake/Output: I/O last 3 completed shifts: In: 1257.7 [I.V.:550; NG/GT:207.7; IV Piggyback:500] Out: 0   Intake/Output this shift:  Total I/O In: 10 [I.V.:10] Out: -  Weight change: -4 lb 10.1 oz (-2.1 kg) General appearance: Tracheostomy collar in place; slow to respond but does follow commands Resp: occassional rhonchi Cardio: systolic murmur: holosystolic 3/6, blowing throughout the precordium and friction rub heard throughout precordium GI: soft, non-tender; bowel sounds normal; no masses, no organomegaly Extremities: LUE AVF +T/B, no edema Dialysis Access: LUE AVF   Recent Labs Lab 02/15/16 0637  02/28/2016 0510 02/17/16 0340 02/17/16 1030 02/17/16 1723 02/18/16 0430 02/18/16 1700 02/19/16 0405 02/09/2016 0353 02/21/16 0510  NA 131*  --  129* 139  --   --  138  --  139 140 138  K 3.7  --  3.9 3.9  --   --  3.8  --  4.9 3.9 4.5  CL 94*  --  90* 98*  --   --  100*  --  98* 99* 98*  CO2 26  --  24 29  --   --  28  --  29 27 21*  GLUCOSE 248*  --  274* 180*  --   --  128*  --  98 90 169*  BUN 56*  --  87* 55*  --   --  78*  --  99* 45* 79*  CREATININE 2.87*  --  3.57* 2.67*  --   --  3.65*  --  4.43* 2.70* 4.05*  ALBUMIN  --   --  1.5*  --   --   --  1.5*  --  1.8* 1.8* 1.8*  CALCIUM 7.9*  --  7.9* 8.1*  --   --  7.7*  --  7.8* 8.2* 8.2*  PHOS  --   < > 3.1 3.1 3.6 4.2 4.4 5.3* 6.2* 5.0* 8.9*  < > = values in this interval not displayed. Liver Function Tests:  Recent Labs Lab 02/19/16 0405 02/24/2016 0353 02/21/16 0510  ALBUMIN 1.8* 1.8* 1.8*   CBC:  Recent  Labs Lab 02/17/16 0340 02/18/16 0430 02/19/16 0405 02/06/2016 0352 02/21/16 0510  WBC 19.5* 17.7* 18.2* 23.1* 31.9*  HGB 8.6* 8.2* 8.2* 8.5* 8.7*  HCT 28.7* 26.9* 27.1* 28.6* 27.7*  MCV 87.8 88.5 88.6 89.1 87.1  PLT 200 174 167 170 168   Cardiac Enzymes:  Recent Labs Lab 02/21/2016 2000 02/17/16 0005 02/17/16 0340  TROPONINI 0.22* 0.21* 0.22*   CBG:  Recent Labs Lab 02/11/2016 0737 02/06/2016 1938 02/21/16 0004 02/21/16 0411 02/21/16 0747  GLUCAP 78 105* 164* 154* 153*    Iron Studies:   Recent Labs  02/10/2016 0352  IRON 15*  TIBC 151*  FERRITIN 2416*   Studies/Results: Portable Chest Xray  02/24/2016  CLINICAL DATA:  Acute respiratory failure. EXAM: PORTABLE CHEST 1 VIEW COMPARISON:  02/19/2016 FINDINGS: Endotracheal tube unchanged. Nasogastric tube courses into the region of the stomach and off the inferior  portion of the film as tip is not visualized. Right IJ catheter unchanged with tip just below the cavoatrial junction. Lungs are adequately inflated and demonstrate stable bibasilar opacification compatible with small effusions with associated atelectasis. Cannot exclude infection in the lung bases. Cardiomediastinal silhouette and remainder of the exam is unchanged. IMPRESSION: Stable bibasilar opacification compatible with small effusions with atelectasis. Cannot exclude infection in the lung bases. Tubes and lines unchanged. Electronically Signed   By: Marin Olp M.D.   On: 02/13/2016 07:14   Portable Chest Xray  02/19/2016  CLINICAL DATA:  Acute respiratory failure EXAM: PORTABLE CHEST 1 VIEW COMPARISON:  Chest x-ray from earlier same day and chest x-ray dated 02/18/2016. FINDINGS: Endotracheal tube remains well positioned with tip approximately 3 cm above the carina. Right IJ central line is stable in position with tip at the level of the cavoatrial junction. Mild cardiomegaly is stable. Overall cardiomediastinal silhouette is stable in size and configuration.  Bilateral pleural effusions appear stable, both small to moderate in size. Additional patchy opacities at each lung base could represent pneumonia or atelectasis. Mild interstitial edema persist bilaterally. IMPRESSION: 1. Stable bilateral pleural effusions. Also stable patchy opacities at each lung base which could represent pneumonia or atelectasis. 2. Stable mild interstitial edema. 3. No new lung findings. 4. Support apparatus is stable in position. Electronically Signed   By: Franki Cabot M.D.   On: 02/19/2016 12:05   . antiseptic oral rinse  7 mL Mouth Rinse QID  . atorvastatin  80 mg Per Tube q1800  . ceFEPime (MAXIPIME) IV  2 g Intravenous Q M,W,F-1800  . chlorhexidine gluconate (SAGE KIT)  15 mL Mouth Rinse BID  . darbepoetin (ARANESP) injection - DIALYSIS  100 mcg Intravenous Q Wed-HD  . famotidine (PEPCID) IV  20 mg Intravenous Q24H  . feeding supplement (VITAL AF 1.2 CAL)  1,000 mL Per Tube Q24H  . insulin aspart  0-9 Units Subcutaneous Q4H  . [START ON 02/22/2016] methylPREDNISolone (SOLU-MEDROL) injection  40 mg Intravenous Daily  . metronidazole  500 mg Intravenous Q8H  . multivitamin  1 tablet Oral QHS  . sennosides  10 mL Oral Daily    BMET    Component Value Date/Time   NA 138 02/21/2016 0510   K 4.5 02/21/2016 0510   CL 98* 02/21/2016 0510   CO2 21* 02/21/2016 0510   GLUCOSE 169* 02/21/2016 0510   BUN 79* 02/21/2016 0510   CREATININE 4.05* 02/21/2016 0510   CALCIUM 8.2* 02/21/2016 0510   CALCIUM 8.5* 02/03/2016 0600   GFRNONAA 17* 02/21/2016 0510   GFRAA 20* 02/21/2016 0510   CBC    Component Value Date/Time   WBC 31.9* 02/21/2016 0510   RBC 3.18* 02/21/2016 0510   HGB 8.7* 02/21/2016 0510   HCT 27.7* 02/21/2016 0510   PLT 168 02/21/2016 0510   MCV 87.1 02/21/2016 0510   MCH 27.4 02/21/2016 0510   MCHC 31.4 02/21/2016 0510   RDW 16.5* 02/21/2016 0510   LYMPHSABS 0.6* 02/11/2016 0600   MONOABS 0.4 02/11/2016 0600   EOSABS 0.0 02/11/2016 0600   BASOSABS  0.0 02/11/2016 0600    Dialysis Orders: Center: [facility] on MWF .   Assessment/Plan: 1. VDRF - transferred from [facility] due to prolonged respiratory failure. PCCM managing- trach placed 7/18.  2. Heart murmer and friction rub - ECHO performed 02/11/2016 with worsened LV function and wall motion abnormality suggesting ischemia or infarct. Unclear why murmer more pronounced and with friction rub. TEE to r/o endocarditis  scheduled for 7/20. Cardiology following; cardiac cath when stable? 3. ESRD - Continue with HD MWF via AVF- undergoing dialysis this morning. BFR 400 via AVF. BP was 101/52.  4. Hypertension/volume - stable- euvolemic on exam  5. Anemia - on Aranesp 179mg- increased dose. Iron studies showing low iron and low saturation ratio. However, will hold off repleting iron at this time in the setting of bacteremia. 6. Metabolic bone disease - Will follow ca/phos. Ca stable at present. Phosphorous elevated at 8.9 likely due to continuous tube feeds. Will give binder when eating.  7. Nutrition - per primary svc 8. ID- vionella bacteremia- continue flagyl x 2 weeks as per infectious disease recommendation 9. Respiratory failure 2/2 possible PNA - continue Cefepime x 8 days as per ID recommendation  10. [employer]- has palliative care ever been involved in this case ? Missed HD and self extubation maybe he is trying to tell [place].  Now with a trach it makes placement in an OP dialysis facility much more difficult.  VShela Leff PGY 2 - IMTS  Patient seen and examined, agree with above note with above modifications. Chronically ill male on dialysis now status post trach bacteremia. Looks like plan will be to send him back to LTAC eventually. Continue with routine dialysis Monday Wednesday and Friday KCorliss Parish MD 02/21/2016

## 2016-02-21 NOTE — Progress Notes (Signed)
Pt weaned off vent to ATC 5 L 28%, sat 99%. RN notified. RT will continue to monitor.

## 2016-02-21 NOTE — Procedures (Signed)
Patient was seen on dialysis and the procedure was supervised.  BFR 400  Via AVF BP is  101/52.   Patient appears to be tolerating treatment well- small goal as volume status good- slow but eventually did respond   Tanner Campbell A 02/21/2016

## 2016-02-21 NOTE — Progress Notes (Signed)
Physical Therapy Treatment Patient Details Name: Tanner Campbell MRN: [id number] DOB: 01/01/1976 Today's Date: 02/21/2016    History of Present Illness Tanner Campbell is an 40 y.o. male with PMH of DM II, HTN, HLD, ESRD on HD (T, Th, S), COPD and prior back surgery who initially was admitted to [facility] from 6/21 - 6/29 for acute respiratory failure in the setting of volume overload. He was intubated 6/23-7/5 (self extubated), 7/10-7/16 (self extubated), now reintubated. Initially, sputum was positive for H influenza and subsequently for MSSA and Escherichia coli and blood cultures growing a gram-negative anaerobic organism. Thoracentesis on right showed borderline exudate by LDH criteria. Plan for trach today 7/18 at 5:30 pm.      PT Comments    Pt admitted with above diagnosis. Pt currently with functional limitations due to balance and endurance deficits. Pt was able to sit EOB with +2 assist needing mod to max assist to come to EOB.  However once positioned pt was able to sit 12 min with min guard assist.  Pt still not following commands consistently.  Will continue PT.   Pt will benefit from skilled PT to increase their independence and safety with mobility to allow discharge to the venue listed below.    Follow Up Recommendations  CIR;Supervision/Assistance - 24 hour     Equipment Recommendations  Other (comment) (TBA)    Recommendations for Other Services Rehab consult     Precautions / Restrictions Precautions Precautions: Fall Restrictions Weight Bearing Restrictions: No    Mobility  Bed Mobility Overal bed mobility: Needs Assistance;+2 for physical assistance Bed Mobility: Supine to Sit Rolling: +2 for physical assistance;Mod assist   Supine to sit: Max assist;+2 for physical assistance     General bed mobility comments: Used pad to assist pt to EOB via helicopter technique.  Took incr time for pt to get balance at EOB.   Transfers                     Ambulation/Gait                 Stairs            Wheelchair Mobility    Modified Rankin (Stroke Patients Only)       Balance Overall balance assessment: Needs assistance;History of Falls Sitting-balance support: Bilateral upper extremity supported;Feet supported Sitting balance-Leahy Scale: Poor Sitting balance - Comments: Initially upon sitting, pt needed mod to max assist to sit EOB but once pt got situated, he was able to sit with min guard assist for 12 minutes.  Pt would never kick LEs or move UEs to command but he was able to sit EOB for a bit. Cued pt to lift head but pt did not respond.  lifted head manually and pt did attempt to hold his head up on his own.                             Cognition Arousal/Alertness: Awake/alert Behavior During Therapy: Flat affect;Restless Overall Cognitive Status: Difficult to assess                      Exercises General Exercises - Lower Extremity Long Arc Quad: PROM;Both;5 reps;Seated    General Comments General comments (skin integrity, edema, etc.): Left pt in 60 degree chair position postioined with pillows.        Pertinent Vitals/Pain Pain Assessment: Faces Faces Pain Scale: Hurts little  more Pain Location: generalized Pain Descriptors / Indicators: Grimacing Pain Intervention(s): Limited activity within patient's tolerance;Monitored during session;Repositioned  VSS with pt on 28% trach collar with HR 98-110 bpm, 136/54, 99% O2 sats.   Home Living                      Prior Function            PT Goals (current goals can now be found in the care plan section) Progress towards PT goals: Progressing toward goals    Frequency  Min 3X/week    PT Plan Current plan remains appropriate    Co-evaluation             End of Session Equipment Utilized During Treatment: Gait belt;Oxygen (trach) Activity Tolerance: Patient limited by fatigue;Patient limited by  lethargy Patient left: in bed;with call bell/phone within reach;with bed alarm set;with restraints reapplied     Time: 7510-2585 PT Time Calculation (min) (ACUTE ONLY): 20 min  Charges:  $Therapeutic Activity: 8-22 mins                    G CodesTawni Millers Campbell 2016-02-29, 4:55 PM [employer] [facility] [phone] (pager)

## 2016-02-21 NOTE — Progress Notes (Signed)
Tanner Campbell for Infectious Disease    Date of Admission:  02/05/2016   Day 6 Cefepime Day 3 metronidazole   Principal Problem:   Acute respiratory failure with hypoxia (HCC) Active Problems:   Sepsis ([place])   NSTEMI (non-ST elevated myocardial infarction) (HCC)   Anaerobic bacteremia   ESRD (end stage renal disease) (HCC)   Protein-calorie malnutrition, severe   Acute on chronic systolic CHF (congestive heart failure), NYHA class 3 ([place])   . antiseptic oral rinse  7 mL Mouth Rinse QID  . atorvastatin  80 mg Per Tube q1800  . ceFEPime (MAXIPIME) IV  2 g Intravenous Q M,W,F-1800  . chlorhexidine gluconate (SAGE KIT)  15 mL Mouth Rinse BID  . darbepoetin (ARANESP) injection - DIALYSIS  100 mcg Intravenous Q Wed-HD  . famotidine (PEPCID) IV  20 mg Intravenous Q24H  . feeding supplement (VITAL AF 1.2 CAL)  1,000 mL Per Tube Q24H  . insulin aspart  0-9 Units Subcutaneous Q4H  . [START ON 02/22/2016] methylPREDNISolone (SOLU-MEDROL) injection  40 mg Intravenous Daily  . metronidazole  500 mg Intravenous Q8H  . multivitamin  1 tablet Oral QHS  . sennosides  10 mL Oral Daily    SUBJECTIVE: He underwent tracheostomy yesterday without complication. No fever since 1am yesterday morning. Seen on HD today and he has some weak coughing.  Review of Systems: Review of Systems  Constitutional: Negative for fever.  Respiratory: Positive for cough.   Cardiovascular: Negative for leg swelling.  Gastrointestinal: Negative for diarrhea.  Skin: Positive for rash.    Past Medical History  Diagnosis Date  . Diabetes mellitus without complication ([place])   . Hypertension   . COPD (chronic obstructive pulmonary disease) ([place])   . Renal disorder   . Neuropathy ([place])   . Hemodialysis patient ([place])   . High cholesterol      Social History  Substance Use Topics  . Smoking status: Current Every Day Smoker  . Smokeless tobacco: None  . Alcohol Use: No    Family History  Problem Relation Age of Onset  . Rheum arthritis Neg Hx   . Osteoarthritis Neg Hx   . Asthma Neg Hx   . Cancer Neg Hx   . Diabetes Neg Hx    Allergies  Allergen Reactions  . Penicillins Other (See Comments)    Reaction: Unknown    OBJECTIVE: Filed Vitals:   02/21/16 1000 02/21/16 1030 02/21/16 1100 02/21/16 1120  BP: 93/56 98/61 105/60 94/57  Pulse: 95 98 87 86  Temp:    97.4 F (36.3 C)  TempSrc:    Oral  Resp: _0 Weight:      SpO2: 100% 100% 99% 99%   Body mass index is 20.76 kg/(m^2).  Physical Exam  GENERAL- sedated, arousable to voice but does not stay attentive HEENT- Trachesotomy CARDIAC- RRR, rub present RESP- Crackles, noisy upper airway sounds EXTREMITIES- pulse 2+, symmetric, no pedal edema. SKIN- scattered hyperpigmented rash on both legs   Lab Results Lab Results  Component Value Date   WBC 31.9* 02/21/2016   HGB 8.7* 02/21/2016   HCT 27.7* 02/21/2016   MCV 87.1 02/21/2016   PLT 168 02/21/2016    Lab Results  Component Value Date   CREATININE 4.05* 02/21/2016   BUN 79* 02/21/2016   NA 138 02/21/2016   K 4.5 02/21/2016   CL 98* 02/21/2016   CO2 21* 02/21/2016    Lab Results  Component  Value Date   ALT 50 02/11/2016   AST 42* 02/11/2016   ALKPHOS 281* 02/11/2016   BILITOT 1.4* 02/11/2016     Microbiology: Recent Results (from the past 240 hour(s))  Culture, respiratory (NON-Expectorated)     Status: None   Collection Time: 02/12/16  1:59 PM  Result Value Ref Range Status   Specimen Description TRACHEAL ASPIRATE  Final   Special Requests NONE  Final   Gram Stain   Final    ABUNDANT WBC PRESENT,BOTH PMN AND MONONUCLEAR ABUNDANT GRAM NEGATIVE RODS FEW GRAM POSITIVE COCCI IN PAIRS    Culture   Final    MODERATE STAPHYLOCOCCUS AUREUS MODERATE ESCHERICHIA COLI     Report Status 02/15/2016 FINAL  Final   Organism ID, Bacteria ESCHERICHIA COLI  Final   Organism ID, Bacteria STAPHYLOCOCCUS AUREUS  Final      Susceptibility   Staphylococcus aureus - MIC*    CIPROFLOXACIN <=0.5 SENSITIVE Sensitive     ERYTHROMYCIN 0.5 SENSITIVE Sensitive     GENTAMICIN <=0.5 SENSITIVE Sensitive     OXACILLIN <=0.25 SENSITIVE Sensitive     TETRACYCLINE <=1 SENSITIVE Sensitive     VANCOMYCIN 1 SENSITIVE Sensitive     TRIMETH/SULFA <=10 SENSITIVE Sensitive     CLINDAMYCIN <=0.25 SENSITIVE Sensitive     RIFAMPIN <=0.5 SENSITIVE Sensitive     Inducible Clindamycin NEGATIVE Sensitive     * MODERATE STAPHYLOCOCCUS AUREUS   Escherichia coli - MIC*    AMPICILLIN >=32 RESISTANT Resistant     CEFAZOLIN <=4 SENSITIVE Sensitive     CEFEPIME <=1 SENSITIVE Sensitive     CEFTAZIDIME <=1 SENSITIVE Sensitive     CEFTRIAXONE <=1 SENSITIVE Sensitive     CIPROFLOXACIN <=0.25 SENSITIVE Sensitive     GENTAMICIN <=1 SENSITIVE Sensitive     IMIPENEM <=0.25 SENSITIVE Sensitive     TRIMETH/SULFA >=320 RESISTANT Resistant     AMPICILLIN/SULBACTAM >=32 RESISTANT Resistant     PIP/TAZO <=4 SENSITIVE Sensitive     * MODERATE ESCHERICHIA COLI  Culture, blood (routine x 2)     Status: Abnormal (Preliminary result)   Collection Time: 02/12/16  4:00 PM  Result Value Ref Range Status   Specimen Description BLOOD RIGHT HAND  Final   Special Requests BOTTLES DRAWN AEROBIC AND ANAEROBIC 5CC  Final   Culture  Setup Time   Final    GRAM NEGATIVE COCCOBACILLI ANAEROBIC BOTTLE ONLY CRITICAL RESULT CALLED TO, READ BACK BY AND VERIFIED WITH: J. FURIGAY, RN AT 1415 ON  02/14/16 BY M. Devona Konig, MT.    Culture (A)  Final    VEILLONELLA SPECIES ANAEROBIC GRAM NEGATIVE COCCOBACILLI    Report Status PENDING  Incomplete  Blood Culture ID Panel (Reflexed)     Status: None   Collection Time: 02/12/16  4:00 PM  Result Value Ref Range Status   Enterococcus species NOT DETECTED NOT DETECTED Final   Vancomycin  resistance NOT DETECTED NOT DETECTED Final   Listeria monocytogenes NOT DETECTED NOT DETECTED Final   Staphylococcus species NOT DETECTED NOT DETECTED Final   Staphylococcus aureus NOT DETECTED NOT DETECTED Final   Methicillin resistance NOT DETECTED NOT DETECTED Final   Streptococcus species NOT DETECTED NOT DETECTED Final   Streptococcus agalactiae NOT DETECTED NOT DETECTED Final   Streptococcus pneumoniae NOT DETECTED NOT DETECTED Final   Streptococcus pyogenes NOT DETECTED NOT DETECTED Final   Acinetobacter baumannii NOT DETECTED NOT DETECTED Final   Enterobacteriaceae species NOT DETECTED NOT DETECTED Final   Enterobacter cloacae complex NOT DETECTED NOT DETECTED  Final   Escherichia coli NOT DETECTED NOT DETECTED Final   Klebsiella oxytoca NOT DETECTED NOT DETECTED Final   Klebsiella pneumoniae NOT DETECTED NOT DETECTED Final   Proteus species NOT DETECTED NOT DETECTED Final   Serratia marcescens NOT DETECTED NOT DETECTED Final   Carbapenem resistance NOT DETECTED NOT DETECTED Final   Haemophilus influenzae NOT DETECTED NOT DETECTED Final   Neisseria meningitidis NOT DETECTED NOT DETECTED Final   Pseudomonas aeruginosa NOT DETECTED NOT DETECTED Final   Candida albicans NOT DETECTED NOT DETECTED Final   Candida glabrata NOT DETECTED NOT DETECTED Final   Candida krusei NOT DETECTED NOT DETECTED Final   Candida parapsilosis NOT DETECTED NOT DETECTED Final   Candida tropicalis NOT DETECTED NOT DETECTED Final  Culture, blood (routine x 2)     Status: Abnormal (Preliminary result)   Collection Time: 02/12/16  4:07 PM  Result Value Ref Range Status   Specimen Description BLOOD RIGHT HAND  Final   Special Requests BOTTLES DRAWN AEROBIC AND ANAEROBIC 5CC  Final   Culture  Setup Time   Final    GRAM NEGATIVE COCCOBACILLI ANAEROBIC BOTTLE ONLY CRITICAL RESULT CALLED TO, READ BACK BY AND VERIFIED WITH: J. FURIGAY, RN AT 1415 ON  02/14/16 BY Heath Lark, MT.    Culture (A)  Final     VEILLONELLA SPECIES ANAEROBIC GRAM NEGATIVE COCCOBACILLI IDENTIFICATION TO FOLLOW    Report Status PENDING  Incomplete  MRSA PCR Screening     Status: None   Collection Time: 02/15/2016  6:48 PM  Result Value Ref Range Status   MRSA by PCR NEGATIVE NEGATIVE Final    Comment:        The GeneXpert MRSA Assay (FDA approved for NASAL specimens only), is one component of a comprehensive MRSA colonization surveillance program. It is not intended to diagnose MRSA infection nor to guide or monitor treatment for MRSA infections.      ASSESSMENT: Veillonella bacteremia: Atypical infection 2/2 cultures positive and he appears to be improving on Flagyl. TTE negative for endocarditis and TEE is not absolutely indicated without persistent fever or bacteremia. If these occur he will need a more extensive treatment.  Respiratory failure: Possible HCAP versus pulmonary edema +/- COPD or neuromuscular components. Given his severity of illness finishing cefepime for a usual duration days makes sense.  PLAN: 1. Continue metronidazole for 2 weeks total (7/17-7/30) 2. Continue cefepime for 8 days total (7/14-7/21)  Collier Salina, MD PGY-II Internal Medicine Resident Pager# [phone] 02/21/2016, 11:43 AM

## 2016-02-22 ENCOUNTER — Other Ambulatory Visit (HOSPITAL_COMMUNITY): Payer: Medicare Other

## 2016-02-22 LAB — RENAL FUNCTION PANEL
Albumin: 1.7 g/dL — ABNORMAL LOW (ref 3.5–5.0)
Anion gap: 9 (ref 5–15)
BUN: 49 mg/dL — AB (ref 6–20)
CHLORIDE: 102 mmol/L (ref 101–111)
CO2: 27 mmol/L (ref 22–32)
CREATININE: 2.74 mg/dL — AB (ref 0.61–1.24)
Calcium: 7.8 mg/dL — ABNORMAL LOW (ref 8.9–10.3)
GFR calc Af Amer: 31 mL/min — ABNORMAL LOW (ref >60)
GFR calc non Af Amer: 27 mL/min — ABNORMAL LOW (ref >60)
GLUCOSE: 120 mg/dL — AB (ref 65–99)
Phosphorus: 4.1 mg/dL (ref 2.5–4.6)
Potassium: 3.6 mmol/L (ref 3.5–5.1)
Sodium: 138 mmol/L (ref 135–145)

## 2016-02-22 LAB — GLUCOSE, CAPILLARY
GLUCOSE-CAPILLARY: 138 mg/dL — AB (ref 65–99)
GLUCOSE-CAPILLARY: 113 mg/dL — AB (ref 65–99)
GLUCOSE-CAPILLARY: 147 mg/dL — AB (ref 65–99)
Glucose-Capillary: 123 mg/dL — ABNORMAL HIGH (ref 65–99)
Glucose-Capillary: 137 mg/dL — ABNORMAL HIGH (ref 65–99)
Glucose-Capillary: 163 mg/dL — ABNORMAL HIGH (ref 65–99)

## 2016-02-22 LAB — CBC
HCT: 26.7 % — ABNORMAL LOW (ref 39.0–52.0)
Hemoglobin: 8.2 g/dL — ABNORMAL LOW (ref 13.0–17.0)
MCH: 26.9 pg (ref 26.0–34.0)
MCHC: 30.7 g/dL (ref 30.0–36.0)
MCV: 87.5 fL (ref 78.0–100.0)
PLATELETS: 131 10*3/uL — AB (ref 150–400)
RBC: 3.05 MIL/uL — AB (ref 4.22–5.81)
RDW: 16.5 % — ABNORMAL HIGH (ref 11.5–15.5)
WBC: 19.7 10*3/uL — ABNORMAL HIGH (ref 4.0–10.5)

## 2016-02-22 MED ORDER — METRONIDAZOLE 50 MG/ML ORAL SUSPENSION
500.0000 mg | Freq: Three times a day (TID) | ORAL | Status: DC
  Administered 2016-02-22 – 2016-02-24 (×5): 500 mg via ORAL
  Filled 2016-02-22 (×9): qty 10

## 2016-02-22 MED ORDER — SENNOSIDES 8.8 MG/5ML PO SYRP
10.0000 mL | ORAL_SOLUTION | Freq: Every evening | ORAL | Status: DC | PRN
  Administered 2016-02-25: 10 mL via ORAL
  Filled 2016-02-22 (×2): qty 10

## 2016-02-22 NOTE — Care Management Important Message (Signed)
Important Message  Patient Details  Name: Tanner Campbell MRN: [id number] Date of Birth: 10/30/1975   Medicare Important Message Given:  Yes    [name] 02/22/2016, 10:53 AM

## 2016-02-22 NOTE — Progress Notes (Signed)
Patient was supposed to have TEE this afternoon but no orders were placed.  There was confusion with the nursing staff who thought the procedure was cancelled so tube feeds were not shut off at MN and were shut off at lunch.  Patient is not intubated and is fully awake and will require sedation.  Will place orders for TEE in am and tube feeds to stop at MN tonight.

## 2016-02-22 NOTE — Progress Notes (Signed)
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 He was admitted by the hospitalist service for further evaluation. He was treated with BiPAP on admit. Unfortunately, he decompensated 6/23 and required intubation. The patient was found to have H. Influenza positive sputum and treated with rocephin. CXR demonstrated a large right pleural effusion and underwent a thoracentesis (1L serous fluid removed, likely exudative by protein). He had difficulty with weaning and was transferred to [facility] on 6/29 orally intubated for further evaluation / ventilator weaning. The patient developed fever and antibiotics were expanded to vancomycin, cefepime and diflucan. He had progressed to weaning on PSV x 4 hours as of 7/3. He self extubated on 7/5. Since that time has required BiPAP off and on. After HD 7/10 his mental status worsened and he became minimally responsive. CXR consistent with RLL opacification and he was re-intubated. He was re-cultured at this point, and Also was found to have increased Troponin for which cardiology was consulted. He was placed on LMWH and apparently had GIB while on this and his hgb dropped as low as 8.4 from 9.3. As of 7/14 he remains ventilator dependent. His sputum cultures are growing MSSA and e coli and also has positive blood cultures. Because of all of his overwhelming issues it was felt best to transfer him to a higher level of care.   SUBJECTIVE:  No events overnight. Pt remained on trach collar and did well.  VITAL SIGNS: BP 153/40 mmHg  Pulse 95  Temp(Src)  98.3 F (36.8 C) (Oral)  Resp 21  Wt 57.3 kg (126 lb 5.2 oz)  SpO2 99%  VENTILATOR SETTINGS: Vent Mode:  [-] CPAP;PSV FiO2 (%):  [28 %-30 %] 28 % PEEP:  [5 cmH20] 5 cmH20 Pressure Support:  [8 cmH20] 8 cmH20  INTAKE / OUTPUT: I/O last 3 completed shifts: In: 1527.7 [I.V.:360; NG/GT:667.7; IV Piggyback:500] Out: 1000 [Other:1000]  PHYSICAL EXAMINATION: General: Awake, alert, in NAD Neuro: Follows commands HEENT: Trach Cardiac: regular, friction rub present Chest: normal work of breathing, pleural rubs auscultated, diminished breath sounds in the lung bases Abd: soft, non tender Ext: no edema, decreased muscle bulk Skin: multiple areas of healing excoriations on the lower extremities  LABS:  BMET  Recent Labs Lab 02/14/2016 0353 02/21/16 0510 02/22/16 0340  NA 140 138 138  K 3.9 4.5 3.6  CL 99* 98* 102  CO2 27 21* 27  BUN 45* 79* 49*  CREATININE 2.70* 4.05* 2.74*  GLUCOSE 90 169* 120*    Electrolytes  Recent Labs Lab 02/17/16 1723 02/18/16 0430 02/18/16 1700  02/18/2016 0353 02/21/16 0510 02/22/16 0340  CALCIUM  --  7.7*  --   < > 8.2* 8.2* 7.8*  MG 2.1 2.1 2.1  --   --   --   --   PHOS 4.2 4.4 5.3*  < > 5.0* 8.9* 4.1  < > = values in this interval not displayed.  CBC  Recent Labs Lab 02/05/2016 0352 02/21/16 0510 02/22/16 0340  WBC 23.1* 31.9* 19.7*  HGB 8.5* 8.7* 8.2*  HCT 28.6* 27.7* 26.7*  PLT 170 168 131*    Coag's  Recent Labs Lab 02/12/2016 2000 02/07/2016 0352  APTT 33  --   INR 1.15 1.13    Sepsis Markers  Recent Labs Lab 02/17/2016 2000 02/17/16 0340 02/18/16 0430  PROCALCITON 11.92 11.54 7.55    ABG  Recent Labs Lab 02/26/2016 1840 02/17/16 0434 02/19/16 1609  PHART 7.511* 7.489* 7.409  PCO2ART 38.5 37.8 46.8*  PO2ART 94.0 96.0 67.0*    Liver Enzymes  Recent Labs Lab 02/11/2016 0353 02/21/16 0510 02/22/16 0340  ALBUMIN 1.8* 1.8* 1.7*    Cardiac Enzymes  Recent Labs Lab 02/06/2016 2000 02/17/16 0005  02/17/16 0340  TROPONINI 0.22* 0.21* 0.22*    Glucose  Recent Labs Lab 02/21/16 0747 02/21/16 1142 02/21/16 1534 02/21/16 2002 02/21/16 2340 02/22/16 0351  GLUCAP 153* 145* 174* 151* 137* 123*    Imaging No results found.   STUDIES:  7/13 Echo >> EF 40 to 45%, mod LVH, small pericardial effusion 7/17 CXR >> stable bilateral pleural effusions, stable patchy opacities in the lung bases- pneumonia vs atelectasis ANA neg  CULTURES: BCX2 7/10: Veillonella species (Gram negative coccobacilli) resp culture 7/10: Moderate SA and E.coli (both pan sensitive)  ANTIBIOTICS: cipro 7/12>>>7/14 vanc 7/2>>>7/17 Cefepime 7/14>>> Fluconazole 7/7>>>7/17 Flagyl 7/17 >>  SIGNIFICANT EVENTS: 7/14 Transfer from [facility] to [facility] 7/18 Trach placed  LINES/TUBES: OETT 7/10>>>7/17; 7/17 >>> Right IJ PICC (? Insertion date)>>> Trach 7/18   DISCUSSION: 40 yo male was at [facility] with PNA, VDRF and failure to wean.  Transferred to [facility] for rehab and weaning.  Developed bacteremia with HCAP in [facility] and recurrent VDRF.  Developed NSTEMI and new murmur.  Transferred to [facility] for further management.   ASSESSMENT / PLAN:  PULMONARY A: Acute hypoxic respiratory failure - s/p trach 7/18 ?HCAP Bilateral pleural effusions w/ recent right effusion s/p thoracentesis P:   S/p Trach, off vent D/c Solumedrol  CARDIOVASCULAR A:  Recent NSTEMI vs demand ischemia. Acute systolic CHF Small pericardial effusion with friction rub- infectious vs uremia? P:  Cardiology consulted, appreciate assistance. May need to consider cardiac cath when medical issues improve. Bedside TEE at 3pm today. Metoprolol IV 5mg  prn  RENAL A:   ESRD HD dependent  P:   Nephrology consulted, appreciate assistance. HD MWF. Replace electrolytes as needed  GASTROINTESTINAL A:   Recent GIB on LMWH P:   Trend cbc  PPI via NGT Tube feeds  HEMATOLOGIC A:   Anemia of chronic disease- stable P:  Trend CBC SCDs Aranesp per  Nephro  INFECTIOUS A:   Bacteremia with Veillonella species ?HCAP P:   ID consulted, appreciate assistance. Continue Flagyl x 2 weeks for bacteremia. Continue Cefepime x 8 days.   ENDOCRINE A:   DM type II w/ hyperglycemia  Secondary hyperparathyroidism  Steroid dependence per previous notes- unclear why? P:   Sensitive SSI  NEUROLOGIC A:   Acute Encephalopathy in setting of critical illness- resolved.  P:   RASS goal: 0  DISPO PT recommending CIR CIR feels that he is more appropriate for LTACH Sister does not want him to go back to [facility] because she didn't feel he was being treated well May be a candidate for SNF? Can likely transfer to stepdown today.   Willadean Carol, MD Family Medicine, PGY-2

## 2016-02-22 NOTE — Progress Notes (Signed)
Nutrition Follow-up  DOCUMENTATION CODES:   Severe malnutrition in context of acute illness/injury  INTERVENTION:    Recommend increase Vital AF 1.2 to goal rate of 60 ml/h to provide 1728 kcal, 108 gm protein, 1168 ml free water daily.  NUTRITION DIAGNOSIS:   Increased nutrient needs related to acute illness as evidenced by estimated needs.  Ongoing  GOAL:   Patient will meet greater than or equal to 90% of their needs  Unmet  MONITOR:   TF tolerance, Labs, Weight trends, Diet advancement, Vent status, I & O's, Skin  ASSESSMENT:   40 year old esrd admitted initially to [facility] from [facility] d/t PNA and failure to wean. Self extubated 7/5. Clinically deteriorated over the following days. Required re-intubation 7/10 w/ findings of new HCAP and bacteremia. Transferred to [facility] on 7/14 with concerns for sepsis, NSTEMI, GIB, and endocarditis.    Discussed patient in ICU rounds and with RN today. Patient is now on trach collar. Plans for SLP evaluation for PMV and eventually swallow evaluation. Currently NPO, TF on hold for TEE. NGT in place. TF order: Vital AF 1.2 at 20 ml/h (480 ml/day) to provide 576 kcals, 36 gm protein, 389 ml free water daily.   Diet Order:  Diet NPO time specified Diet NPO time specified  Skin:  Wound (see comment) (stage II to sacrum)  Last BM:  7/19  Height:   Ht Readings from Last 1 Encounters:  01/26/16 5\' 5"  (1.651 m)    Weight:   Wt Readings from Last 1 Encounters:  02/22/16 126 lb 5.2 oz (57.3 kg)    Ideal Body Weight:  59.1 kg  BMI:  Body mass index is 21.02 kg/(m^2).  Estimated Nutritional Needs:   Kcal:  1700-1900  Protein:  85-100 gm  Fluid:  1.8 L  EDUCATION NEEDS:   No education needs identified at this time  Joaquin Courts, RD, LDN, CNSC Pager [phone] After Hours Pager [phone]

## 2016-02-22 NOTE — Progress Notes (Signed)
Patient Name: Tanner Campbell Date of Encounter: 02/22/2016   Primary Cardiologist: new (Dr. Debara Pickett) Pt. Profile: Tanner Campbell is 40 yo male with PMH of HTN, HLD, DM, Multiple sclerosis, COPD and ESRD on HD since 05/02/2014 presented with acute resp distress, failed multiple extubation attempt. Acute respiratory failure felt to be due to a combination of Haemophilus pneumonia and volume overload. Patient has missed at least 3 episodes of hemodialysis before presentation, nephrology managing hemodialysis.Found to have mildly elevated trop on 7/7-7/8.   Cardiology consulted 7/10 for elevated troponin and EKG changes. An echocardiogram 02/07/16 a mildly reduced LVEF of 45-50% however there is akinesis of the mid inferior and inferoseptal myocardium, mild aortic insufficiency and a small pericardial effusion. A repeat EKG 7/13 showed worsening deep T-wave inversions in the antero-lateral leads with high voltage as well as inferior T-wave inversions. Advice to avoid ASA in setting of acute GI bleed.   Echo done 02/15/16 showed slightly worse LV function with focal inferior wall motion abnormality, which could suggest severe ischemia or infarct in this territory. There is a small pericardial effusion which is stable. Resumed ASA.   In term, his sputum cultures are growing MSSA and e coli and also has positive blood cultures with Veillonella The patient was noted to have a new friction rub and ? Murmur   Peak of troponin was 0.50 02/12/16. Now trending down.   Patient now with trach in place  SUBJECTIVE Patient is arousable but not conversant. Trach collar in place.   CURRENT MEDS . antiseptic oral rinse  7 mL Mouth Rinse QID  . atorvastatin  80 mg Per Tube q1800  . ceFEPime (MAXIPIME) IV  2 g Intravenous Q M,W,F-1800  . chlorhexidine gluconate (SAGE KIT)  15 mL Mouth Rinse BID  . darbepoetin (ARANESP) injection - DIALYSIS  100 mcg Intravenous Q Wed-HD  . famotidine (PEPCID) IV  20 mg Intravenous  Q24H  . feeding supplement (VITAL AF 1.2 CAL)  1,000 mL Per Tube Q24H  . insulin aspart  0-9 Units Subcutaneous Q4H  . metronidazole  500 mg Intravenous Q8H  . multivitamin  1 tablet Oral QHS  . sennosides  10 mL Oral Daily    OBJECTIVE  Filed Vitals:   02/22/16 0400 02/22/16 0453 02/22/16 0500 02/22/16 0600  BP: 136/46  153/44 153/40  Pulse: 107  101 95  Temp:      TempSrc:      Resp: _0 Weight:  126 lb 5.2 oz (57.3 kg)    SpO2: 100%  100% 99%    Intake/Output Summary (Last 24 hours) at 02/22/16 0748 Last data filed at 02/22/16 0700  Gross per 24 hour  Intake   1220 ml  Output   1000 ml  Net    220 ml   Filed Weights   02/21/16 0500 02/21/16 0700 02/22/16 0453  Weight: 126 lb 15.8 oz (57.6 kg) 124 lb 12.5 oz (56.6 kg) 126 lb 5.2 oz (57.3 kg)    PHYSICAL EXAM  General: Pleasant, thin WM in NAD.  Neuro:  Opens eyes but does not really respond. Blank stare. HEENT:  Normal  Neck: Supple without bruits or JVD. Lungs:  Coarse breath sounds. Heart: RRR no murmur. He has a coarse predominantly systolic sound in the upper sternal area.  Abdomen: Soft, non-tender, non-distended, BS + x 4.  Extremities: No clubbing, cyanosis or edema. DP/PT/Radials 2+ and equal bilaterally.  Accessory Clinical Findings  CBC  Recent Labs  02/21/16 0510  02/22/16 0340  WBC 31.9* 19.7*  HGB 8.7* 8.2*  HCT 27.7* 26.7*  MCV 87.1 87.5  PLT 168 914*   Basic Metabolic Panel  Recent Labs  02/21/16 0510 02/22/16 0340  NA 138 138  K 4.5 3.6  CL 98* 102  CO2 21* 27  GLUCOSE 169* 120*  BUN 79* 49*  CREATININE 4.05* 2.74*  CALCIUM 8.2* 7.8*  PHOS 8.9* 4.1   Liver Function Tests  Recent Labs  02/21/16 0510 02/22/16 0340  ALBUMIN 1.8* 1.7*   No results for input(s): LIPASE, AMYLASE in the last 72 hours. Cardiac Enzymes No results for input(s): CKTOTAL, CKMB, CKMBINDEX, TROPONINI in the last 72 hours.  Radiology/Studies  Ct Chest Wo Contrast  02/29/2016  CLINICAL  DATA:  Pneumonia. History of diabetes and hypertension. Hemodialysis patient. EXAM: CT CHEST WITHOUT CONTRAST TECHNIQUE: Multidetector CT imaging of the chest was performed following the standard protocol without IV contrast. COMPARISON:  None. FINDINGS: Cardiovascular: Cardiac enlargement. Coronary artery calcifications. Small pericardial effusion. Normal caliber thoracic aorta. Central venous catheter with tip at the cavoatrial junction. Mediastinum/Nodes: Mildly prominent lymph nodes throughout the mediastinum. Largest pretracheal nodes measuring about 15 mm diameter. The etiology is nonspecific but probably reactive. Esophagus is decompressed with an enteric tube in place. Lungs/Pleura: Patchy nodular airspace infiltrates demonstrated throughout both lungs. This could represent multifocal pneumonia, edema, or ARDS. Linear atelectasis in both lung bases. Endotracheal tube is in place. Small amount of secretions in the dependent trachea. Small bilateral pleural effusions slightly greater on the right. Upper Abdomen: Surgical absence of the gallbladder. Calcified granulomas in spleen. Vascular calcifications. Enteric tube tip is in stomach. Musculoskeletal: Mild degenerative changes in the spine. No destructive bone lesions. IMPRESSION: Cardiac enlargement with small pericardial effusion. Small bilateral pleural effusions. Patchy nodular airspace disease throughout both lungs may indicate edema or pneumonia. Findings suggest congestive failure. Electronically Signed   By: Lucienne Capers M.D.   On: 02/12/2016 21:41   Dg Chest Port 1 View  02/18/2016  CLINICAL DATA:  Respiratory failure EXAM: PORTABLE CHEST 1 VIEW COMPARISON:  CT chest 02/08/2016.  One-view chest x-ray 02/08/2016. FINDINGS: Endotracheal tube is stable. The NG tube courses off the inferior border the film. A right IJ line is stable. Bilateral pleural effusions and basilar airspace disease is unchanged. Mild edema is stable. IMPRESSION: 1. Stable  bilateral pleural effusions and airspace disease. 2. Support apparatus is stable. Electronically Signed   ByHarrell Gave    TTE: 02/15/2016 - Left ventricle: The cavity size was normal. There was moderate  concentric hypertrophy. Systolic function was mildly to  moderately reduced. The estimated ejection fraction was in the  range of 40% to 45%. Diffuse hypokinesis. There is akinesis of  the inferoseptal myocardium. - Aortic valve: There was trivial regurgitation. - Mitral valve: Moderately calcified annulus. - Pericardium, extracardiac: A small pericardial effusion was  identified along the left ventricular free wall. There was no  evidence of hemodynamic compromise.  Impressions:  - Compared to the prior study on 02/07/16, there has been no significant  interval change.   ASSESSMENT AND PLAN Principal Problem:   Acute respiratory failure with hypoxia (HCC) Active Problems:   Sepsis ([place])   ESRD (end stage renal disease) (HCC)   Protein-calorie malnutrition, severe   Acute on chronic systolic CHF (congestive heart failure), NYHA class 3 (HCC)   NSTEMI (non-ST elevated myocardial infarction) (HCC)   Anaerobic bacteremia    1. Veillonella sepsis/bacteremia. On antibiotics per ID.   2. Acute respiratory failure.  Now with trach in place.  Oxygenating well. Combined volume overload and PNA.   3. Elevated troponin. Peak 0.5. Probably related to demand ischemia. 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 Ecg shows inferolateral ischemia that has not changed considerably. May need to consider ischemic evaluation with cardiac cath once concomitant medical issues improved.   4. Pericardial friction rub/ ? murmur.  More audible today than yesterday. Both Echo on 7/13 and CT on 7/14 show a small pericardial effusion. No evidence of tamponade. No clear symptoms.  Plan  for bedside TEE today at 3 pm to clarify.  Repeat blood  cultures are negative to date.  5. ESRD on dialysis. Management of volume status per Renal.   6. Recent GI bleed.   7. Acute encephalitis in setting of critical illness.   8. Anemia  Arie [facility] 02/22/2016

## 2016-02-22 NOTE — Progress Notes (Signed)
Subjective: Pt is off vent. On trach collar. No trach issues.  Objective: Vital signs in last 24 hours: Temp:  [97.4 F (36.3 C)-99.1 F (37.3 C)] 98.3 F (36.8 C) (07/20 0349) Pulse Rate:  [86-110] 95 (07/20 0600) Resp:  [0-32] 21 (07/20 0600) BP: (92-153)/(27-93) 153/40 mmHg (07/20 0600) SpO2:  [95 %-100 %] 99 % (07/20 0600) FiO2 (%):  [28 %-30 %] 28 % (07/20 0400) Weight:  [126 lb 5.2 oz (57.3 kg)] 126 lb 5.2 oz (57.3 kg) (07/20 0453)  Physical Exam: General: Sleeping. NAD. On trach collar. Ears: Normal auricles and EACs. Nose: Normal mucosa. Oral: Normal mucosa. No lesion. Neck: Trach in place. No bleeding.  Abdomen: Soft, non tender. Skin: No rashes   Recent Labs  02/21/16 0510 02/22/16 0340  WBC 31.9* 19.7*  HGB 8.7* 8.2*  HCT 27.7* 26.7*  PLT 168 131*    Recent Labs  02/21/16 0510 02/22/16 0340  NA 138 138  K 4.5 3.6  CL 98* 102  CO2 21* 27  GLUCOSE 169* 120*  BUN 79* 49*  CREATININE 4.05* 2.74*  CALCIUM 8.2* 7.8*    Medications:  I have reviewed the patient's current medications. Scheduled: . antiseptic oral rinse  7 mL Mouth Rinse QID  . atorvastatin  80 mg Per Tube q1800  . ceFEPime (MAXIPIME) IV  2 g Intravenous Q M,W,F-1800  . chlorhexidine gluconate (SAGE KIT)  15 mL Mouth Rinse BID  . darbepoetin (ARANESP) injection - DIALYSIS  100 mcg Intravenous Q Wed-HD  . famotidine (PEPCID) IV  20 mg Intravenous Q24H  . feeding supplement (VITAL AF 1.2 CAL)  1,000 mL Per Tube Q24H  . insulin aspart  0-9 Units Subcutaneous Q4H  . methylPREDNISolone (SOLU-MEDROL) injection  40 mg Intravenous Daily  . metronidazole  500 mg Intravenous Q8H  . multivitamin  1 tablet Oral QHS  . sennosides  10 mL Oral Daily   QJJ:HERDEY chloride, sodium chloride, sodium chloride, alteplase, fentaNYL (SUBLIMAZE) injection, fentaNYL (SUBLIMAZE) injection, fentaNYL (SUBLIMAZE) injection, heparin, lidocaine (PF), lidocaine-prilocaine, metoprolol, midazolam, midazolam,  pentafluoroprop-tetrafluoroeth  Assessment/Plan: POD #2 s/p trach. Now off vent. Pt with pneumonia and respiratory failure. Fresh trach care. Will change trach early next week.   LOS: 6 days   Ramir Malerba,SUI W 02/22/2016, 7:31 AM

## 2016-02-22 NOTE — Progress Notes (Signed)
Thank you for consult on Tanner Campbell. Chart reviewed and note that patient with severe debility and unable to hold his head upright or  move BLE or BUE to commands. Would recommend return to [facility] for continued therapy as do not feel that he can tolerate intensive therapy at this time.  Will defer CIR consult.

## 2016-02-22 NOTE — Clinical Social Work Note (Signed)
Clinical Social Work Assessment  Patient Details  Name: Tanner Campbell MRN: [id number] Date of Birth: 1975/09/24  Date of referral:  02/22/16               Reason for consult:  Facility Placement                Permission sought to share information with:  Family Supports Permission granted to share information::  No (Patient trached and unable to follow commands)  Name::     Joesphine Bare (Sister) [phone]  Agency::     Relationship::     Contact Information:     Housing/Transportation Living arrangements for the past 2 months:  Post-Acute Facility, Single Family Home Source of Information:  Other (Comment Required), Medical Team (Sibling (Crystal Ciani)) Patient Interpreter Needed:  None Criminal Activity/Legal Involvement Pertinent to Current Situation/Hospitalization:  No - Comment as needed Significant Relationships:  Siblings Lives with:  Facility Resident, Siblings Do you feel safe going back to the place where you live?  Yes Need for family participation in patient care:  Yes (Comment)  Care giving concerns:  Patient's sister would like for Patient to be placed in a skilled nursing facility in [place] that accepts trach/diaylsis patients rather than returning to [facility]. )Patient's sister very upset regarding care received at [facility] and feels that "they didn't do anything. They just let him lay there".    Social Worker assessment / plan:  Patient is a 40 YO male who was initially admitted to [facility] from 6/21-6/29 for acute respiratory failure in the setting of volume overload. Patient transferred to [facility]) on 6/29 orally intubated for further evaluation and ventilator weaning. Patient is presently in ICU and has been weaned off of the vent on 28% trach collar. Prior to initial admission, Patient lived with his sister in [place], [place]. He was connected to [facility]. CSW engaged with Patient's sister who reports that she would  like for Patient to be placed in a skilled nursing facility in [place] that accepts trach/diaylsis patients rather than returning to [facility]. CSW to facilitate SNF search/placement process. CSW also to ensure Patient is stilled clipped with Dialysis center in [place].    Employment status:  Disabled (Comment on whether or not currently receiving Disability) Insurance information:  Medicare PT Recommendations:  Inpatient Rehab Consult Information / Referral to community resources:  Skilled Nursing Facility  Patient/Family's Response to care:  Patient's sister appreciative of care received at this time.   Patient/Family's Understanding of and Emotional Response to Diagnosis, Current Treatment, and Prognosis:  Patient's sister able to verbalize understanding of Patient's diagnosis, treatment, and prognosis. Patient's sister is eager to have her brother at facility that will provide the physical therapy and care that he needs to get better.   Emotional Assessment Appearance:  Appears stated age Attitude/Demeanor/Rapport:  Intubated (Following Commands or Not Following Commands) Affect (typically observed):  Unable to Assess Orientation:   (Unable to Assess) Alcohol / Substance use:  Not Applicable Psych involvement (Current and /or in the community):  No (Comment)  Discharge Needs  Concerns to be addressed:  Discharge Planning Concerns, Care Coordination Readmission within the last 30 days:  Yes Current discharge risk:  None Barriers to Discharge:  Waiting for outpatient dialysis, Continued Medical Work up   [employer], LCSW 02/22/2016, 2:48 PM

## 2016-02-22 NOTE — Progress Notes (Signed)
Patient ID: Tanner Campbell, male   DOB: [birth year], 40 y.o.   MRN: [id number] S: Tracheostomy collar in place. Patient is more alert today.    O:BP 144/56 mmHg  Pulse 101  Temp(Src) 98 F (36.7 C) (Oral)  Resp 18  Wt 126 lb 5.2 oz (57.3 kg)  SpO2 100%  Intake/Output Summary (Last 24 hours) at 02/22/16 1108 Last data filed at 02/22/16 0800  Gross per 24 hour  Intake    940 ml  Output   1000 ml  Net    -60 ml   Intake/Output: I/O last 3 completed shifts: In: 1527.7 [I.V.:360; NG/GT:667.7; IV Piggyback:500] Out: 1000 [Other:1000]  Intake/Output this shift:  Total I/O In: 30 [I.V.:10; NG/GT:20] Out: -  Weight change: -10.6 oz (-0.3 kg) General appearance: Tracheostomy collar in place; more alert and following commands  Resp: occassional rhonchi Cardio: systolic murmur: holosystolic 3/6, blowing throughout the precordium and friction rub heard throughout precordium. Friction rub is less pronounced on exam today.  GI: soft, non-tender; bowel sounds normal; no masses, no organomegaly Extremities: LUE AVF +T/B, no edema Dialysis Access: LUE AVF   Recent Labs Lab 02/11/2016 0510 02/17/16 0340  02/17/16 1723 02/18/16 0430 02/18/16 1700 02/19/16 0405 02/29/2016 0353 02/21/16 0510 02/22/16 0340  NA 129* 139  --   --  138  --  139 140 138 138  K 3.9 3.9  --   --  3.8  --  4.9 3.9 4.5 3.6  CL 90* 98*  --   --  100*  --  98* 99* 98* 102  CO2 24 29  --   --  28  --  29 27 21* 27  GLUCOSE 274* 180*  --   --  128*  --  98 90 169* 120*  BUN 87* 55*  --   --  78*  --  99* 45* 79* 49*  CREATININE 3.57* 2.67*  --   --  3.65*  --  4.43* 2.70* 4.05* 2.74*  ALBUMIN 1.5*  --   --   --  1.5*  --  1.8* 1.8* 1.8* 1.7*  CALCIUM 7.9* 8.1*  --   --  7.7*  --  7.8* 8.2* 8.2* 7.8*  PHOS 3.1 3.1  < > 4.2 4.4 5.3* 6.2* 5.0* 8.9* 4.1  < > = values in this interval not displayed. Liver Function Tests:  Recent Labs Lab 02/09/2016 0353 02/21/16 0510 02/22/16 0340  ALBUMIN 1.8* 1.8* 1.7*    CBC:  Recent Labs Lab 02/18/16 0430 02/19/16 0405 02/28/2016 0352 02/21/16 0510 02/22/16 0340  WBC 17.7* 18.2* 23.1* 31.9* 19.7*  HGB 8.2* 8.2* 8.5* 8.7* 8.2*  HCT 26.9* 27.1* 28.6* 27.7* 26.7*  MCV 88.5 88.6 89.1 87.1 87.5  PLT 174 167 170 168 131*   Cardiac Enzymes:  Recent Labs Lab 02/09/2016 2000 02/17/16 0005 02/17/16 0340  TROPONINI 0.22* 0.21* 0.22*   CBG:  Recent Labs Lab 02/21/16 1534 02/21/16 2002 02/21/16 2340 02/22/16 0351 02/22/16 0748  GLUCAP 174* 151* 137* 123* 138*    Iron Studies:   Recent Labs  02/15/2016 0352  IRON 15*  TIBC 151*  FERRITIN 2416*   Studies/Results: No results found. Marland Kitchen antiseptic oral rinse  7 mL Mouth Rinse QID  . atorvastatin  80 mg Per Tube q1800  . ceFEPime (MAXIPIME) IV  2 g Intravenous Q M,W,F-1800  . chlorhexidine gluconate (SAGE KIT)  15 mL Mouth Rinse BID  . darbepoetin (ARANESP) injection - DIALYSIS  100 mcg Intravenous Q Wed-HD  .  feeding supplement (VITAL AF 1.2 CAL)  1,000 mL Per Tube Q24H  . insulin aspart  0-9 Units Subcutaneous Q4H  . metroNIDAZOLE  500 mg Oral TID  . multivitamin  1 tablet Oral QHS    BMET    Component Value Date/Time   NA 138 02/22/2016 0340   K 3.6 02/22/2016 0340   CL 102 02/22/2016 0340   CO2 27 02/22/2016 0340   GLUCOSE 120* 02/22/2016 0340   BUN 49* 02/22/2016 0340   CREATININE 2.74* 02/22/2016 0340   CALCIUM 7.8* 02/22/2016 0340   CALCIUM 8.5* 02/03/2016 0600   GFRNONAA 27* 02/22/2016 0340   GFRAA 31* 02/22/2016 0340   CBC    Component Value Date/Time   WBC 19.7* 02/22/2016 0340   RBC 3.05* 02/22/2016 0340   HGB 8.2* 02/22/2016 0340   HCT 26.7* 02/22/2016 0340   PLT 131* 02/22/2016 0340   MCV 87.5 02/22/2016 0340   MCH 26.9 02/22/2016 0340   MCHC 30.7 02/22/2016 0340   RDW 16.5* 02/22/2016 0340   LYMPHSABS 0.6* 02/11/2016 0600   MONOABS 0.4 02/11/2016 0600   EOSABS 0.0 02/11/2016 0600   BASOSABS 0.0 02/11/2016 0600    Dialysis Orders: Center: [facility] on MWF .   Assessment/Plan: 1. VDRF - transferred from [facility] due to prolonged respiratory failure. PCCM managing- trach collar placed 7/18.  2. Heart murmer and friction rub - ECHO performed 02/03/2016 with worsened LV function and wall motion abnormality suggesting ischemia or infarct.Friction rub less pronounced on exam today.TEE to r/o endocarditis scheduled for today. Cardiology following; cardiac cath when stable.  3. ESRD - Continue with HD MWF via AVF. Does not appear volume overloaded. Dialysis tomorrow (7/21) with UF goal 1.5 L and higher K bath.  4. Hypertension/volume - stable- euvolemic on exam  5. Anemia - on Aranesp 100mcg. Iron studies showing low iron and low saturation ratio. However, will hold off repleting iron at this time in the setting of bacteremia. 6. Metabolic bone disease - Will follow ca/phos. Ca stable at present. Phosphorous is normal.  7. Nutrition - per primary svc 8. ID- vionella bacteremia- continue flagyl x 2 weeks as per infectious disease recommendation- TEE to rule out veg 9. Respiratory failure 2/2 possible PNA - continue Cefepime x 8 days as per ID recommendation  10. Global- Now with a trach it makes placement in an OP dialysis facility much more difficult. Plan will likely be to send him back to LTAC eventually.   Vasundhra Rathore  PGY 2 - IMTS  Patient seen and examined, agree with above note with above modifications. No real change- for TEE today to rule out vegetation. Routine HD tomorrow  , MD 02/22/2016        

## 2016-02-22 NOTE — NC FL2 (Signed)
[place] LEVEL OF CARE SCREENING TOOL     IDENTIFICATION  Patient Name: Tanner Campbell Birthdate: 17-Jan-1976 Sex: male Admission Date (Current Location): 02/09/2016  [facility] [place] Number:  Herbalist and Address:  [facility] [place] [facility], [place] [address], [place], [place] [address]      Provider Number: [id number]  Attending Physician Name and Address:  Collene Gobble, MD  Relative Name and Phone Number:  Emilio Aspen (sister) [phone]    Current Level of Care: Hospital Recommended Level of Care: [place] Prior Approval Number:    Date Approved/Denied:   PASRR Number: [id number]  Discharge Plan: SNF    Current Diagnoses: Patient Active Problem List   Diagnosis Date Noted  . Anaerobic bacteremia 02/19/2016  . Acute on chronic systolic CHF (congestive heart failure), NYHA class 3 ([place])   . NSTEMI (non-ST elevated myocardial infarction) ([place])   . Protein-calorie malnutrition, severe 02/17/2016  . Acute respiratory failure with hypoxia ([place])   . ESRD (end stage renal disease) ([place])   . PNA (pneumonia)   . Elevated troponin 02/12/2016  . CHF (congestive heart failure) ([place])   . Pleural effusion   . Pressure ulcer 01/31/2016  . S/P thoracentesis   . Bleeding from open wound of chest wall   . Acute on chronic renal failure ([place])   . Hypoxia 01/25/2016  . Dyspnea 01/25/2016  . Hyperkalemia 01/25/2016  . Acute on chronic respiratory failure ([place]) 01/25/2016  . Accelerated hypertension 12/22/2015  . Flash pulmonary edema ([place]) 12/22/2015  . Sepsis ([place]) 12/22/2015  . ESRD on dialysis ([place]) 12/22/2015  . Type 2 diabetes mellitus ([place]) 12/22/2015  . COPD (chronic obstructive pulmonary disease) ([place]) 12/22/2015    Orientation RESPIRATION BLADDER Height & Weight      (Unable to Assess )  Tracheostomy (shiley 39m cuffed 28%) Continent Weight: 126 lb 5.2 oz (57.3 kg) Height:     BEHAVIORAL SYMPTOMS/MOOD  NEUROLOGICAL BOWEL NUTRITION STATUS   (NONE)  (NONE) Continent NG/panda (nasogastric 14 Fr. Right nare)  AMBULATORY STATUS COMMUNICATION OF NEEDS Skin   Extensive Assist Non-Verbally PU Stage and Appropriate Care   PU Stage 2 Dressing:  (Foam Dressing PRN )                   Personal Care Assistance Level of Assistance  Bathing, Dressing, Feeding Bathing Assistance: Maximum assistance Feeding assistance: Limited assistance Dressing Assistance: Maximum assistance     Functional Limitations Info  Sight, Hearing, Speech Sight Info: Adequate Hearing Info: Adequate Speech Info: Impaired    SPECIAL CARE FACTORS FREQUENCY  PT (By licensed PT)     PT Frequency: MIN 3X/WEEK              Contractures Contractures Info: Not present    Additional Factors Info  Code Status, Allergies, Insulin Sliding Scale Code Status Info: FULL Allergies Info: Penicillins   Insulin Sliding Scale Info: Novolog 0-9 units every 4 hours       Current Medications (02/22/2016):  This is the current hospital active medication list Current Facility-Administered Medications  Medication Dose Route Frequency Provider Last Rate Last Dose  . 0.9 %  sodium chloride infusion  250 mL Intravenous PRN PErick Colace NP 10 mL/hr at 02/22/16 0700 250 mL at 02/22/16 0700  . 0.9 %  sodium chloride infusion   Intravenous Continuous PErick Colace NP 10 mL/hr at 02/22/16 0350    . 0.9 %  sodium chloride infusion  100 mL Intravenous PRN Joseph Coladonato, MD      . 0.9 %  sodium chloride infusion  100 mL Intravenous PRN Joseph Coladonato, MD      . alteplase (CATHFLO ACTIVASE) injection 2 mg  2 mg Intracatheter Once PRN Joseph Coladonato, MD      . antiseptic oral rinse solution (CORINZ)  7 mL Mouth Rinse QID Robert S Byrum, MD   7 mL at 02/22/16 1243  . atorvastatin (LIPITOR) tablet 80 mg  80 mg Per Tube q1800 Peter E Babcock, NP   80 mg at 02/21/16 1739  . ceFEPIme (MAXIPIME) 2 g in dextrose 5 % 50 mL IVPB  2  g Intravenous Q M,W,F-1800 Christina M Gambino, MD   2 g at 02/21/16 1839  . chlorhexidine gluconate (SAGE KIT) (PERIDEX) 0.12 % solution 15 mL  15 mL Mouth Rinse BID Robert S Byrum, MD   15 mL at 02/22/16 0758  . Darbepoetin Alfa (ARANESP) injection 100 mcg  100 mcg Intravenous Q Wed-HD Kellie Goldsborough, MD   100 mcg at 02/21/16 0845  . feeding supplement (VITAL AF 1.2 CAL) liquid 1,000 mL  1,000 mL Per Tube Q24H Katy Dodd Mayo, MD 20 mL/hr at 02/22/16 1420 1,000 mL at 02/22/16 1420  . fentaNYL (SUBLIMAZE) injection 100 mcg  100 mcg Intravenous Q15 min PRN Katy Dodd Mayo, MD   100 mcg at 02/19/16 1127  . fentaNYL (SUBLIMAZE) injection 100 mcg  100 mcg Intravenous Q2H PRN Katy Dodd Mayo, MD   100 mcg at 02/21/16 0540  . fentaNYL (SUBLIMAZE) injection 25-50 mcg  25-50 mcg Intravenous Q2H PRN Douglas B McQuaid, MD      . heparin injection 1,000 Units  1,000 Units Dialysis PRN Joseph Coladonato, MD      . insulin aspart (novoLOG) injection 0-9 Units  0-9 Units Subcutaneous Q4H Jose Angelo A de Dios, MD   2 Units at 02/22/16 1243  . lidocaine (PF) (XYLOCAINE) 1 % injection 5 mL  5 mL Intradermal PRN Joseph Coladonato, MD      . lidocaine-prilocaine (EMLA) cream 1 application  1 application Topical PRN Joseph Coladonato, MD      . metoprolol (LOPRESSOR) injection 5 mg  5 mg Intravenous QID PRN Katy Dodd Mayo, MD      . metroNIDAZOLE (FLAGYL) 50 mg/ml oral suspension 500 mg  500 mg Oral TID Christina M Gambino, MD   500 mg at 02/22/16 1007  . midazolam (VERSED) injection 2 mg  2 mg Intravenous Q2H PRN Katy Dodd Mayo, MD   2 mg at 02/21/16 0047  . multivitamin (RENA-VIT) tablet 1 tablet  1 tablet Oral QHS Robert S Byrum, MD   1 tablet at 02/21/16 2130  . pentafluoroprop-tetrafluoroeth (GEBAUERS) aerosol 1 application  1 application Topical PRN Joseph Coladonato, MD      . sennosides (SENOKOT) 8.8 MG/5ML syrup 10 mL  10 mL Oral QHS PRN Rakesh V Alva, MD         Discharge Medications: Please see  discharge summary for a list of discharge medications.  Relevant Imaging Results:  Relevant Lab Results:   Additional Information SS # [id number]; Patient is trached and will require Diaylsis M, W, F   N Gardner, LCSW     

## 2016-02-23 ENCOUNTER — Inpatient Hospital Stay (HOSPITAL_COMMUNITY): Payer: Medicare Other

## 2016-02-23 ENCOUNTER — Encounter (HOSPITAL_COMMUNITY): Payer: Self-pay

## 2016-02-23 ENCOUNTER — Inpatient Hospital Stay (HOSPITAL_COMMUNITY): Payer: Medicare Other | Admitting: Anesthesiology

## 2016-02-23 ENCOUNTER — Encounter (HOSPITAL_COMMUNITY): Admission: AD | Disposition: E | Payer: Self-pay | Attending: Internal Medicine

## 2016-02-23 DIAGNOSIS — Z930 Tracheostomy status: Secondary | ICD-10-CM | POA: Diagnosis present

## 2016-02-23 DIAGNOSIS — I351 Nonrheumatic aortic (valve) insufficiency: Secondary | ICD-10-CM

## 2016-02-23 HISTORY — PX: TEE WITHOUT CARDIOVERSION: SHX5443

## 2016-02-23 LAB — RENAL FUNCTION PANEL
ALBUMIN: 1.8 g/dL — AB (ref 3.5–5.0)
Anion gap: 16 — ABNORMAL HIGH (ref 5–15)
BUN: 68 mg/dL — AB (ref 6–20)
CHLORIDE: 101 mmol/L (ref 101–111)
CO2: 22 mmol/L (ref 22–32)
Calcium: 8.1 mg/dL — ABNORMAL LOW (ref 8.9–10.3)
Creatinine, Ser: 3.98 mg/dL — ABNORMAL HIGH (ref 0.61–1.24)
GFR, EST AFRICAN AMERICAN: 20 mL/min — AB (ref >60)
GFR, EST NON AFRICAN AMERICAN: 17 mL/min — AB (ref >60)
Glucose, Bld: 114 mg/dL — ABNORMAL HIGH (ref 65–99)
PHOSPHORUS: 4.2 mg/dL (ref 2.5–4.6)
POTASSIUM: 4 mmol/L (ref 3.5–5.1)
Sodium: 139 mmol/L (ref 135–145)

## 2016-02-23 LAB — CBC
HEMATOCRIT: 29.4 % — AB (ref 39.0–52.0)
HEMOGLOBIN: 9.1 g/dL — AB (ref 13.0–17.0)
MCH: 26.9 pg (ref 26.0–34.0)
MCHC: 31 g/dL (ref 30.0–36.0)
MCV: 87 fL (ref 78.0–100.0)
Platelets: 123 10*3/uL — ABNORMAL LOW (ref 150–400)
RBC: 3.38 MIL/uL — AB (ref 4.22–5.81)
RDW: 16.3 % — AB (ref 11.5–15.5)
WBC: 16.2 10*3/uL — AB (ref 4.0–10.5)

## 2016-02-23 LAB — GLUCOSE, CAPILLARY
GLUCOSE-CAPILLARY: 103 mg/dL — AB (ref 65–99)
GLUCOSE-CAPILLARY: 121 mg/dL — AB (ref 65–99)
GLUCOSE-CAPILLARY: 122 mg/dL — AB (ref 65–99)
GLUCOSE-CAPILLARY: 88 mg/dL (ref 65–99)
Glucose-Capillary: 88 mg/dL (ref 65–99)
Glucose-Capillary: 92 mg/dL (ref 65–99)
Glucose-Capillary: 98 mg/dL (ref 65–99)

## 2016-02-23 LAB — POCT I-STAT 3, ART BLOOD GAS (G3+)
ACID-BASE EXCESS: 5 mmol/L — AB (ref 0.0–2.0)
BICARBONATE: 28.8 meq/L — AB (ref 20.0–24.0)
O2 SAT: 99 %
PCO2 ART: 36.7 mmHg (ref 35.0–45.0)
PH ART: 7.503 — AB (ref 7.350–7.450)
PO2 ART: 151 mmHg — AB (ref 80.0–100.0)
Patient temperature: 99
TCO2: 30 mmol/L (ref 0–100)

## 2016-02-23 LAB — TROPONIN I: TROPONIN I: 0.27 ng/mL — AB (ref <0.03)

## 2016-02-23 SURGERY — ECHOCARDIOGRAM, TRANSESOPHAGEAL
Anesthesia: General

## 2016-02-23 MED ORDER — METRONIDAZOLE IN NACL 5-0.79 MG/ML-% IV SOLN
500.0000 mg | Freq: Once | INTRAVENOUS | Status: AC
  Administered 2016-02-23: 500 mg via INTRAVENOUS
  Filled 2016-02-23: qty 100

## 2016-02-23 MED ORDER — METOPROLOL TARTRATE 5 MG/5ML IV SOLN
2.5000 mg | Freq: Four times a day (QID) | INTRAVENOUS | Status: DC
  Administered 2016-02-23 – 2016-02-24 (×3): 2.5 mg via INTRAVENOUS
  Filled 2016-02-23 (×3): qty 5

## 2016-02-23 MED ORDER — NITROGLYCERIN 0.4 MG SL SUBL
0.4000 mg | SUBLINGUAL_TABLET | SUBLINGUAL | Status: DC | PRN
  Administered 2016-02-23 – 2016-03-22 (×10): 0.4 mg via SUBLINGUAL
  Filled 2016-02-23 (×6): qty 1

## 2016-02-23 MED ORDER — HEPARIN (PORCINE) IN NACL 100-0.45 UNIT/ML-% IJ SOLN
1350.0000 [IU]/h | INTRAMUSCULAR | Status: DC
  Administered 2016-02-23: 750 [IU]/h via INTRAVENOUS
  Administered 2016-02-24: 1100 [IU]/h via INTRAVENOUS
  Administered 2016-02-25 – 2016-02-26 (×2): 1350 [IU]/h via INTRAVENOUS
  Filled 2016-02-23 (×4): qty 250

## 2016-02-23 MED ORDER — PANTOPRAZOLE SODIUM 40 MG PO PACK
40.0000 mg | PACK | Freq: Every day | ORAL | Status: DC
  Administered 2016-02-24 – 2016-03-04 (×7): 40 mg
  Filled 2016-02-23 (×8): qty 20

## 2016-02-23 MED ORDER — NITROGLYCERIN IN D5W 200-5 MCG/ML-% IV SOLN: 0.0000 ug/min | INTRAVENOUS | Status: DC

## 2016-02-23 MED ORDER — ASPIRIN 81 MG PO CHEW
81.0000 mg | CHEWABLE_TABLET | Freq: Every day | ORAL | Status: DC
  Administered 2016-02-24: 81 mg via ORAL
  Filled 2016-02-23: qty 1

## 2016-02-23 MED ORDER — PROPOFOL 10 MG/ML IV BOLUS
INTRAVENOUS | Status: DC | PRN
  Administered 2016-02-23: 50 mg via INTRAVENOUS

## 2016-02-23 MED ORDER — HEPARIN BOLUS VIA INFUSION
3000.0000 [IU] | Freq: Once | INTRAVENOUS | Status: AC
  Administered 2016-02-23: 3000 [IU] via INTRAVENOUS
  Filled 2016-02-23: qty 3000

## 2016-02-23 MED ORDER — VITAL AF 1.2 CAL PO LIQD
1000.0000 mL | ORAL | Status: DC
  Administered 2016-02-24 – 2016-02-28 (×5): 1000 mL
  Filled 2016-02-23 (×8): qty 1000

## 2016-02-23 MED ORDER — PROPOFOL 500 MG/50ML IV EMUL
INTRAVENOUS | Status: AC
  Filled 2016-02-23: qty 100

## 2016-02-23 MED ORDER — NITROGLYCERIN 0.4 MG SL SUBL
SUBLINGUAL_TABLET | SUBLINGUAL | Status: AC
  Filled 2016-02-23: qty 1

## 2016-02-23 MED ORDER — SODIUM CHLORIDE 0.9 % IV SOLN
INTRAVENOUS | Status: DC | PRN
  Administered 2016-02-23: 14:00:00 via INTRAVENOUS

## 2016-02-23 NOTE — Progress Notes (Signed)
Physical Therapy Treatment Patient Details Name: Tanner Campbell MRN: [id number] DOB: [birth year] Today's Date: 03/03/2016    History of Present Illness Tanner Campbell is an 40 y.o. male with PMH of DM II, HTN, HLD, ESRD on HD (T, Th, S), COPD and prior back surgery who initially was admitted to [facility] from 6/21 - 6/29 for acute respiratory failure in the setting of volume overload. He was intubated 6/23-7/5 (self extubated), 7/10-7/16 (self extubated), now reintubated. Initially, sputum was positive for H influenza and subsequently for MSSA and Escherichia coli and blood cultures growing a gram-negative anaerobic organism. Thoracentesis on right showed borderline exudate by LDH criteria. Plan for trach today 7/18 at 5:30 pm.      PT Comments    Pt admitted with above diagnosis. Pt currently with functional limitations due to balance and endurance deficits as well as poor strength.  Pt was able to sit in full chair position with feet on floor 5 min.  Limited today as sats dropped to 84-87% and would not get better until assisted pt back to lying down. Will continue acute PT as pt tolerates.   Pt will benefit from skilled PT to increase their independence and safety with mobility to allow discharge to the venue listed below.    Follow Up Recommendations  CIR;Supervision/Assistance - 24 hour     Equipment Recommendations  Other (comment) (TBA)    Recommendations for Other Services       Precautions / Restrictions Precautions Precautions: Fall Restrictions Weight Bearing Restrictions: No    Mobility  Bed Mobility Overal bed mobility: Needs Assistance;+2 for physical assistance Bed Mobility: Supine to Sit;Rolling Rolling: Min assist   Supine to sit: Max assist;+2 for physical assistance     General bed mobility comments: Progressively inclined bed into full chair position.  Pt needed max assist to lean forward to get back off of bed.  Almost immediately, pt desat to 84%-88%.  Tried to  lift pts head to neutral neck position as he has it flexed.  This kept sats to 88-89%but sats would not get better and waveform of sat good.  Pt had sat up for 5 min and decided to lay pt back down.  Once in supine, sats 90-92%.  Pt started coughing and did get a large amount of sputum up.  When trying to position pt, noted that he had BM.  Called nursing who came in and assisted PT with cleaning pt and changing linens as well as sliding pt up in bed.  Pt left in 40 degree chair position with pilllows in place with sats 95%.  Transfers                    Ambulation/Gait                 Stairs            Wheelchair Mobility    Modified Rankin (Stroke Patients Only)       Balance Overall balance assessment: Needs assistance;History of Falls Sitting-balance support: Bilateral upper extremity supported;Feet supported Sitting balance-Leahy Scale: Poor Sitting balance - Comments: Pt needed mod to min assist to sit EOB for 5 min.  HAd trouble breathing therefore did not sit too long.  Sats on 28% trach collar 84-87%.  Recovered once pt was laid backto bed.                             Cognition Arousal/Alertness:  Awake/alert Behavior During Therapy: Flat affect;Restless Overall Cognitive Status: Difficult to assess                      Exercises General Exercises - Lower Extremity Ankle Circles/Pumps: AROM;Left;5 reps;Supine Long Arc Quad: PROM;Both;5 reps;Seated    General Comments        Pertinent Vitals/Pain Pain Assessment: No/denies pain  Sats 84-87% in sitting with 28% trach collar.  95% on departure positioned well in bed.     Home Living                      Prior Function            PT Goals (current goals can now be found in the care plan section) Progress towards PT goals: Progressing toward goals    Frequency  Min 3X/week    PT Plan Current plan remains appropriate    Co-evaluation             End of  Session Equipment Utilized During Treatment: Gait belt;Oxygen (trach) Activity Tolerance: Patient limited by fatigue;Patient limited by lethargy Patient left: in bed;with call bell/phone within reach;with bed alarm set;with restraints reapplied;with SCD's reapplied     Time: 9390-3009 PT Time Calculation (min) (ACUTE ONLY): 25 min  Charges:  $Therapeutic Exercise: 8-22 mins $Therapeutic Activity: 8-22 mins                    G CodesTawni Millers Campbell 02-24-2016, 2:11 PM Tanner Campbell,PT Acute Rehabilitation [phone] (pager)

## 2016-02-23 NOTE — Progress Notes (Signed)
PULMONARY / CRITICAL CARE MEDICINE   Name: Tanner Campbell MRN: [id number] DOB: [birth year]    ADMISSION DATE:  02/27/2016  REFERRING MD:  Sharyon Medicus   CHIEF COMPLAINT:  Prolonged critical illness, failure to wean, recurrent sepsis   HISTORY OF PRESENT ILLNESS:   40 y/o M with PMH of DM II, HTN, HLD, ESRD on HD (T, Th, S), COPD and prior back surgery who initially was admitted to [facility] from 6/21 - 6/29 for acute respiratory failure in the setting of volume overload. He was admitted by the hospitalist service for further evaluation. He was treated with BiPAP on admit. Unfortunately, he decompensated 6/23 and required intubation. The patient was found to have H. Influenza positive sputum and treated with rocephin. CXR demonstrated a large right pleural effusion and underwent a thoracentesis (1L serous fluid removed, likely exudative by protein). He had difficulty with weaning and was transferred to [facility] on 6/29 orally intubated for further evaluation / ventilator weaning. The patient developed fever and antibiotics were expanded to vancomycin, cefepime and diflucan. He had progressed to weaning on PSV x 4 hours as of 7/3. He self extubated on 7/5. Since that time has required BiPAP off and on. After HD 7/10 his mental status worsened and he became minimally responsive. CXR consistent with RLL opacification and he was re-intubated. He was re-cultured at this point, and Also was found to have increased Troponin for which cardiology was consulted. He was placed on LMWH and apparently had GIB while on this and his hgb dropped as low as 8.4 from 9.3. As of 7/14 he remains ventilator dependent. His sputum cultures are growing MSSA and e coli and also has positive blood cultures. Because of all of his overwhelming issues it was felt best to transfer him to a higher level of care.   SUBJECTIVE:  No events overnight. Did well on trach collar.  VITAL SIGNS: BP 119/77 mmHg  Pulse 105  Temp(Src) 98 F (36.7  C) (Oral)  Resp 23  Wt 55.7 kg (122 lb 12.7 oz)  SpO2 98%  VENTILATOR SETTINGS: Vent Mode:  [-]  FiO2 (%):  [28 %] 28 %  INTAKE / OUTPUT: I/O last 3 completed shifts: In: 1156.7 [I.V.:360; NG/GT:696.7; IV Piggyback:100] Out: 0   PHYSICAL EXAMINATION: General: Awake, alert, in NAD Neuro: Follows commands HEENT: Trach in place Cardiac: regular, friction rub present Chest: normal work of breathing, pleural rubs auscultated, diminished breath sounds in the lung bases Abd: soft, non tender Ext: no edema, decreased muscle bulk Skin: multiple areas of healing excoriations on the lower extremities  LABS:  BMET  Recent Labs Lab 02/21/16 0510 02/22/16 0340 02/27/2016 0519  NA 138 138 139  K 4.5 3.6 4.0  CL 98* 102 101  CO2 21* 27 22  BUN 79* 49* 68*  CREATININE 4.05* 2.74* 3.98*  GLUCOSE 169* 120* 114*    Electrolytes  Recent Labs Lab 02/17/16 1723 02/18/16 0430 02/18/16 1700  02/21/16 0510 02/22/16 0340 02/10/2016 0519  CALCIUM  --  7.7*  --   < > 8.2* 7.8* 8.1*  MG 2.1 2.1 2.1  --   --   --   --   PHOS 4.2 4.4 5.3*  < > 8.9* 4.1 4.2  < > = values in this interval not displayed.  CBC  Recent Labs Lab 02/21/16 0510 02/22/16 0340 02/15/2016 0519  WBC 31.9* 19.7* 16.2*  HGB 8.7* 8.2* 9.1*  HCT 27.7* 26.7* 29.4*  PLT 168 131* 123*    Coag's  Recent Labs Lab 03/02/2016 2000 03/04/2016 0352  APTT 33  --   INR 1.15 1.13    Sepsis Markers  Recent Labs Lab 02/04/2016 2000 02/17/16 0340 02/18/16 0430  PROCALCITON 11.92 11.54 7.55    ABG  Recent Labs Lab 02/17/2016 1840 02/17/16 0434 02/19/16 1609  PHART 7.511* 7.489* 7.409  PCO2ART 38.5 37.8 46.8*  PO2ART 94.0 96.0 67.0*    Liver Enzymes  Recent Labs Lab 02/21/16 0510 02/22/16 0340 03/20/16 0519  ALBUMIN 1.8* 1.7* 1.8*    Cardiac Enzymes  Recent Labs Lab 02/28/2016 2000 02/17/16 0005 02/17/16 0340  TROPONINI 0.22* 0.21* 0.22*    Glucose  Recent Labs Lab 02/22/16 1537  02/22/16 1956 02/22/16 2339 20-Mar-2016 0355 03/20/16 0806 03/20/16 1150  GLUCAP 113* 147* 122* 121* 88 92    Imaging No results found.   STUDIES:  7/13 Echo >> EF 40 to 45%, mod LVH, small pericardial effusion 7/17 CXR >> stable bilateral pleural effusions, stable patchy opacities in the lung bases- pneumonia vs atelectasis ANA neg  CULTURES: BCX2 7/10: Veillonella species (Gram negative coccobacilli) resp culture 7/10: Moderate SA and E.coli (both pan sensitive)  ANTIBIOTICS: cipro 7/12>>>7/14 vanc 7/2>>>7/17 Cefepime 7/14>>>7/21 Fluconazole 7/7>>>7/17 Flagyl 7/17 >>  SIGNIFICANT EVENTS: 7/14 Transfer from [facility] to [facility] 7/18 Trach placed  LINES/TUBES: OETT 7/10>>>7/17; 7/17 >>> Right IJ PICC (? Insertion date)>>> Trach 7/18   DISCUSSION: 40 yo male was at [facility] with PNA, VDRF and failure to wean.  Transferred to [facility] for rehab and weaning.  Developed bacteremia with HCAP in [facility] and recurrent VDRF.  Developed NSTEMI and new murmur.  Transferred to [facility] for further management. Self-extubated and re-intubated multiple times. S/p trach 7/18, doing well on trach collar.   ASSESSMENT / PLAN:  PULMONARY A: Acute hypoxic respiratory failure - s/p trach 7/18 ?HCAP Bilateral pleural effusions w/ recent right effusion s/p thoracentesis P:   S/p Trach, off vent Continue Trach collar  CARDIOVASCULAR A:  Recent NSTEMI vs demand ischemia. Acute systolic CHF Small pericardial effusion with friction rub- infectious vs uremia? P:  Cardiology consulted, appreciate assistance. May need to consider cardiac cath when medical issues improve. Bedside TEE today. Metoprolol IV 5mg  prn  RENAL A:   ESRD HD dependent  P:   Nephrology consulted, appreciate assistance. HD MWF. Replace electrolytes as needed  GASTROINTESTINAL A:   Recent GIB on LMWH P:   Trend cbc  PPI via NGT Tube feeds  HEMATOLOGIC A:   Anemia of chronic disease- stable P:  Trend CBC SCDs Aranesp per  Nephro  INFECTIOUS A:   Bacteremia with Veillonella species ?HCAP P:   ID consulted, appreciate assistance. Continue Flagyl x 2 weeks for bacteremia.  Pt will get last day of Cefepime today to complete 8 day course.  ENDOCRINE A:   DM type II w/ hyperglycemia  Secondary hyperparathyroidism  Steroid dependence per previous notes- unclear why? P:   Sensitive SSI  NEUROLOGIC A:   Acute Encephalopathy in setting of critical illness- resolved.  P:   RASS goal: 0  DISPO PT recommending CIR CIR feels that he is more appropriate for LTACH Sister does not want him to go back to [facility] because she didn't feel he was being treated well May be a candidate for SNF? Although would be difficult with trach/HD. Apparently there is a facility in [place] that may be an option.   Willadean Carol, MD Family Medicine, PGY-2

## 2016-02-23 NOTE — Progress Notes (Signed)
Pt tolerated Cortrak tube placement well. Tube was placed in right nare at 93 cm. Xray order was placed per protocol. Tube was added to assessment. Please call the Cortrak team with any questions or concerns.

## 2016-02-23 NOTE — Progress Notes (Signed)
  Echocardiogram Echocardiogram Transesophageal has been performed.  Tanner Campbell 02/17/2016, 3:34 PM

## 2016-02-23 NOTE — Progress Notes (Signed)
RT placed patient on venturi set-up with trach collar for transport with an O2 tank.

## 2016-02-23 NOTE — Transfer of Care (Signed)
Immediate Anesthesia Transfer of Care Note  Patient: Tanner Campbell  Procedure(s) Performed: Procedure(s): TRANSESOPHAGEAL ECHOCARDIOGRAM (TEE) (N/A)  Patient Location: PACU and Endoscopy Unit  Anesthesia Type:General  Level of Consciousness: awake, oriented, sedated and patient cooperative  Airway & Oxygen Therapy: Patient Spontanous Breathing and Patient connected to tracheostomy mask oxygen  Post-op Assessment: Report given to RN, Post -op Vital signs reviewed and stable and Patient moving all extremities  Post vital signs: Reviewed and stable  Last Vitals:  Filed Vitals:   02/03/2016 1326 02/14/2016 1330  BP: 130/29   Pulse: 101   Temp:  36.7 C  Resp: 30     Last Pain:  Filed Vitals:   02/21/2016 1356  PainSc: Asleep         Complications: No apparent anesthesia complications

## 2016-02-23 NOTE — Op Note (Signed)
INDICATIONS: infective endocarditis  PROCEDURE:   Informed consent was obtained prior to the procedure. The risks, benefits and alternatives for the procedure were discussed and the patient comprehended these risks.  Risks include, but are not limited to, cough, sore throat, vomiting, nausea, somnolence, esophageal and stomach trauma or perforation, bleeding, low blood pressure, aspiration, pneumonia, infection, trauma to the teeth and death.    After a procedural time-out, the oropharynx was anesthetized with 20% benzocaine spray.   During this procedure the patient received deep sedation per anesthesia (tracheostomy).  The patient's heart rate, blood pressure, and oxygen saturationweare monitored continuously during the procedure.   The transesophageal probe was inserted in the esophagus and stomach without difficulty and multiple views were obtained.  The patient was kept under observation until the patient left the procedure room.  The patient left the procedure room in stable condition.   Agitated microbubble saline contrast was not administered.  COMPLICATIONS:    There were no immediate complications.  FINDINGS:  No vegetations are seen. Mild degenerative changes of the aortic valve and mild-moderate aortic insufficiency, but no vegetations or perivalvular abscess seen.  RECOMMENDATIONS:     If febrile illness/bacteremia persists, consider repeat TEE in 2 weeks.  Time Spent Directly with the Patient:  30 minutes   Tanner Campbell Feb 29, 2016, 2:38 PM

## 2016-02-23 NOTE — Progress Notes (Signed)
Cuff deflated for Speech Therapy trial on PMV.

## 2016-02-23 NOTE — Anesthesia Procedure Notes (Signed)
Date/Time: 02/25/2016 2:20 PM Performed by: Coralee Rud Patient Re-evaluated:Patient Re-evaluated prior to inductionOxygen Delivery Method: Circle system utilized Preoxygenation: Pre-oxygenation with 100% oxygen Intubation Type: Tracheostomy Placement Confirmation: positive ETCO2

## 2016-02-23 NOTE — Progress Notes (Signed)
Called to see patient for acute change in status. Noted by nurse to have increased work of breathing. HR increased to 120 sinus tachy. He was initially hypertensive but after Ntg became hypotensive with BP down to 70. He was placed back on ventilator from trach collar and responded fairly quickly to ventilation with improved sats, BP, and HR. Initial Ecg showed ? STEMI but on my review showed mostly lateral ischemia. Repeat Ecg shows NSR with rate 110. ST-T depression c/w inferolateral ischemia. No ST elevation. CXR shows no change in bilateral effusions/atx. On exam there is no change in his limited responsiveness but alert. He has no JVD.  Lungs with coarse BS and loud bilateral pleural rubs. CV is without murmur or gallop. Prior friction rub improved.  I suspect this is predominantly pulmonary related with prompt improvement when placed back on vent. Consider mucus plugging or PE. This could be related to acute coronary ischemia but I don't see evidence of STEMI. We will cycle cardiac enzymes and Ecg. Start IV heparin. IV lopressor since he is currently NPO. Continue ASA- may need to give PR if unable to give po. Continue vent support.  We will continue to follow.  CRITICAL CARE Performed by: Evi Mccomb [place]   Total critical care time: 30 minutes  Critical care time was exclusive of separately billable procedures and treating other patients.  Critical care was necessary to treat or prevent imminent or life-threatening deterioration.  Critical care was time spent personally by me on the following activities: development of treatment plan with patient and/or surrogate as well as nursing, discussions with consultants, evaluation of patient's response to treatment, examination of patient, obtaining history from patient or surrogate, ordering and performing treatments and interventions, ordering and review of laboratory studies, ordering and review of radiographic studies, pulse oximetry and  re-evaluation of patient's condition.  Urias Sheek [place] MD, [facility]  02/06/2016 6:32 PM

## 2016-02-23 NOTE — Procedures (Signed)
Patient was seen on dialysis and the procedure was supervised.  BFR 400  Via AVF BP is  112/54.   Patient appears to be tolerating treatment well  Tanner Campbell A 02/21/2016

## 2016-02-23 NOTE — Progress Notes (Signed)
eLink Physician-Brief Progress Note Patient Name: Tanner Campbell DOB: [birth year] MRN: [id number]   Date of Service  02/03/2016  HPI/Events of Note  Ventilated patient. Notified of need for stress ulcer prophylaxis.  eICU Interventions  Will order Protonix liquid 40 mg via tube now and Q day.      Intervention Category Intermediate Interventions: Best-practice therapies (e.g. DVT, beta blocker, etc.)  [name] 02/07/2016, 7:49 PM

## 2016-02-23 NOTE — Anesthesia Preprocedure Evaluation (Addendum)
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 on medications and Pt. on home beta blockers + Past MI and +CHF   Rhythm:Irregular Rate:Abnormal  7/13 Echo >> EF 40 to 45%, mod LVH, small pericardial effusion 7/17 CXR >> stable bilateral pleural effusions, stable patchy opacities in the lung bases- pneumonia vs atelectasis ANA neg   Neuro/Psych Neuropathy  negative neurological ROS  negative psych ROS   GI/Hepatic   Endo/Other  diabetes, Type 2, Oral Hypoglycemic Agents  Renal/GU ESRF and DialysisRenal disease     Musculoskeletal   Abdominal   Peds  Hematology  (+) Blood dyscrasia (thrombocytopenia), anemia ,   Anesthesia Other Findings   Reproductive/Obstetrics                          Anesthesia Physical Anesthesia Plan  ASA: IV  Anesthesia Plan: General   Post-op Pain Management:    Induction: Inhalational  Airway Management Planned: Tracheostomy  Additional Equipment:   Intra-op Plan:   Post-operative Plan:   Informed Consent: I have reviewed the patients History and Physical, chart, labs and discussed the procedure including the risks, benefits and alternatives for the proposed anesthesia with the patient or authorized representative who has indicated his/her understanding and acceptance.   Dental advisory given  Plan Discussed with: CRNA  Anesthesia Plan Comments:  (Risks/benefits of general anesthesia discussed with patient including risk of damage to teeth, lips, gum, and tongue, nausea/vomiting, allergic reactions to medications, and the possibility of heart attack, stroke and death.  All patient/patient representative questions answered.  Patient/patient representative wishes to proceed. )        Anesthesia Quick Evaluation

## 2016-02-23 NOTE — Progress Notes (Addendum)
Notified MD Arsenio Loader in regards to patient having an increase in work of breathing, diaphoresis, no chest pain according to patient around 1730. Patient respiratory rate was increased 30s-40s, increase use of respiratory accessory muscles, and sinus tachy 140s-150s. Patient had been previously on trach collar, had Fannie Knee RT to place him back on the vent. Also, an EKG was obtained. Cardiology notified. Will monitor and assess and carry out orders from MDs.

## 2016-02-23 NOTE — Progress Notes (Signed)
Patient's symptoms have decreased. Will continue to monitor and assess

## 2016-02-23 NOTE — Progress Notes (Signed)
eLink Physician-Brief Progress Note Patient Name: Tanner Campbell DOB: 07-10-1976 MRN: [id number]   Date of Service  24-Feb-2016  HPI/Events of Note  Acute onset of respiratory distress with RR in 40's patient diaphoretic. EKG c/w inferior STEMI. Patient is already followed by Cardiology.  eICU Interventions  Will order: 1. NTG 0.4 mg SL PRN. 2. NTG IV infusion. Titrate to relief of chest pain and improved SOB. 3. ASA 81 mg PO now and Q day. 4. Metoprolol 2.5 mg IV now and Q 6 hours. 5. Heparin IV infusion per pharmacy. 6. Cycle Troponin. 7. Place back on mechanical ventilation 50%/PRVC 15/TV 500/P 5. 8. ABG at 7 PM. 9. Will ask bedside nurse to notify Cardiology of events and EKG findings of STEMI so that they can consider him for emergent Cath.  10. Portable CXR STAT.     Intervention Category Major Interventions: Respiratory failure - evaluation and management Intermediate Interventions: Respiratory distress - evaluation and management  Sommer,Steven Eugene 02/24/16, 5:51 PM

## 2016-02-23 NOTE — Progress Notes (Signed)
Subjective: No trach issues. On trach collar.  Objective: Vital signs in last 24 hours: Temp:  [98 F (36.7 C)-98.9 F (37.2 C)] 98.9 F (37.2 C) (07/21 0351) Pulse Rate:  [96-116] 100 (07/21 0705) Resp:  [7-33] 28 (07/21 0705) BP: (96-156)/(40-99) 111/53 mmHg (07/21 0705) SpO2:  [93 %-100 %] 100 % (07/21 0705) FiO2 (%):  [28 %] 28 % (07/21 0442) Weight:  [125 lb (56.7 kg)] 125 lb (56.7 kg) (07/21 0700)  Physical Exam: General: Sleeping. NAD. On trach collar. Ears: Normal auricles and EACs. Nose: Normal mucosa. Oral: Normal mucosa. No lesion. Neck: Trach in place. No bleeding.  Abdomen: Soft, non tender. Skin: No rashes   Recent Labs  02/22/16 0340 02/29/2016 0519  WBC 19.7* 16.2*  HGB 8.2* 9.1*  HCT 26.7* 29.4*  PLT 131* 123*    Recent Labs  02/22/16 0340 02/17/2016 0519  NA 138 139  K 3.6 4.0  CL 102 101  CO2 27 22  GLUCOSE 120* 114*  BUN 49* 68*  CREATININE 2.74* 3.98*  CALCIUM 7.8* 8.1*    Medications:  I have reviewed the patient's current medications. Scheduled: . antiseptic oral rinse  7 mL Mouth Rinse QID  . atorvastatin  80 mg Per Tube q1800  . ceFEPime (MAXIPIME) IV  2 g Intravenous Q M,W,F-1800  . chlorhexidine gluconate (SAGE KIT)  15 mL Mouth Rinse BID  . darbepoetin (ARANESP) injection - DIALYSIS  100 mcg Intravenous Q Wed-HD  . feeding supplement (VITAL AF 1.2 CAL)  1,000 mL Per Tube Q24H  . insulin aspart  0-9 Units Subcutaneous Q4H  . metroNIDAZOLE  500 mg Oral TID  . multivitamin  1 tablet Oral QHS   MBW:GYKZLD chloride, sodium chloride, sodium chloride, alteplase, fentaNYL (SUBLIMAZE) injection, fentaNYL (SUBLIMAZE) injection, fentaNYL (SUBLIMAZE) injection, heparin, lidocaine (PF), lidocaine-prilocaine, metoprolol, midazolam, pentafluoroprop-tetrafluoroeth, sennosides  Assessment/Plan: POD #3 s/p trach. Off vent. Pt with pneumonia and respiratory failure. Fresh trach care. Will change trach early next week.   LOS: 7 days    Tanner Campbell,SUI W 02/04/2016, 7:19 AM

## 2016-02-23 NOTE — Progress Notes (Signed)
Patient Name: Liston Thum Date of Encounter: 02/09/2016   Primary Cardiologist: new (Dr. Debara Pickett) Pt. Profile: Mr. Dennin is 40 yo male with PMH of HTN, HLD, DM, Multiple sclerosis, COPD and ESRD on HD since 05/02/2014 presented with acute resp distress, failed multiple extubation attempt. Acute respiratory failure felt to be due to a combination of Haemophilus pneumonia and volume overload. Patient has missed at least 3 episodes of hemodialysis before presentation, nephrology managing hemodialysis.Found to have mildly elevated trop on 7/7-7/8.   Cardiology consulted 7/10 for elevated troponin and EKG changes. An echocardiogram 02/07/16 a mildly reduced LVEF of 45-50% however there is akinesis of the mid inferior and inferoseptal myocardium, mild aortic insufficiency and a small pericardial effusion. A repeat EKG 7/13 showed worsening deep T-wave inversions in the antero-lateral leads with high voltage as well as inferior T-wave inversions. Advice to avoid ASA in setting of acute GI bleed.   Echo done 02/15/16 showed slightly worse LV function with focal inferior wall motion abnormality, which could suggest severe ischemia or infarct in this territory. There is a small pericardial effusion which is stable. Resumed ASA.   In term, his sputum cultures are growing MSSA and e coli and also has positive blood cultures with Veillonella The patient was noted to have a new friction rub and ? New Murmur   Peak of troponin was 0.50 02/12/16. Now trending down.   Patient now with trach in place  SUBJECTIVE Patient is arousable and a little more alert but not conversant. Trach collar in place. Being dialysed currently.   CURRENT MEDS . antiseptic oral rinse  7 mL Mouth Rinse QID  . atorvastatin  80 mg Per Tube q1800  . ceFEPime (MAXIPIME) IV  2 g Intravenous Q M,W,F-1800  . chlorhexidine gluconate (SAGE KIT)  15 mL Mouth Rinse BID  . darbepoetin (ARANESP) injection - DIALYSIS  100 mcg Intravenous Q  Wed-HD  . feeding supplement (VITAL AF 1.2 CAL)  1,000 mL Per Tube Q24H  . insulin aspart  0-9 Units Subcutaneous Q4H  . metroNIDAZOLE  500 mg Oral TID  . metronidazole  500 mg Intravenous Once  . multivitamin  1 tablet Oral QHS    OBJECTIVE  Filed Vitals:   02/12/2016 0945 02/26/2016 1000 02/13/2016 1015 02/24/2016 1030  BP: 99/66 111/53 134/86 114/53  Pulse: 99 96 100 104  Temp:      TempSrc:      Resp: _0 Weight:      SpO2: 100% 100% 100% 100%    Intake/Output Summary (Last 24 hours) at 02/28/2016 1039 Last data filed at 02/13/2016 0900  Gross per 24 hour  Intake 586.66 ml  Output      0 ml  Net 586.66 ml   Filed Weights   02/22/16 0453 03/01/2016 0500 02/29/2016 0700  Weight: 126 lb 5.2 oz (57.3 kg) 125 lb (56.7 kg) 125 lb (56.7 kg)    PHYSICAL EXAM  General: Pleasant, thin WM in NAD.  Neuro:  Opens eyes but does not really respond. Blank stare. HEENT:  Normal  Neck: Supple without bruits or JVD. Lungs:  Coarse breath sounds. Heart: RRR no murmur. He has a coarse predominantly systolic sound in the upper sternal area.  Abdomen: Soft, non-tender, non-distended, BS + x 4.  Extremities: No clubbing, cyanosis or edema. DP/PT/Radials 2+ and equal bilaterally.  Accessory Clinical Findings  CBC  Recent Labs  02/22/16 0340 02/10/2016 0519  WBC 19.7* 16.2*  HGB 8.2* 9.1*  HCT  26.7* 29.4*  MCV 87.5 87.0  PLT 131* 790*   Basic Metabolic Panel  Recent Labs  02/22/16 0340 02/15/2016 0519  NA 138 139  K 3.6 4.0  CL 102 101  CO2 27 22  GLUCOSE 120* 114*  BUN 49* 68*  CREATININE 2.74* 3.98*  CALCIUM 7.8* 8.1*  PHOS 4.1 4.2   Liver Function Tests  Recent Labs  02/22/16 0340 02/26/2016 0519  ALBUMIN 1.7* 1.8*   No results for input(s): LIPASE, AMYLASE in the last 72 hours. Cardiac Enzymes No results for input(s): CKTOTAL, CKMB, CKMBINDEX, TROPONINI in the last 72 hours.  Radiology/Studies  Ct Chest Wo Contrast  02/13/2016  CLINICAL DATA:  Pneumonia.  History of diabetes and hypertension. Hemodialysis patient. EXAM: CT CHEST WITHOUT CONTRAST TECHNIQUE: Multidetector CT imaging of the chest was performed following the standard protocol without IV contrast. COMPARISON:  None. FINDINGS: Cardiovascular: Cardiac enlargement. Coronary artery calcifications. Small pericardial effusion. Normal caliber thoracic aorta. Central venous catheter with tip at the cavoatrial junction. Mediastinum/Nodes: Mildly prominent lymph nodes throughout the mediastinum. Largest pretracheal nodes measuring about 15 mm diameter. The etiology is nonspecific but probably reactive. Esophagus is decompressed with an enteric tube in place. Lungs/Pleura: Patchy nodular airspace infiltrates demonstrated throughout both lungs. This could represent multifocal pneumonia, edema, or ARDS. Linear atelectasis in both lung bases. Endotracheal tube is in place. Small amount of secretions in the dependent trachea. Small bilateral pleural effusions slightly greater on the right. Upper Abdomen: Surgical absence of the gallbladder. Calcified granulomas in spleen. Vascular calcifications. Enteric tube tip is in stomach. Musculoskeletal: Mild degenerative changes in the spine. No destructive bone lesions. IMPRESSION: Cardiac enlargement with small pericardial effusion. Small bilateral pleural effusions. Patchy nodular airspace disease throughout both lungs may indicate edema or pneumonia. Findings suggest congestive failure. Electronically Signed   By: Lucienne Capers M.D.   On: 03/04/2016 21:41   Dg Chest Port 1 View  02/18/2016  CLINICAL DATA:  Respiratory failure EXAM: PORTABLE CHEST 1 VIEW COMPARISON:  CT chest 03/02/2016.  One-view chest x-ray 02/25/2016. FINDINGS: Endotracheal tube is stable. The NG tube courses off the inferior border the film. A right IJ line is stable. Bilateral pleural effusions and basilar airspace disease is unchanged. Mild edema is stable. IMPRESSION: 1. Stable bilateral pleural  effusions and airspace disease. 2. Support apparatus is stable. Electronically Signed   ByHarrell Gave    TTE: 02/15/2016 - Left ventricle: The cavity size was normal. There was moderate  concentric hypertrophy. Systolic function was mildly to  moderately reduced. The estimated ejection fraction was in the  range of 40% to 45%. Diffuse hypokinesis. There is akinesis of  the inferoseptal myocardium. - Aortic valve: There was trivial regurgitation. - Mitral valve: Moderately calcified annulus. - Pericardium, extracardiac: A small pericardial effusion was  identified along the left ventricular free wall. There was no  evidence of hemodynamic compromise.  Impressions:  - Compared to the prior study on 02/07/16, there has been no significant  interval change.   ASSESSMENT AND PLAN Principal Problem:   Acute respiratory failure with hypoxia (HCC) Active Problems:   Sepsis ([place])   ESRD (end stage renal disease) (HCC)   Protein-calorie malnutrition, severe   Acute on chronic systolic CHF (congestive heart failure), NYHA class 3 (HCC)   NSTEMI (non-ST elevated myocardial infarction) (HCC)   Anaerobic bacteremia    1. Veillonella sepsis/bacteremia. On antibiotics per ID.   2. Acute respiratory failure. Now with trach in place.  Oxygenating well. Combined volume overload and  PNA.   3. Elevated troponin. Peak 0.5. Probably related to demand ischemia. 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 Ecg shows inferolateral ischemia that has not changed considerably. May need to consider ischemic evaluation with cardiac cath once concomitant medical issues improved. Currently his medical status has not advanced to the point where invasive evaluation is appropriate.  4. Pericardial friction rub/ ? murmur.  Both Echo on 7/13 and CT on 7/14 show a small pericardial effusion. No evidence of tamponade. No clear symptoms.  Plan  for bedside TEE  today at 3 pm to clarify if new murmur in setting of bacteremia.  Repeat blood cultures are negative to date.  5. ESRD on dialysis. Management of volume status per Renal.   6. Recent GI bleed.   7. Acute encephalitis in setting of critical illness.   8. Anemia  Arie Sabina [facility] 02/27/2016

## 2016-02-23 NOTE — Progress Notes (Signed)
Patient ID: Tanner Campbell, male   DOB: [birth year], 40 y.o.   MRN: [id number] S: Tracheostomy collar in place. Patient is more alert today; watching TV. Undergoing dialysis this morning.    O:BP 102/69 mmHg  Pulse 103  Temp(Src) 98.7 F (37.1 C) (Oral)  Resp 24  Wt 125 lb (56.7 kg)  SpO2 100%  Intake/Output Summary (Last 24 hours) at 02/27/2016 1117 Last data filed at 02/15/2016 0900  Gross per 24 hour  Intake 553.33 ml  Output      0 ml  Net 553.33 ml   Intake/Output: I/O last 3 completed shifts: In: 1156.7 [I.V.:360; NG/GT:696.7; IV Piggyback:100] Out: 0   Intake/Output this shift:  Total I/O In: 20 [I.V.:20] Out: -  Weight change: -1 lb 5.2 oz (-0.6 kg) General appearance: Tracheostomy collar in place; more alert and following commands  Resp: occassional rhonchi Cardio: systolic murmur: holosystolic 3/6, blowing throughout the precordium and friction rub heard throughout precordium. Friction rub is less pronounced now.  GI: soft, non-tender; bowel sounds normal; no masses, no organomegaly Extremities: LUE AVF +T/B, no edema Dialysis Access: LUE AVF   Recent Labs Lab 02/17/16 0340  02/18/16 0430 02/18/16 1700 02/19/16 0405 02/13/2016 0353 02/21/16 0510 02/22/16 0340 02/17/2016 0519  NA 139  --  138  --  139 140 138 138 139  K 3.9  --  3.8  --  4.9 3.9 4.5 3.6 4.0  CL 98*  --  100*  --  98* 99* 98* 102 101  CO2 29  --  28  --  29 27 21* 27 22  GLUCOSE 180*  --  128*  --  98 90 169* 120* 114*  BUN 55*  --  78*  --  99* 45* 79* 49* 68*  CREATININE 2.67*  --  3.65*  --  4.43* 2.70* 4.05* 2.74* 3.98*  ALBUMIN  --   --  1.5*  --  1.8* 1.8* 1.8* 1.7* 1.8*  CALCIUM 8.1*  --  7.7*  --  7.8* 8.2* 8.2* 7.8* 8.1*  PHOS 3.1  < > 4.4 5.3* 6.2* 5.0* 8.9* 4.1 4.2  < > = values in this interval not displayed. Liver Function Tests:  Recent Labs Lab 02/21/16 0510 02/22/16 0340 02/12/2016 0519  ALBUMIN 1.8* 1.7* 1.8*   CBC:  Recent Labs Lab 02/19/16 0405 02/15/2016 0352  02/21/16 0510 02/22/16 0340 03/04/2016 0519  WBC 18.2* 23.1* 31.9* 19.7* 16.2*  HGB 8.2* 8.5* 8.7* 8.2* 9.1*  HCT 27.1* 28.6* 27.7* 26.7* 29.4*  MCV 88.6 89.1 87.1 87.5 87.0  PLT 167 170 168 131* 123*   Cardiac Enzymes:  Recent Labs Lab 02/08/2016 2000 02/17/16 0005 02/17/16 0340  TROPONINI 0.22* 0.21* 0.22*   CBG:  Recent Labs Lab 02/22/16 1537 02/22/16 1956 02/22/16 2339 02/03/2016 0355 03/04/2016 0806  GLUCAP 113* 147* 122* 121* 88    Iron Studies:  No results for input(s): IRON, TIBC, TRANSFERRIN, FERRITIN in the last 72 hours. Studies/Results: No results found. Marland Kitchen antiseptic oral rinse  7 mL Mouth Rinse QID  . atorvastatin  80 mg Per Tube q1800  . ceFEPime (MAXIPIME) IV  2 g Intravenous Q M,W,F-1800  . chlorhexidine gluconate (SAGE KIT)  15 mL Mouth Rinse BID  . darbepoetin (ARANESP) injection - DIALYSIS  100 mcg Intravenous Q Wed-HD  . feeding supplement (VITAL AF 1.2 CAL)  1,000 mL Per Tube Q24H  . insulin aspart  0-9 Units Subcutaneous Q4H  . metroNIDAZOLE  500 mg Oral TID  . metronidazole  500 mg Intravenous Once  . multivitamin  1 tablet Oral QHS    BMET    Component Value Date/Time   NA 139 03/02/2016 0519   K 4.0 02/15/2016 0519   CL 101 02/19/2016 0519   CO2 22 02/10/2016 0519   GLUCOSE 114* 03/01/2016 0519   BUN 68* 02/03/2016 0519   CREATININE 3.98* 02/07/2016 0519   CALCIUM 8.1* 02/21/2016 0519   CALCIUM 8.5* 02/03/2016 0600   GFRNONAA 17* 02/24/2016 0519   GFRAA 20* 02/25/2016 0519   CBC    Component Value Date/Time   WBC 16.2* 02/28/2016 0519   RBC 3.38* 02/15/2016 0519   HGB 9.1* 03/01/2016 0519   HCT 29.4* 03/04/2016 0519   PLT 123* 02/21/2016 0519   MCV 87.0 02/11/2016 0519   MCH 26.9 02/04/2016 0519   MCHC 31.0 02/11/2016 0519   RDW 16.3* 03/03/2016 0519   LYMPHSABS 0.6* 02/11/2016 0600   MONOABS 0.4 02/11/2016 0600   EOSABS 0.0 02/11/2016 0600   BASOSABS 0.0 02/11/2016 0600    Dialysis Orders: Center: [facility] on  MWF .   Assessment/Plan: 1. VDRF - transferred from [facility] due to prolonged respiratory failure. PCCM managing- trach collar placed 7/18.  2. Heart murmer and friction rub - ECHO performed 02/06/2016 with worsened LV function and wall motion abnormality suggesting ischemia or infarct.Friction rub less pronounced on exam today.TEE to r/o vegetations re-scheduled for today. Cardiology following; cardiac cath when stable ?  3. ESRD - Continue with HD MWF via AVF. Dialysis this morning - UF goal 1500, BFR 400 via AVF. BP 112/54.    -F/u am renal function panel  4. Hypertension/volume - stable- euvolemic on exam  5. Anemia - on Aranesp 140mg. Iron studies showing low iron and low saturation ratio. However, will hold off repleting iron at this time in the setting of bacteremia.  -F/u am CBC 6. Metabolic bone disease - Will follow ca/phos. Ca stable at present. Phosphorous is normal. PTH 107- no binder or vit D 7. Nutrition - per primary svc 8. ID- vionella bacteremia- continue flagyl x 2 weeks as per infectious disease recommendation- TEE to rule out veg 9. Respiratory failure 2/2 possible PNA - continue Cefepime x 8 days as per ID recommendation  10. Global- Now with a trach it makes placement in an OP dialysis facility much more difficult. Plan will likely be to send him back to LTAC eventually.   VShela Leff PGY 2 - IMTS  Patient seen and examined, agree with above note with above modifications. On HD this AM- seems depressed given situation and I do not blame him.  Given that he has trach- placement in an OP dialysis facility will be difficult if not impossible so [facility] for anemia related to CKD KCorliss Parish MD 02/11/2016

## 2016-02-23 NOTE — Progress Notes (Signed)
CRITICAL VALUE ALERT  Critical value received:  Troponin 0.27  Date of notification:  02/03/2016  Time of notification:  0720  Critical value read back:Yes.    Nurse who received alert:  Telford Nab RN  MD notified (1st page):  Dr. Andria Meuse  Time of first page:  0740  MD notified (2nd page):  Time of second page:  Responding MD:  Dr. Viviann Spare   Time MD responded:  [phone]

## 2016-02-23 NOTE — Evaluation (Signed)
Passy-Muir Speaking Valve - Evaluation Patient Details  Name: Tanner Campbell MRN: [id number] Date of Birth: 17-Oct-1975  Today's Date: 02/08/2016 Time: 0905-1006 SLP Time Calculation (min) (ACUTE ONLY): 61 min  Past Medical History:  Past Medical History  Diagnosis Date  . Diabetes mellitus without complication (HCC)   . Hypertension   . COPD (chronic obstructive pulmonary disease) (HCC)   . Renal disorder   . Neuropathy (HCC)   . Hemodialysis patient (HCC)   . High cholesterol    Past Surgical History:  Past Surgical History  Procedure Laterality Date  . Cholecystectomy    . Back surgery    . Neck surgery    . Tracheostomy tube placement N/A 02/24/2016    Procedure: TRACHEOSTOMY;  Surgeon: Newman Pies, MD;  Location: [facility] OR;  Service: ENT;  Laterality: N/A;   HPI:  40 year old male admitted February 26, 2016 due to recurrent sepsis, failure to wean, multiple intubations. PMH significant forDM2, HTN, HLD, ESRD on HD, COPD. Trach placed 02/22/2016. Orders received for PMSV evaluation and BSE. Will hold BSE at this time, as pt is currently NPO for TEE later today.   Assessment / Plan / Recommendation Clinical Impression  Respiratory therapy present to suction patient and deflate cuff. Pt unable to follow commands consistently, but was responsive to a few yes/no questions. Pt unable to acheive vocalization, but exhibits strong cough. Pt sats remained steady throughout PMSV trial.  RN informed of eval results and recommendations. After 30 minutes, valve was removed per pt request, and attached to tether on trach ties. Cuff remains deflated. Signs posted in pt room regarding critical importance of deflating cuff prior to valve placement. Encouraged pt/RN to wear/place PMSV as often and for as long as pt allows during waking hours to facilitate effective communication attempts, normalize subglottic pressure for improved swallow function and safety, and to strengthen respiratory efforts.   Unable to complete  BSE at this time, as pt is NPO for TEE. Will return next date to evaluate readiness for po intake/objective study.    SLP Assessment  Patient needs continued Speech Lanaguage Pathology Services    Follow Up Recommendations   (TBD)    Frequency and Duration min 3x week  2 weeks    PMSV Trial PMSV was placed for: 30 minutes Able to redirect subglottic air through upper airway: Yes Able to Attain Phonation: No Able to Expectorate Secretions: No attempts Level of Secretion Expectoration with PMSV: Not observed Breath Support for Phonation: Adequate Intelligibility: Not tested Respirations During Trial: 25 SpO2 During Trial: 99 % Pulse During Trial: 98 Behavior: Alert;No attempt to communicate;Controlled   Tracheostomy Tube    Shiley #6, cuffed   Vent Dependency  FiO2 (%): 28 %    Cuff Deflation Trial   Tolerated Cuff Deflation: Yes Length of Time for Cuff Deflation Trial: 10 minutes Behavior: Alert;Quiet        Leigh Aurora 03/02/2016, 10:07 AM  Merlene Laughter B. Murvin Natal [facility], CCC-SLP [phone]

## 2016-02-23 NOTE — Progress Notes (Signed)
PT Cancellation Note  Patient Details Name: Tanner Campbell MRN: [id number] DOB: 1975-12-17   Cancelled Treatment:    Reason Eval/Treat Not Completed: Medical issues which prohibited therapy (Pt on HD.  Plan for TEE to leave at 1pm. Plan to see late am if dialysis is finished in time.)   [name] F 02/03/2016, 9:40 AM  [facility] [phone] (pager)

## 2016-02-23 NOTE — Progress Notes (Signed)
Patient transported back to 2M07. RT placed patient back on ATC 5 L, 28%.

## 2016-02-23 NOTE — Progress Notes (Addendum)
ANTICOAGULATION CONSULT NOTE Pharmacy Consult for heparin Indication: chest pain/ACS  Allergies  Allergen Reactions  . Penicillins Other (See Comments)    Reaction: Unknown    Patient Measurements: Weight: 122 lb 12.7 oz (55.7 kg) Heparin Dosing Weight: 55.7 kg  Vital Signs: Temp: 99.3 F (37.4 C) (07/21 1526) Temp Source: Oral (07/21 1526) BP: 147/58 mmHg (07/21 1625) Pulse Rate: 111 (07/21 1625)  Labs:  Recent Labs  02/21/16 0510 02/22/16 0340 03/01/2016 0519  HGB 8.7* 8.2* 9.1*  HCT 27.7* 26.7* 29.4*  PLT 168 131* 123*  CREATININE 4.05* 2.74* 3.98*    Estimated Creatinine Clearance: 19.2 mL/min (by C-G formula based on Cr of 3.98).   Medical History: Past Medical History  Diagnosis Date  . Diabetes mellitus without complication (HCC)   . Hypertension   . COPD (chronic obstructive pulmonary disease) (HCC)   . Renal disorder   . Neuropathy (HCC)   . Hemodialysis patient (HCC)   . High cholesterol     Medications:  See EMR  Assessment: 40 yo male with ESRD on HD amongst other chronic comorbidities. Initiating heparin gtt for rule out ACS. H/h 9.1/29, plts down from baseline, 123. Has not been on any chemical VTE px.   Goal of Therapy:  Heparin level 0.3-0.7 units/ml Monitor platelets by anticoagulation protocol: Yes    Plan:  -Heparin 3000 unit bolus then 750 units/hr -Daily HL, CBC -First level with AM labs   Jazmon Kos, Darl Householder 02/08/2016,5:51 PM

## 2016-02-24 ENCOUNTER — Encounter (HOSPITAL_COMMUNITY): Payer: Self-pay | Admitting: Cardiovascular Disease

## 2016-02-24 ENCOUNTER — Inpatient Hospital Stay (HOSPITAL_COMMUNITY): Payer: Medicare Other

## 2016-02-24 LAB — HEPARIN LEVEL (UNFRACTIONATED)
Heparin Unfractionated: <0.1 IU/mL — ABNORMAL LOW (ref 0.30–0.70)
Heparin Unfractionated: <0.1 IU/mL — ABNORMAL LOW (ref 0.30–0.70)

## 2016-02-24 LAB — CBC
HCT: 26.4 % — ABNORMAL LOW (ref 39.0–52.0)
HEMOGLOBIN: 8.2 g/dL — AB (ref 13.0–17.0)
MCH: 27.3 pg (ref 26.0–34.0)
MCHC: 31.1 g/dL (ref 30.0–36.0)
MCV: 88 fL (ref 78.0–100.0)
PLATELETS: 93 10*3/uL — AB (ref 150–400)
RBC: 3 MIL/uL — AB (ref 4.22–5.81)
RDW: 16.6 % — ABNORMAL HIGH (ref 11.5–15.5)
WBC: 13.5 10*3/uL — AB (ref 4.0–10.5)

## 2016-02-24 LAB — GLUCOSE, CAPILLARY
GLUCOSE-CAPILLARY: 110 mg/dL — AB (ref 65–99)
GLUCOSE-CAPILLARY: 165 mg/dL — AB (ref 65–99)
GLUCOSE-CAPILLARY: 212 mg/dL — AB (ref 65–99)
Glucose-Capillary: 91 mg/dL (ref 65–99)
Glucose-Capillary: 87 mg/dL (ref 65–99)
Glucose-Capillary: 172 mg/dL — ABNORMAL HIGH (ref 65–99)

## 2016-02-24 LAB — RENAL FUNCTION PANEL
Albumin: 1.7 g/dL — ABNORMAL LOW (ref 3.5–5.0)
Anion gap: 11 (ref 5–15)
BUN: 26 mg/dL — ABNORMAL HIGH (ref 6–20)
CALCIUM: 7.7 mg/dL — AB (ref 8.9–10.3)
CO2: 25 mmol/L (ref 22–32)
Chloride: 102 mmol/L (ref 101–111)
Creatinine, Ser: 2.42 mg/dL — ABNORMAL HIGH (ref 0.61–1.24)
GFR calc Af Amer: 37 mL/min — ABNORMAL LOW (ref >60)
GFR calc non Af Amer: 32 mL/min — ABNORMAL LOW (ref >60)
Glucose, Bld: 86 mg/dL (ref 65–99)
PHOSPHORUS: 3.2 mg/dL (ref 2.5–4.6)
Potassium: 4 mmol/L (ref 3.5–5.1)
Sodium: 138 mmol/L (ref 135–145)

## 2016-02-24 LAB — TROPONIN I
Troponin I: 0.32 ng/mL (ref <0.03)
Troponin I: 0.33 ng/mL (ref <0.03)

## 2016-02-24 MED ORDER — METRONIDAZOLE 50 MG/ML ORAL SUSPENSION
500.0000 mg | Freq: Three times a day (TID) | ORAL | Status: AC
  Administered 2016-02-24 – 2016-03-04 (×20): 500 mg
  Filled 2016-02-24 (×32): qty 10

## 2016-02-24 MED ORDER — METOPROLOL TARTRATE 5 MG/5ML IV SOLN
5.0000 mg | Freq: Four times a day (QID) | INTRAVENOUS | Status: DC
  Administered 2016-02-24 – 2016-02-26 (×8): 5 mg via INTRAVENOUS
  Filled 2016-02-24 (×7): qty 5

## 2016-02-24 MED ORDER — ASPIRIN 81 MG PO CHEW
81.0000 mg | CHEWABLE_TABLET | Freq: Every day | ORAL | Status: DC
  Administered 2016-02-25 – 2016-03-04 (×6): 81 mg
  Filled 2016-02-24 (×5): qty 1

## 2016-02-24 NOTE — Progress Notes (Signed)
Primary cardiologist: Dr. Lyman Bishop  Seen for followup: Elevated troponin I  Subjective:    Patient on 40% ATC at this time, no distress. Awake, does not communicate verbally.  Objective:   Temp:  [98 F (36.7 C)-99.6 F (37.6 C)] 99.6 F (37.6 C) (07/22 0754) Pulse Rate:  [62-141] 90 (07/22 0950) Resp:  [6-38] 13 (07/22 0950) BP: (73-171)/(19-80) 151/76 mmHg (07/22 0900) SpO2:  [89 %-100 %] 100 % (07/22 0900) FiO2 (%):  [28 %-50 %] 40 % (07/22 1000) Weight:  [120 lb 13 oz (54.8 kg)] 120 lb 13 oz (54.8 kg) (07/22 0500) Last BM Date: 02/24/16  Filed Weights   02/10/2016 0700 02/25/2016 1056 02/24/16 0500  Weight: 125 lb (56.7 kg) 122 lb 12.7 oz (55.7 kg) 120 lb 13 oz (54.8 kg)    Intake/Output Summary (Last 24 hours) at 02/24/16 1124 Last data filed at 02/24/16 1048  Gross per 24 hour  Intake  664.7 ml  Output      0 ml  Net  664.7 ml    Telemetry: Sinus rhythm and sinus tachycardia.  Exam:  General: Chronically ill-appearing male in no distress.  HEENT: 40% ATC in place.  Lungs: Diminished breath sounds.  Cardiac: RRR with distant heart sounds, no gallop. 2/6 systolic murmur.  Abdomen: NABS.  Lab Results:  Basic Metabolic Panel:  Recent Labs Lab 02/17/16 1723 02/18/16 0430 02/18/16 1700  02/22/16 0340 02/21/2016 0519 02/24/16 0514  NA  --  138  --   < > 138 139 138  K  --  3.8  --   < > 3.6 4.0 4.0  CL  --  100*  --   < > 102 101 102  CO2  --  28  --   < > 27 22 25   GLUCOSE  --  128*  --   < > 120* 114* 86  BUN  --  78*  --   < > 49* 68* 26*  CREATININE  --  3.65*  --   < > 2.74* 3.98* 2.42*  CALCIUM  --  7.7*  --   < > 7.8* 8.1* 7.7*  MG 2.1 2.1 2.1  --   --   --   --   < > = values in this interval not displayed.  Liver Function Tests:  Recent Labs Lab 02/22/16 0340 02/25/2016 0519 02/24/16 0514  ALBUMIN 1.7* 1.8* 1.7*    CBC:  Recent Labs Lab 02/22/16 0340 02/22/2016 0519 02/24/16 0514  WBC 19.7* 16.2* 13.5*  HGB 8.2* 9.1*  8.2*  HCT 26.7* 29.4* 26.4*  MCV 87.5 87.0 88.0  PLT 131* 123* 93*    Cardiac Enzymes:  Recent Labs Lab 02/15/2016 1832 02/24/16 0005 02/24/16 0514  TROPONINI 0.27* 0.32* 0.33*    Echocardiogram 03/04/2016: Study Conclusions  - Left ventricle: The cavity size was normal. Wall thickness was  normal. Systolic function was normal. The estimated ejection  fraction was in the range of 55% to 60%. Diffuse hypokinesis. - Aortic valve: No evidence of vegetation. There was mild to  moderate regurgitation directed centrally in the LVOT. - Mitral valve: No evidence of vegetation. - Left atrium: No evidence of thrombus in the atrial cavity or  appendage. No evidence of thrombus in the appendage. - Right atrium: No evidence of thrombus in the atrial cavity or  appendage. - Atrial septum: No defect or patent foramen ovale was identified. - Tricuspid valve: No evidence of vegetation. - Pulmonic valve: No evidence of  vegetation.  Impressions:  - No evidence of endocarditis. There was no evidence of a  vegetation. Aortic insufficiency is seen, with a central jet. It  is likely due to degenerative changes. Nevertheless, it is  reasonable to repeat the evaluation of the aortic valve in 1-2  weeks if bacteremia and/or febrile illness persist despite  treatment.   Medications:   Scheduled Medications: . antiseptic oral rinse  7 mL Mouth Rinse QID  . aspirin  81 mg Oral Daily  . atorvastatin  80 mg Per Tube q1800  . chlorhexidine gluconate (SAGE KIT)  15 mL Mouth Rinse BID  . darbepoetin (ARANESP) injection - DIALYSIS  100 mcg Intravenous Q Wed-HD  . feeding supplement (VITAL AF 1.2 CAL)  1,000 mL Per Tube Q24H  . insulin aspart  0-9 Units Subcutaneous Q4H  . metoprolol  2.5 mg Intravenous Q6H  . metroNIDAZOLE  500 mg Oral TID  . multivitamin  1 tablet Oral QHS  . pantoprazole sodium  40 mg Per Tube Daily     Infusions: . sodium chloride 10 mL/hr at 02/22/16 0350  .  heparin 950 Units/hr (02/24/16 0800)  . nitroGLYCERIN       PRN Medications:  sodium chloride, sodium chloride, sodium chloride, alteplase, fentaNYL (SUBLIMAZE) injection, fentaNYL (SUBLIMAZE) injection, fentaNYL (SUBLIMAZE) injection, heparin, lidocaine (PF), lidocaine-prilocaine, metoprolol, midazolam, nitroGLYCERIN, pentafluoroprop-tetrafluoroeth, sennosides   Assessment:   1. Elevated troponin I, suspect demand ischemia at this point with relatively low-level elevations. ECG with ischemic changes in the setting of respiratory distress and tachycardia. LVEF described in the range of 55-60% by most recent assessment by TEE on July 21.  2. Mild to moderate aortic regurgitation.  3. Gram-negative bacteremia, on antibiotics per CCM team. No vegetations noted by TEE.  4. Pericardial rub, small pericardial effusion by echocardiogram on July 13. At that time LVEF reported at 40-45% with inferoseptal hypokinesis.  5. ESRD on hemodialysis, followed by Nephrology.  6. Recent GI bleed.  7. Acute encephalopathy.   Plan/Discussion:    Reviewed recent cardiology notes plan. Patient currently on aspirin, Lipitor, metoprolol, and heparin. Will increase dose of IV Lopressor for now. No clear indication for urgent cardiac catheterization at this time, procedure can be reconsidered as the patient further stabilizes with other comorbid illnesses that remain active.   Tanner Campbell, M.D., F.A.C.C.

## 2016-02-24 NOTE — Progress Notes (Signed)
ANTICOAGULATION CONSULT NOTE Pharmacy Consult for heparin Indication: chest pain/ACS  Allergies  Allergen Reactions  . Penicillins Other (See Comments)    Reaction: Unknown    Patient Measurements: Height: 5\' 5"  (165.1 cm) Weight: 120 lb 13 oz (54.8 kg) IBW/kg (Calculated) : 61.5 Heparin Dosing Weight: 55.7 kg  Vital Signs: Temp: 98.8 F (37.1 C) (07/22 1203) Temp Source: Oral (07/22 1203) BP: 161/77 mmHg (07/22 1400) Pulse Rate: 103 (07/22 1400)  Labs:  Recent Labs  02/22/16 0340 02/16/2016 0519 02/29/2016 1832 02/24/16 0005 02/24/16 0514 02/24/16 1344  HGB 8.2* 9.1*  --   --  8.2*  --   HCT 26.7* 29.4*  --   --  26.4*  --   PLT 131* 123*  --   --  93*  --   HEPARINUNFRC  --   --   --   --  <0.10* <0.10*  CREATININE 2.74* 3.98*  --   --  2.42*  --   TROPONINI  --   --  0.27* 0.32* 0.33*  --     Estimated Creatinine Clearance: 31.1 mL/min (by C-G formula based on Cr of 2.42).  Assessment: 40 yo male with EKG changes and possible ACS. Pharmacy consulted to dose heparin for chest pain/ACS.   HL again <0.10 (subtherapeutic). Hgb 8.2, Plt trending down 93. Spoke with cards and planning to continue at least through night. Will be cautious and not bolus due to history of recent GI bleed. Per RN, no issues with the line and no current s/sx of bleeding noted.  Goal of Therapy:  Heparin level 0.3-0.7 units/ml Monitor platelets by anticoagulation protocol: Yes  Plan:  Increase Heparin to 1100 units/hr Check heparin level in 8 hours Monitor daily HL, CBC and s/sx of bleeding  Casilda Carls, PharmD. PGY-2 Pharmacy Resident Pager: [phone] 02/24/2016,3:29 PM

## 2016-02-24 NOTE — Progress Notes (Signed)
Trach care done, site was cleansed with NS, dried and dressing applied. No complications noted.

## 2016-02-24 NOTE — Progress Notes (Signed)
RT placed patient on 40% ATC. No complications. Vital signs are stable at this time. RT will continue to monitor.

## 2016-02-24 NOTE — Progress Notes (Signed)
Cortrak team contacted due to tube not  Flushing. Was coiled in stomach. Retracted 30 cm. Flushed. Xray ordered by RN to confirm gastric placement.   Christophe Louis RD, LDN, CNSC Clinical Nutrition Pager: [phone] 02/24/2016 9:33 AM

## 2016-02-24 NOTE — Progress Notes (Signed)
[place] TEAM 1 - Stepdown/ICU TEAM  Tanner Campbell  JKK:[id number] DOB: 1976/01/03 DOA: 02/22/2016 PCP: No primary care provider on file.    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  He decompensated 6/23 and required intubation. The patient was found to have H. Influenza positive sputum and treated with Rocephin. CXR demonstrated a large right pleural effusion and he underwent a thoracentesis (1L serous fluid removed - exudative by protein). He had difficulty with weaning and was transferred to [facility] on 6/29 orally intubated for ventilator weaning. The patient developed fever and antibiotics were expanded to vancomycin, cefepime and diflucan. He had progressed to weaning on PSV x 4 hours as of 7/3. He self extubated on 7/5. Since that time he required BiPAP off and on. After HD 7/10 his mental status worsened and he became minimally responsive. CXR was consistent with RLL opacification and he was re-intubated. He was re-cultured and also found to have increased Troponin for which Cardiology was consulted. He was placed on LMWH, but had a GIB while on this w/ his hgb dropping as low as 8.4.  On 7/14 he remained ventilator dependent. His sputum cultures grew MSSA and E coli and he was noted to have + blood cultures. Because of these acutely worsening issues it was felt best to transfer him to the ICU at [facility].   Significant Events: 7/14 Transfer from [facility] to [facility] 7/18 Trach placed  Subjective: The patient's eyes are open and he appears to be looking around at times but he does not respond to the examiner.  He cannot provide a review of systems.  He does not follow simple commands.  Assessment & Plan:  Acute hypoxic respiratory failure - s/p trach 7/18 - HCAP Ventilator management and trach care per PCCM - has completed  a course of cefepime  Bacteremia with Veillonella species continue Flagyl x 2 weeks for bacteremia  Bilateral pleural effusions s/p thoracentesis Stable on follow-up chest x-ray 7/21  NSTEMI vs demand ischemia Cardiology suggest he will need an eventual cardiac cath - on IV heparin for now   Small pericardial effusion with friction rub Cardiology following - A in a negative - TEE without evidence of endocarditis/vegetations   ESRD on HD Nephrology following  Recent GIB on LMWH No evidence of acute blood loss today  Anemia of chronic kidney disease + acute blood loss  Hemoglobin presently stable at 8-9 - need to follow trend  DM type II w/ hyperglycemia  CBG currently well-controlled  Acute Encephalopathy in setting of critical illness  Severe malnutrition in context of acute illness Continue supportive tube feeding when tube feed repositioned  Disposition Planning  PT recommended CIR - CIR felt he is more appropriate for LTACH - sister does not want him to go back to [facility] because she didn't feel he was being treated well - SNF?   DVT prophylaxis: IV heparin  Code Status: FULL CODE Family Communication: no family present at time of exam  Disposition Plan:   Consultants:  PCCM Cardiology  ID Nephrology   Antimicrobials:  Cipro 7/12>7/14 Vanc 7/2>7/17 Cefepime 7/14>7/21 Fluconazole 7/7>7/17 Flagyl 7/17 >  Objective: Blood pressure 139/72, pulse 87, temperature 99.6 F (37.6 C), temperature source Oral, resp. rate 19, height 5' 5"  (1.651 m), weight 54.8 kg (120 lb  13 oz), SpO2 100 %.  Intake/Output Summary (Last 24 hours) at 02/24/16 0853 Last data filed at 02/24/16 0700  Gross per 24 hour  Intake  626.2 ml  Output   1500 ml  Net -873.8 ml   Filed Weights   02/08/2016 0700 02/12/2016 1056 02/24/16 0500  Weight: 56.7 kg (125 lb) 55.7 kg (122 lb 12.7 oz) 54.8 kg (120 lb 13 oz)    Examination: General: No acute respiratory distress on full vent support  via trach Lungs: Poor air movement bilateral bases - no wheeze Cardiovascular: Regular rate and rhythm with prominent rub Abdomen: Nondistended, soft, bowel sounds positive, no rebound, no ascites, no appreciable mass Extremities: No significant cyanosis, clubbing, or edema bilateral lower extremities  CBC:  Recent Labs Lab 02/11/2016 0352 02/21/16 0510 02/22/16 0340 03/04/2016 0519 02/24/16 0514  WBC 23.1* 31.9* 19.7* 16.2* 13.5*  HGB 8.5* 8.7* 8.2* 9.1* 8.2*  HCT 28.6* 27.7* 26.7* 29.4* 26.4*  MCV 89.1 87.1 87.5 87.0 88.0  PLT 170 168 131* 123* 93*   Basic Metabolic Panel:  Recent Labs Lab 02/17/16 1030 02/17/16 1723 02/18/16 0430 02/18/16 1700  02/22/2016 0353 02/21/16 0510 02/22/16 0340 02/14/2016 0519 02/24/16 0514  NA  --   --  138  --   < > 140 138 138 139 138  K  --   --  3.8  --   < > 3.9 4.5 3.6 4.0 4.0  CL  --   --  100*  --   < > 99* 98* 102 101 102  CO2  --   --  28  --   < > 27 21* 27 22 25   GLUCOSE  --   --  128*  --   < > 90 169* 120* 114* 86  BUN  --   --  78*  --   < > 45* 79* 49* 68* 26*  CREATININE  --   --  3.65*  --   < > 2.70* 4.05* 2.74* 3.98* 2.42*  CALCIUM  --   --  7.7*  --   < > 8.2* 8.2* 7.8* 8.1* 7.7*  MG 1.9 2.1 2.1 2.1  --   --   --   --   --   --   PHOS 3.6 4.2 4.4 5.3*  < > 5.0* 8.9* 4.1 4.2 3.2  < > = values in this interval not displayed.   GFR: Estimated Creatinine Clearance: 31.1 mL/min (by C-G formula based on Cr of 2.42).  Liver Function Tests:  Recent Labs Lab 02/25/2016 0353 02/21/16 0510 02/22/16 0340 03/04/2016 0519 02/24/16 0514  ALBUMIN 1.8* 1.8* 1.7* 1.8* 1.7*    Coagulation Profile:  Recent Labs Lab 02/15/2016 0352  INR 1.13    Cardiac Enzymes:  Recent Labs Lab 02/13/2016 1832 02/24/16 0005 02/24/16 0514  TROPONINI 0.27* 0.32* 0.33*    HbA1C: HGB A1C MFR BLD  Date/Time Value Ref Range Status  02/02/2016 06:50 AM 7.7* 4.8 - 5.6 % Final    Comment:    (NOTE)         Pre-diabetes: 5.7 - 6.4          Diabetes: >6.4         Glycemic control for adults with diabetes: <7.0   01/25/2016 04:32 AM 8.2* 4.0 - 6.0 % Final    CBG:  Recent Labs Lab 02/04/2016 1524 02/10/2016 1939 02/10/2016 2331 02/24/16 0414 02/24/16 0756  GLUCAP 88 103* 98 91 87    Recent Results (from  the past 240 hour(s))  MRSA PCR Screening     Status: None   Collection Time: 02/24/2016  6:48 PM  Result Value Ref Range Status   MRSA by PCR NEGATIVE NEGATIVE Final    Comment:        The GeneXpert MRSA Assay (FDA approved for NASAL specimens only), is one component of a comprehensive MRSA colonization surveillance program. It is not intended to diagnose MRSA infection nor to guide or monitor treatment for MRSA infections.   Culture, blood (Routine X 2) w Reflex to ID Panel     Status: None (Preliminary result)   Collection Time: 02/19/2016  9:52 AM  Result Value Ref Range Status   Specimen Description BLOOD RIGHT HAND  Final   Special Requests IN PEDIATRIC BOTTLE  3CC  Final   Culture NO GROWTH 3 DAYS  Final   Report Status PENDING  Incomplete  Culture, blood (Routine X 2) w Reflex to ID Panel     Status: None (Preliminary result)   Collection Time: 02/08/2016  9:56 AM  Result Value Ref Range Status   Specimen Description BLOOD RIGHT HAND  Final   Special Requests IN PEDIATRIC BOTTLE  2CC  Final   Culture NO GROWTH 3 DAYS  Final   Report Status PENDING  Incomplete     Scheduled Meds: . antiseptic oral rinse  7 mL Mouth Rinse QID  . aspirin  81 mg Oral Daily  . atorvastatin  80 mg Per Tube q1800  . chlorhexidine gluconate (SAGE KIT)  15 mL Mouth Rinse BID  . darbepoetin (ARANESP) injection - DIALYSIS  100 mcg Intravenous Q Wed-HD  . feeding supplement (VITAL AF 1.2 CAL)  1,000 mL Per Tube Q24H  . insulin aspart  0-9 Units Subcutaneous Q4H  . metoprolol  2.5 mg Intravenous Q6H  . metroNIDAZOLE  500 mg Oral TID  . multivitamin  1 tablet Oral QHS  . pantoprazole sodium  40 mg Per Tube Daily   Continuous  Infusions: . sodium chloride 10 mL/hr at 02/22/16 0350  . heparin 950 Units/hr (02/24/16 0643)  . nitroGLYCERIN       LOS: 8 days    Cherene Altes, MD [facility] Office  [phone] Pager - Text Page per Amion as per below:  On-Call/Text Page:      [email or web address]      password [email or web address]  If 7PM-7AM, please contact night-coverage [email or web address] Password [facility] 02/24/2016, 8:53 AM

## 2016-02-24 NOTE — Progress Notes (Signed)
ANTICOAGULATION CONSULT NOTE Pharmacy Consult for heparin Indication: chest pain/ACS  Allergies  Allergen Reactions  . Penicillins Other (See Comments)    Reaction: Unknown    Patient Measurements: Height: 5\' 5"  (165.1 cm) Weight: 120 lb 13 oz (54.8 kg) IBW/kg (Calculated) : 61.5 Heparin Dosing Weight: 55.7 kg  Vital Signs: Temp: 98.8 F (37.1 C) (07/22 0417) Temp Source: Oral (07/22 0417) BP: 143/72 mmHg (07/22 0300) Pulse Rate: 91 (07/22 0300)  Labs:  Recent Labs  02/22/16 0340 02/07/2016 0519 02/04/2016 1832 02/24/16 0005 02/24/16 0514  HGB 8.2* 9.1*  --   --  8.2*  HCT 26.7* 29.4*  --   --  26.4*  PLT 131* 123*  --   --  93*  HEPARINUNFRC  --   --   --   --  <0.10*  CREATININE 2.74* 3.98*  --   --  2.42*  TROPONINI  --   --  0.27* 0.32* 0.33*    Estimated Creatinine Clearance: 31.1 mL/min (by C-G formula based on Cr of 2.42).  Assessment: 40 yo male with EKG changes and possible ACS for heparin  Goal of Therapy:  Heparin level 0.3-0.7 units/ml Monitor platelets by anticoagulation protocol: Yes    Plan:  Increase Heparin 950 units/hr Check heparin level in 8 hours.   Eddie Candle 02/24/2016,6:27 AM

## 2016-02-24 NOTE — Progress Notes (Signed)
SLP Cancellation Note  Patient Details Name: Tanner Campbell MRN: [id number] DOB: May 01, 1976   Cancelled treatment:        Pt's respiratory rate (35) and heart rate (150) were too high for PMV trial. Continue attempts for PMV usage.   Lindalou Hose Nakeya Adinolfi 02/24/2016, 12:54 PM

## 2016-02-24 NOTE — Progress Notes (Signed)
  Pt is on ATC 35% FiO2. Pt is tolerating it well no distress or complication noted. Pt was suctioned, moderate/strong strength cough. Got back small amount of tanish/clear secretions. Pt tolerate it well no complications noted. RT will continue to monitor.

## 2016-02-24 NOTE — Progress Notes (Signed)
Patient ID: Tanner Campbell, male   DOB: Jun 11, 1976, 40 y.o.   MRN: [id number] S: Yesterday evening patient became hypotensive (SBP 70) and tachycardic (HR 120s). EKG showed ST depression c/w inferolateral ischemia. CXR unchanged. He was placed back on vent. IV Heparin and IV Lopressor started.   O:BP 139/72 mmHg  Pulse 87  Temp(Src) 99.6 F (37.6 C) (Oral)  Resp 19  Ht 5' 5"  (1.651 m)  Wt 120 lb 13 oz (54.8 kg)  BMI 20.10 kg/m2  SpO2 100%  Intake/Output Summary (Last 24 hours) at 02/24/16 0902 Last data filed at 02/24/16 0700  Gross per 24 hour  Intake  616.2 ml  Output   1500 ml  Net -883.8 ml   Intake/Output: I/O last 3 completed shifts: In: 916.2 [I.V.:606.2; NG/GT:160; IV Piggyback:150] Out: 1500 [Other:1500]  Intake/Output this shift:    Weight change: -2 lb 3.3 oz (-1 kg) General appearance: On vent; Alert  Resp: occassional rhonchi Cardio: systolic murmur: holosystolic 3/6, blowing throughout the precordium and friction rub heard throughout precordium.  GI: soft, non-tender; bowel sounds normal; no masses, no organomegaly Extremities: LUE AVF +T/B, no edema Dialysis Access: LUE AVF   Recent Labs Lab 02/18/16 0430 02/18/16 1700 02/19/16 0405 02/11/2016 0353 02/21/16 0510 02/22/16 0340 02/22/2016 0519 02/24/16 0514  NA 138  --  139 140 138 138 139 138  K 3.8  --  4.9 3.9 4.5 3.6 4.0 4.0  CL 100*  --  98* 99* 98* 102 101 102  CO2 28  --  29 27 21* 27 22 25   GLUCOSE 128*  --  98 90 169* 120* 114* 86  BUN 78*  --  99* 45* 79* 49* 68* 26*  CREATININE 3.65*  --  4.43* 2.70* 4.05* 2.74* 3.98* 2.42*  ALBUMIN 1.5*  --  1.8* 1.8* 1.8* 1.7* 1.8* 1.7*  CALCIUM 7.7*  --  7.8* 8.2* 8.2* 7.8* 8.1* 7.7*  PHOS 4.4 5.3* 6.2* 5.0* 8.9* 4.1 4.2 3.2   Liver Function Tests:  Recent Labs Lab 02/22/16 0340 02/04/2016 0519 02/24/16 0514  ALBUMIN 1.7* 1.8* 1.7*   CBC:  Recent Labs Lab 02/27/2016 0352 02/21/16 0510 02/22/16 0340 02/21/2016 0519 02/24/16 0514  WBC 23.1*  31.9* 19.7* 16.2* 13.5*  HGB 8.5* 8.7* 8.2* 9.1* 8.2*  HCT 28.6* 27.7* 26.7* 29.4* 26.4*  MCV 89.1 87.1 87.5 87.0 88.0  PLT 170 168 131* 123* 93*   Cardiac Enzymes:  Recent Labs Lab 02/27/2016 1832 02/24/16 0005 02/24/16 0514  TROPONINI 0.27* 0.32* 0.33*   CBG:  Recent Labs Lab 02/08/2016 1524 02/24/2016 1939 02/15/2016 2331 02/24/16 0414 02/24/16 0756  GLUCAP 88 103* 98 91 87    Iron Studies:  No results for input(s): IRON, TIBC, TRANSFERRIN, FERRITIN in the last 72 hours. Studies/Results: Dg Chest Port 1 View  02/22/2016  CLINICAL DATA:  Respiratory failure, tracheostomy. EXAM: PORTABLE CHEST 1 VIEW COMPARISON:  Chest x-rays dated 02/10/2016 and 02/19/2016. FINDINGS: Interval tracheostomy appears well-positioned. Cardiomediastinal silhouette is otherwise stable in appearance. Enteric tube passes below the diaphragm. Bibasilar opacities are unchanged, likely a combination of atelectasis and pleural effusions. IMPRESSION: 1. Interval tracheostomy placement, well-positioned with tip at the level of the clavicles. 2. Otherwise stable exam. Persistent bibasilar opacities are most likely a combination of atelectasis and pleural effusions. Electronically Signed   By: Franki Cabot M.D.   On: 02/19/2016 18:47   Dg Abd Portable 1v  02/08/2016  CLINICAL DATA:  Feeding tube placement.  Diabetes.  Hypertension. EXAM: PORTABLE ABDOMEN - 1  VIEW COMPARISON:  02/10/2016 FINDINGS: Feeding tube is coiled in the stomach, with the tip at the gastric cardia. Right upper quadrant surgical clips. Bilateral pleural effusions. IMPRESSION: Feeding tube coiled in the stomach with tip at gastric cardia. Electronically Signed   By: Abigail Miyamoto M.D.   On: 03/03/2016 18:41   . antiseptic oral rinse  7 mL Mouth Rinse QID  . aspirin  81 mg Oral Daily  . atorvastatin  80 mg Per Tube q1800  . chlorhexidine gluconate (SAGE KIT)  15 mL Mouth Rinse BID  . darbepoetin (ARANESP) injection - DIALYSIS  100 mcg Intravenous  Q Wed-HD  . feeding supplement (VITAL AF 1.2 CAL)  1,000 mL Per Tube Q24H  . insulin aspart  0-9 Units Subcutaneous Q4H  . metoprolol  2.5 mg Intravenous Q6H  . metroNIDAZOLE  500 mg Oral TID  . multivitamin  1 tablet Oral QHS  . pantoprazole sodium  40 mg Per Tube Daily    BMET    Component Value Date/Time   NA 138 02/24/2016 0514   K 4.0 02/24/2016 0514   CL 102 02/24/2016 0514   CO2 25 02/24/2016 0514   GLUCOSE 86 02/24/2016 0514   BUN 26* 02/24/2016 0514   CREATININE 2.42* 02/24/2016 0514   CALCIUM 7.7* 02/24/2016 0514   CALCIUM 8.5* 02/03/2016 0600   GFRNONAA 32* 02/24/2016 0514   GFRAA 37* 02/24/2016 0514   CBC    Component Value Date/Time   WBC 13.5* 02/24/2016 0514   RBC 3.00* 02/24/2016 0514   HGB 8.2* 02/24/2016 0514   HCT 26.4* 02/24/2016 0514   PLT 93* 02/24/2016 0514   MCV 88.0 02/24/2016 0514   MCH 27.3 02/24/2016 0514   MCHC 31.1 02/24/2016 0514   RDW 16.6* 02/24/2016 0514   LYMPHSABS 0.6* 02/11/2016 0600   MONOABS 0.4 02/11/2016 0600   EOSABS 0.0 02/11/2016 0600   BASOSABS 0.0 02/11/2016 0600    Dialysis Orders: Center: [facility] on MWF .   Assessment/Plan: 1. VDRF - transferred from [facility] due to prolonged respiratory failure. PCCM managing- trach collar placed 7/18. Back on vent now s/p hemodynamic changes yesterday.  2. Heart murmer and friction rub - TEE showing EF 55-60% and no vegetations. ST segment depressions c/w inferolateral ischemia. Started on IV Heparin and IV Lopressor. Cardiology following; cardiac cath when stable?  3. ESRD - Continue with HD MWF via AVF. Patient is not fluid overloaded at present and electrolytes normal.     -F/u am renal function panel - plan for next HD on Monday 4. Hypertension/volume - stable- euvolemic on exam  5. Anemia - on Aranesp 191mg- recent inc. Iron studies showing low iron and low saturation ratio. However, will hold off repleting iron at this time in the setting of bacteremia.  -F/u am  CBC 6. Metabolic bone disease - Will follow ca/phos. Ca stable at present. Phosphorous is normal. PTH 107- no binder or vit D 7. Nutrition - per primary svc 8. ID- vionella bacteremia- continue flagyl x 2 weeks as per infectious disease recommendation- TEE ruled our veg- WBC normalizing  9. Respiratory failure 2/2 possible PNA - continue Cefepime x 8 days as per ID recommendation  10. Global- Plan will likely be to send him back to LTAC eventually.   VShela Leff PGY 2 - IMTS  Patient seen and examined, agree with above note with above modifications. Had some tachycardia and hypotension overnight- now back on vent support- HD yest without incident.  Planning next HD  for Monday via AVF- unfortunately now with trach will likely need to go to LTAC after hosp  Corliss Parish, MD 02/24/2016

## 2016-02-24 NOTE — Progress Notes (Signed)
Nutrition Follow-up  DOCUMENTATION CODES:   Severe malnutrition in context of acute illness/injury  INTERVENTION:   Re-Start Vital AF 1.2 at  goal rate of 60 ml/h to provide 1728 kcal, 108 gm protein, 1168 ml free water daily.  NUTRITION DIAGNOSIS:   Increased nutrient needs related to acute illness as evidenced by estimated needs.  Ongoing  GOAL:   Patient will meet greater than or equal to 90% of their needs  Being met  MONITOR:   TF tolerance, Labs, Weight trends, Diet advancement, Vent status, I & O's, Skin  REASON FOR ASSESSMENT:   Ventilator  (Assessment and reccomendations for Tube feeding)  ASSESSMENT:   40 year old esrd admitted initially to [facility] from [facility] d/t PNA and failure to wean. Self extubated 7/5. Clinically deteriorated over the following days. Required re-intubation 7/10 w/ findings of new HCAP and bacteremia. Transferred to [facility] on 7/14 with concerns for sepsis, NSTEMI, GIB, and endocarditis.   Pt put back on vent support. Cortrak tube clogged overnight. Pt back on trach collar this morning and RN about to re-start TF with Vital AF 1.2 @ 60 ml/hr at time of visit. Pt's weight is down 8 lbs from admission weight.   Patient is currently intubated on ventilator support MV: 7.9 L/min Temp (24hrs), Avg:98.8 F (37.1 C), Min:98 F (36.7 C), Max:99.6 F (37.6 C)  Labs: low calcium, elevated creatinine (trending down), low hemoglobin  Diet Order:  Diet NPO time specified  Skin:  Wound (see comment) (stage II pressure ulcer on sacrum)  Last BM:  7/22  Height:   Ht Readings from Last 1 Encounters:  02/14/2016 _0  (1.651 m)    Weight:   Wt Readings from Last 1 Encounters:  02/24/16 120 lb 13 oz (54.8 kg)    Ideal Body Weight:  59.1 kg  BMI:  Body mass index is 20.1 kg/(m^2).  Estimated Nutritional Needs:   Kcal:  1700-1900  Protein:  85-100 gm  Fluid:  1.8 L  EDUCATION NEEDS:   No education needs identified at this time  [place], LDN Inpatient Clinical Dietitian Pager: [phone] After Hours Pager: [phone]

## 2016-02-24 NOTE — Progress Notes (Addendum)
Patient cortrack no flushing  Or aspirating : Informed Elink. No Meds ordered at this time. Tube feeding being held till evaluation.  May need KUB in am.

## 2016-02-25 LAB — CULTURE, BLOOD (ROUTINE X 2)
CULTURE: NO GROWTH
Culture: NO GROWTH

## 2016-02-25 LAB — CBC
HEMATOCRIT: 27.6 % — AB (ref 39.0–52.0)
Hemoglobin: 8.2 g/dL — ABNORMAL LOW (ref 13.0–17.0)
MCH: 26.5 pg (ref 26.0–34.0)
MCHC: 29.7 g/dL — AB (ref 30.0–36.0)
MCV: 89 fL (ref 78.0–100.0)
PLATELETS: 126 10*3/uL — AB (ref 150–400)
RBC: 3.1 MIL/uL — ABNORMAL LOW (ref 4.22–5.81)
RDW: 17.1 % — AB (ref 11.5–15.5)
WBC: 15.1 10*3/uL — ABNORMAL HIGH (ref 4.0–10.5)

## 2016-02-25 LAB — RENAL FUNCTION PANEL
Albumin: 1.8 g/dL — ABNORMAL LOW (ref 3.5–5.0)
Anion gap: 9 (ref 5–15)
BUN: 43 mg/dL — AB (ref 6–20)
CHLORIDE: 104 mmol/L (ref 101–111)
CO2: 26 mmol/L (ref 22–32)
Calcium: 7.7 mg/dL — ABNORMAL LOW (ref 8.9–10.3)
Creatinine, Ser: 3.22 mg/dL — ABNORMAL HIGH (ref 0.61–1.24)
GFR calc Af Amer: 26 mL/min — ABNORMAL LOW (ref >60)
GFR, EST NON AFRICAN AMERICAN: 22 mL/min — AB (ref >60)
Glucose, Bld: 189 mg/dL — ABNORMAL HIGH (ref 65–99)
POTASSIUM: 4.2 mmol/L (ref 3.5–5.1)
Phosphorus: 3.3 mg/dL (ref 2.5–4.6)
Sodium: 139 mmol/L (ref 135–145)

## 2016-02-25 LAB — GLUCOSE, CAPILLARY
Glucose-Capillary: 212 mg/dL — ABNORMAL HIGH (ref 65–99)
Glucose-Capillary: 249 mg/dL — ABNORMAL HIGH (ref 65–99)
Glucose-Capillary: 243 mg/dL — ABNORMAL HIGH (ref 65–99)
Glucose-Capillary: 210 mg/dL — ABNORMAL HIGH (ref 65–99)
Glucose-Capillary: 163 mg/dL — ABNORMAL HIGH (ref 65–99)

## 2016-02-25 LAB — HEPARIN LEVEL (UNFRACTIONATED)
HEPARIN UNFRACTIONATED: 0.31 [IU]/mL (ref 0.30–0.70)
HEPARIN UNFRACTIONATED: 0.4 [IU]/mL (ref 0.30–0.70)
HEPARIN UNFRACTIONATED: 0.13 [IU]/mL — AB (ref 0.30–0.70)
Heparin Unfractionated: 0.2 IU/mL — ABNORMAL LOW (ref 0.30–0.70)

## 2016-02-25 LAB — TROPONIN I: TROPONIN I: 0.29 ng/mL — AB (ref <0.03)

## 2016-02-25 MED ORDER — PNEUMOCOCCAL VAC POLYVALENT 25 MCG/0.5ML IJ INJ
0.5000 mL | INJECTION | INTRAMUSCULAR | Status: AC
  Administered 2016-02-26: 0.5 mL via INTRAMUSCULAR
  Filled 2016-02-25: qty 0.5

## 2016-02-25 MED ORDER — INSULIN GLARGINE 100 UNIT/ML ~~LOC~~ SOLN
10.0000 [IU] | Freq: Two times a day (BID) | SUBCUTANEOUS | Status: DC
  Administered 2016-02-25 – 2016-02-28 (×7): 10 [IU] via SUBCUTANEOUS
  Filled 2016-02-25 (×9): qty 0.1

## 2016-02-25 NOTE — Progress Notes (Signed)
Patient ID: Tanner Campbell, male   DOB: May 28, 1976, 40 y.o.   MRN: [id number]  S: Fairly stable overnight- moved to step down- on trach collar  O:BP (!) 162/77   Pulse (!) 103   Temp 98.9 F (37.2 C) (Axillary)   Resp 20   Ht 5' 5" (1.651 m)   Wt 57.8 kg (127 lb 8 oz)   SpO2 100%   BMI 21.22 kg/m   Intake/Output Summary (Last 24 hours) at 02/25/16 0654 Last data filed at 02/25/16 0500  Gross per 24 hour  Intake          1367.25 ml  Output                0 ml  Net          1367.25 ml   Intake/Output: I/O last 3 completed shifts: In: 1385.4 [I.V.:683.4; NG/GT:552; IV Piggyback:150] Out: 1500 [Other:1500]  Intake/Output this shift:  Total I/O In: 600 [NG/GT:600] Out: -  Weight change: 2.134 kg (4 lb 11.3 oz) General appearance: On vent; Alert  Resp: occassional rhonchi Cardio: systolic murmur: holosystolic 3/6, blowing throughout the precordium and friction rub heard throughout precordium.  GI: soft, non-tender; bowel sounds normal; no masses, no organomegaly Extremities: LUE AVF +T/B, no edema Dialysis Access: LUE AVF   Recent Labs Lab 02/19/16 0405 03/04/2016 0353 02/21/16 0510 02/22/16 0340 02/04/2016 0519 02/24/16 0514 02/25/16 0300  NA 139 140 138 138 139 138 139  K 4.9 3.9 4.5 3.6 4.0 4.0 4.2  CL 98* 99* 98* 102 101 102 104  CO2 29 27 21* _0 GLUCOSE 98 90 169* 120* 114* 86 189*  BUN 99* 45* 79* 49* 68* 26* 43*  CREATININE 4.43* 2.70* 4.05* 2.74* 3.98* 2.42* 3.22*  ALBUMIN 1.8* 1.8* 1.8* 1.7* 1.8* 1.7* 1.8*  CALCIUM 7.8* 8.2* 8.2* 7.8* 8.1* 7.7* 7.7*  PHOS 6.2* 5.0* 8.9* 4.1 4.2 3.2 3.3   Liver Function Tests:  Recent Labs Lab 02/17/2016 0519 02/24/16 0514 02/25/16 0300  ALBUMIN 1.8* 1.7* 1.8*   CBC:  Recent Labs Lab 02/21/16 0510 02/22/16 0340 02/07/2016 0519 02/24/16 0514 02/25/16 0300  WBC 31.9* 19.7* 16.2* 13.5* 15.1*  HGB 8.7* 8.2* 9.1* 8.2* 8.2*  HCT 27.7* 26.7* 29.4* 26.4* 27.6*  MCV 87.1 87.5 87.0 88.0 89.0  PLT 168 131*  123* 93* 126*   Cardiac Enzymes:  Recent Labs Lab 02/11/2016 1832 02/24/16 0005 02/24/16 0514 02/25/16 0300  TROPONINI 0.27* 0.32* 0.33* 0.29*   CBG:  Recent Labs Lab 02/24/16 1206 02/24/16 1554 02/24/16 2040 02/24/16 2341 02/25/16 0424  GLUCAP 110* 165* 172* 212* 212*    Iron Studies:  No results for input(s): IRON, TIBC, TRANSFERRIN, FERRITIN in the last 72 hours. Studies/Results: Dg Chest Port 1 View  Result Date: 02/29/2016 CLINICAL DATA:  Respiratory failure, tracheostomy. EXAM: PORTABLE CHEST 1 VIEW COMPARISON:  Chest x-rays dated 02/18/2016 and 02/19/2016. FINDINGS: Interval tracheostomy appears well-positioned. Cardiomediastinal silhouette is otherwise stable in appearance. Enteric tube passes below the diaphragm. Bibasilar opacities are unchanged, likely a combination of atelectasis and pleural effusions. IMPRESSION: 1. Interval tracheostomy placement, well-positioned with tip at the level of the clavicles. 2. Otherwise stable exam. Persistent bibasilar opacities are most likely a combination of atelectasis and pleural effusions. Electronically Signed   By: Franki Cabot M.D.   On: 02/26/2016 18:47   Dg Abd Portable 1v  Result Date: 02/24/2016 CLINICAL DATA:  Chcking for feeding tube placement. Pt. Has hx of DM, and HTN. EXAM: PORTABLE  ABDOMEN - 1 VIEW COMPARISON:  02/11/2016 FINDINGS: Single supine view of the left-sided abdomen and upper pelvis. Feeding tube is looped in the stomach with tip directed cephalad at the gastric cardia. Non-obstructive bowel gas pattern. Left pleural effusion again identified. Cholecystectomy clips. IMPRESSION: Feeding tube looped in the stomach with tip directed cephalad at the gastric cardia. Electronically Signed   By: Abigail Miyamoto M.D.   On: 02/24/2016 10:04   Dg Abd Portable 1v  Result Date: 03/01/2016 CLINICAL DATA:  Feeding tube placement.  Diabetes.  Hypertension. EXAM: PORTABLE ABDOMEN - 1 VIEW COMPARISON:  02/10/2016 FINDINGS:  Feeding tube is coiled in the stomach, with the tip at the gastric cardia. Right upper quadrant surgical clips. Bilateral pleural effusions. IMPRESSION: Feeding tube coiled in the stomach with tip at gastric cardia. Electronically Signed   By: Abigail Miyamoto M.D.   On: 02/17/2016 18:41   . antiseptic oral rinse  7 mL Mouth Rinse QID  . aspirin  81 mg Per Tube Daily  . atorvastatin  80 mg Per Tube q1800  . chlorhexidine gluconate (SAGE KIT)  15 mL Mouth Rinse BID  . darbepoetin (ARANESP) injection - DIALYSIS  100 mcg Intravenous Q Wed-HD  . feeding supplement (VITAL AF 1.2 CAL)  1,000 mL Per Tube Q24H  . insulin aspart  0-9 Units Subcutaneous Q4H  . metoprolol  5 mg Intravenous Q6H  . metroNIDAZOLE  500 mg Per Tube TID  . multivitamin  1 tablet Oral QHS  . pantoprazole sodium  40 mg Per Tube Daily    BMET    Component Value Date/Time   NA 139 02/25/2016 0300   K 4.2 02/25/2016 0300   CL 104 02/25/2016 0300   CO2 26 02/25/2016 0300   GLUCOSE 189 (H) 02/25/2016 0300   BUN 43 (H) 02/25/2016 0300   CREATININE 3.22 (H) 02/25/2016 0300   CALCIUM 7.7 (L) 02/25/2016 0300   CALCIUM 8.5 (L) 02/03/2016 0600   GFRNONAA 22 (L) 02/25/2016 0300   GFRAA 26 (L) 02/25/2016 0300   CBC    Component Value Date/Time   WBC 15.1 (H) 02/25/2016 0300   RBC 3.10 (L) 02/25/2016 0300   HGB 8.2 (L) 02/25/2016 0300   HCT 27.6 (L) 02/25/2016 0300   PLT 126 (L) 02/25/2016 0300   MCV 89.0 02/25/2016 0300   MCH 26.5 02/25/2016 0300   MCHC 29.7 (L) 02/25/2016 0300   RDW 17.1 (H) 02/25/2016 0300   LYMPHSABS 0.6 (L) 02/11/2016 0600   MONOABS 0.4 02/11/2016 0600   EOSABS 0.0 02/11/2016 0600   BASOSABS 0.0 02/11/2016 0600    Dialysis Orders: Center: [facility] on MWF .   Assessment/Plan: 1. VDRF - transferred from [facility] due to prolonged respiratory failure. PCCM managing- trach placed 7/18. Back on vent intermittently   2. Heart murmer and friction rub - TEE showing EF 55-60% and no vegetations.  ST segment depressions c/w inferolateral ischemia.  on IV Heparin and IV Lopressor. Cardiology following; cardiac cath when stable?  3. ESRD - Continue with HD MWF via AVF.   -F/u am renal function panel - plan for next HD on Monday 4. Hypertension/volume - stable- euvolemic on exam - now BP is up- will inc UF with HD tomorrow since so much in 5. Anemia - on Aranesp 198mg- recent inc. Iron studies showing low iron and low saturation ratio. However, will hold off repleting iron at this time in the setting of bacteremia.   6. Metabolic bone disease - Will follow ca/phos.  Ca stable at present. Phosphorous is normal. PTH 107- no binder or vit D 7. Nutrition - per primary svc 8. ID- vionella bacteremia- continue flagyl  as per infectious disease recommendation- TEE ruled our veg- WBC normalizing  9. Respiratory failure 2/2 possible PNA - continue Cefepime x 8 days as per ID recommendation  10. Global- Plan will likely be to send him back to LT[facility] eventually.   , A

## 2016-02-25 NOTE — Progress Notes (Signed)
ANTICOAGULATION CONSULT NOTE Pharmacy Consult for heparin Indication: chest pain/ACS  Allergies  Allergen Reactions  . Penicillins Other (See Comments)    Reaction: Unknown    Patient Measurements: Height: 5\' 5"  (165.1 cm) Weight: 127 lb 8 oz (57.8 kg) IBW/kg (Calculated) : 61.5 Heparin Dosing Weight: 55.7 kg  Vital Signs: Temp: 98.9 F (37.2 C) (07/22 2345) Temp Source: Axillary (07/22 2345) BP: 156/82 (07/23 0300) Pulse Rate: 103 (07/23 0300)  Labs:  Recent Labs  02/03/2016 0519  02/24/16 0005  02/24/16 0514 02/24/16 1344 02/25/16 0004 02/25/16 0300  HGB 9.1*  --   --   --  8.2*  --   --  8.2*  HCT 29.4*  --   --   --  26.4*  --   --  27.6*  PLT 123*  --   --   --  93*  --   --  126*  HEPARINUNFRC  --   --   --   < > <0.10* <0.10* 0.13* 0.20*  CREATININE 3.98*  --   --   --  2.42*  --   --  3.22*  TROPONINI  --   < > 0.32*  --  0.33*  --   --  0.29*  < > = values in this interval not displayed.  Estimated Creatinine Clearance: 24.7 mL/min (by C-G formula based on SCr of 3.22 mg/dL).  Assessment: 40 yo male with EKG changes/elevated cardiac markers for heparin  Goal of Therapy:  Heparin level 0.3-0.7 units/ml Monitor platelets by anticoagulation protocol: Yes  Plan:  Increase Heparin to 1300 units/hr Check heparin level in 8 hours.   Geannie Risen, PharmD, BCPS  02/25/2016,5:13 AM

## 2016-02-25 NOTE — Progress Notes (Addendum)
ANTICOAGULATION CONSULT NOTE  Pharmacy Consult for heparin Indication: chest pain/ACS  Allergies  Allergen Reactions  . Penicillins Other (See Comments)    Reaction: Unknown    Patient Measurements: Height: 5\' 5"  (165.1 cm) Weight: 127 lb 8 oz (57.8 kg) IBW/kg (Calculated) : 61.5 Heparin Dosing Weight: 55.7 kg  Vital Signs: Temp: 98.9 F (37.2 C) (07/23 0400) Temp Source: Axillary (07/23 0400) BP: 162/77 (07/23 0500) Pulse Rate: 97 (07/23 0758)  Labs:  Recent Labs  02/25/2016 0519  02/24/16 0005 02/24/16 0514  02/25/16 0004 02/25/16 0300 02/25/16 0948  HGB 9.1*  --   --  8.2*  --   --  8.2*  --   HCT 29.4*  --   --  26.4*  --   --  27.6*  --   PLT 123*  --   --  93*  --   --  126*  --   HEPARINUNFRC  --   --   --  <0.10*  < > 0.13* 0.20* 0.31  CREATININE 3.98*  --   --  2.42*  --   --  3.22*  --   TROPONINI  --   < > 0.32* 0.33*  --   --  0.29*  --   < > = values in this interval not displayed.  Estimated Creatinine Clearance: 24.7 mL/min (by C-G formula based on SCr of 3.22 mg/dL).  Assessment: 40 yo male with EKG changes/elevated cardiac markers on IV heparin. Considering cath.  HL is therapeutic at 0.31 on 1300 units/hr. No bleeding noted, Hgb low stable, platelets are up to 126.  Goal of Therapy:  Heparin level 0.3-0.7 units/ml Monitor platelets by anticoagulation protocol: Yes  Plan:  Increase heparin drip to 1350 units/hr Confirmatory 6 hr HL Daily HL and CBC Monitor for s/sx of bleeding  Loura Back, PharmD, BCPS Clinical Pharmacist Pager: [phone] 02/25/2016 11:05 AM

## 2016-02-25 NOTE — Progress Notes (Signed)
Trach care done. New IC changed and locked back into place. Airway is patent. No distress or complications noted. Pt is stable at this time

## 2016-02-25 NOTE — Progress Notes (Addendum)
Tanner Campbell - Stepdown/ICU TEAM  [name]  HOZ:[id number] DOB: May 12, 1976 DOA: 02/22/2016 PCP: No primary care provider on file.    Brief Narrative:  40 y/o M with Hx of DM2, HTN, HLD, ESRD on HD (T, Th, S), COPD, and prior back surgery who initially was admitted to [facility] from 6/21 - 6/29 for acute respiratory failure in the setting of volume overload.  He decompensated 6/23 and required intubation. The patient was found to have H. Influenza positive sputum and treated with Rocephin. CXR demonstrated a large right pleural effusion and he underwent a thoracentesis (1L serous fluid removed - exudative by protein). He had difficulty with weaning and was transferred to [facility] on 6/29 orally intubated for ventilator weaning. The patient developed fever and antibiotics were expanded to vancomycin, cefepime and diflucan. He had progressed to weaning on PSV x 4 hours as of 7/3. He self extubated on 7/5. Since that time he required BiPAP off and on. After HD 7/10 his mental status worsened and he became minimally responsive. CXR was consistent with RLL opacification and he was re-intubated. He was re-cultured and also found to have increased Troponin for which Cardiology was consulted. He was placed on LMWH, but had a GIB while on this w/ his hgb dropping as low as 8.4.  On 7/14 he remained ventilator dependent. His sputum cultures grew MSSA and E coli and he was noted to have + blood cultures. Because of these acutely worsening issues it was felt best to transfer him to the ICU at [facility].   Significant Events: 6/21 admitted at [facility] 6/29 transfer to [facility] 7/14 transfer from [facility] to [facility] 7/18 trach placed  Subjective: The patient will track the examiner with his eyes at times but other times does not follow the examiner.  He is otherwise noncommunicative.  He does not appear uncomfortable the time of the exam.  Assessment & Plan:  Acute hypoxic respiratory failure - s/p trach 7/18 - HCAP Ventilator  management and trach care per PCCM - has completed a course of cefepime  Bacteremia with Veillonella species (Gram negative rod) continue Flagyl x 2 weeks for bacteremia as per ID recs   Bilateral pleural effusions s/p thoracentesis Stable on follow-up chest x-ray 7/21  NSTEMI vs demand ischemia Cardiology suggest he will need an eventual cardiac cath - tx w/ IV heparin since 7/21 at 18:30 - will plan to stop IV heparin at 72hr mark in setting of stabilizing troponin and recent occult GIB   Small pericardial effusion with friction rub Cardiology following - ANA negative - TEE without evidence of endocarditis/vegetations  Mild to moderate aortic regurgitation   ESRD on HD Nephrology following  Recent GIB on LMWH No evidence of acute blood loss today  Anemia of chronic kidney disease + acute blood loss  Hemoglobin presently stable at 8-9 - need to follow trend  DM type II w/ hyperglycemia  CBG climbing - adjust tx and follow   Acute Encephalopathy in setting of critical illness No signif change in mental status  Severe malnutrition in context of acute illness Continue supportive tube feeding - recheck position in AM w/ KUB   Disposition Planning  PT recommended CIR - CIR felt he is more appropriate for LTACH - sister does not want him to go back to [facility] because she didn't feel he was being treated well - SNF?   DVT prophylaxis: IV heparin  Code Status: FULL CODE Family Communication: no family present at time of exam  Disposition Plan: SDU   Consultants:  PCCM Cardiology  ID Nephrology  ENT  Antimicrobials:  Cipro 7/12>7/14 Vanc 7/2>7/17 Cefepime 7/14>7/21 Fluconazole 7/7>7/17 Flagyl 7/17 >  Objective: Blood pressure (!) 162/77, pulse 97, temperature 98.9 F (37.2 C), temperature source Axillary, resp. rate (!) 22, height _0  (Campbell.651 m), weight 57.8 kg (127 lb 8 oz), SpO2 100 %.  Intake/Output Summary (Last 24 hours) at 02/25/16 0909 Last data filed at  02/25/16 0500  Gross per 24 hour  Intake          1310.18 ml  Output                0 ml  Net          1310.18 ml   Filed Weights   02/10/2016 1056 02/24/16 0500 02/25/16 0457  Weight: 55.7 kg (122 lb 12.7 oz) 54.8 kg (120 lb 13 oz) 57.8 kg (127 lb 8 oz)    Examination: General: No acute respiratory distress - vent support via trach Lungs: Poor air movement bilateral bases - no wheeze or focal crackles  Cardiovascular: Regular rate and rhythm with unchanged persistent rub Abdomen: Nondistended, soft, bowel sounds positive, no rebound, no ascites, no appreciable mass Extremities: No significant cyanosis, clubbing, edema bilateral lower extremities  CBC:  Recent Labs Lab 02/21/16 0510 02/22/16 0340 02/26/2016 0519 02/24/16 0514 02/25/16 0300  WBC 31.9* 19.7* 16.2* 13.5* 15.Campbell*  HGB 8.7* 8.2* 9.Campbell* 8.2* 8.2*  HCT 27.7* 26.7* 29.4* 26.4* 27.6*  MCV 87.Campbell 87.5 87.0 88.0 89.0  PLT 168 131* 123* 93* 802*   Basic Metabolic Panel:  Recent Labs Lab 02/18/16 1700  02/21/16 0510 02/22/16 0340 02/11/2016 0519 02/24/16 0514 02/25/16 0300  NA  --   < > 138 138 139 138 139  K  --   < > 4.5 3.6 4.0 4.0 4.2  CL  --   < > 98* 102 101 102 104  CO2  --   < > 21* _1 GLUCOSE  --   < > 169* 120* 114* 86 189*  BUN  --   < > 79* 49* 68* 26* 43*  CREATININE  --   < > 4.05* 2.74* 3.98* 2.42* 3.22*  CALCIUM  --   < > 8.2* 7.8* 8.Campbell* 7.7* 7.7*  MG 2.Campbell  --   --   --   --   --   --   PHOS 5.3*  < > 8.9* 4.Campbell 4.2 3.2 3.3  < > = values in this interval not displayed.   GFR: Estimated Creatinine Clearance: 24.7 mL/min (by C-G formula based on SCr of 3.22 mg/dL).  Liver Function Tests:  Recent Labs Lab 02/21/16 0510 02/22/16 0340 02/07/2016 0519 02/24/16 0514 02/25/16 0300  ALBUMIN Campbell.8* Campbell.7* Campbell.8* Campbell.7* Campbell.8*    Coagulation Profile:  Recent Labs Lab 02/10/2016 0352  INR Campbell.13    Cardiac Enzymes:  Recent Labs Lab 02/27/2016 1832 02/24/16 0005 02/24/16 0514 02/25/16 0300    TROPONINI 0.27* 0.32* 0.33* 0.29*    HbA1C: Hgb A1c MFr Bld  Date/Time Value Ref Range Status  02/02/2016 06:50 AM 7.7 (H) 4.8 - 5.6 % Final    Comment:    (NOTE)         Pre-diabetes: 5.7 - 6.4         Diabetes: >6.4         Glycemic control for adults with diabetes: <7.0   01/25/2016 04:32 AM 8.2 (H) 4.0 - 6.0 %  Final    CBG:  Recent Labs Lab 02/24/16 1206 02/24/16 1554 02/24/16 2040 02/24/16 2341 02/25/16 0424  GLUCAP 110* 165* 172* 212* 212*    Recent Results (from the past 240 hour(s))  MRSA PCR Screening     Status: None   Collection Time: 03/03/2016  6:48 PM  Result Value Ref Range Status   MRSA by PCR NEGATIVE NEGATIVE Final    Comment:        The GeneXpert MRSA Assay (FDA approved for NASAL specimens only), is one component of a comprehensive MRSA colonization surveillance program. It is not intended to diagnose MRSA infection nor to guide or monitor treatment for MRSA infections.   Culture, blood (Routine X 2) w Reflex to ID Panel     Status: None (Preliminary result)   Collection Time: 02/24/2016  9:52 AM  Result Value Ref Range Status   Specimen Description BLOOD RIGHT HAND  Final   Special Requests IN PEDIATRIC BOTTLE  3CC  Final   Culture NO GROWTH 4 DAYS  Final   Report Status PENDING  Incomplete  Culture, blood (Routine X 2) w Reflex to ID Panel     Status: None (Preliminary result)   Collection Time: 03/03/2016  9:56 AM  Result Value Ref Range Status   Specimen Description BLOOD RIGHT HAND  Final   Special Requests IN PEDIATRIC BOTTLE  2CC  Final   Culture NO GROWTH 4 DAYS  Final   Report Status PENDING  Incomplete     Scheduled Meds: . antiseptic oral rinse  7 mL Mouth Rinse QID  . aspirin  81 mg Per Tube Daily  . atorvastatin  80 mg Per Tube q1800  . chlorhexidine gluconate (SAGE KIT)  15 mL Mouth Rinse BID  . darbepoetin (ARANESP) injection - DIALYSIS  100 mcg Intravenous Q Wed-HD  . feeding supplement (VITAL AF Campbell.2 CAL)  Campbell,000 mL Per  Tube Q24H  . insulin aspart  0-9 Units Subcutaneous Q4H  . metoprolol  5 mg Intravenous Q6H  . metroNIDAZOLE  500 mg Per Tube TID  . multivitamin  Campbell tablet Oral QHS  . pantoprazole sodium  40 mg Per Tube Daily   Continuous Infusions: . sodium chloride 10 mL/hr at 02/25/16 0459  . heparin Campbell,300 Units/hr (02/25/16 0050)  . nitroGLYCERIN       LOS: 9 days    Cherene Altes, MD [facility] Office  [phone] Pager - Text Page per Amion as per below:  On-Call/Text Page:      [email or web address]      password [email or web address]  If 7PM-7AM, please contact night-coverage [email or web address] Password [email or web address] 02/25/2016, 9:09 AM

## 2016-02-25 NOTE — Progress Notes (Signed)
Subjective: Off vent. Back on trach collar. No trach issues.  Objective: Vital signs in last 24 hours: Temp:  [98.8 F (37.1 C)-100 F (37.8 C)] 98.9 F (37.2 C) (07/22 2345) Pulse Rate:  [87-107] 101 (07/22 2345) Resp:  [13-34] 30 (07/22 2345) BP: (132-162)/(66-81) 152/78 mmHg (07/22 2345) SpO2:  [97 %-100 %] 99 % (07/22 2345) FiO2 (%):  [35 %-50 %] 35 % (07/22 2345) Weight:  [120 lb 13 oz (54.8 kg)] 120 lb 13 oz (54.8 kg) (07/22 0500)  Physical Exam: General: Sleeping. NAD. On trach collar. Ears: Normal auricles and EACs. Nose: Normal mucosa. Oral: Normal mucosa. No lesion. Neck: Trach in place. No bleeding.  Abdomen: Soft, non tender. Skin: No rashes   Recent Labs  02/24/2016 0519 02/24/16 0514  WBC 16.2* 13.5*  HGB 9.1* 8.2*  HCT 29.4* 26.4*  PLT 123* 93*    Recent Labs  02/14/2016 0519 02/24/16 0514  NA 139 138  K 4.0 4.0  CL 101 102  CO2 22 25  GLUCOSE 114* 86  BUN 68* 26*  CREATININE 3.98* 2.42*  CALCIUM 8.1* 7.7*    Medications:  I have reviewed the patient's current medications. Scheduled: . antiseptic oral rinse  7 mL Mouth Rinse QID  . aspirin  81 mg Per Tube Daily  . atorvastatin  80 mg Per Tube q1800  . chlorhexidine gluconate (SAGE KIT)  15 mL Mouth Rinse BID  . darbepoetin (ARANESP) injection - DIALYSIS  100 mcg Intravenous Q Wed-HD  . feeding supplement (VITAL AF 1.2 CAL)  1,000 mL Per Tube Q24H  . insulin aspart  0-9 Units Subcutaneous Q4H  . metoprolol  5 mg Intravenous Q6H  . metroNIDAZOLE  500 mg Per Tube TID  . multivitamin  1 tablet Oral QHS  . pantoprazole sodium  40 mg Per Tube Daily   IPP:NDLOPRAFO, fentaNYL (SUBLIMAZE) injection, heparin, lidocaine (PF), lidocaine-prilocaine, metoprolol, nitroGLYCERIN, pentafluoroprop-tetrafluoroeth, sennosides  Assessment/Plan: POD #5 s/p trach. Off vent. Pt with pneumonia and respiratory failure. Fresh trach care. Will change trach early next week.   LOS: 9 days   Sterlin Knightly,SUI  W 02/25/2016, 12:04 AM

## 2016-02-25 NOTE — Progress Notes (Signed)
ANTICOAGULATION CONSULT NOTE  Pharmacy Consult for heparin Indication: chest pain/ACS  Allergies  Allergen Reactions  . Penicillins Other (See Comments)    Reaction: Unknown    Patient Measurements: Height: 5\' 5"  (165.1 cm) Weight: 127 lb 8 oz (57.8 kg) IBW/kg (Calculated) : 61.5 Heparin Dosing Weight: 55.7 kg  Vital Signs: Temp: 97.5 F (36.4 C) (07/23 1634) Temp Source: Axillary (07/23 1634) BP: 157/76 (07/23 1634) Pulse Rate: 96 (07/23 1634)  Labs:  Recent Labs  03/02/2016 0519  02/24/16 0005 02/24/16 0514  02/25/16 0300 02/25/16 0948 02/25/16 1734  HGB 9.1*  --   --  8.2*  --  8.2*  --   --   HCT 29.4*  --   --  26.4*  --  27.6*  --   --   PLT 123*  --   --  93*  --  126*  --   --   HEPARINUNFRC  --   --   --  <0.10*  < > 0.20* 0.31 0.40  CREATININE 3.98*  --   --  2.42*  --  3.22*  --   --   TROPONINI  --   < > 0.32* 0.33*  --  0.29*  --   --   < > = values in this interval not displayed.  Estimated Creatinine Clearance: 24.7 mL/min (by C-G formula based on SCr of 3.22 mg/dL).  Assessment: 40 yo male with EKG changes/elevated cardiac markers on IV heparin. Considering cath.  HL is therapeutic at 0.4 on 1350 units/hr- rate was increased slightly this morning.  No bleeding noted, Hgb low stable, platelets are up to 126.  Goal of Therapy:  Heparin level 0.3-0.7 units/ml Monitor platelets by anticoagulation protocol: Yes  Plan:  Continue heparin drip at 1350 units/hr Daily HL and CBC Monitor for s/sx of bleeding  Raney Antwine D. Josian Lanese, PharmD, BCPS Clinical Pharmacist Pager: [phone] 02/25/2016 6:29 PM

## 2016-02-25 NOTE — Progress Notes (Signed)
Patient sister at bedside, request to be updated by MD on plan of care and patient prognosis. MD made aware via text page.

## 2016-02-25 NOTE — Progress Notes (Signed)
Trach care done. No distress noted. Cleansed with Sterile water and NS, dried and dressing applied.

## 2016-02-25 NOTE — Progress Notes (Signed)
Primary cardiologist: Dr. Lyman Bishop  Seen for followup: Elevated troponin I  Subjective:    Awake. Does not respond to questions but tracks with his eyes.  Objective:   Temp:  [98.8 F (37.1 C)-100 F (37.8 C)] 98.9 F (37.2 C) (07/23 0400) Pulse Rate:  [90-107] 97 (07/23 0758) Resp:  [14-31] 22 (07/23 0758) BP: (131-162)/(66-83) 162/77 (07/23 0500) SpO2:  [97 %-100 %] 100 % (07/23 0758) FiO2 (%):  [35 %-40 %] 35 % (07/23 0758) Weight:  [127 lb 8 oz (57.8 kg)] 127 lb 8 oz (57.8 kg) (07/23 0457) Last BM Date: 02/24/16  Filed Weights   02/26/2016 1056 02/24/16 0500 02/25/16 0457  Weight: 122 lb 12.7 oz (55.7 kg) 120 lb 13 oz (54.8 kg) 127 lb 8 oz (57.8 kg)    Intake/Output Summary (Last 24 hours) at 02/25/16 1034 Last data filed at 02/25/16 0500  Gross per 24 hour  Intake          1230.68 ml  Output                0 ml  Net          1230.68 ml    Telemetry: Sinus rhythm and sinus tachycardia.  Exam:  General: Chronically ill-appearing male in no distress.  HEENT: TC in place.  Lungs: Diminished breath sounds.  Cardiac: RRR with distant heart sounds, no gallop. 2/6 systolic murmur.  Abdomen: NABS.  Lab Results:  Basic Metabolic Panel:  Recent Labs Lab 02/18/16 1700  02/10/2016 0519 02/24/16 0514 02/25/16 0300  NA  --   < > 139 138 139  K  --   < > 4.0 4.0 4.2  CL  --   < > 101 102 104  CO2  --   < > 22 25 26   GLUCOSE  --   < > 114* 86 189*  BUN  --   < > 68* 26* 43*  CREATININE  --   < > 3.98* 2.42* 3.22*  CALCIUM  --   < > 8.1* 7.7* 7.7*  MG 2.1  --   --   --   --   < > = values in this interval not displayed.  Liver Function Tests:  Recent Labs Lab 02/18/2016 0519 02/24/16 0514 02/25/16 0300  ALBUMIN 1.8* 1.7* 1.8*    CBC:  Recent Labs Lab 02/24/2016 0519 02/24/16 0514 02/25/16 0300  WBC 16.2* 13.5* 15.1*  HGB 9.1* 8.2* 8.2*  HCT 29.4* 26.4* 27.6*  MCV 87.0 88.0 89.0  PLT 123* 93* 126*    Cardiac Enzymes:  Recent  Labs Lab 02/24/16 0005 02/24/16 0514 02/25/16 0300  TROPONINI 0.32* 0.33* 0.29*    Echocardiogram 02/24/2016: Study Conclusions  - Left ventricle: The cavity size was normal. Wall thickness was  normal. Systolic function was normal. The estimated ejection  fraction was in the range of 55% to 60%. Diffuse hypokinesis. - Aortic valve: No evidence of vegetation. There was mild to  moderate regurgitation directed centrally in the LVOT. - Mitral valve: No evidence of vegetation. - Left atrium: No evidence of thrombus in the atrial cavity or  appendage. No evidence of thrombus in the appendage. - Right atrium: No evidence of thrombus in the atrial cavity or  appendage. - Atrial septum: No defect or patent foramen ovale was identified. - Tricuspid valve: No evidence of vegetation. - Pulmonic valve: No evidence of vegetation.  Impressions:  - No evidence of endocarditis. There was no evidence of a  vegetation. Aortic insufficiency is seen, with a central jet. It  is likely due to degenerative changes. Nevertheless, it is  reasonable to repeat the evaluation of the aortic valve in 1-2  weeks if bacteremia and/or febrile illness persist despite  treatment.   Medications:   Scheduled Medications: . antiseptic oral rinse  7 mL Mouth Rinse QID  . aspirin  81 mg Per Tube Daily  . atorvastatin  80 mg Per Tube q1800  . chlorhexidine gluconate (SAGE KIT)  15 mL Mouth Rinse BID  . darbepoetin (ARANESP) injection - DIALYSIS  100 mcg Intravenous Q Wed-HD  . feeding supplement (VITAL AF 1.2 CAL)  1,000 mL Per Tube Q24H  . insulin aspart  0-9 Units Subcutaneous Q4H  . metoprolol  5 mg Intravenous Q6H  . metroNIDAZOLE  500 mg Per Tube TID  . multivitamin  1 tablet Oral QHS  . pantoprazole sodium  40 mg Per Tube Daily    Infusions: . sodium chloride 10 mL/hr at 02/25/16 0459  . heparin 1,300 Units/hr (02/25/16 0050)  . nitroGLYCERIN      PRN Medications: alteplase,  fentaNYL (SUBLIMAZE) injection, heparin, lidocaine (PF), lidocaine-prilocaine, metoprolol, nitroGLYCERIN, pentafluoroprop-tetrafluoroeth, sennosides   Assessment:   1. Elevated troponin I, suspect demand ischemia at this point with relatively low-level elevations. ECG with ischemic changes in the setting of respiratory distress and tachycardia. LVEF described in the range of 55-60% by most recent assessment by TEE on July 21.  2. Mild to moderate aortic regurgitation.  3. Gram-negative bacteremia, on antibiotics per CCM team. No vegetations noted by TEE.  4. Pericardial rub, small pericardial effusion by echocardiogram on July 13. At that time LVEF reported at 40-45% with inferoseptal hypokinesis.  5. ESRD on hemodialysis, followed by Nephrology.  6. Recent GI bleed.  7. Acute encephalopathy.   Plan/Discussion:    Continue aspirin, Lipitor, metoprolol (dose increased yesterday), and heparin.  No clear indication for urgent cardiac catheterization at this time, procedure can be reconsidered as the patient further stabilizes with other comorbid illnesses that remain active.   Satira Sark, M.D., F.A.C.C.

## 2016-02-26 ENCOUNTER — Encounter (HOSPITAL_COMMUNITY): Payer: Self-pay | Admitting: Cardiovascular Disease

## 2016-02-26 ENCOUNTER — Inpatient Hospital Stay (HOSPITAL_COMMUNITY): Payer: Medicare Other

## 2016-02-26 DIAGNOSIS — A4150 Gram-negative sepsis, unspecified: Secondary | ICD-10-CM | POA: Diagnosis not present

## 2016-02-26 LAB — RENAL FUNCTION PANEL
Albumin: 1.6 g/dL — ABNORMAL LOW (ref 3.5–5.0)
Anion gap: 11 (ref 5–15)
BUN: 70 mg/dL — ABNORMAL HIGH (ref 6–20)
CHLORIDE: 102 mmol/L (ref 101–111)
CO2: 23 mmol/L (ref 22–32)
Calcium: 7.5 mg/dL — ABNORMAL LOW (ref 8.9–10.3)
Creatinine, Ser: 4.37 mg/dL — ABNORMAL HIGH (ref 0.61–1.24)
GFR, EST AFRICAN AMERICAN: 18 mL/min — AB (ref >60)
GFR, EST NON AFRICAN AMERICAN: 15 mL/min — AB (ref >60)
Glucose, Bld: 137 mg/dL — ABNORMAL HIGH (ref 65–99)
POTASSIUM: 4.3 mmol/L (ref 3.5–5.1)
Phosphorus: 3.5 mg/dL (ref 2.5–4.6)
Sodium: 136 mmol/L (ref 135–145)

## 2016-02-26 LAB — HEPARIN LEVEL (UNFRACTIONATED): Heparin Unfractionated: 0.29 IU/mL — ABNORMAL LOW (ref 0.30–0.70)

## 2016-02-26 LAB — HEPATITIS B SURFACE ANTIGEN: HEP B S AG: NEGATIVE

## 2016-02-26 LAB — CBC
HCT: 23.1 % — ABNORMAL LOW (ref 39.0–52.0)
HEMATOCRIT: 24.2 % — AB (ref 39.0–52.0)
HEMOGLOBIN: 7.1 g/dL — AB (ref 13.0–17.0)
Hemoglobin: 7.3 g/dL — ABNORMAL LOW (ref 13.0–17.0)
MCH: 26.8 pg (ref 26.0–34.0)
MCH: 27.2 pg (ref 26.0–34.0)
MCHC: 30.2 g/dL (ref 30.0–36.0)
MCHC: 30.7 g/dL (ref 30.0–36.0)
MCV: 89 fL (ref 78.0–100.0)
MCV: 88.5 fL (ref 78.0–100.0)
Platelets: 111 10*3/uL — ABNORMAL LOW (ref 150–400)
Platelets: 112 10*3/uL — ABNORMAL LOW (ref 150–400)
RBC: 2.72 MIL/uL — ABNORMAL LOW (ref 4.22–5.81)
RBC: 2.61 MIL/uL — AB (ref 4.22–5.81)
RDW: 18.1 % — AB (ref 11.5–15.5)
RDW: 18.4 % — ABNORMAL HIGH (ref 11.5–15.5)
WBC: 13.1 10*3/uL — ABNORMAL HIGH (ref 4.0–10.5)
WBC: 10.8 10*3/uL — ABNORMAL HIGH (ref 4.0–10.5)

## 2016-02-26 LAB — PREPARE RBC (CROSSMATCH)

## 2016-02-26 LAB — GLUCOSE, CAPILLARY
GLUCOSE-CAPILLARY: 189 mg/dL — AB (ref 65–99)
GLUCOSE-CAPILLARY: 123 mg/dL — AB (ref 65–99)
GLUCOSE-CAPILLARY: 125 mg/dL — AB (ref 65–99)
Glucose-Capillary: 228 mg/dL — ABNORMAL HIGH (ref 65–99)
Glucose-Capillary: 157 mg/dL — ABNORMAL HIGH (ref 65–99)
Glucose-Capillary: 153 mg/dL — ABNORMAL HIGH (ref 65–99)

## 2016-02-26 LAB — ABO/RH: ABO/RH(D): AB POS

## 2016-02-26 MED ORDER — SODIUM CHLORIDE 0.9 % IV SOLN
125.0000 mg | INTRAVENOUS | Status: DC
  Administered 2016-02-27: 125 mg via INTRAVENOUS
  Filled 2016-02-26 (×3): qty 10

## 2016-02-26 MED ORDER — ISOSORBIDE DINITRATE 10 MG PO TABS
10.0000 mg | ORAL_TABLET | Freq: Three times a day (TID) | ORAL | Status: DC
  Administered 2016-02-26 – 2016-03-04 (×14): 10 mg
  Filled 2016-02-26 (×30): qty 1

## 2016-02-26 MED ORDER — METOPROLOL TARTRATE 25 MG/10 ML ORAL SUSPENSION
25.0000 mg | Freq: Two times a day (BID) | ORAL | Status: DC
  Administered 2016-02-26 – 2016-03-02 (×6): 25 mg
  Filled 2016-02-26 (×7): qty 10

## 2016-02-26 MED ORDER — HEPARIN SODIUM (PORCINE) 5000 UNIT/ML IJ SOLN
5000.0000 [IU] | Freq: Three times a day (TID) | INTRAMUSCULAR | Status: AC
  Administered 2016-02-26 – 2016-03-11 (×42): 5000 [IU] via SUBCUTANEOUS
  Filled 2016-02-26 (×41): qty 1

## 2016-02-26 MED ORDER — SODIUM CHLORIDE 0.9 % IV SOLN
Freq: Once | INTRAVENOUS | Status: AC
  Administered 2016-02-27: 02:00:00 via INTRAVENOUS

## 2016-02-26 MED ORDER — DARBEPOETIN ALFA 200 MCG/0.4ML IJ SOSY
200.0000 ug | PREFILLED_SYRINGE | INTRAMUSCULAR | Status: DC
  Filled 2016-02-26: qty 0.4

## 2016-02-26 NOTE — Progress Notes (Signed)
[place] TEAM 1 - Stepdown/ICU TEAM  Tanner Campbell  PJK:[id number] DOB: [birth year] DOA: 03/04/2016 PCP: No primary care provider on file.    Brief Narrative:  40 y/o M with Hx of DM2, HTN, HLD, ESRD on HD (T, Th, S), COPD, and prior back surgery who initially was admitted to [facility] from 6/21 - 6/29 for acute respiratory failure in the setting of volume overload.  He decompensated 6/23 and required intubation. The patient was found to have H. Influenza positive sputum and treated with Rocephin. CXR demonstrated a large right pleural effusion and he underwent a thoracentesis (1L serous fluid removed - exudative by protein). He had difficulty with weaning and was transferred to [facility] on 6/29 orally intubated for ventilator weaning. The patient developed fever and antibiotics were expanded to vancomycin, cefepime and diflucan. He had progressed to weaning on PSV x 4 hours as of 7/3. He self extubated on 7/5. Since that time he required BiPAP off and on. After HD 7/10 his mental status worsened and he became minimally responsive. CXR was consistent with RLL opacification and he was re-intubated. He was re-cultured and also found to have increased Troponin for which Cardiology was consulted. He was placed on LMWH, but had a GIB while on this w/ his hgb dropping as low as 8.4.  On 7/14 he remained ventilator dependent. His sputum cultures grew MSSA and E coli and he was noted to have + blood cultures. Because of these acutely worsening issues it was felt best to transfer him to the ICU at [facility].   Significant Events: 6/21 admitted at [facility] 6/29 transfer to [facility] 7/14 transfer from [facility] to [facility] 7/18 trach placed  Subjective: There has been no significant change in the patient's mental status.  He will open his eyes to voice and turn his eyes toward the examiner but he does not interact in any meaningful way nor does he follow simple commands.  He does not appear to be uncomfortable or in respiratory  distress.  Assessment & Plan:  Acute hypoxic respiratory failure - s/p trach 7/18 - HCAP Ventilator management and trach care per PCCM - has completed a course of cefepime - currently tolerating trach collar without difficulty  Bacteremia with Veillonella species (Gram negative rod) continue Flagyl x 2 weeks for bacteremia as per ID recs   Bilateral pleural effusions s/p thoracentesis Stable on follow-up chest x-ray 7/21 - no respiratory distress w/ stable oxygenation  NSTEMI vs demand ischemia Cardiology suggest he will need an eventual cardiac cath - tx w/ IV heparin since 7/21 at 18:30 - stop IV heparin at 72hr mark in setting of stabilizing troponin and recent occult GIB - transition to oral nitroglycerin  Small pericardial effusion with friction rub Cardiology following - ANA negative - TEE without evidence of endocarditis/vegetations - hemodynamically stable at present  Mild to moderate aortic regurgitation   ESRD on HD Nephrology following   Recent GIB on LMWH No evidence of acute blood loss a this time   Anemia of chronic kidney disease + acute blood loss  Hemoglobin had been stable at 8-9 - on a possible downward deflection today, or possibly just dilutional w/ need for HD today - follow trend - stop IV heparin today   Recent Labs Lab 02/22/16 0340 02/06/2016 0519 02/24/16 0514 02/25/16 0300 02/26/16 0455  HGB 8.2* 9.1* 8.2* 8.2* 7.3*     DM type II w/ hyperglycemia  CBG improving w/ resumption of lantus, but not yet at goal - follow  today w/o change   Acute Encephalopathy in setting of critical illness No signif change in mental status - baseline unclear to me   Severe malnutrition in context of acute illness Continue supportive tube feeding - KUB this morning confirms tube is in stomach   Disposition Planning  PT recommended CIR - CIR felt he is more appropriate for LTACH - sister does not want him to go back to [facility] because she didn't feel he was being  treated well - SNF?   DVT prophylaxis: IV heparin > SQ heparin  Code Status: FULL CODE Family Communication: no family present at time of exam  Disposition Plan: SDU   Consultants:  PCCM Cardiology  ID Nephrology  ENT  Antimicrobials:  Cipro 7/12>7/14 Vanc 7/2>7/17 Cefepime 7/14>7/21 Fluconazole 7/7>7/17 Flagyl 7/17 >  Objective: Blood pressure (!) 161/79, pulse 89, temperature 98.1 F (36.7 C), temperature source Oral, resp. rate (!) 4, height 5' 5"  (1.651 m), weight 57.5 kg (126 lb 11.2 oz), SpO2 100 %.  Intake/Output Summary (Last 24 hours) at 02/26/16 1019 Last data filed at 02/26/16 0535  Gross per 24 hour  Intake          1983.78 ml  Output                0 ml  Net          1983.78 ml   Filed Weights   02/24/16 0500 02/25/16 0457 02/26/16 0349  Weight: 54.8 kg (120 lb 13 oz) 57.8 kg (127 lb 8 oz) 57.5 kg (126 lb 11.2 oz)    Examination: General: No acute respiratory distress - on trach collar  Lungs: Poor air movement bilateral bases persists - no wheeze or focal crackles  Cardiovascular: Regular rate and rhythm with unchanged rub Abdomen: Nondistended, soft, bowel sounds positive, no rebound, no ascites, no mass Extremities: No significant cyanosis, clubbing, or edema bilateral lower extremities  CBC:  Recent Labs Lab 02/22/16 0340 03/04/2016 0519 02/24/16 0514 02/25/16 0300 02/26/16 0455  WBC 19.7* 16.2* 13.5* 15.1* 13.1*  HGB 8.2* 9.1* 8.2* 8.2* 7.3*  HCT 26.7* 29.4* 26.4* 27.6* 24.2*  MCV 87.5 87.0 88.0 89.0 89.0  PLT 131* 123* 93* 126* 563*   Basic Metabolic Panel:  Recent Labs Lab 02/21/16 0510 02/22/16 0340 02/22/2016 0519 02/24/16 0514 02/25/16 0300  NA 138 138 139 138 139  K 4.5 3.6 4.0 4.0 4.2  CL 98* 102 101 102 104  CO2 21* 27 22 25 26   GLUCOSE 169* 120* 114* 86 189*  BUN 79* 49* 68* 26* 43*  CREATININE 4.05* 2.74* 3.98* 2.42* 3.22*  CALCIUM 8.2* 7.8* 8.1* 7.7* 7.7*  PHOS 8.9* 4.1 4.2 3.2 3.3     GFR: Estimated Creatinine  Clearance: 24.6 mL/min (by C-G formula based on SCr of 3.22 mg/dL).  Liver Function Tests:  Recent Labs Lab 02/21/16 0510 02/22/16 0340 02/09/2016 0519 02/24/16 0514 02/25/16 0300  ALBUMIN 1.8* 1.7* 1.8* 1.7* 1.8*    Coagulation Profile:  Recent Labs Lab 02/22/2016 0352  INR 1.13    Cardiac Enzymes:  Recent Labs Lab 03/01/2016 1832 02/24/16 0005 02/24/16 0514 02/25/16 0300  TROPONINI 0.27* 0.32* 0.33* 0.29*    HbA1C: Hgb A1c MFr Bld  Date/Time Value Ref Range Status  02/02/2016 06:50 AM 7.7 (H) 4.8 - 5.6 % Final    Comment:    (NOTE)         Pre-diabetes: 5.7 - 6.4         Diabetes: >6.4  Glycemic control for adults with diabetes: <7.0   01/25/2016 04:32 AM 8.2 (H) 4.0 - 6.0 % Final    CBG:  Recent Labs Lab 02/25/16 1224 02/25/16 1654 02/25/16 2016 02/25/16 2355 02/26/16 0830  GLUCAP 243* 210* 163* 228* 189*    Recent Results (from the past 240 hour(s))  MRSA PCR Screening     Status: None   Collection Time: 02/13/2016  6:48 PM  Result Value Ref Range Status   MRSA by PCR NEGATIVE NEGATIVE Final    Comment:        The GeneXpert MRSA Assay (FDA approved for NASAL specimens only), is one component of a comprehensive MRSA colonization surveillance program. It is not intended to diagnose MRSA infection nor to guide or monitor treatment for MRSA infections.   Culture, blood (Routine X 2) w Reflex to ID Panel     Status: None   Collection Time: 02/25/2016  9:52 AM  Result Value Ref Range Status   Specimen Description BLOOD RIGHT HAND  Final   Special Requests IN PEDIATRIC BOTTLE  3CC  Final   Culture NO GROWTH 5 DAYS  Final   Report Status 02/25/2016 FINAL  Final  Culture, blood (Routine X 2) w Reflex to ID Panel     Status: None   Collection Time: 02/05/2016  9:56 AM  Result Value Ref Range Status   Specimen Description BLOOD RIGHT HAND  Final   Special Requests IN PEDIATRIC BOTTLE  2CC  Final   Culture NO GROWTH 5 DAYS  Final   Report  Status 02/25/2016 FINAL  Final     Scheduled Meds: . antiseptic oral rinse  7 mL Mouth Rinse QID  . aspirin  81 mg Per Tube Daily  . atorvastatin  80 mg Per Tube q1800  . chlorhexidine gluconate (SAGE KIT)  15 mL Mouth Rinse BID  . [START ON 02/28/2016] darbepoetin (ARANESP) injection - DIALYSIS  200 mcg Intravenous Q Wed-HD  . feeding supplement (VITAL AF 1.2 CAL)  1,000 mL Per Tube Q24H  . ferric gluconate (FERRLECIT/NULECIT) IV  125 mg Intravenous Q M,W,F-HD  . insulin aspart  0-9 Units Subcutaneous Q4H  . insulin glargine  10 Units Subcutaneous BID  . metoprolol  5 mg Intravenous Q6H  . metroNIDAZOLE  500 mg Per Tube TID  . multivitamin  1 tablet Oral QHS  . pantoprazole sodium  40 mg Per Tube Daily   Continuous Infusions: . sodium chloride 10 mL/hr at 02/25/16 0459  . nitroGLYCERIN       LOS: 10 days    Cherene Altes, MD [facility] Office  [phone] Pager - Text Page per Amion as per below:  On-Call/Text Page:      [email or web address]      password [email or web address]  If 7PM-7AM, please contact night-coverage [email or web address] Password [facility] 02/26/2016, 10:19 AM

## 2016-02-26 NOTE — Progress Notes (Signed)
Subjective: Resting comfortably in bed. On trach collar.   Objective: Vital signs in last 24 hours: Temp:  [97.5 F (36.4 C)-98.8 F (37.1 C)] 98.3 F (36.8 C) (07/24 0349) Pulse Rate:  [90-101] 98 (07/24 0721) Resp:  [7-29] 12 (07/24 0721) BP: (145-162)/(71-82) 160/77 (07/24 0721) SpO2:  [98 %-100 %] 98 % (07/24 0721) FiO2 (%):  [28 %-35 %] 28 % (07/24 0721) Weight:  [126 lb 11.2 oz (57.5 kg)] 126 lb 11.2 oz (57.5 kg) (07/24 0349)  Physical Exam: General: Sleeping. NAD. On trach collar. Ears: Normal auricles and EACs. Nose: Normal mucosa. Oral: Normal mucosa. No lesion. Neck: Trach in place. No bleeding.  Abdomen: Soft, non tender. Skin: No rashes   Recent Labs  02/25/16 0300 02/26/16 0455  WBC 15.1* 13.1*  HGB 8.2* 7.3*  HCT 27.6* 24.2*  PLT 126* 111*    Recent Labs  02/24/16 0514 02/25/16 0300  NA 138 139  K 4.0 4.2  CL 102 104  CO2 25 26  GLUCOSE 86 189*  BUN 26* 43*  CREATININE 2.42* 3.22*  CALCIUM 7.7* 7.7*    Medications:  I have reviewed the patient's current medications. Scheduled: . antiseptic oral rinse  7 mL Mouth Rinse QID  . aspirin  81 mg Per Tube Daily  . atorvastatin  80 mg Per Tube q1800  . chlorhexidine gluconate (SAGE KIT)  15 mL Mouth Rinse BID  . [START ON 02/28/2016] darbepoetin (ARANESP) injection - DIALYSIS  200 mcg Intravenous Q Wed-HD  . feeding supplement (VITAL AF 1.2 CAL)  1,000 mL Per Tube Q24H  . ferric gluconate (FERRLECIT/NULECIT) IV  125 mg Intravenous Q M,W,F-HD  . insulin aspart  0-9 Units Subcutaneous Q4H  . insulin glargine  10 Units Subcutaneous BID  . metoprolol  5 mg Intravenous Q6H  . metroNIDAZOLE  500 mg Per Tube TID  . multivitamin  1 tablet Oral QHS  . pantoprazole sodium  40 mg Per Tube Daily  . pneumococcal 23 valent vaccine  0.5 mL Intramuscular Tomorrow-1000   XBW:IOMBTDHRC, fentaNYL (SUBLIMAZE) injection, heparin, lidocaine (PF), lidocaine-prilocaine, metoprolol, nitroGLYCERIN,  pentafluoroprop-tetrafluoroeth, sennosides  Assessment/Plan: POD #6 s/p trach. Off vent. Pt with pneumonia and respiratory failure. Fresh trach care. Will change to an uncuffed trach today or tomorrow.   LOS: 10 days   Tanner Campbell,SUI W 02/26/2016, 7:30 AM

## 2016-02-26 NOTE — Anesthesia Postprocedure Evaluation (Signed)
Anesthesia Post Note  Patient: Tanner Campbell  Procedure(s) Performed: Procedure(s) (LRB): TRANSESOPHAGEAL ECHOCARDIOGRAM (TEE) (N/A)  Patient location during evaluation: Endoscopy Anesthesia Type: MAC Level of consciousness: awake and alert Pain management: pain level controlled Vital Signs Assessment: post-procedure vital signs reviewed and stable Respiratory status: spontaneous breathing, nonlabored ventilation, respiratory function stable and patient connected to nasal cannula oxygen Cardiovascular status: stable and blood pressure returned to baseline Anesthetic complications: no    Last Vitals:  Vitals:   02/26/16 0721 02/26/16 0810  BP: (!) 160/77 (!) 161/79  Pulse: 98 89  Resp: 12 (!) 4  Temp:  36.7 C    Last Pain:  Vitals:   02/26/16 0810  TempSrc: Oral  PainSc:                  Cecile Hearing

## 2016-02-26 NOTE — Progress Notes (Signed)
CSW informed that pt family wanting SNF that can accomodate dialysis and trach  Outpatient dialysis centers do NOT accept trachs- pt would need to be decannulated prior to returning to their outpatient dialysis in [facility]  CSW exploring other possible options but no local SNFs available that do in house dialysis and trach care- pt would likely need to go out of state for one of these facilities  Merlyn Lot, [facility] Clinical Social Worker [phone]

## 2016-02-26 NOTE — Progress Notes (Signed)
Subjective: Interval History: has no complaint, not coop just stare, no intercurrent events.  Objective: Vital signs in last 24 hours: Temp:  [97.5 F (36.4 C)-98.8 F (37.1 C)] 98.3 F (36.8 C) (07/24 0349) Pulse Rate:  [90-101] 93 (07/24 0500) Resp:  [7-29] 7 (07/24 0500) BP: (145-162)/(71-82) 145/71 (07/24 0500) SpO2:  [98 %-100 %] 100 % (07/24 0500) FiO2 (%):  [28 %-35 %] 28 % (07/23 2332) Weight:  [57.5 kg (126 lb 11.2 oz)] 57.5 kg (126 lb 11.2 oz) (07/24 0349) Weight change: -0.363 kg (-12.8 oz)  Intake/Output from previous day: 07/23 0701 - 07/24 0700 In: 1983.8 [I.V.:508.8; NG/GT:1475] Out: 0  Intake/Output this shift: No intake/output data recorded.  General appearance: Trach Neck: not coop, opens eyes, no response to verbal stim except opens eyes Resp: diminished breath sounds bilaterally and rales bibasilar Cardio: S1, S2 normal, S4 present and systolic murmur: holosystolic 2/6, blowing at apex GI: soft, non-tender; bowel sounds normal; no masses,  no organomegaly Extremities: AVF LUA  Lab Results:  Recent Labs  02/25/16 0300 02/26/16 0455  WBC 15.1* 13.1*  HGB 8.2* 7.3*  HCT 27.6* 24.2*  PLT 126* 111*   BMET:  Recent Labs  02/24/16 0514 02/25/16 0300  NA 138 139  K 4.0 4.2  CL 102 104  CO2 25 26  GLUCOSE 86 189*  BUN 26* 43*  CREATININE 2.42* 3.22*  CALCIUM 7.7* 7.7*   No results for input(s): PTH in the last 72 hours. Iron Studies: No results for input(s): IRON, TIBC, TRANSFERRIN, FERRITIN in the last 72 hours.  Studies/Results: Dg Abd Portable 1v  Result Date: 02/26/2016 CLINICAL DATA:  Feeding tube dysfunction. EXAM: PORTABLE ABDOMEN - 1 VIEW COMPARISON:  02/24/2016. FINDINGS: Feeding tube noted with tip in the stomach. Soft tissue structures are unremarkable. No bowel distention. Surgical clips in the pelvis. No acute bony abnormality. IMPRESSION: 1. Feeding tube noted with tip in the stomach. 2. No acute intra-abdominal abnormality.  Electronically Signed   By: Maisie Fus  Register   On: 02/26/2016 07:05  Dg Abd Portable 1v  Result Date: 02/24/2016 CLINICAL DATA:  Chcking for feeding tube placement. Pt. Has hx of DM, and HTN. EXAM: PORTABLE ABDOMEN - 1 VIEW COMPARISON:  Mar 16, 2016 FINDINGS: Single supine view of the left-sided abdomen and upper pelvis. Feeding tube is looped in the stomach with tip directed cephalad at the gastric cardia. Non-obstructive bowel gas pattern. Left pleural effusion again identified. Cholecystectomy clips. IMPRESSION: Feeding tube looped in the stomach with tip directed cephalad at the gastric cardia. Electronically Signed   By: Jeronimo Greaves M.D.   On: 02/24/2016 10:04    I have reviewed the patient's current medications.  Assessment/Plan: 1 ESRD vol xs, , HTN lower at HD 2 HTN lower vol 3 Nutrition TF 4 MS ?? State of mentation 5 Anemia needs Fe, ^ESA 6 HPTH controlle 7 MS 8 Pneu  9 Vol xs 10 Debil 11 Trach P HD, lower vol , ^ esa, give Fe, ?? What are goals    LOS: 10 days   Glendy Barsanti L 02/26/2016,7:13 AM

## 2016-02-26 NOTE — Progress Notes (Signed)
SLP Cancellation Note  Patient Details Name: Colby Reels MRN: [id number] DOB: 1975-10-23   Cancelled treatment:       Reason Eval/Treat Not Completed: Patient at procedure or test/unavailable - HD.  Will return next date/   Carolan Shiver 02/26/2016, 2:24 PM

## 2016-02-26 NOTE — Progress Notes (Signed)
LV function has improved by echo. Troponin elevation low level, may be related to hypoxia, demand ischemia or GIB. We have discussed LHC and he is on heparin for recent decompensation, however, troponin did not acutely elevate (this was on 7/21). He does not yet appear to be in a condition for cath. Therefore,discontinue heparin since it has been >48 hours and no indication of new ischemia.  Chrystie Nose, MD, [facility] Attending Cardiologist [facility]

## 2016-02-26 NOTE — Procedures (Signed)
I was present at this session.  I have reviewed the session itself and made appropriate changes.  HD via LUA AVF.  bp 140s sys.  For 3 liters to help bp.  Access prss ok.  Aldean Pipe L 7/24/20172:14 PM

## 2016-02-26 NOTE — Progress Notes (Signed)
02/26/2016  Pneumococcal Vaccine given in Right Deltoid at 0929. Lot #: [id number] and expire August 24, 2017. [facility] RN.

## 2016-02-26 NOTE — Progress Notes (Signed)
PT Cancellation Note  Patient Details Name: Tanner Campbell MRN: [id number] DOB: 1975/12/13   Cancelled Treatment:    Reason Eval/Treat Not Completed: Patient at procedure or test/unavailable (hemodialysis). Will attempt to see 7/25.   Arwilda [place] 02/26/2016, 3:22 PM  Pager [phone]

## 2016-02-26 NOTE — Care Management Important Message (Signed)
Important Message  Patient Details  Name: Dazon Surges MRN: [id number] Date of Birth: 1976/02/11   Medicare Important Message Given:  Yes    [name] 02/26/2016, 10:42 AM

## 2016-02-27 DIAGNOSIS — J189 Pneumonia, unspecified organism: Secondary | ICD-10-CM

## 2016-02-27 DIAGNOSIS — J9600 Acute respiratory failure, unspecified whether with hypoxia or hypercapnia: Secondary | ICD-10-CM

## 2016-02-27 LAB — CBC
HEMATOCRIT: 26.3 % — AB (ref 39.0–52.0)
HEMATOCRIT: 24.5 % — AB (ref 39.0–52.0)
HEMOGLOBIN: 8.2 g/dL — AB (ref 13.0–17.0)
HEMOGLOBIN: 7.8 g/dL — AB (ref 13.0–17.0)
MCH: 27.5 pg (ref 26.0–34.0)
MCH: 28 pg (ref 26.0–34.0)
MCHC: 31.2 g/dL (ref 30.0–36.0)
MCHC: 31.8 g/dL (ref 30.0–36.0)
MCV: 88.3 fL (ref 78.0–100.0)
MCV: 87.8 fL (ref 78.0–100.0)
Platelets: 115 10*3/uL — ABNORMAL LOW (ref 150–400)
Platelets: 116 10*3/uL — ABNORMAL LOW (ref 150–400)
RBC: 2.98 MIL/uL — ABNORMAL LOW (ref 4.22–5.81)
RBC: 2.79 MIL/uL — ABNORMAL LOW (ref 4.22–5.81)
RDW: 17.8 % — AB (ref 11.5–15.5)
RDW: 18 % — ABNORMAL HIGH (ref 11.5–15.5)
WBC: 9.4 10*3/uL (ref 4.0–10.5)
WBC: 9.2 10*3/uL (ref 4.0–10.5)

## 2016-02-27 LAB — RENAL FUNCTION PANEL
ALBUMIN: 1.6 g/dL — AB (ref 3.5–5.0)
ANION GAP: 13 (ref 5–15)
BUN: 69 mg/dL — ABNORMAL HIGH (ref 6–20)
CO2: 25 mmol/L (ref 22–32)
Calcium: 7.7 mg/dL — ABNORMAL LOW (ref 8.9–10.3)
Chloride: 100 mmol/L — ABNORMAL LOW (ref 101–111)
Creatinine, Ser: 4.17 mg/dL — ABNORMAL HIGH (ref 0.61–1.24)
GFR calc Af Amer: 19 mL/min — ABNORMAL LOW (ref >60)
GFR calc non Af Amer: 16 mL/min — ABNORMAL LOW (ref >60)
GLUCOSE: 119 mg/dL — AB (ref 65–99)
PHOSPHORUS: 3.8 mg/dL (ref 2.5–4.6)
POTASSIUM: 4.8 mmol/L (ref 3.5–5.1)
Sodium: 138 mmol/L (ref 135–145)

## 2016-02-27 LAB — GLUCOSE, CAPILLARY
GLUCOSE-CAPILLARY: 141 mg/dL — AB (ref 65–99)
GLUCOSE-CAPILLARY: 98 mg/dL (ref 65–99)
GLUCOSE-CAPILLARY: 99 mg/dL (ref 65–99)
Glucose-Capillary: 104 mg/dL — ABNORMAL HIGH (ref 65–99)
Glucose-Capillary: 118 mg/dL — ABNORMAL HIGH (ref 65–99)
Glucose-Capillary: 130 mg/dL — ABNORMAL HIGH (ref 65–99)

## 2016-02-27 MED ORDER — LIDOCAINE HCL (PF) 1 % IJ SOLN: 5.0000 mL | INTRAMUSCULAR | Status: DC | PRN

## 2016-02-27 MED ORDER — SODIUM CHLORIDE 0.9 % IV SOLN: 100.0000 mL | INTRAVENOUS | Status: DC | PRN

## 2016-02-27 MED ORDER — ALTEPLASE 2 MG IJ SOLR: 2.0000 mg | Freq: Once | INTRAMUSCULAR | Status: DC | PRN

## 2016-02-27 MED ORDER — OXYCODONE HCL 5 MG PO TABS
ORAL_TABLET | ORAL | Status: AC
  Administered 2016-02-27: 10 mg via ORAL
  Filled 2016-02-27: qty 2

## 2016-02-27 MED ORDER — HEPARIN SODIUM (PORCINE) 1000 UNIT/ML DIALYSIS: 20.0000 [IU]/kg | INTRAMUSCULAR | Status: DC | PRN

## 2016-02-27 MED ORDER — OXYCODONE HCL 5 MG PO TABS
5.0000 mg | ORAL_TABLET | ORAL | Status: DC | PRN
  Administered 2016-02-27 (×2): 10 mg via ORAL
  Administered 2016-02-28 (×2): 5 mg via ORAL
  Filled 2016-02-27: qty 2
  Filled 2016-02-27 (×2): qty 1

## 2016-02-27 MED ORDER — LIDOCAINE-PRILOCAINE 2.5-2.5 % EX CREA: 1.0000 "application " | TOPICAL_CREAM | CUTANEOUS | Status: DC | PRN

## 2016-02-27 MED ORDER — PENTAFLUOROPROP-TETRAFLUOROETH EX AERO: 1.0000 "application " | INHALATION_SPRAY | CUTANEOUS | Status: DC | PRN

## 2016-02-27 MED ORDER — MORPHINE SULFATE (PF) 2 MG/ML IV SOLN
INTRAVENOUS | Status: AC
  Administered 2016-02-27: 2 mg via INTRAVENOUS
  Filled 2016-02-27: qty 1

## 2016-02-27 MED ORDER — HEPARIN SODIUM (PORCINE) 1000 UNIT/ML DIALYSIS: 1000.0000 [IU] | INTRAMUSCULAR | Status: DC | PRN

## 2016-02-27 MED ORDER — MORPHINE SULFATE (PF) 2 MG/ML IV SOLN
1.0000 mg | INTRAVENOUS | Status: DC | PRN
  Administered 2016-02-27 – 2016-03-05 (×21): 2 mg via INTRAVENOUS
  Administered 2016-03-07: 1 mg via INTRAVENOUS
  Administered 2016-03-07 (×2): 2 mg via INTRAVENOUS
  Administered 2016-03-07: 1 mg via INTRAVENOUS
  Administered 2016-03-08 – 2016-03-21 (×29): 2 mg via INTRAVENOUS
  Filled 2016-02-27 (×51): qty 1

## 2016-02-27 MED ORDER — HEPARIN SODIUM (PORCINE) 1000 UNIT/ML DIALYSIS: 40.0000 [IU]/kg | Freq: Once | INTRAMUSCULAR | Status: DC

## 2016-02-27 NOTE — Progress Notes (Signed)
[place] TEAM 1 - Stepdown/ICU TEAM  Tanner Campbell  MGN:[id number] DOB: 1975-10-05 DOA: 02/15/2016 PCP: No primary care provider on file.    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  He decompensated 6/23 and required intubation. The patient was found to have H. Influenza positive sputum and treated with Rocephin. CXR demonstrated a large right pleural effusion and he underwent a thoracentesis (1L serous fluid removed - exudative by protein). He had difficulty with weaning and was transferred to [facility] on 6/29 orally intubated for ventilator weaning. The patient developed fever and antibiotics were expanded to vancomycin, cefepime and diflucan. He had progressed to weaning on PSV x 4 hours as of 7/3. He self extubated on 7/5. Since that time he required BiPAP off and on. After HD 7/10 his mental status worsened and he became minimally responsive. CXR was consistent with RLL opacification and he was re-intubated. He was re-cultured and also found to have increased Troponin for which Cardiology was consulted. He was placed on LMWH, but had a GIB while on this w/ his hgb dropping as low as 8.4.  On 7/14 he remained ventilator dependent. His sputum cultures grew MSSA and E coli and he was noted to have + blood cultures. Because of these acutely worsening issues it was felt best to transfer him to the ICU at [facility].   Significant Events: 6/21 admitted at [facility] 6/29 transfer to [facility] 7/14 transfer from [facility] to [facility] 7/18 trach placed  Subjective: More alert today, but c/o uncontrolled low back pain.  Denies sob, cp, n/v, or abdom pain.  Trach changed to #6 uncufffed today per ENT, who then signed off.    Assessment & Plan:  Acute hypoxic respiratory failure - s/p trach 7/18 - HCAP Ventilator management and trach care per PCCM -  has completed a course of cefepime - currently tolerating trach collar without difficulty  Bacteremia with Veillonella species (Gram negative rod) continue Flagyl x 2 weeks for bacteremia as per ID recs   Bilateral pleural effusions s/p thoracentesis Stable on follow-up chest x-ray 7/21 - no respiratory distress w/ stable oxygenation  NSTEMI vs demand ischemia Cardiology suggest he will need an eventual cardiac cath - now off IV heparin  Small pericardial effusion with friction rub Cardiology following - ANA negative - TEE without evidence of endocarditis/vegetations - hemodynamically stable  Mild to moderate aortic regurgitation   ESRD on HD Nephrology following   Recent GIB on LMWH No evidence of acute blood loss a this time   Anemia of chronic kidney disease + acute blood loss  No evidence of blood loss, but Hgb drifted down to 7.1 - s/p 1U PRBC 7/24 - Hgb has improved as expected - follow   Recent Labs Lab 02/25/16 0300 02/26/16 0455 02/26/16 1711 02/27/16 0918 02/27/16 1130  HGB 8.2* 7.3* 7.1* 8.2* 7.8*    DM type II w/ hyperglycemia  CBG stable at this time   Acute Encephalopathy in setting of critical illness Appears to be slowly improving - attempt to avoid sedatives and follow   Severe malnutrition in context of acute illness Continue supportive tube feeding  Disposition Planning  PT recommended CIR - CIR felt he is more appropriate for LTACH - sister does not want him to go back to [facility] because she  didn't feel he was being treated well - plan is for SNF placement, but will have to be decannulated as he is ESRD and requires HD and there are no area SNFs that accept ESRD AND trach patients    DVT prophylaxis: SQ heparin  Code Status: FULL CODE Family Communication: no family present at time of exam  Disposition Plan: SDU   Consultants:  PCCM Cardiology  ID Nephrology  ENT  Antimicrobials:  Cipro 7/12>7/14 Vanc 7/2>7/17 Cefepime  7/14>7/21 Fluconazole 7/7>7/17 Flagyl 7/17 >  Objective: Blood pressure 136/74, pulse 92, temperature 98.5 F (36.9 C), resp. rate 16, height 5' 5"  (1.651 m), weight 58.4 kg (128 lb 12 oz), SpO2 98 %.  Intake/Output Summary (Last 24 hours) at 02/27/16 1526 Last data filed at 02/27/16 1430  Gross per 24 hour  Intake             1150 ml  Output             2514 ml  Net            -1364 ml   Filed Weights   02/27/16 0320 02/27/16 1121 02/27/16 1422  Weight: 59.4 kg (130 lb 15.3 oz) 60.9 kg (134 lb 4.2 oz) 58.4 kg (128 lb 12 oz)    Examination: General: No acute respiratory distress - more alert and interactive  Lungs: Poor air movement bilateral bases w/o change - no wheeze  Cardiovascular: Regular rate and rhythm - stable rub Abdomen: Nondistended, soft, bowel sounds positive, no rebound, no ascites, no mass Extremities: No significant cyanosis, clubbing, edema bilateral lower extremities  CBC:  Recent Labs Lab 02/25/16 0300 02/26/16 0455 02/26/16 1711 02/27/16 0918 02/27/16 1130  WBC 15.1* 13.1* 10.8* 9.4 9.2  HGB 8.2* 7.3* 7.1* 8.2* 7.8*  HCT 27.6* 24.2* 23.1* 26.3* 24.5*  MCV 89.0 89.0 88.5 88.3 87.8  PLT 126* 111* 112* 115* 245*   Basic Metabolic Panel:  Recent Labs Lab 02/22/2016 0519 02/24/16 0514 02/25/16 0300 02/26/16 1406 02/27/16 1130  NA 139 138 139 136 138  K 4.0 4.0 4.2 4.3 4.8  CL 101 102 104 102 100*  CO2 22 25 26 23 25   GLUCOSE 114* 86 189* 137* 119*  BUN 68* 26* 43* 70* 69*  CREATININE 3.98* 2.42* 3.22* 4.37* 4.17*  CALCIUM 8.1* 7.7* 7.7* 7.5* 7.7*  PHOS 4.2 3.2 3.3 3.5 3.8     GFR: Estimated Creatinine Clearance: 19.3 mL/min (by C-G formula based on SCr of 4.17 mg/dL).  Liver Function Tests:  Recent Labs Lab 03/02/2016 0519 02/24/16 0514 02/25/16 0300 02/26/16 1406 02/27/16 1130  ALBUMIN 1.8* 1.7* 1.8* 1.6* 1.6*    Cardiac Enzymes:  Recent Labs Lab 02/11/2016 1832 02/24/16 0005 02/24/16 0514 02/25/16 0300  TROPONINI 0.27*  0.32* 0.33* 0.29*    HbA1C: Hgb A1c MFr Bld  Date/Time Value Ref Range Status  02/02/2016 06:50 AM 7.7 (H) 4.8 - 5.6 % Final    Comment:    (NOTE)         Pre-diabetes: 5.7 - 6.4         Diabetes: >6.4         Glycemic control for adults with diabetes: <7.0   01/25/2016 04:32 AM 8.2 (H) 4.0 - 6.0 % Final    CBG:  Recent Labs Lab 02/26/16 1615 02/26/16 2012 02/26/16 2344 02/27/16 0324 02/27/16 0746  GLUCAP 123* 125* 141* 104* 130*    Recent Results (from the past 240 hour(s))  Culture, blood (Routine X 2) w Reflex  to ID Panel     Status: None   Collection Time: 02/27/2016  9:52 AM  Result Value Ref Range Status   Specimen Description BLOOD RIGHT HAND  Final   Special Requests IN PEDIATRIC BOTTLE  3CC  Final   Culture NO GROWTH 5 DAYS  Final   Report Status 02/25/2016 FINAL  Final  Culture, blood (Routine X 2) w Reflex to ID Panel     Status: None   Collection Time: 02/15/2016  9:56 AM  Result Value Ref Range Status   Specimen Description BLOOD RIGHT HAND  Final   Special Requests IN PEDIATRIC BOTTLE  2CC  Final   Culture NO GROWTH 5 DAYS  Final   Report Status 02/25/2016 FINAL  Final     Scheduled Meds: . antiseptic oral rinse  7 mL Mouth Rinse QID  . aspirin  81 mg Per Tube Daily  . atorvastatin  80 mg Per Tube q1800  . chlorhexidine gluconate (SAGE KIT)  15 mL Mouth Rinse BID  . [START ON 02/28/2016] darbepoetin (ARANESP) injection - DIALYSIS  200 mcg Intravenous Q Wed-HD  . feeding supplement (VITAL AF 1.2 CAL)  1,000 mL Per Tube Q24H  . ferric gluconate (FERRLECIT/NULECIT) IV  125 mg Intravenous Q M,W,F-HD  . heparin subcutaneous  5,000 Units Subcutaneous Q8H  . insulin aspart  0-9 Units Subcutaneous Q4H  . insulin glargine  10 Units Subcutaneous BID  . isosorbide dinitrate  10 mg Per Tube TID  . metoprolol tartrate  25 mg Per Tube BID  . metroNIDAZOLE  500 mg Per Tube TID  . multivitamin  1 tablet Oral QHS  . pantoprazole sodium  40 mg Per Tube Daily    Continuous Infusions: . sodium chloride 10 mL/hr at 02/25/16 0459     LOS: 11 days    Cherene Altes, MD [facility] Office  [phone] Pager - Text Page per Shea Evans as per below:  On-Call/Text Page:      [email or web address]      password [email or web address]  If 7PM-7AM, please contact night-coverage [email or web address] Password [facility] 02/27/2016, 3:26 PM

## 2016-02-27 NOTE — Progress Notes (Signed)
Subjective: Interval History: has no complaint, but not coop or coherent.  Objective: Vital signs in last 24 hours: Temp:  [97.8 F (36.6 C)-99.7 F (37.6 C)] 98.3 F (36.8 C) (07/25 0500) Pulse Rate:  [83-98] 83 (07/25 0345) Resp:  [4-26] 24 (07/25 0500) BP: (133-161)/(67-89) 146/73 (07/25 0500) SpO2:  [98 %-100 %] 100 % (07/25 0500) FiO2 (%):  [28 %] 28 % (07/25 0345) Weight:  [57.5 kg (126 lb 12.2 oz)-59.4 kg (130 lb 15.3 oz)] 59.4 kg (130 lb 15.3 oz) (07/25 0320) Weight change: 0.029 kg (1 oz)  Intake/Output from previous day: 07/24 0701 - 07/25 0700 In: 1630 [Blood:330; NG/GT:1300] Out: -110  Intake/Output this shift: No intake/output data recorded.  General appearance: no distress, pale and not purposeful interaction Neck: trach Resp: diminished breath sounds bilaterally, rales bibasilar and rhonchi bibasilar Cardio: friction rub heard at apex, 3 component GI: soft, non-tender; bowel sounds normal; no masses,  no organomegaly Extremities: AVF LUA some swelling , good B&T  Lab Results:  Recent Labs  02/26/16 0455 02/26/16 1711  WBC 13.1* 10.8*  HGB 7.3* 7.1*  HCT 24.2* 23.1*  PLT 111* 112*   BMET:  Recent Labs  02/25/16 0300 02/26/16 1406  NA 139 136  K 4.2 4.3  CL 104 102  CO2 26 23  GLUCOSE 189* 137*  BUN 43* 70*  CREATININE 3.22* 4.37*  CALCIUM 7.7* 7.5*   No results for input(s): PTH in the last 72 hours. Iron Studies: No results for input(s): IRON, TIBC, TRANSFERRIN, FERRITIN in the last 72 hours.  Studies/Results: Dg Abd Portable 1v  Result Date: 02/26/2016 CLINICAL DATA:  Feeding tube dysfunction. EXAM: PORTABLE ABDOMEN - 1 VIEW COMPARISON:  02/24/2016. FINDINGS: Feeding tube noted with tip in the stomach. Soft tissue structures are unremarkable. No bowel distention. Surgical clips in the pelvis. No acute bony abnormality. IMPRESSION: 1. Feeding tube noted with tip in the stomach. 2. No acute intra-abdominal abnormality. Electronically Signed    By: Maisie Fus  Register   On: 02/26/2016 07:05   I have reviewed the patient's current medications.  Assessment/Plan: 1 ESRD had infilt of avf yest, to attempt today.  Will not use hep with pericarditis..  Some vol xs 2 Anemia on esa/Fe to get blood 3 HPTH 4 AMS not coop ,?? etio , chronic 5 MS 6 Pneu resolving 7 Pericarditis ?? Why infx vs other P HD, no hep ,cont esa, TF    LOS: 11 days   Tanner Campbell L 02/27/2016,7:19 AM

## 2016-02-27 NOTE — Evaluation (Signed)
Clinical/Bedside Swallow Evaluation Patient Details  Name: Tanner Campbell MRN: [id number] Date of Birth: 04-05-1976  Today's Date: 02/27/2016 Time: SLP Start Time (ACUTE ONLY): 1015 SLP Stop Time (ACUTE ONLY): 1035 SLP Time Calculation (min) (ACUTE ONLY): 20 min  Past Medical History:  Past Medical History:  Diagnosis Date  . COPD (chronic obstructive pulmonary disease) (HCC)   . Diabetes mellitus without complication (HCC)   . Hemodialysis patient (HCC)   . High cholesterol   . Hypertension   . Neuropathy (HCC)   . Renal disorder    Past Surgical History:  Past Surgical History:  Procedure Laterality Date  . BACK SURGERY    . CHOLECYSTECTOMY    . NECK SURGERY    . TEE WITHOUT CARDIOVERSION N/A 02/25/2016   Procedure: TRANSESOPHAGEAL ECHOCARDIOGRAM (TEE);  Surgeon: Thurmon Fair, MD;  Location: [facility] ENDOSCOPY;  Service: Cardiovascular;  Laterality: N/A;  . TRACHEOSTOMY TUBE PLACEMENT N/A 02/12/2016   Procedure: TRACHEOSTOMY;  Surgeon: Newman Pies, MD;  Location: [facility] OR;  Service: ENT;  Laterality: N/A;   HPI:  40 year old male admitted 2016/02/17 due to recurrent sepsis, failure to wean, multiple intubations. PMH significant forDM2, HTN, HLD, ESRD on HD, COPD. Trach placed 02/14/2016. Orders received for PMSV evaluation and BSE. Will hold BSE at this time, as pt is currently NPO for TEE later today.   Assessment / Plan / Recommendation Clinical Impression  With PMV in place, clinical swallowing eval completed.  Pt demonstrated adequate attention/anticipation of bolus, active mastication of trial ice chips, and palpable swallowing response.  He verbalized desire to begin eating.  There were no changes in VS during PO intake.  Given multiple significant risk factors for dysphagia - multiple and prolonged intubations, one self-extubation, poor quality phonation and weak cough - recommend proceeding with FEES to determine readiness for POs.  D/W sister, who signed the consent form.  Pt preparing for  transport to HD; therefore, will schedule FEES for next date.  Pt, sister, RN agree.      Aspiration Risk  Severe aspiration risk    Diet Recommendation   NPO       Other  Recommendations     Follow up Recommendations   (tba)    Frequency and Duration min 3x week  2 weeks       Prognosis Prognosis for Safe Diet Advancement: Fair      Swallow Study   General Date of Onset: 02-17-16 HPI: 40 year old male admitted 02-17-16 due to recurrent sepsis, failure to wean, multiple intubations. PMH significant forDM2, HTN, HLD, ESRD on HD, COPD. Trach placed 03/04/2016. Orders received for PMSV evaluation and BSE. Will hold BSE at this time, as pt is currently NPO for TEE later today. Type of Study: Bedside Swallow Evaluation Diet Prior to this Study: NPO;NG Tube Temperature Spikes Noted: No Respiratory Status: Trach;Trach Collar Trach Size and Type: Uncuffed;#6 History of Recent Intubation: Yes Date extubated:  (see hx) Behavior/Cognition: Alert;Cooperative Oral Cavity Assessment: Within Functional Limits Oral Care Completed by SLP: No Oral Cavity - Dentition: Adequate natural dentition Vision: Functional for self-feeding Self-Feeding Abilities: Total assist Patient Positioning: Upright in bed Baseline Vocal Quality: Hoarse;Low vocal intensity (pmv in place) Volitional Cough: Weak;Congested Volitional Swallow: Able to elicit    Oral/Motor/Sensory Function Overall Oral Motor/Sensory Function: Within functional limits   Ice Chips Ice chips: Within functional limits   Thin Liquid Thin Liquid: Not tested    Nectar Thick Nectar Thick Liquid: Not tested   Honey Thick Honey Thick  Liquid: Not tested   Puree Puree: Not tested   Solid   GO   Solid: Not tested       Tanner Campbell L. Tanner Campbell, [place] CCC/SLP Pager [phone]  Tanner Campbell Tanner Campbell 02/27/2016,11:23 AM

## 2016-02-27 NOTE — Progress Notes (Signed)
Dialysis treatment terminated with 1 hour and 35 minutes left in treatment due to infiltration of venous access site.  Nephrology notified.  Plan to run patient on 02/28/16.    2764 mL ultrafiltrated and net fluid removal 2514 mL.    Patient status unchanged. Lung sounds clear, diminished to ausculation in all fields. Generalized edema. Cardiac: NSR.  Disconnected lines and removed needles.  Pressure held for 10 minutes and band aid/gauze dressing applied.  Report given to bedside RN, Gloriajean Dell.  Unable to return 200 mL of patient blood post treatment due to clotted dialyzer.

## 2016-02-27 NOTE — Progress Notes (Signed)
Physical Therapy Treatment Patient Details Name: Tanner Campbell MRN: [id number] DOB: 1975-09-30 Today's Date: 02/27/2016    History of Present Illness Tanner Campbell is an 40 y.o. 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 He was intubated 6/23-7/5 (self extubated), 7/10-7/16 (self extubated), now reintubated. Initially, sputum was positive for H influenza and subsequently for MSSA and Escherichia coli and blood cultures growing a gram-negative anaerobic organism. Thoracentesis on right showed borderline exudate by LDH criteria. [name] 7/18; NG tube feedings    PT Comments    Patient alert on arrival. Making good eye contact. Waved with Rt hand when PT said, "hello!" Following commands for exercises x 4 extremities and bed mobility (with delay). VSS on trach collar 28%. Will need +2 next visit for attempting EOB/mobility.    Follow Up Recommendations  Supervision/Assistance - 24 hour;SNF     Equipment Recommendations  Other (comment) (TBA)    Recommendations for Other Services       Precautions / Restrictions Precautions Precautions: Fall Restrictions Weight Bearing Restrictions: No    Mobility  Bed Mobility Overal bed mobility: Needs Assistance Bed Mobility: Rolling Rolling: Mod assist         General bed mobility comments: rolling Rt, lt, assisted pull up to [facility] (+2 assist); up to chair position as MD & SLP entered for trach change  Transfers                    Ambulation/Gait                 Stairs            Wheelchair Mobility    Modified Rankin (Stroke Patients Only)       Balance     Sitting balance-Leahy Scale: Poor Sitting balance - Comments: Pt needed mod to min assist to sit EOB for 5 min.  HAd trouble breathing therefore did not sit too long.  Sats on 28% trach collar  84-87%.  Recovered once pt was laid backto bed.                             Cognition Arousal/Alertness: Awake/alert Behavior During Therapy: Flat affect Overall Cognitive Status: No family/caregiver present to determine baseline cognitive functioning Area of Impairment: Attention;Following commands;Orientation;Problem solving Orientation Level: Place;Time;Situation Current Attention Level: Sustained Memory: Decreased short-term memory (did not recall in hospital after 30 sec) Following Commands: Follows one step commands with increased time     Problem Solving: Slow processing;Decreased initiation;Difficulty sequencing;Requires verbal cues;Requires tactile cues      Exercises General Exercises - Upper Extremity Shoulder Flexion: AAROM;Both;5 reps Elbow Flexion: AROM;Strengthening;Both;5 reps Elbow Extension: AROM;Strengthening;Both;5 reps General Exercises - Lower Extremity Ankle Circles/Pumps: AROM;Both;10 reps Quad Sets: AROM;Both;5 reps Heel Slides: AROM;Strengthening;Both;5 reps (resisted extension)    General Comments        Pertinent Vitals/Pain Pain Assessment: Faces Faces Pain Scale: No hurt    Home Living                      Prior Function            PT Goals (current goals can now be found in the care plan section) Acute Rehab PT Goals Patient Stated Goal: unable to state Time For  Goal Achievement: 03/05/16 Progress towards PT goals: Progressing toward goals    Frequency  Min 2X/week    PT Plan Discharge plan needs to be updated (CIR denied)    Co-evaluation             End of Session Equipment Utilized During Treatment: Oxygen (trach) Activity Tolerance: Patient tolerated treatment well Patient left: in bed;with bed alarm set;with SCD's reapplied (SLP aware to re-apply bil mitts; bed alarm)     Time: 8341-9622 PT Time Calculation (min) (ACUTE ONLY): 28 min  Charges:  $Therapeutic Exercise: 23-37 mins                     G Codes:      Lexandra Rettke 2016/03/22, 10:23 AM  Pager [phone]

## 2016-02-27 NOTE — Progress Notes (Signed)
Speech Language Pathology Treatment: Hillary Bow Speaking valve  Patient Details Name: Micky Sheller MRN: [id number] DOB: 02-26-1976 Today's Date: 02/27/2016 Time: 1000-1015 SLP Time Calculation (min) (ACUTE ONLY): 15 min  Assessment / Plan / Recommendation Clinical Impression  F/u for PMV use and toleration. Dr. Suszanne Conners present and changed trach from #6 cuffed to cuffless.  PMV placed; pt with improved efforts to initiate verbal communication.  Quality of voice is hoarse, weak, wet with low volume, requiring mod cues to increase respiratory support.  Pt likely with some laryngeal trauma s/p multiple intubations and at least one self-extubation.  VS remained stable with oxygenation >95% and stable RR/HR.  Pt's family arrived during session, which motivated pt to communicate.  Recommend pt use PMV during all waking hours with frequent intermittent supervision initially.  D/W RN.     HPI HPI: 40 year old male admitted 02/29/2016 due to recurrent sepsis, failure to wean, multiple intubations. PMH significant forDM2, HTN, HLD, ESRD on HD, COPD. Trach placed 03/01/2016. Orders received for PMSV evaluation and BSE. Will hold BSE at this time, as pt is currently NPO for TEE later today.      SLP Plan  Continue with current plan of care     Recommendations   Use PMV during all waking hours with frequent, intermittent supervision.             Plan: Continue with current plan of care     GO                Blenda Mounts Laurice 02/27/2016, 11:06 AM

## 2016-02-27 NOTE — Procedures (Signed)
I was present at this session.  I have reviewed the session itself and made appropriate changes. 17 ga needles in avf.  Lower flows, tol well, vol xs.  No hep with pericarditis.  Tonya Carlile L 7/25/201712:08 PM

## 2016-02-27 NOTE — Progress Notes (Signed)
Subjective: No new issues. On trach collar.  Objective: Vital signs in last 24 hours: Temp:  [97.8 F (36.6 C)-99 F (37.2 C)] 98 F (36.7 C) (07/25 1121) Pulse Rate:  [83-99] 93 (07/25 1252) Resp:  [17-26] 17 (07/25 1121) BP: (133-167)/(67-89) 141/80 (07/25 1252) SpO2:  [10 %-100 %] 100 % (07/25 0815) FiO2 (%):  [28 %] 28 % (07/25 0815) Weight:  [126 lb 12.2 oz (57.5 kg)-134 lb 4.2 oz (60.9 kg)] 134 lb 4.2 oz (60.9 kg) (07/25 1121)  Physical Exam: General: Sleeping. NAD. On trach collar. Ears: Normal auricles and EACs. Nose: Normal mucosa. Oral: Normal mucosa. No lesion. Neck: Trach in place. No bleeding.  Abdomen: Soft, non tender. Skin: No rashes   Recent Labs  02/27/16 0918 02/27/16 1130  WBC 9.4 9.2  HGB 8.2* 7.8*  HCT 26.3* 24.5*  PLT 115* 116*    Recent Labs  02/26/16 1406 02/27/16 1130  NA 136 138  K 4.3 4.8  CL 102 100*  CO2 23 25  GLUCOSE 137* 119*  BUN 70* 69*  CREATININE 4.37* 4.17*  CALCIUM 7.5* 7.7*    Medications:  I have reviewed the patient's current medications. Scheduled: . antiseptic oral rinse  7 mL Mouth Rinse QID  . aspirin  81 mg Per Tube Daily  . atorvastatin  80 mg Per Tube q1800  . chlorhexidine gluconate (SAGE KIT)  15 mL Mouth Rinse BID  . [START ON 02/28/2016] darbepoetin (ARANESP) injection - DIALYSIS  200 mcg Intravenous Q Wed-HD  . feeding supplement (VITAL AF 1.2 CAL)  1,000 mL Per Tube Q24H  . ferric gluconate (FERRLECIT/NULECIT) IV  125 mg Intravenous Q M,W,F-HD  . heparin  40 Units/kg Dialysis Once in dialysis  . heparin subcutaneous  5,000 Units Subcutaneous Q8H  . insulin aspart  0-9 Units Subcutaneous Q4H  . insulin glargine  10 Units Subcutaneous BID  . isosorbide dinitrate  10 mg Per Tube TID  . metoprolol tartrate  25 mg Per Tube BID  . metroNIDAZOLE  500 mg Per Tube TID  . multivitamin  1 tablet Oral QHS  . pantoprazole sodium  40 mg Per Tube Daily   TWK:MQKMMN chloride, sodium chloride, alteplase,  heparin, heparin, lidocaine (PF), lidocaine-prilocaine, metoprolol, nitroGLYCERIN, oxyCODONE, pentafluoroprop-tetrafluoroeth, sennosides  Assessment/Plan: POD #7 s/p trach. Off vent. Trach changed today to an uncuffed #6 Shiley.  Pt with pneumonia and respiratory failure. Routine trach care. Will sign off.   LOS: 11 days   Ellinor Test,SUI W 02/27/2016, 12:56 PM

## 2016-02-27 NOTE — Progress Notes (Signed)
PULMONARY / CRITICAL CARE MEDICINE   Name: Tanner Campbell MRN: [id number] DOB: 04/19/1976    ADMISSION DATE:  02/22/2016  REFERRING MD:  Laren Everts -->McClung   CHIEF COMPLAINT:  Prolonged critical illness, failure to wean, recurrent sepsis. PCCM following for trach care.   HPI 40 year old male w/ ESRD whom we initially admitted to [facility] from [facility] where he was admitted for ongoing care for PNA and AECOPD. His course was complicated by: HCAP, AECOPD, bacteremia (veillonella species), NSTEMI, pericardial friction rub, GIB, and acute encephalopathy and now severe physical deconditioning and protein calorie malnutrition. Was transferred to [facility] per family request & on-going care. Eventually underwent trach on 7/18 was liberated from the vent and transferred to the IM service. He has been making slow progress. His trach now changed to 6 cuffless and ENT has singed off. PCCM was asked to assist w/ vent trach management and timing of decannulation.   Past Medical History:  Diagnosis Date  . COPD (chronic obstructive pulmonary disease) ([place])   . Diabetes mellitus without complication ([place])   . Hemodialysis patient ([place])   . High cholesterol   . Hypertension   . Neuropathy ([place])   . Renal disorder    Past Surgical History:  Procedure Laterality Date  . BACK SURGERY    . CHOLECYSTECTOMY    . NECK SURGERY    . TEE WITHOUT CARDIOVERSION N/A 02/17/2016   Procedure: TRANSESOPHAGEAL ECHOCARDIOGRAM (TEE);  Surgeon: Sanda Klein, MD;  Location: [place];  Service: Cardiovascular;  Laterality: N/A;  . TRACHEOSTOMY TUBE PLACEMENT N/A 02/05/2016   Procedure: TRACHEOSTOMY;  Surgeon: Leta Baptist, MD;  Location: [facility] OR;  Service: ENT;  Laterality: N/A;   Social History   Social History  . Marital status: Single    Spouse name: N/A  . Number of children: N/A  . Years of education: N/A   Occupational History  . disabled    Social History Main Topics  . Smoking status: Current Every Day Smoker  .  Smokeless tobacco: Not on file  . Alcohol use No  . Drug use: No  . Sexual activity: Not on file   Other Topics Concern  . Not on file   Social History Narrative  . No narrative on file   Family History  Problem Relation Age of Onset  . Rheum arthritis Neg Hx   . Osteoarthritis Neg Hx   . Asthma Neg Hx   . Cancer Neg Hx   . Diabetes Neg Hx    Allergies  Allergen Reactions  . Penicillins Other (See Comments)    Reaction: Unknown   Scheduled Meds: . antiseptic oral rinse  7 mL Mouth Rinse QID  . aspirin  81 mg Per Tube Daily  . atorvastatin  80 mg Per Tube q1800  . chlorhexidine gluconate (SAGE KIT)  15 mL Mouth Rinse BID  . [START ON 02/28/2016] darbepoetin (ARANESP) injection - DIALYSIS  200 mcg Intravenous Q Wed-HD  . feeding supplement (VITAL AF 1.2 CAL)  1,000 mL Per Tube Q24H  . ferric gluconate (FERRLECIT/NULECIT) IV  125 mg Intravenous Q M,W,F-HD  . heparin subcutaneous  5,000 Units Subcutaneous Q8H  . insulin aspart  0-9 Units Subcutaneous Q4H  . insulin glargine  10 Units Subcutaneous BID  . isosorbide dinitrate  10 mg Per Tube TID  . metoprolol tartrate  25 mg Per Tube BID  . metroNIDAZOLE  500 mg Per Tube TID  . multivitamin  1 tablet Oral QHS  . pantoprazole sodium  40  mg Per Tube Daily   Continuous Infusions: . sodium chloride 10 mL/hr at 02/25/16 0459   PRN Meds:.metoprolol, morphine injection, nitroGLYCERIN, oxyCODONE, sennosides   ROS Unable    SUBJECTIVE:  Doing well w/ ATC.   VITAL SIGNS: BP 136/74   Pulse 92   Temp 98.5 F (36.9 C)   Resp 16   Ht 5' 5"  (1.651 m)   Wt 128 lb 12 oz (58.4 kg)   SpO2 98%   BMI 21.42 kg/m   VENTILATOR SETTINGS: FiO2 (%):  [28 %] 28 %  INTAKE / OUTPUT: I/O last 3 completed shifts: In: 2548.4 [I.V.:223.4; Blood:330; AL/PF:7902] Out: -110   PHYSICAL EXAMINATION: General: Awake, alert, in NAD Neuro: Follows commands, oriented currently, profoundly weak, can't lift arms or legs against gravity.   HEENT: Trach in place, 6 cuffless. PMV voice quality is poor Cardiac: regular, friction rub present Chest: normal work of breathing, scattered rhonchi.  Abd: soft, non tender Ext: no edema, decreased muscle bulk Skin: multiple areas of healing excoriations on the lower extremities  LABS:  BMET  Recent Labs Lab 02/25/16 0300 02/26/16 1406 02/27/16 1130  NA 139 136 138  K 4.2 4.3 4.8  CL 104 102 100*  CO2 26 23 25   BUN 43* 70* 69*  CREATININE 3.22* 4.37* 4.17*  GLUCOSE 189* 137* 119*   CBC  Recent Labs Lab 02/26/16 1711 02/27/16 0918 02/27/16 1130  WBC 10.8* 9.4 9.2  HGB 7.1* 8.2* 7.8*  HCT 23.1* 26.3* 24.5*  PLT 112* 115* 116*    STUDIES:  7/13 Echo >> EF 40 to 45%, mod LVH, small pericardial effusion 7/17 CXR >> stable bilateral pleural effusions, stable patchy opacities in the lung bases- pneumonia vs atelectasis ANA neg  CULTURES: BCX2 7/10: Veillonella species (Gram negative coccobacilli) resp culture 7/10: Moderate SA and E.coli (both pan sensitive)  ANTIBIOTICS: cipro 7/12>>>7/14 vanc 7/2>>>7/17 Cefepime 7/14>>>7/21 Fluconazole 7/7>>>7/17 Flagyl 7/17 >>  SIGNIFICANT EVENTS: 7/14 Transfer from [facility] to [facility] 7/18 Trach placed 7/22 off vent to SDU. 7/25 PCCM asked to assist w/ trach care    LINES/TUBES: OETT 7/10>>>7/17; 7/17 >>> Right IJ PICC (? 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  ->he has now been in the SDU setting since 7/22. Clinically he is making progress. Now off vent. Requiring 28%, and using PMV. The challenge at this point will be the trach in the setting of ESRD. His speech quality remains poor and cough mechanics are still weak. At this point it would not be safe or in his best interest to decannulate. Hopefully he will eventually get to this point but it appears to be a long way away.   Plan Continue to work w/ SLP for improved voicee quality and swallowing Continue aggressive PT (he is profoundly weak and cannot even lift his legs  against gravity) Continue routine trach care.  When he is stronger we can initiate trach capping trials to assess for decannulation but this is a ways away.   Erick Colace ACNP-BC [place] Pager # [phone] OR # [phone] if no answer  STAFF NOTE: I, Merrie Roof, MD FACP have personally reviewed patient's available data, including medical history, events of note, physical examination and test results as part of my evaluation. I have discussed with resident/NP and other care providers such as pharmacist, RN and RRT. In addition, I personally evaluated patient and elicited key findings of: awake, weak, poor cough, secretions thick moderate, ronchi bases, he is profoundly weak, and deconditioned, I see he used PMV x 20 min ( not surprised), we are likely weeks to a month from decannulation if at all soon, his cough is horribly weak , we need to follow secretions status and escalate pmv as able, keep 6 cuffless for now, monitor pcxr int for atx and infiltrate bases, keep attempting pmv daily but would again not escalate for hours until secretions reduce, PT is crucial to  improve his strength, will follow twice per week for further recs   Lavon Paganini. Titus Mould, MD, [place] Pgr: [facility] 02/27/2016 5:06 PM

## 2016-02-27 NOTE — Progress Notes (Signed)
Patient arrived to unit per bed.  Reviewed treatment plan and this RN agrees.  Report received from bedside RN, Nadine.  Consent verified.  Patient A & O . Lung sounds clear, diminished to ausculation in all fields. Generalized edema. Cardiac: NSR.  Prepped LUAVF with alcohol and cannulated with two 17 gauge needles.  Pulsation of blood noted.  Flushed access well with saline per protocol.  Connected and secured lines and initiated tx at 1139.  UF goal of 4500 mL and net fluid removal of 4000 mL.  Will continue to monitor.

## 2016-02-28 ENCOUNTER — Inpatient Hospital Stay (HOSPITAL_COMMUNITY): Payer: Medicare Other

## 2016-02-28 DIAGNOSIS — A048 Other specified bacterial intestinal infections: Secondary | ICD-10-CM | POA: Diagnosis present

## 2016-02-28 DIAGNOSIS — D638 Anemia in other chronic diseases classified elsewhere: Secondary | ICD-10-CM | POA: Diagnosis present

## 2016-02-28 DIAGNOSIS — I3139 Other pericardial effusion (noninflammatory): Secondary | ICD-10-CM | POA: Diagnosis present

## 2016-02-28 DIAGNOSIS — D631 Anemia in chronic kidney disease: Secondary | ICD-10-CM

## 2016-02-28 DIAGNOSIS — I313 Pericardial effusion (noninflammatory): Secondary | ICD-10-CM | POA: Diagnosis present

## 2016-02-28 DIAGNOSIS — J90 Pleural effusion, not elsewhere classified: Secondary | ICD-10-CM

## 2016-02-28 DIAGNOSIS — N186 End stage renal disease: Secondary | ICD-10-CM

## 2016-02-28 DIAGNOSIS — D62 Acute posthemorrhagic anemia: Secondary | ICD-10-CM | POA: Diagnosis present

## 2016-02-28 DIAGNOSIS — N189 Chronic kidney disease, unspecified: Secondary | ICD-10-CM

## 2016-02-28 DIAGNOSIS — E43 Unspecified severe protein-calorie malnutrition: Secondary | ICD-10-CM | POA: Diagnosis present

## 2016-02-28 DIAGNOSIS — Z992 Dependence on renal dialysis: Secondary | ICD-10-CM

## 2016-02-28 LAB — RENAL FUNCTION PANEL
ALBUMIN: 1.7 g/dL — AB (ref 3.5–5.0)
Anion gap: 10 (ref 5–15)
BUN: 50 mg/dL — ABNORMAL HIGH (ref 6–20)
CALCIUM: 7.7 mg/dL — AB (ref 8.9–10.3)
CO2: 27 mmol/L (ref 22–32)
CREATININE: 3.23 mg/dL — AB (ref 0.61–1.24)
Chloride: 98 mmol/L — ABNORMAL LOW (ref 101–111)
GFR, EST AFRICAN AMERICAN: 26 mL/min — AB (ref >60)
GFR, EST NON AFRICAN AMERICAN: 22 mL/min — AB (ref >60)
Glucose, Bld: 99 mg/dL (ref 65–99)
PHOSPHORUS: 3.9 mg/dL (ref 2.5–4.6)
Potassium: 4.7 mmol/L (ref 3.5–5.1)
SODIUM: 135 mmol/L (ref 135–145)

## 2016-02-28 LAB — BASIC METABOLIC PANEL
ANION GAP: 10 (ref 5–15)
BUN: 51 mg/dL — ABNORMAL HIGH (ref 6–20)
CHLORIDE: 97 mmol/L — AB (ref 101–111)
CO2: 27 mmol/L (ref 22–32)
CREATININE: 3.27 mg/dL — AB (ref 0.61–1.24)
Calcium: 7.7 mg/dL — ABNORMAL LOW (ref 8.9–10.3)
GFR calc non Af Amer: 22 mL/min — ABNORMAL LOW (ref >60)
GFR, EST AFRICAN AMERICAN: 25 mL/min — AB (ref >60)
Glucose, Bld: 98 mg/dL (ref 65–99)
Potassium: 4.7 mmol/L (ref 3.5–5.1)
Sodium: 134 mmol/L — ABNORMAL LOW (ref 135–145)

## 2016-02-28 LAB — CBC
HEMATOCRIT: 25.8 % — AB (ref 39.0–52.0)
Hemoglobin: 7.9 g/dL — ABNORMAL LOW (ref 13.0–17.0)
MCH: 27.4 pg (ref 26.0–34.0)
MCHC: 30.6 g/dL (ref 30.0–36.0)
MCV: 89.6 fL (ref 78.0–100.0)
PLATELETS: 138 10*3/uL — AB (ref 150–400)
RBC: 2.88 MIL/uL — AB (ref 4.22–5.81)
RDW: 18.8 % — ABNORMAL HIGH (ref 11.5–15.5)
WBC: 11 10*3/uL — AB (ref 4.0–10.5)

## 2016-02-28 LAB — GLUCOSE, CAPILLARY
GLUCOSE-CAPILLARY: 91 mg/dL (ref 65–99)
GLUCOSE-CAPILLARY: 119 mg/dL — AB (ref 65–99)
Glucose-Capillary: 99 mg/dL (ref 65–99)
Glucose-Capillary: 130 mg/dL — ABNORMAL HIGH (ref 65–99)
Glucose-Capillary: 111 mg/dL — ABNORMAL HIGH (ref 65–99)

## 2016-02-28 LAB — SEDIMENTATION RATE: SED RATE: 90 mm/h — AB (ref 0–16)

## 2016-02-28 LAB — C-REACTIVE PROTEIN: CRP: 3.1 mg/dL — ABNORMAL HIGH (ref <1.0)

## 2016-02-28 LAB — MAGNESIUM: Magnesium: 1.9 mg/dL (ref 1.7–2.4)

## 2016-02-28 MED ORDER — ACETYLCYSTEINE 20 % IN SOLN
4.0000 mL | Freq: Four times a day (QID) | RESPIRATORY_TRACT | Status: DC
  Administered 2016-02-29 – 2016-03-03 (×11): 4 mL via RESPIRATORY_TRACT
  Filled 2016-02-28 (×16): qty 4

## 2016-02-28 MED ORDER — IPRATROPIUM-ALBUTEROL 0.5-2.5 (3) MG/3ML IN SOLN
3.0000 mL | Freq: Four times a day (QID) | RESPIRATORY_TRACT | Status: DC
  Administered 2016-02-29 – 2016-03-07 (×25): 3 mL via RESPIRATORY_TRACT
  Filled 2016-02-28 (×26): qty 3

## 2016-02-28 MED ORDER — IPRATROPIUM-ALBUTEROL 0.5-2.5 (3) MG/3ML IN SOLN
3.0000 mL | Freq: Four times a day (QID) | RESPIRATORY_TRACT | Status: DC
  Administered 2016-02-28: 3 mL via RESPIRATORY_TRACT
  Filled 2016-02-28: qty 3

## 2016-02-28 MED ORDER — ACETYLCYSTEINE 20 % IN SOLN
4.0000 mL | Freq: Four times a day (QID) | RESPIRATORY_TRACT | Status: DC
  Administered 2016-02-28: 4 mL via RESPIRATORY_TRACT
  Filled 2016-02-28 (×2): qty 4

## 2016-02-28 NOTE — Procedures (Signed)
Objective Swallowing Evaluation: Type of Study: FEES-Fiberoptic Endoscopic Evaluation of Swallow  Patient Details  Name: Tanner Campbell MRN: [id number] Date of Birth: [birth year]  Today's Date: 02/28/2016 Time: SLP Start Time (ACUTE ONLY): 0945-SLP Stop Time (ACUTE ONLY): 0953 SLP Time Calculation (min) (ACUTE ONLY): 8 min  Past Medical History:  Past Medical History:  Diagnosis Date  . COPD (chronic obstructive pulmonary disease) ([facility])   . Diabetes mellitus without complication ([facility])   . Hemodialysis patient ([facility])   . High cholesterol   . Hypertension   . Neuropathy ([facility])   . Renal disorder    Past Surgical History:  Past Surgical History:  Procedure Laterality Date  . BACK SURGERY    . CHOLECYSTECTOMY    . NECK SURGERY    . TEE WITHOUT CARDIOVERSION N/A 02/18/2016   Procedure: TRANSESOPHAGEAL ECHOCARDIOGRAM (TEE);  Surgeon: Thurmon Fair, MD;  [facility]: [facility] ENDOSCOPY;  Service: Cardiovascular;  Laterality: N/A;  . TRACHEOSTOMY TUBE PLACEMENT N/A 02/08/2016   Procedure: TRACHEOSTOMY;  Surgeon: Newman Pies, MD;  Location: [facility] OR;  Service: ENT;  Laterality: N/A;   HPI: 40 year old male admitted Mar 11, 2016 due to recurrent sepsis, failure to wean, multiple intubations. PMH significant forDM2, HTN, HLD, ESRD on HD, COPD. Trach placed 02/10/2016. Orders received for PMSV evaluation and BSE. Will hold BSE at this time, as pt is currently NPO for TEE later today.  Subjective: pt lethargic, not making attempts to communicate  Assessment / Plan / Recommendation  CHL IP CLINICAL IMPRESSIONS 02/28/2016  Therapy Diagnosis Severe pharyngeal phase dysphagia  Clinical Impression Pt has a severe pharyngeal dysphagia with diffuse presence of secretions throughout the pharynx/larynx. Pt did not produce a volitional cough despite cueing from therapist to clear, although he did exhibit a spontaneous cough that was strong enough to clear some of these standing secretions. Limited boluses administered due to  severity of dysphagia with reduced movement of the hyolaryngeal complex resulting in poor airway protection. Mostly silent aspiration occurs, although with one delayed cough noted. Residue remains throughout the pharynx as well, and mixes with secretions still present. Pt does not follow commands to cough or re-swallow to manage. Recommend to remain NPO at this time. As pt's mentation improves, he may be able to reattempt study when he can more consistently use compensatory strategies.  Impact on safety and function Severe aspiration risk      CHL IP TREATMENT RECOMMENDATION 02/28/2016  Treatment Recommendations Therapy as outlined in treatment plan below     Prognosis 02/28/2016  Prognosis for Safe Diet Advancement Fair  Barriers to Reach Goals Severity of deficits;Cognitive deficits  Barriers/Prognosis Comment --    CHL IP DIET RECOMMENDATION 02/28/2016  SLP Diet Recommendations NPO  Liquid Administration via --  Medication Administration Via alternative means  Compensations --  Postural Changes --      CHL IP OTHER RECOMMENDATIONS 02/28/2016  Recommended Consults --  Oral Care Recommendations Oral care QID  Other Recommendations Have oral suction available      CHL IP FOLLOW UP RECOMMENDATIONS 02/28/2016  Follow up Recommendations Skilled Nursing facility      [facility] IP FREQUENCY AND DURATION 02/28/2016  Speech Therapy Frequency (ACUTE ONLY) min 3x week  Treatment Duration 2 weeks           CHL IP ORAL PHASE 02/28/2016  Oral Phase WFL  Oral - Pudding Teaspoon --  Oral - Pudding Cup --  Oral - Honey Teaspoon --  Oral - Honey Cup --  Oral - Nectar Teaspoon --  Oral - Nectar Cup --  Oral - Nectar Straw --  Oral - Thin Teaspoon --  Oral - Thin Cup --  Oral - Thin Straw --  Oral - Puree --  Oral - Mech Soft --  Oral - Regular --  Oral - Multi-Consistency --  Oral - Pill --  Oral Phase - Comment --    CHL IP PHARYNGEAL PHASE 02/28/2016  Pharyngeal Phase Impaired   Pharyngeal- Pudding Teaspoon --  Pharyngeal --  Pharyngeal- Pudding Cup --  Pharyngeal --  Pharyngeal- Honey Teaspoon --  Pharyngeal --  Pharyngeal- Honey Cup --  Pharyngeal --  Pharyngeal- Nectar Teaspoon Delayed swallow initiation-pyriform sinuses;Reduced pharyngeal peristalsis;Reduced epiglottic inversion;Reduced anterior laryngeal mobility;Reduced laryngeal elevation;Reduced airway/laryngeal closure;Reduced tongue base retraction;Penetration/Aspiration before swallow;Pharyngeal residue - valleculae;Pharyngeal residue - posterior pharnyx;Lateral channel residue;Other (Comment)  Pharyngeal Material enters airway, passes BELOW cords without attempt by patient to eject out (silent aspiration)  Pharyngeal- Nectar Cup --  Pharyngeal --  Pharyngeal- Nectar Straw --  Pharyngeal --  Pharyngeal- Thin Teaspoon Delayed swallow initiation-pyriform sinuses;Reduced pharyngeal peristalsis;Reduced epiglottic inversion;Reduced anterior laryngeal mobility;Reduced laryngeal elevation;Reduced airway/laryngeal closure;Reduced tongue base retraction;Penetration/Aspiration before swallow;Pharyngeal residue - valleculae;Pharyngeal residue - posterior pharnyx;Lateral channel residue;Other (Comment)  Pharyngeal Material enters airway, passes BELOW cords without attempt by patient to eject out (silent aspiration)  Pharyngeal- Thin Cup --  Pharyngeal --  Pharyngeal- Thin Straw --  Pharyngeal --  Pharyngeal- Puree --  Pharyngeal --  Pharyngeal- Mechanical Soft --  Pharyngeal --  Pharyngeal- Regular --  Pharyngeal --  Pharyngeal- Multi-consistency --  Pharyngeal --  Pharyngeal- Pill --  Pharyngeal --  Pharyngeal Comment --     CHL IP CERVICAL ESOPHAGEAL PHASE 02/28/2016  Cervical Esophageal Phase (No Data)  Pudding Teaspoon --  Pudding Cup --  Honey Teaspoon --  Honey Cup --  Nectar Teaspoon --  Nectar Cup --  Nectar Straw --  Thin Teaspoon --  Thin Cup --  Thin Straw --  Puree --  Mechanical  Soft --  Regular --  Multi-consistency --  Pill --  Cervical Esophageal Comment --    No flowsheet data found.  Maxcine Ham 02/28/2016, 10:44 AM    Maxcine Ham, M.A. CCC-SLP [phone]

## 2016-02-28 NOTE — Progress Notes (Signed)
Subjective: Interval History: has no complaint, ot coop but awake.  Objective: Vital signs in last 24 hours: Temp:  [97.6 F (36.4 C)-98.8 F (37.1 C)] 97.6 F (36.4 C) (07/26 0331) Pulse Rate:  [76-99] 90 (07/26 0331) Resp:  [12-25] 16 (07/26 0331) BP: (105-167)/(67-83) 136/69 (07/26 0331) SpO2:  [10 %-100 %] 100 % (07/26 0311) FiO2 (%):  [28 %] 28 % (07/26 0311) Weight:  [57.5 kg (126 lb 12.2 oz)-60.9 kg (134 lb 4.2 oz)] 57.5 kg (126 lb 12.2 oz) (07/26 0500) Weight change: 3.4 kg (7 lb 7.9 oz)  Intake/Output from previous day: 07/25 0701 - 07/26 0700 In: 1380 [NG/GT:1380] Out: 2514  Intake/Output this shift: No intake/output data recorded.  General appearance: awake, but not coop or clearly coherent Neck: Trach Resp: rales bibasilar and rhonchi bibasilar Cardio: S1, S2 normal and systolic murmur: holosystolic 2/6, blowing at apex GI: soft, non-tender; bowel sounds normal; no masses,  no organomegaly Extremities: AVF LUA with swelling still patent  CV friction rub present less prominent  Lab Results:  Recent Labs  02/27/16 0918 02/27/16 1130  WBC 9.4 9.2  HGB 8.2* 7.8*  HCT 26.3* 24.5*  PLT 115* 116*   BMET:  Recent Labs  02/26/16 1406 02/27/16 1130  NA 136 138  K 4.3 4.8  CL 102 100*  CO2 23 25  GLUCOSE 137* 119*  BUN 70* 69*  CREATININE 4.37* 4.17*  CALCIUM 7.5* 7.7*   No results for input(s): PTH in the last 72 hours. Iron Studies: No results for input(s): IRON, TIBC, TRANSFERRIN, FERRITIN in the last 72 hours.  Studies/Results: No results found.  I have reviewed the patient's current medications.  Assessment/Plan: 1 ESRD will hold off HD today with infilt x 2 . Vol fair 2 Resp failure on going issue, trach not to come out 3 Nutrition TF 4 Anemia on esa, got Fe 5 MS 6HPTH  ok 7 Debill P HD in am.  SW asked about PD.  Multiple barriers.  [facility] will not allow [place] to train in hosp, cannot get back and forth to HD unit for training.     LOS: 12  days   Jazminn Pomales L 02/28/2016,7:21 AM

## 2016-02-28 NOTE — Progress Notes (Signed)
Physical Therapy Treatment Patient Details Name: Tanner Campbell MRN: [id number] DOB: 1976-05-19 Today's Date: 02/28/2016    History of Present Illness Patricia Perales is an 40 y.o. male with PMH of DM II, HTN, HLD, ESRD on HD (T, Th, S), COPD and prior back surgery who initially was admitted to [facility] from 6/21 - 6/29 for acute respiratory failure in the setting of volume overload. He was intubated 6/23-7/5 (self extubated), 7/10-7/16 (self extubated), now reintubated. Initially, sputum was positive for H influenza and subsequently for MSSA and Escherichia coli and blood cultures growing a gram-negative anaerobic organism. Thoracentesis on right showed borderline exudate by LDH criteria. Lurline Idol 7/18; NG tube feedings    PT Comments    Patient participated in 36 minute session with PT/OT, including 15+minutes sitting EOB with min assist for balance. He is following commands with long delay (>10 seconds). He occasionally becomes distracted and forgets what he was asked to do, but most of the time knows what he is working towards (just very slow processing). Noted patient's sister assisted with his care PTA--unclear what his prior functional level was.   Follow Up Recommendations  Supervision/Assistance - 24 hour;CIR     Equipment Recommendations  Other (comment) (TBA)    Recommendations for Other Services Rehab consult     Precautions / Restrictions Precautions Precautions: Fall Precaution Comments: Trach, PMSV with supervision, NG tube  Restrictions Weight Bearing Restrictions: No    Mobility  Bed Mobility Overal bed mobility: Needs Assistance Bed Mobility: Rolling;Sidelying to Sit;Sit to Sidelying Rolling: Mod assist Sidelying to sit: Mod assist;+2 for safety/equipment;HOB elevated     Sit to sidelying: Mod assist;+2 for physical assistance;+2 for safety/equipment General bed mobility comments: Rolling left to/from right for peri cleansing and to change out pad. Pt engaged in bed  mobility to sit EOB with mod assist +2 for BLE management and trunk control.   Transfers Overall transfer level: Needs assistance   Transfers: Lateral/Scoot Transfers          Lateral/Scoot Transfers: Max assist General transfer comment: used bed pad and +2 assist to come forward and weight bear on legs to scoot hip toward HOB; repeated x 2  Ambulation/Gait                 Stairs            Wheelchair Mobility    Modified Rankin (Stroke Patients Only)       Balance Overall balance assessment: Needs assistance Sitting-balance support: Bilateral upper extremity supported Sitting balance-Leahy Scale: Poor Sitting balance - Comments: Pt able to maintain static sitting EOB with min assist (+2 present for safety and line management prn).  Sat EOB x 15 minutes Postural control: Left lateral lean                          Cognition Arousal/Alertness: Awake/alert Behavior During Therapy: Flat affect Overall Cognitive Status: No family/caregiver present to determine baseline cognitive functioning Area of Impairment: Attention;Following commands;Orientation;Problem solving;Memory;Awareness Orientation Level: Place;Time;Situation Current Attention Level: Sustained Memory: Decreased short-term memory Following Commands: Follows one step commands with increased time   Awareness: Intellectual Problem Solving: Slow processing;Decreased initiation;Difficulty sequencing;Requires verbal cues;Requires tactile cues General Comments: responses very delayed (>10-15 seconds at times)    Exercises General Exercises - Lower Extremity Heel Slides:  (resisted extension)    General Comments        Pertinent Vitals/Pain Pain Assessment: No/denies pain Faces Pain Scale: No hurt Pain Location: generalized  Pain Descriptors / Indicators: Sore Pain Intervention(s): Monitored during session;Repositioned    Home Living Family/patient expects to be discharged to::  Unsure Living Arrangements: Other relatives Available Help at Discharge:  (per prior chart, sister was caregiver PTA)         Home Equipment: Other (comment) (chornic home 02) Additional Comments: HD Tues, Th, Sat.  Pt cannot answer questions and no family present to ask about home set up. Above information taken from chart.     Prior Function        Comments: Pt shook head that he ambulated PTA.   PT Goals (current goals can now be found in the care plan section) Acute Rehab PT Goals Patient Stated Goal: unable to state Time For Goal Achievement: 03/05/16 Progress towards PT goals: Progressing toward goals (3 of 4 met)    Frequency  Min 3X/week    PT Plan Discharge plan needs to be updated (CIR denied)    Co-evaluation PT/OT/SLP Co-Evaluation/Treatment: Yes Reason for Co-Treatment: Complexity of the patient's impairments (multi-system involvement);Necessary to address cognition/behavior during functional activity PT goals addressed during session: Mobility/safety with mobility;Balance;Strengthening/ROM OT goals addressed during session: ADL's and self-care;Other (comment) (functional mobility)     End of Session Equipment Utilized During Treatment: Oxygen (trach) Activity Tolerance: Patient tolerated treatment well (including use of PMV) Patient left: in bed;with bed alarm set;with SCD's reapplied;with call bell/phone within reach;with restraints reapplied (bil mitts)     Time: 0034-9179 PT Time Calculation (min) (ACUTE ONLY): 36 min  Charges:  $Therapeutic Activity: 8-22 mins                    G Codes:      Tinamarie Przybylski 03/11/2016, 11:55 AM  Pager [phone]

## 2016-02-28 NOTE — Progress Notes (Signed)
PROGRESS NOTE    Tanner Campbell  DZH:[id number] DOB: Oct 06, 1975 DOA: 02/25/2016 PCP: No primary care provider on file.   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  He decompensated 6/23 and required intubation. The patient was found to have H. Influenza positive sputum and treated with Rocephin. CXR demonstrated a large right pleural effusion and he underwent a thoracentesis (1L serous fluid removed - exudative by protein). He had difficulty with weaning and was transferred to [facility] on 6/29 orally intubated for ventilator weaning. The patient developed fever and antibiotics were expanded to vancomycin, cefepime and diflucan. He had progressed to weaning on PSV x 4 hours as of 7/3. He self extubated on 7/5. Since that time he required BiPAP off and on. After HD 7/10 his mental status worsened and he became minimally responsive. CXR was consistent with RLL opacification and he was re-intubated. He was re-cultured and also found to have increased Troponin for which Cardiology was consulted. He was placed on LMWH, but had a GIB while on this w/ his hgb dropping as low as 8.4.  On 7/14 he remained ventilator dependent. His sputum cultures grew MSSA and E coli and he was noted to have + blood cultures. Because of these acutely worsening issues it was felt best to transfer him to the ICU at [facility].    Assessment & Plan:   Principal Problem:   Acute respiratory failure (HCC) Active Problems:   Sepsis ([place])   ESRD (end stage renal disease) (HCC)   Protein-calorie malnutrition, severe   Acute on chronic systolic CHF (congestive heart failure), NYHA class 3 (HCC)   NSTEMI (non-ST elevated myocardial infarction) ([place])   Bacteremia   Tracheostomy status (HCC)   End-stage renal disease on hemodialysis ([place])   Intestinal  infection due to veillonella   Pericardial effusion   Anemia in chronic kidney disease   Acute blood loss anemia   Severe malnutrition (HCC)    Acute hypoxic respiratory failure - s/p trach 7/18 - HCAP -Ventilator management and trach care per PCCM  - Completed a course of cefepime  - Endotracheal suctioning TID -DuoNeb QID -Mucomyst nebulizer QID -PCXR 7/27  Bacteremia with positive Veillonella species (Gram negative rod) -continue Flagyl x 2 weeks for bacteremia as per ID recs   Bilateral pleural effusions s/p thoracentesis Stable on follow-up chest x-ray 7/21 - no respiratory distress w/ stable oxygenation  NSTEMI vs demand ischemia -Cardiology suggest he will need an eventual cardiac cath - now off IV heparin  Small pericardial effusion with friction rub: Pericarditis? 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, Th, S) -Nephrology following   Recent GIB on LMWH No evidence of acute blood loss a this time   Anemia of chronic kidney disease + acute blood loss  No evidence of blood loss, but Hgb drifted down to 7.1  - 7/24 transfuse 1U PRBC   DM type II w/ hyperglycemia  CBG stable at this time   Acute Encephalopathy in setting of critical illness Appears to be slowly improving - attempt to avoid sedatives and follow   Severe malnutrition in context of acute illness Continue supportive  tube feeding  Disposition Planning  PT recommended CIR - CIR felt he is more appropriate for LTACH - sister does not want him to go back to [facility] because she didn't feel he was being treated well - plan is for SNF placement, but will have to be decannulated as he is ESRD and requires HD and there are no area SNFs that accept ESRD AND trach patients      DVT prophylaxis: Subcutaneous heparin Code Status: Full Family  Communication: None Disposition Plan: Vent SNF   Consultants:  PCCM Cardiology  ID Nephrology  Dr.Su [facility]  Procedures/Significant Events:  7/21 TEE;- No evidence of endocarditis.-Negative vegetation- Aortic insufficiency is seen, with a central jet. It   is likely due to degenerative changes.   7/24 transfuse 1U PRBC   Cultures 7/14 MRSA by PCR negative 7/18 blood right hand x2 pending    Antimicrobials: Cipro 7/12>7/14 Vanc 7/2>7/17 Cefepime 7/14>7/21 Fluconazole 7/7>7/17 Flagyl 7/17 >   Devices 7/25 uncuffed #6 Shiley>>   LINES / TUBES:      Continuous Infusions: . sodium chloride 10 mL/hr at 02/25/16 0459     Subjective: 7/26 alert, nods head yes and no and mouths answers appropriately to questions follows commands    Objective: Vitals:   02/28/16 0500 02/28/16 0800 02/28/16 1153 02/28/16 1604  BP:   (!) 146/71 (!) 146/73  Pulse:   98 76  Resp:   (!) 21 20  Temp:  98.3 F (36.8 C) 98.3 F (36.8 C) 98.1 F (36.7 C)  TempSrc:  Axillary Axillary Axillary  SpO2:   99% 100%  Weight: 57.5 kg (126 lb 12.2 oz)     Height:        Intake/Output Summary (Last 24 hours) at 02/28/16 1928 Last data filed at 02/28/16 0600  Gross per 24 hour  Intake              660 ml  Output                0 ml  Net              660 ml   Filed Weights   02/27/16 1121 02/27/16 1422 02/28/16 0500  Weight: 60.9 kg (134 lb 4.2 oz) 58.4 kg (128 lb 12 oz) 57.5 kg (126 lb 12.2 oz)    Examination:  General: Alert, follows commands,, positive acute respiratory distress Eyes: negative scleral hemorrhage, negative anisocoria, negative icterus ENT: Negative Runny nose, negative gingival bleeding, Neck:  Negative scars, masses, torticollis, lymphadenopathy, JVD Lungs: diffuse rhonchi, thick green sputum from trach, negative wheezes or crackles Cardiovascular: Tachycardic, Regular rhythm without murmur gallop or rub normal S1 and S2 Abdomen: negative abdominal pain,  nondistended, positive soft, bowel sounds, no rebound, no ascites, no appreciable mass Extremities: No significant cyanosis, clubbing, or edema bilateral lower extremities Skin: Negative rashes, lesions, ulcers Psychiatric:  Unable to evaluate  Central nervous system:  Cranial nerves II through XII intact, follows commands moves extremities   .     Data Reviewed: Care during the described time interval was provided by me .  I have reviewed this patient's available data, including medical history, events of note, physical examination, and all test results as part of my evaluation. I have personally reviewed and interpreted all radiology studies.  CBC:  Recent Labs Lab 02/26/16 0455 02/26/16 1711 02/27/16 0918 02/27/16 1130 02/28/16 1245  WBC 13.1* 10.8* 9.4 9.2 11.0*  HGB 7.3* 7.1* 8.2* 7.8* 7.9*  HCT 24.2* 23.1*  26.3* 24.5* 25.8*  MCV 89.0 88.5 88.3 87.8 89.6  PLT 111* 112* 115* 116* 655*   Basic Metabolic Panel:  Recent Labs Lab 02/24/16 0514 02/25/16 0300 02/26/16 1406 02/27/16 1130 02/28/16 1245  NA 138 139 136 138 135  134*  K 4.0 4.2 4.3 4.8 4.7  4.7  CL 102 104 102 100* 98*  97*  CO2 25 26 23 25 27  27   GLUCOSE 86 189* 137* 119* 99  98  BUN 26* 43* 70* 69* 50*  51*  CREATININE 2.42* 3.22* 4.37* 4.17* 3.23*  3.27*  CALCIUM 7.7* 7.7* 7.5* 7.7* 7.7*  7.7*  MG  --   --   --   --  1.9  PHOS 3.2 3.3 3.5 3.8 3.9   GFR: Estimated Creatinine Clearance: 24.2 mL/min (by C-G formula based on SCr of 3.27 mg/dL). Liver Function Tests:  Recent Labs Lab 02/24/16 0514 02/25/16 0300 02/26/16 1406 02/27/16 1130 02/28/16 1245  ALBUMIN 1.7* 1.8* 1.6* 1.6* 1.7*   No results for input(s): LIPASE, AMYLASE in the last 168 hours. No results for input(s): AMMONIA in the last 168 hours. Coagulation Profile: No results for input(s): INR, PROTIME in the last 168 hours. Cardiac Enzymes:  Recent Labs Lab 02/15/2016 1832 02/24/16 0005 02/24/16 0514 02/25/16 0300    TROPONINI 0.27* 0.32* 0.33* 0.29*   BNP (last 3 results) No results for input(s): PROBNP in the last 8760 hours. HbA1C: No results for input(s): HGBA1C in the last 72 hours. CBG:  Recent Labs Lab 02/27/16 2338 02/28/16 0330 02/28/16 0808 02/28/16 1151 02/28/16 1605  GLUCAP 118* 99 130* 91 119*   Lipid Profile: No results for input(s): CHOL, HDL, LDLCALC, TRIG, CHOLHDL, LDLDIRECT in the last 72 hours. Thyroid Function Tests: No results for input(s): TSH, T4TOTAL, FREET4, T3FREE, THYROIDAB in the last 72 hours. Anemia Panel: No results for input(s): VITAMINB12, FOLATE, FERRITIN, TIBC, IRON, RETICCTPCT in the last 72 hours. Urine analysis:    Component Value Date/Time   COLORURINE RED (A) 02/04/2016 1426   APPEARANCEUR CLOUDY (A) 02/04/2016 1426   LABSPEC 1.025 02/04/2016 1426   PHURINE 7.5 02/04/2016 1426   GLUCOSEU 500 (A) 02/04/2016 1426   HGBUR SMALL (A) 02/04/2016 1426   BILIRUBINUR SMALL (A) 02/04/2016 1426   KETONESUR NEGATIVE 02/04/2016 1426   PROTEINUR >300 (A) 02/04/2016 1426   NITRITE NEGATIVE 02/04/2016 1426   LEUKOCYTESUR TRACE (A) 02/04/2016 1426   Sepsis Labs: @LABRCNTIP (procalcitonin:4,lacticidven:4)  ) Recent Results (from the past 240 hour(s))  Culture, blood (Routine X 2) w Reflex to ID Panel     Status: None   Collection Time: 02/09/2016  9:52 AM  Result Value Ref Range Status   Specimen Description BLOOD RIGHT HAND  Final   Special Requests IN PEDIATRIC BOTTLE  3CC  Final   Culture NO GROWTH 5 DAYS  Final   Report Status 02/25/2016 FINAL  Final  Culture, blood (Routine X 2) w Reflex to ID Panel     Status: None   Collection Time: 02/22/2016  9:56 AM  Result Value Ref Range Status   Specimen Description BLOOD RIGHT HAND  Final   Special Requests IN PEDIATRIC BOTTLE  Delia  Final   Culture NO GROWTH 5 DAYS  Final   Report Status 02/25/2016 FINAL  Final         Radiology Studies: No results found.      Scheduled Meds: . acetylcysteine   4 mL Nebulization QID  . antiseptic oral rinse  7 mL Mouth Rinse  QID  . aspirin  81 mg Per Tube Daily  . atorvastatin  80 mg Per Tube q1800  . chlorhexidine gluconate (SAGE KIT)  15 mL Mouth Rinse BID  . darbepoetin (ARANESP) injection - DIALYSIS  200 mcg Intravenous Q Wed-HD  . feeding supplement (VITAL AF 1.2 CAL)  1,000 mL Per Tube Q24H  . ferric gluconate (FERRLECIT/NULECIT) IV  125 mg Intravenous Q M,W,F-HD  . heparin subcutaneous  5,000 Units Subcutaneous Q8H  . insulin aspart  0-9 Units Subcutaneous Q4H  . insulin glargine  10 Units Subcutaneous BID  . ipratropium-albuterol  3 mL Nebulization QID  . isosorbide dinitrate  10 mg Per Tube TID  . metoprolol tartrate  25 mg Per Tube BID  . metroNIDAZOLE  500 mg Per Tube TID  . multivitamin  1 tablet Oral QHS  . pantoprazole sodium  40 mg Per Tube Daily   Continuous Infusions: . sodium chloride 10 mL/hr at 02/25/16 0459     LOS: 12 days    Time spent: 40 minutes    WOODS, Geraldo Docker, MD [facility] Pager [phone]   If 7PM-7AM, please contact night-coverage [email or web address] Password [facility] 02/28/2016, 7:28 PM

## 2016-02-28 NOTE — Evaluation (Signed)
Occupational Therapy Evaluation Patient Details Name: Tanner Campbell MRN: [id number] DOB: March 16, 1976 Today's Date: 02/28/2016    History of Present Illness Tanner Campbell is an 40 y.o. 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 He was intubated 6/23-7/5 (self extubated), 7/10-7/16 (self extubated), now reintubated. Initially, sputum was positive for H influenza and subsequently for MSSA and Escherichia coli and blood cultures growing a gram-negative anaerobic organism. Thoracentesis on right showed borderline exudate by LDH criteria. [name] 7/18; NG tube feedings   Clinical Impression   Patient presenting with decreased ADL and functional mobility independence secondary to above. Unsure of patient's functional level of independence PTA. Patient currently functioning at an overall total assist to total assist +2 level. Patient will benefit from acute OT to increase overall independence in the areas of ADLs, functional mobility, and overall safety in order to safely discharge to venue listed below.     Follow Up Recommendations  CIR;Supervision/Assistance - 24 hour    Equipment Recommendations  Other (comment) (TBD)    Recommendations for Other Services  None at this time    Precautions / Restrictions Precautions Precautions: Fall;Other (comment) Precaution Comments: [name], PMSV with supervision, NG tube  Restrictions Weight Bearing Restrictions: No    Mobility Bed Mobility Overal bed mobility: Needs Assistance Bed Mobility: Rolling;Sidelying to Sit;Sit to Sidelying Rolling: Mod assist;+2 for safety/equipment Sidelying to sit: Mod assist;+2 for physical assistance;+2 for safety/equipment     Sit to sidelying: Mod assist;+2 for physical assistance;+2 for safety/equipment General bed mobility comments: Rolling left to/from  right for peri cleansing and to change out pad. Pt engaged in bed mobility to sit EOB with mod assist +2 for BLE management and trunk control.   Transfers General transfer comment: Did not occur, safety concerns at this time.     Balance Overall balance assessment: Needs assistance;History of Falls Sitting-balance support: Bilateral upper extremity supported;Feet supported Sitting balance-Leahy Scale: Fair Sitting balance - Comments: Pt able to maintain static sitting EOB with min assist (+2 present for safety and line management prn).      ADL Overall ADL's : Needs assistance/impaired General ADL Comments: Pt overall total assist for ADLs in supine to seated position. Pt seated EOB for ~15 minutes for grooming task of washing face and maintaining static and dynamic sitting.     Pertinent Vitals/Pain Pain Assessment: Faces Faces Pain Scale: Hurts little more Pain Location: generalized  Pain Descriptors / Indicators: Sore Pain Intervention(s): Monitored during session;Repositioned     Hand Dominance Right   Extremity/Trunk Assessment Upper Extremity Assessment Upper Extremity Assessment: Generalized weakness (Pt not following one step commands consistently, difficult to fully assess)   Lower Extremity Assessment Lower Extremity Assessment: Defer to PT evaluation   Cervical / Trunk Assessment Cervical / Trunk Assessment: Kyphotic   Communication Communication Communication: Passy-Muir valve   Cognition Arousal/Alertness: Awake/alert Behavior During Therapy: Flat affect Overall Cognitive Status: No family/caregiver present to determine baseline cognitive functioning Area of Impairment: Attention;Following commands;Orientation;Problem solving;Memory;Awareness Orientation Level: Place;Time;Situation;Disoriented to Current Attention Level: Sustained Memory: Decreased short-term memory Following Commands: Follows one step commands with increased time;Follows one step commands  inconsistently   Awareness: Intellectual Problem Solving: Slow processing;Decreased initiation;Difficulty sequencing;Requires verbal cues;Requires tactile cues                Home Living Family/patient expects to be  discharged to:: Unsure Living Arrangements: Other relatives Available Help at Discharge:  (per prior chart, sister was caregiver PTA) Home Equipment: Other (comment) (chornic home 02)   Additional Comments: HD Tues, Th, Sat.  Pt cannot answer questions and no family present to ask about home set up. Above information taken from chart.       Prior Functioning/Environment Comments: Pt shook head that he ambulated PTA.    OT Diagnosis: Generalized weakness;Cognitive deficits;Acute pain   OT Problem List: Decreased strength;Decreased range of motion;Decreased activity tolerance;Impaired balance (sitting and/or standing);Decreased coordination;Decreased cognition;Decreased safety awareness;Decreased knowledge of use of DME or AE;Decreased knowledge of precautions;Cardiopulmonary status limiting activity;Impaired UE functional use;Pain   OT Treatment/Interventions: Self-care/ADL training;Therapeutic exercise;Energy conservation;DME and/or AE instruction;Therapeutic activities;Cognitive remediation/compensation;Patient/family education;Balance training    OT Goals(Current goals can be found in the care plan section) Acute Rehab OT Goals Patient Stated Goal: Pt did not state  OT Goal Formulation: Patient unable to participate in goal setting Time For Goal Achievement: 03/22/2016 Potential to Achieve Goals: Good ADL Goals Pt Will Perform Grooming: with supervision;sitting Pt Will Perform Upper Body Bathing: with min assist;sitting Pt Will Transfer to Toilet: with mod assist;bedside commode;stand pivot transfer Additional ADL Goal #1: Pt will perform sit to/from stands consistently with min assist as a precursor for ADLs  Additional ADL Goal #2: Pt will engage in bed mobility  with min assist consistently as a precursor for ADLs and to decrease burden of care   OT Frequency: Min 2X/week   Barriers to D/C: Unknown at this time        Co-evaluation PT/OT/SLP Co-Evaluation/Treatment: Yes Reason for Co-Treatment: Complexity of the patient's impairments (multi-system involvement);For patient/therapist safety   OT goals addressed during session: ADL's and self-care;Other (comment) (functional mobility)      End of Session Equipment Utilized During Treatment: Oxygen  Activity Tolerance: Patient tolerated treatment well Patient left: in bed;with call bell/phone within reach;with bed alarm set   Time: 1610-9604 OT Time Calculation (min): 33 min Charges:  OT General Charges $OT Visit: 1 Procedure OT Evaluation $OT Eval High Complexity: 1 Procedure  Edwin Cap , MS, OTR/L, CLT Pager: [phone]  02/28/2016, 11:05 AM

## 2016-02-28 NOTE — Progress Notes (Signed)
Pt is sleeping comfortably at this time no distress or complications noted.  

## 2016-02-28 NOTE — Progress Notes (Signed)
RT placed PT on on new ATC set up today during 0800 rounds- uneventful.

## 2016-02-28 NOTE — Progress Notes (Signed)
Speech Language Pathology Treatment: Dysphagia;Passy Muir Speaking valve  Patient Details Name: Tanner Campbell MRN: [id number] DOB: September 11, 1975 Today's Date: 02/28/2016 Time: 2440-1027 SLP Time Calculation (min) (ACUTE ONLY): 18 min  Assessment / Plan / Recommendation Clinical Impression  Upon SLP arrival, pt was sound asleep in bed. He will arouse with stimulation, but cannot maintain this. Per RN report, pt did have some medication overnight that may be influencing his lethargy. SLP provided Max multimodal stimulation to increase level of alertness to allow for safe PO intake, with FEES planned for this morning. PMV was placed, and although pt was not following commands to initiate phonation, he wore the valve without overt signs of intolerance. Pt followed commands to cough ~50% of the time, although cough was very weak. Ice chip trials provided with Min cues for oral acceptance, followed by improved automaticity and spontaneous initiation of swallow. Given the above, recommend to proceed with FEES as planned.   HPI HPI: 40 year old male admitted 02/03/2016 due to recurrent sepsis, failure to wean, multiple intubations. PMH significant forDM2, HTN, HLD, ESRD on HD, COPD. Trach placed 03/01/2016. Orders received for PMSV evaluation and BSE. Will hold BSE at this time, as pt is currently NPO for TEE later today.      SLP Plan  Other (Comment) (FEES)     Recommendations  Diet recommendations: NPO Medication Administration: Via alternative means      Patient may use Passy-Muir Speech Valve: During all waking hours (remove during sleep) PMSV Supervision: Intermittent (close) MD: Please consider changing trach tube to : Smaller size      Oral Care Recommendations: Oral care QID Follow up Recommendations: Skilled Nursing facility Plan: Other (Comment) (FEES)     GO                Maxcine Ham 02/28/2016, 10:23 AM  Maxcine Ham, M.A. CCC-SLP [phone]

## 2016-02-29 ENCOUNTER — Inpatient Hospital Stay (HOSPITAL_COMMUNITY): Payer: Medicare Other

## 2016-02-29 DIAGNOSIS — J189 Pneumonia, unspecified organism: Secondary | ICD-10-CM | POA: Diagnosis present

## 2016-02-29 DIAGNOSIS — I319 Disease of pericardium, unspecified: Secondary | ICD-10-CM

## 2016-02-29 DIAGNOSIS — I351 Nonrheumatic aortic (valve) insufficiency: Secondary | ICD-10-CM | POA: Diagnosis present

## 2016-02-29 LAB — GLUCOSE, CAPILLARY
GLUCOSE-CAPILLARY: 107 mg/dL — AB (ref 65–99)
GLUCOSE-CAPILLARY: 114 mg/dL — AB (ref 65–99)
GLUCOSE-CAPILLARY: 120 mg/dL — AB (ref 65–99)
GLUCOSE-CAPILLARY: 44 mg/dL — AB (ref 65–99)
GLUCOSE-CAPILLARY: 55 mg/dL — AB (ref 65–99)
GLUCOSE-CAPILLARY: 65 mg/dL (ref 65–99)
GLUCOSE-CAPILLARY: 74 mg/dL (ref 65–99)
GLUCOSE-CAPILLARY: 98 mg/dL (ref 65–99)
Glucose-Capillary: 71 mg/dL (ref 65–99)

## 2016-02-29 LAB — BASIC METABOLIC PANEL
Anion gap: 11 (ref 5–15)
BUN: 59 mg/dL — AB (ref 6–20)
CHLORIDE: 93 mmol/L — AB (ref 101–111)
CO2: 30 mmol/L (ref 22–32)
CREATININE: 3.69 mg/dL — AB (ref 0.61–1.24)
Calcium: 7.9 mg/dL — ABNORMAL LOW (ref 8.9–10.3)
GFR calc Af Amer: 22 mL/min — ABNORMAL LOW (ref >60)
GFR calc non Af Amer: 19 mL/min — ABNORMAL LOW (ref >60)
Glucose, Bld: 91 mg/dL (ref 65–99)
POTASSIUM: 4.9 mmol/L (ref 3.5–5.1)
SODIUM: 134 mmol/L — AB (ref 135–145)

## 2016-02-29 LAB — CBC WITH DIFFERENTIAL/PLATELET
BASOS PCT: 0 %
Basophils Absolute: 0 10*3/uL (ref 0.0–0.1)
Eosinophils Absolute: 0.1 10*3/uL (ref 0.0–0.7)
Eosinophils Relative: 1 %
HEMATOCRIT: 23.6 % — AB (ref 39.0–52.0)
HEMOGLOBIN: 7.2 g/dL — AB (ref 13.0–17.0)
LYMPHS ABS: 1 10*3/uL (ref 0.7–4.0)
LYMPHS PCT: 10 %
MCH: 27.6 pg (ref 26.0–34.0)
MCHC: 30.5 g/dL (ref 30.0–36.0)
MCV: 90.4 fL (ref 78.0–100.0)
MONO ABS: 1.1 10*3/uL — AB (ref 0.1–1.0)
MONOS PCT: 11 %
NEUTROS ABS: 7.8 10*3/uL — AB (ref 1.7–7.7)
NEUTROS PCT: 78 %
Platelets: 163 10*3/uL (ref 150–400)
RBC: 2.61 MIL/uL — ABNORMAL LOW (ref 4.22–5.81)
RDW: 19.1 % — AB (ref 11.5–15.5)
WBC: 10 10*3/uL (ref 4.0–10.5)

## 2016-02-29 LAB — MAGNESIUM: Magnesium: 2 mg/dL (ref 1.7–2.4)

## 2016-02-29 LAB — PREPARE RBC (CROSSMATCH)

## 2016-02-29 LAB — OCCULT BLOOD X 1 CARD TO LAB, STOOL: FECAL OCCULT BLD: NEGATIVE

## 2016-02-29 MED ORDER — DEXTROSE 50 % IV SOLN
INTRAVENOUS | Status: AC
  Filled 2016-02-29: qty 50

## 2016-02-29 MED ORDER — DEXTROSE 50 % IV SOLN
INTRAVENOUS | Status: AC
  Administered 2016-02-29: 25 mL
  Filled 2016-02-29: qty 50

## 2016-02-29 MED ORDER — SODIUM CHLORIDE 0.9 % IV SOLN: Freq: Once | INTRAVENOUS | Status: DC

## 2016-02-29 MED ORDER — DARBEPOETIN ALFA 200 MCG/0.4ML IJ SOSY
200.0000 ug | PREFILLED_SYRINGE | INTRAMUSCULAR | Status: DC
  Administered 2016-02-29 – 2016-03-07 (×2): 200 ug via INTRAVENOUS
  Filled 2016-02-29 (×2): qty 0.4

## 2016-02-29 MED ORDER — OXYCODONE HCL 5 MG PO TABS
5.0000 mg | ORAL_TABLET | ORAL | Status: DC | PRN
  Administered 2016-03-02: 5 mg via ORAL
  Filled 2016-02-29: qty 1

## 2016-02-29 MED ORDER — SODIUM CHLORIDE 0.9 % IV SOLN: 100.0000 mL | INTRAVENOUS | Status: DC | PRN

## 2016-02-29 MED ORDER — ALTEPLASE 2 MG IJ SOLR: 2.0000 mg | Freq: Once | INTRAMUSCULAR | Status: DC | PRN

## 2016-02-29 MED ORDER — DEXTROSE 50 % IV SOLN
25.0000 mL | INTRAVENOUS | Status: DC | PRN
  Administered 2016-02-29: 25 mL via INTRAVENOUS

## 2016-02-29 MED ORDER — DARBEPOETIN ALFA 200 MCG/0.4ML IJ SOSY
PREFILLED_SYRINGE | INTRAMUSCULAR | Status: AC
  Filled 2016-02-29: qty 0.4

## 2016-02-29 MED ORDER — SODIUM CHLORIDE 0.9 % IV SOLN
125.0000 mg | INTRAVENOUS | Status: AC
  Administered 2016-03-02 – 2016-03-14 (×5): 125 mg via INTRAVENOUS
  Filled 2016-02-29 (×12): qty 10

## 2016-02-29 MED ORDER — MORPHINE SULFATE (PF) 2 MG/ML IV SOLN
INTRAVENOUS | Status: AC
  Administered 2016-02-29: 2 mg via INTRAVENOUS
  Filled 2016-02-29: qty 1

## 2016-02-29 MED ORDER — LIDOCAINE HCL (PF) 1 % IJ SOLN
5.0000 mL | INTRAMUSCULAR | Status: DC | PRN
Start: 2016-02-29 — End: 2016-02-29

## 2016-02-29 MED ORDER — LIDOCAINE-PRILOCAINE 2.5-2.5 % EX CREA
1.0000 "application " | TOPICAL_CREAM | CUTANEOUS | Status: DC | PRN
  Filled 2016-02-29: qty 5

## 2016-02-29 MED ORDER — PENTAFLUOROPROP-TETRAFLUOROETH EX AERO: 1.0000 "application " | INHALATION_SPRAY | CUTANEOUS | Status: DC | PRN

## 2016-02-29 MED ORDER — DEXTROSE 10 % IV SOLN
INTRAVENOUS | Status: DC
  Administered 2016-02-29 – 2016-03-01 (×2): via INTRAVENOUS

## 2016-02-29 MED ORDER — HEPARIN SODIUM (PORCINE) 1000 UNIT/ML DIALYSIS
1000.0000 [IU] | INTRAMUSCULAR | Status: DC | PRN
  Filled 2016-02-29: qty 1

## 2016-02-29 NOTE — Progress Notes (Signed)
MD woods paged about starting IV fluids since patient was not getting tube feeds due to coretrack coming out last night. Patient blood sugar was 54 at lunch and 44 at supper. Both up to WNL after 25 of IV dextrose. No new orders received at this time. Will continue to monitor patient. Tanner Campbell

## 2016-02-29 NOTE — Progress Notes (Signed)
RN spoke with MD Joseph Art about patients Coretrack coming out during night shift. Patient in hemodialysis now, MD Joseph Art to see patient when he is back from dialysis. Luther Parody Lum Keas

## 2016-02-29 NOTE — Progress Notes (Addendum)
Cortex slid out of pt's nose partially. RN advanced tube and asked for a STAT abd X-ray to check placement. X-ray stated that the cortex tube was in the appropriate position.   2345: NT and RN was giving the patient a bath. Pt began to cough and the cortex tube was found coiled in the pt's mouth. The tubing was still tightly secured on pt's nose. RN removed Cortex completely. Notified Craige Cotta, NP. Ordered to keep Cortex out and hold tube feed. Will continue to monitor.   0530: CBG 74 at 0400. Notified Craige Cotta, NP about starting dextrose IV to keep blood sugar within normal limits. No new orders at this time. Will pass on to AM RN.

## 2016-02-29 NOTE — Progress Notes (Signed)
Subjective: Interval History: has no complaint, does interact today.  Objective: Vital signs in last 24 hours: Temp:  [97.6 F (36.4 C)-98.3 F (36.8 C)] 97.6 F (36.4 C) (07/27 0450) Pulse Rate:  [76-98] 83 (07/27 0450) Resp:  [12-23] 15 (07/27 0450) BP: (144-153)/(47-73) 153/55 (07/27 0450) SpO2:  [95 %-100 %] 100 % (07/27 0450) FiO2 (%):  [28 %] 28 % (07/27 0319) Weight:  [60 kg (132 lb 4.4 oz)] 60 kg (132 lb 4.4 oz) (07/27 0450) Weight change: -0.9 kg (-1 lb 15.7 oz)  Intake/Output from previous day: 07/26 0701 - 07/27 0700 In: 660 [NG/GT:660] Out: -  Intake/Output this shift: No intake/output data recorded.  General appearance: alert and shakes head but not coop to commands Neck: Trach Resp: rales bibasilar and rhonchi bibasilar Cardio: systolic murmur: systolic ejection 2/6, decrescendo at 2nd left intercostal space and friction rub heard at apex GI: soft, non-tender; bowel sounds normal; no masses,  no organomegaly Extremities: avf LUA with hematoma  Lab Results:  Recent Labs  02/28/16 1245 02/29/16 0307  WBC 11.0* 10.0  HGB 7.9* 7.2*  HCT 25.8* 23.6*  PLT 138* 163   BMET:  Recent Labs  02/28/16 1245 02/29/16 0307  NA 135  134* 134*  K 4.7  4.7 4.9  CL 98*  97* 93*  CO2 GLUCOSE 99  98 91  BUN 50*  51* 59*  CREATININE 3.23*  3.27* 3.69*  CALCIUM 7.7*  7.7* 7.9*   No results for input(s): PTH in the last 72 hours. Iron Studies: No results for input(s): IRON, TIBC, TRANSFERRIN, FERRITIN in the last 72 hours.  Studies/Results: Dg Abd Portable 1v  Result Date: 02/28/2016 CLINICAL DATA:  Recent feeding catheter placement EXAM: PORTABLE ABDOMEN - 1 VIEW COMPARISON:  02/26/2016 FINDINGS: Scattered large and small bowel gas is noted. Feeding catheter is noted coiled within the stomach. Bibasilar infiltrative changes are noted. IMPRESSION: Feeding catheter within the stomach. Electronically Signed   By: Alcide Clever M.D.   On: 02/28/2016  22:33   I have reviewed the patient's current medications.  Assessment/Plan: 1 ESRD for HD, will try to restrain arm to avoid infilt.  Vol xs , f/u CXR 2 Anemia mildly worse use esa, review FE 3 HPTH  4 Resp failure need to get trach out when can 5 Nutrition TF out 6 Ms 7 Debill PT working with P HD, esa, mobilize, pulm care.      LOS: 13 days   Emmajean Ratledge L 02/29/2016,7:26 AM

## 2016-02-29 NOTE — Progress Notes (Signed)
Hypoglycemic Event  CBG: 44 Treatment: D50 IV 25 mL  Symptoms: Pale and Sweaty  Follow-up CBG: Time:1745 CBG Result:98  Possible Reasons for Event: Other: NPO  Comments/MD notified:Dr. Sharrie Rothman, Valentino Saxon

## 2016-02-29 NOTE — Progress Notes (Signed)
At 0809, left arm was put on restraint per MD for safety. Patient was educated and inform thatt left arm will be on restraint for safety due to his previous HD access infiltration.Will conitnue to monitor.

## 2016-02-29 NOTE — Procedures (Signed)
I was present at this session.  I have reviewed the session itself and made appropriate changes.  Bp stable. Able to stick AVF, concern for infilt.  Restrain arm with confusion  Tanner Campbell L 7/27/20179:11 AM

## 2016-02-29 NOTE — Progress Notes (Signed)
SLP Cancellation Note  Patient Tanner Campbell: Tanner Campbell MRN: [id number] DOB: [birth year]   Cancelled treatment:       Reason Eval/Treat Not Completed: Fatigue/lethargy limiting ability to participate. Per discussion with RN/MD, pt lethargic after returning from HD. Note that NGT is now out. Will hold swallow tx for today but will try again on next date to assess for PO readiness. Results of FEES reviewed with RN/MD as well.   Maxcine Ham 02/29/2016, 4:17 PM  Maxcine Ham, M.A. CCC-SLP [phone]

## 2016-02-29 NOTE — Progress Notes (Signed)
DAILY PROGRESS NOTE  Subjective:  Patient seen at dialysis. Still with come confusion, but improved. I also explained his NSTEMI and concern for underlying CAD. Troponin numbers, while elevated have been flat. LVEF was suppressed, however, recently increased - troponin elevation could also have been related to CHF/sepsis.  Objective:  Temp:  [97.6 F (36.4 C)-98.3 F (36.8 C)] 98.2 F (36.8 C) (07/27 0805) Pulse Rate:  [76-98] 82 (07/27 1000) Resp:  [12-23] 18 (07/27 1000) BP: (124-165)/(35-73) 125/35 (07/27 1000) SpO2:  [95 %-100 %] 100 % (07/27 0805) FiO2 (%):  [28 %] 28 % (07/27 0805) Weight:  [127 lb 3.3 oz (57.7 kg)-132 lb 4.4 oz (60 kg)] 127 lb 3.3 oz (57.7 kg) (07/27 0805) Weight change: -1 lb 15.7 oz (-0.9 kg)  Intake/Output from previous day: 07/26 0701 - 07/27 0700 In: 660 [NG/GT:660] Out: -   Intake/Output from this shift: No intake/output data recorded.  Medications: Current Facility-Administered Medications  Medication Dose Route Frequency Provider Last Rate Last Dose  . 0.9 %  sodium chloride infusion   Intravenous Continuous Erick Colace, NP 10 mL/hr at 02/25/16 0459    . 0.9 %  sodium chloride infusion  100 mL Intravenous PRN Mauricia Area, MD      . 0.9 %  sodium chloride infusion  100 mL Intravenous PRN Mauricia Area, MD      . acetylcysteine (MUCOMYST) 20 % nebulizer / oral solution 4 mL  4 mL Nebulization QID Allie Bossier, MD   4 mL at 02/29/16 0735  . alteplase (CATHFLO ACTIVASE) injection 2 mg  2 mg Intracatheter Once PRN Mauricia Area, MD      . antiseptic oral rinse solution (CORINZ)  7 mL Mouth Rinse QID Collene Gobble, MD   7 mL at 02/29/16 0523  . aspirin chewable tablet 81 mg  81 mg Per Tube Daily Cherene Altes, MD   81 mg at 02/28/16 1243  . atorvastatin (LIPITOR) tablet 80 mg  80 mg Per Tube q1800 Erick Colace, NP   80 mg at 02/28/16 1716  . chlorhexidine gluconate (SAGE KIT) (PERIDEX) 0.12 % solution 15 mL  15 mL Mouth Rinse  BID Collene Gobble, MD   15 mL at 02/28/16 2130  . Darbepoetin Alfa (ARANESP) injection 200 mcg  200 mcg Intravenous Q Thu-HD Mauricia Area, MD      . feeding supplement (VITAL AF 1.2 CAL) liquid 1,000 mL  1,000 mL Per Tube Q24H Rigoberto Noel, MD 60 mL/hr at 02/28/16 0533 1,000 mL at 02/28/16 0533  . ferric gluconate (NULECIT) 125 mg in sodium chloride 0.9 % 100 mL IVPB  125 mg Intravenous Q M,W,F-HD Mauricia Area, MD   125 mg at 02/27/16 1356  . heparin injection 1,000 Units  1,000 Units Dialysis PRN Mauricia Area, MD      . heparin injection 5,000 Units  5,000 Units Subcutaneous Q8H Cherene Altes, MD   5,000 Units at 02/29/16 0522  . insulin aspart (novoLOG) injection 0-9 Units  0-9 Units Subcutaneous Q4H [place], MD   1 Units at 02/28/16 [phone]  . insulin glargine (LANTUS) injection 10 Units  10 Units Subcutaneous BID Cherene Altes, MD   10 Units at 02/28/16 2146  . ipratropium-albuterol (DUONEB) 0.5-2.5 (3) MG/3ML nebulizer solution 3 mL  3 mL Nebulization QID Allie Bossier, MD   3 mL at 02/29/16 0735  . isosorbide dinitrate (ISORDIL) tablet 10 mg  10 mg Per  Tube TID Cherene Altes, MD   10 mg at 02/28/16 2140  . lidocaine (PF) (XYLOCAINE) 1 % injection 5 mL  5 mL Intradermal PRN Mauricia Area, MD      . lidocaine-prilocaine (EMLA) cream 1 application  1 application Topical PRN Mauricia Area, MD      . metoprolol (LOPRESSOR) injection 5 mg  5 mg Intravenous QID PRN Sela Hua, MD      . metoprolol tartrate (LOPRESSOR) 25 mg/10 mL oral suspension 25 mg  25 mg Per Tube BID Cherene Altes, MD   25 mg at 02/28/16 2140  . metroNIDAZOLE (FLAGYL) 50 mg/ml oral suspension 500 mg  500 mg Per Tube TID Cherene Altes, MD   500 mg at 02/28/16 2140  . morphine 2 MG/ML injection 1-2 mg  1-2 mg Intravenous Q2H PRN Cherene Altes, MD   2 mg at 02/29/16 0101  . multivitamin (RENA-VIT) tablet 1 tablet  1 tablet Oral QHS Collene Gobble, MD   1 tablet at 02/28/16 2140    . nitroGLYCERIN (NITROSTAT) SL tablet 0.4 mg  0.4 mg Sublingual Q5 min PRN Anders Simmonds, MD   0.4 mg at 02/24/2016 1747  . oxyCODONE (Oxy IR/ROXICODONE) immediate release tablet 5-10 mg  5-10 mg Oral Q4H PRN Cherene Altes, MD   5 mg at 02/28/16 2005  . pantoprazole sodium (PROTONIX) 40 mg/20 mL oral suspension 40 mg  40 mg Per Tube Daily Anders Simmonds, MD   40 mg at 02/28/16 1244  . pentafluoroprop-tetrafluoroeth (GEBAUERS) aerosol 1 application  1 application Topical PRN Mauricia Area, MD      . sennosides (SENOKOT) 8.8 MG/5ML syrup 10 mL  10 mL Oral QHS PRN Rigoberto Noel, MD   10 mL at 02/25/16 2220    Physical Exam: General appearance: alert, no distress and seen on dialysis, wrist mitts in place Lungs: clear to auscultation bilaterally Heart: regular rate and rhythm Extremities: extremities normal, atraumatic, no cyanosis or edema Neurologic: Mental status: Awake, follows commands, unable to phonate due to trach  Lab Results: Results for orders placed or performed during the hospital encounter of 02/07/2016 (from the past 48 hour(s))  Renal function panel     Status: Abnormal   Collection Time: 02/27/16 11:30 AM  Result Value Ref Range   Sodium 138 135 - 145 mmol/L   Potassium 4.8 3.5 - 5.1 mmol/L   Chloride 100 (L) 101 - 111 mmol/L   CO2 25 22 - 32 mmol/L   Glucose, Bld 119 (H) 65 - 99 mg/dL   BUN 69 (H) 6 - 20 mg/dL   Creatinine, Ser 4.17 (H) 0.61 - 1.24 mg/dL   Calcium 7.7 (L) 8.9 - 10.3 mg/dL   Phosphorus 3.8 2.5 - 4.6 mg/dL   Albumin 1.6 (L) 3.5 - 5.0 g/dL   GFR calc non Af Amer 16 (L) >60 mL/min   GFR calc Af Amer 19 (L) >60 mL/min    Comment: (NOTE) The eGFR has been calculated using the CKD EPI equation. This calculation has not been validated in all clinical situations. eGFR's persistently <60 mL/min signify possible Chronic Kidney Disease.    Anion gap 13 5 - 15  CBC     Status: Abnormal   Collection Time: 02/27/16 11:30 AM  Result Value Ref Range   WBC  9.2 4.0 - 10.5 K/uL   RBC 2.79 (L) 4.22 - 5.81 MIL/uL   Hemoglobin 7.8 (L) 13.0 - 17.0 g/dL   HCT  24.5 (L) 39.0 - 52.0 %   MCV 87.8 78.0 - 100.0 fL   MCH 28.0 26.0 - 34.0 pg   MCHC 31.8 30.0 - 36.0 g/dL   RDW 18.0 (H) 11.5 - 15.5 %   Platelets 116 (L) 150 - 400 K/uL    Comment: CONSISTENT WITH PREVIOUS RESULT  Glucose, capillary     Status: None   Collection Time: 02/27/16  4:11 PM  Result Value Ref Range   Glucose-Capillary 99 65 - 99 mg/dL  Glucose, capillary     Status: None   Collection Time: 02/27/16  7:57 PM  Result Value Ref Range   Glucose-Capillary 98 65 - 99 mg/dL  Glucose, capillary     Status: Abnormal   Collection Time: 02/27/16 11:38 PM  Result Value Ref Range   Glucose-Capillary 118 (H) 65 - 99 mg/dL  Glucose, capillary     Status: None   Collection Time: 02/28/16  3:30 AM  Result Value Ref Range   Glucose-Capillary 99 65 - 99 mg/dL  Glucose, capillary     Status: Abnormal   Collection Time: 02/28/16  8:08 AM  Result Value Ref Range   Glucose-Capillary 130 (H) 65 - 99 mg/dL  Glucose, capillary     Status: None   Collection Time: 02/28/16 11:51 AM  Result Value Ref Range   Glucose-Capillary 91 65 - 99 mg/dL  CBC     Status: Abnormal   Collection Time: 02/28/16 12:45 PM  Result Value Ref Range   WBC 11.0 (H) 4.0 - 10.5 K/uL   RBC 2.88 (L) 4.22 - 5.81 MIL/uL   Hemoglobin 7.9 (L) 13.0 - 17.0 g/dL   HCT 25.8 (L) 39.0 - 52.0 %   MCV 89.6 78.0 - 100.0 fL   MCH 27.4 26.0 - 34.0 pg   MCHC 30.6 30.0 - 36.0 g/dL   RDW 18.8 (H) 11.5 - 15.5 %   Platelets 138 (L) 150 - 400 K/uL  Basic metabolic panel     Status: Abnormal   Collection Time: 02/28/16 12:45 PM  Result Value Ref Range   Sodium 134 (L) 135 - 145 mmol/L   Potassium 4.7 3.5 - 5.1 mmol/L   Chloride 97 (L) 101 - 111 mmol/L   CO2 27 22 - 32 mmol/L   Glucose, Bld 98 65 - 99 mg/dL   BUN 51 (H) 6 - 20 mg/dL   Creatinine, Ser 3.27 (H) 0.61 - 1.24 mg/dL   Calcium 7.7 (L) 8.9 - 10.3 mg/dL   GFR calc non Af  Amer 22 (L) >60 mL/min   GFR calc Af Amer 25 (L) >60 mL/min    Comment: (NOTE) The eGFR has been calculated using the CKD EPI equation. This calculation has not been validated in all clinical situations. eGFR's persistently <60 mL/min signify possible Chronic Kidney Disease.    Anion gap 10 5 - 15  Magnesium     Status: None   Collection Time: 02/28/16 12:45 PM  Result Value Ref Range   Magnesium 1.9 1.7 - 2.4 mg/dL  Renal function panel     Status: Abnormal   Collection Time: 02/28/16 12:45 PM  Result Value Ref Range   Sodium 135 135 - 145 mmol/L   Potassium 4.7 3.5 - 5.1 mmol/L   Chloride 98 (L) 101 - 111 mmol/L   CO2 27 22 - 32 mmol/L   Glucose, Bld 99 65 - 99 mg/dL   BUN 50 (H) 6 - 20 mg/dL   Creatinine, Ser 3.23 (H) 0.61 -  1.24 mg/dL   Calcium 7.7 (L) 8.9 - 10.3 mg/dL   Phosphorus 3.9 2.5 - 4.6 mg/dL   Albumin 1.7 (L) 3.5 - 5.0 g/dL   GFR calc non Af Amer 22 (L) >60 mL/min   GFR calc Af Amer 26 (L) >60 mL/min    Comment: (NOTE) The eGFR has been calculated using the CKD EPI equation. This calculation has not been validated in all clinical situations. eGFR's persistently <60 mL/min signify possible Chronic Kidney Disease.    Anion gap 10 5 - 15  Glucose, capillary     Status: Abnormal   Collection Time: 02/28/16  4:05 PM  Result Value Ref Range   Glucose-Capillary 119 (H) 65 - 99 mg/dL  Sedimentation rate     Status: Abnormal   Collection Time: 02/28/16  7:44 PM  Result Value Ref Range   Sed Rate 90 (H) 0 - 16 mm/hr  C-reactive protein     Status: Abnormal   Collection Time: 02/28/16  7:44 PM  Result Value Ref Range   CRP 3.1 (H) <1.0 mg/dL  Glucose, capillary     Status: Abnormal   Collection Time: 02/28/16  8:07 PM  Result Value Ref Range   Glucose-Capillary 111 (H) 65 - 99 mg/dL  Glucose, capillary     Status: Abnormal   Collection Time: 02/29/16 12:59 AM  Result Value Ref Range   Glucose-Capillary 114 (H) 65 - 99 mg/dL  CBC with Differential/Platelet      Status: Abnormal   Collection Time: 02/29/16  3:07 AM  Result Value Ref Range   WBC 10.0 4.0 - 10.5 K/uL   RBC 2.61 (L) 4.22 - 5.81 MIL/uL   Hemoglobin 7.2 (L) 13.0 - 17.0 g/dL   HCT 23.6 (L) 39.0 - 52.0 %   MCV 90.4 78.0 - 100.0 fL   MCH 27.6 26.0 - 34.0 pg   MCHC 30.5 30.0 - 36.0 g/dL   RDW 19.1 (H) 11.5 - 15.5 %   Platelets 163 150 - 400 K/uL   Neutrophils Relative % 78 %   Neutro Abs 7.8 (H) 1.7 - 7.7 K/uL   Lymphocytes Relative 10 %   Lymphs Abs 1.0 0.7 - 4.0 K/uL   Monocytes Relative 11 %   Monocytes Absolute 1.1 (H) 0.1 - 1.0 K/uL   Eosinophils Relative 1 %   Eosinophils Absolute 0.1 0.0 - 0.7 K/uL   Basophils Relative 0 %   Basophils Absolute 0.0 0.0 - 0.1 K/uL  Magnesium     Status: None   Collection Time: 02/29/16  3:07 AM  Result Value Ref Range   Magnesium 2.0 1.7 - 2.4 mg/dL  Basic metabolic panel     Status: Abnormal   Collection Time: 02/29/16  3:07 AM  Result Value Ref Range   Sodium 134 (L) 135 - 145 mmol/L   Potassium 4.9 3.5 - 5.1 mmol/L   Chloride 93 (L) 101 - 111 mmol/L   CO2 30 22 - 32 mmol/L   Glucose, Bld 91 65 - 99 mg/dL   BUN 59 (H) 6 - 20 mg/dL   Creatinine, Ser 3.69 (H) 0.61 - 1.24 mg/dL   Calcium 7.9 (L) 8.9 - 10.3 mg/dL   GFR calc non Af Amer 19 (L) >60 mL/min   GFR calc Af Amer 22 (L) >60 mL/min    Comment: (NOTE) The eGFR has been calculated using the CKD EPI equation. This calculation has not been validated in all clinical situations. eGFR's persistently <60 mL/min signify possible Chronic Kidney  Disease.    Anion gap 11 5 - 15  Glucose, capillary     Status: None   Collection Time: 02/29/16  4:48 AM  Result Value Ref Range   Glucose-Capillary 74 65 - 99 mg/dL   Comment 1 Notify RN     Imaging: Dg Chest Port 1 View  Result Date: 02/29/2016 CLINICAL DATA:  CHF . EXAM: PORTABLE CHEST 1 VIEW COMPARISON:  02/29/2016 . FINDINGS: Tracheostomy tube in stable position. Interim removal of feeding tube. Cardiomegaly with bilateral  interstitial prominence and bilateral pleural effusions again noted consistent congestive heart failure. Similar findings noted on prior study bibasilar pneumonia cannot be excluded . Low lung volumes with basilar atelectasis. No pneumothorax. Cervical spine fusion . IMPRESSION: 1. Interim removal of feeding tube. Tracheostomy tube in stable position. 2. Cardiomegaly with bilateral from interstitial prominence and bilateral effusions consistent congestive heart failure. Bibasilar pneumonia cannot be excluded. 3. Low lung volumes with basilar atelectasis . Electronically Signed   By: Marcello Moores  Register   On: 02/29/2016 07:58  Dg Abd Portable 1v  Result Date: 02/28/2016 CLINICAL DATA:  Recent feeding catheter placement EXAM: PORTABLE ABDOMEN - 1 VIEW COMPARISON:  02/26/2016 FINDINGS: Scattered large and small bowel gas is noted. Feeding catheter is noted coiled within the stomach. Bibasilar infiltrative changes are noted. IMPRESSION: Feeding catheter within the stomach. Electronically Signed   By: Inez Catalina M.D.   On: 02/28/2016 22:33   Assessment:  1. Principal Problem: 2.   Acute respiratory failure ([place]) 3. Active Problems: 4.   Sepsis ([place]) 5.   ESRD (end stage renal disease) ([place]) 6.   Protein-calorie malnutrition, severe 7.   Acute on chronic systolic CHF (congestive heart failure), NYHA class 3 ([place]) 8.   NSTEMI (non-ST elevated myocardial infarction) ([place]) 9.   Bacteremia 10.   Tracheostomy status ([place]) 11.   End-stage renal disease on hemodialysis ([place]) 12.   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   Pericardial effusion 14.   Anemia in chronic kidney disease 15.   Acute blood loss anemia 16.   Severe malnutrition ([place]) 17.   Plan:  1. Neurologically improving - remembers seeing me when at [facility]. Now on trach collar, seen on dialysis. He shook his head "no" to chest pain. Had low grade flat elevation of troponin - may have been related to CHF/sepsis, but cannot exclude CAD.  D/w Dr. Sherral Hammers - will try to further optimize respiratory status over the weekend and plan for a lexiscan myoview next week.  Time Spent Directly with Patient:  15 minutes  Length of Stay:  LOS: 13 days   Pixie Casino, MD, [facility] Attending Cardiologist [place] 02/29/2016, 10:29 AM

## 2016-02-29 NOTE — Progress Notes (Signed)
PROGRESS NOTE    Tanner Campbell  MWU:[id number] DOB: July 15, 1976 DOA: 03/03/2016 PCP: No primary care provider on file.   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  He decompensated 6/23 and required intubation. The patient was found to have H. Influenza positive sputum and treated with Rocephin. CXR demonstrated a large right pleural effusion and he underwent a thoracentesis (1L serous fluid removed - exudative by protein). He had difficulty with weaning and was transferred to [facility] on 6/29 orally intubated for ventilator weaning. The patient developed fever and antibiotics were expanded to vancomycin, cefepime and diflucan. He had progressed to weaning on PSV x 4 hours as of 7/3. He self extubated on 7/5. Since that time he required BiPAP off and on. After HD 7/10 his mental status worsened and he became minimally responsive. CXR was consistent with RLL opacification and he was re-intubated. He was re-cultured and also found to have increased Troponin for which Cardiology was consulted. He was placed on LMWH, but had a GIB while on this w/ his hgb dropping as low as 8.4.  On 7/14 he remained ventilator dependent. His sputum cultures grew MSSA and E coli and he was noted to have + blood cultures. Because of these acutely worsening issues it was felt best to transfer him to the ICU at [facility].   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 Clinically patient improved will hold on starting any antibiotics. If patient develops fever/bands/left shift will consider restarting.  Bacteremia with positive Veillonella species (Gram negative rod) -continue Flagyl x 2 weeks for bacteremia as per ID recs   Bilateral pleural effusions s/p thoracentesis -See acute hypoxic respiratory failure  NSTEMI vs demand ischemia -Cardiology suggest he will need an eventual cardiac cath  -7/27 spoke with Dr. Lyman Bishop cardiology and we agreed would perform cardiac cath early next week. Continue to maximize respiratory status -Strict in and out since admission +2.6 L -Daily weight Filed Weights   02/28/16 0500 02/29/16 0450 02/29/16 0805  Weight: 57.5 kg (126 lb 12.2 oz) 60 kg (132 lb 4.4 oz) 57.7 kg (127 lb 3.3 oz)  -Manage fluids per HD  Small pericardial effusion with friction rub: Pericarditis? -Cardiology following  - ANA negative  - TEE without evidence of endocarditis/vegetations  - 7/27 EKG: Not consistent with pericarditis. - ESR, CRP  significantly elevated. Nonspecific considering patient's multiple other medical problems to include renal failure. Will hold on increasing aspirin at this time.  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, Th, S) -Nephrology following   Recent GIB on LMWH -No evidence of acute blood loss a this time,  however patient's H/H drop overnight obtain occult blood   Anemia of chronic kidney disease + acute blood loss  No evidence of blood loss, but Hgb drifted down to 7.1  - 7/24 transfuse 1U PRBC  -7/27 transfused 1 unit PRBC  DM type II w/ Hyperglycemia  -Patient actually hypoglycemic secondary to removal of CorTrak tube and continued  NPO -D10W at [facility]    Acute Encephalopathy in setting of critical illness Appears to be slowly improving - attempt to avoid sedatives and follow   Severe malnutrition in context of acute illness Continue supportive tube feeding  Disposition Planning  -PT recommended CIR  - CIR felt he is more appropriate for LTACH - sister does not want him to go back to [facility] because she didn't feel he was being treated well - plan is for SNF placement, but will have to be decannulated as he is ESRD and requires HD and there are no area SNFs that accept ESRD AND trach patients      DVT prophylaxis: Subcutaneous heparin Code Status: Full Family Communication: None Disposition Plan: Vent SNF   Consultants:  PCCM Dr. Lyman Bishop Cardiology  ID Nephrology  Dr.Su [facility] ENT  Procedures/Significant Events:  7/21 TEE;- No evidence of endocarditis.-Negative vegetation- Aortic insufficiency is seen, with a central jet. It   is likely due to degenerative changes.   7/24 transfuse 1U PRBC 7/27 PCXR: Cardiomegaly with bilateral from interstitial prominence and bilateral effusions C/W CHF, PNA cannot be excluded.- basilar atelectasis  7/27 transfused 1 unit PRBC   Cultures 7/14 MRSA by PCR negative 7/18 blood right hand x2 pending    Antimicrobials: Cipro 7/12>7/14 Vanc 7/2>7/17 Cefepime 7/14>7/21 Fluconazole 7/7>7/17 Flagyl 7/17 >   Devices 7/25 uncuffed #6 Shiley>>   LINES / TUBES:      Continuous Infusions: . sodium chloride 10 mL/hr at 02/25/16 0459  . dextrose           Objective: Vitals:   02/29/16 1406 02/29/16 1531 02/29/16  1736 02/29/16 1915  BP:   139/66   Pulse:  85 93 85  Resp:  _0 Temp:   97.3 F (36.3 C)   TempSrc:   Axillary   SpO2: 98% 99%  96%  Weight:      Height:        Intake/Output Summary (Last 24 hours) at 02/29/16 1917 Last data filed at 02/29/16 1256  Gross per 24 hour  Intake              335 ml  Output             4012 ml  Net            -3677 ml   Filed Weights   02/28/16 0500 02/29/16 0450 02/29/16 0805  Weight: 57.5 kg (126 lb 12.2 oz) 60 kg (132 lb 4.4 oz) 57.7 kg (127 lb 3.3 oz)    Examination:  General: Alert, follows commands(Tired post HD),, positive acute respiratory distress (improving) Eyes: negative scleral hemorrhage, negative anisocoria, negative icterus ENT: Negative Runny nose, negative gingival bleeding, Neck:  Negative scars, masses, torticollis, lymphadenopathy, JVD Lungs: diffuse rhonchi (improved from 7/26), negative wheezes or crackles  Cardiovascular:  Regular rhythm and rate without murmur gallop or rub normal S1 and S2 Abdomen: negative abdominal pain, nondistended, positive soft, bowel sounds, no rebound, no ascites, no appreciable mass Extremities: No significant cyanosis, clubbing, or edema bilateral lower extremities Skin: Negative rashes, lesions, ulcers Psychiatric:  Unable to evaluate  Central nervous system:  Cranial nerves II through XII intact, follows commands moves extremities   .     Data Reviewed: Care during the described time interval was provided by me .  I have reviewed this patient's available data, including medical history, events of note, physical examination, and all test results as part of my evaluation. I have personally reviewed and interpreted all radiology studies.  CBC:  Recent Labs Lab 02/26/16 1711 02/27/16 0918 02/27/16 1130 02/28/16 1245 02/29/16 0307  WBC 10.8* 9.4 9.2 11.0* 10.0  NEUTROABS  --   --   --   --  7.8*  HGB 7.1* 8.2* 7.8* 7.9* 7.2*  HCT 23.1* 26.3* 24.5* 25.8* 23.6*  MCV 88.5 88.3  87.8 89.6 90.4  PLT 112* 115* 116* 138* 546   Basic Metabolic Panel:  Recent Labs Lab 02/24/16 0514 02/25/16 0300 02/26/16 1406 02/27/16 1130 02/28/16 1245 02/29/16 0307  NA 138 139 136 138 135  134* 134*  K 4.0 4.2 4.3 4.8 4.7  4.7 4.9  CL 102 104 102 100* 98*  97* 93*  CO2 _0 GLUCOSE 86 189* 137* 119* 99  98 91  BUN 26* 43* 70* 69* 50*  51* 59*  CREATININE 2.42* 3.22* 4.37* 4.17* 3.23*  3.27* 3.69*  CALCIUM 7.7* 7.7* 7.5* 7.7* 7.7*  7.7* 7.9*  MG  --   --   --   --  1.9 2.0  PHOS 3.2 3.3 3.5 3.8 3.9  --    GFR: Estimated Creatinine Clearance: 21.5 mL/min (by C-G formula based on SCr of 3.69 mg/dL). Liver Function Tests:  Recent Labs Lab 02/24/16 0514 02/25/16 0300 02/26/16 1406 02/27/16 1130 02/28/16 1245  ALBUMIN 1.7* 1.8* 1.6* 1.6* 1.7*   No results for input(s): LIPASE, AMYLASE in the last 168 hours. No results for input(s): AMMONIA in the last 168 hours. Coagulation Profile: No results for input(s): INR, PROTIME in the last 168 hours. Cardiac Enzymes:  Recent Labs Lab 02/05/2016 1832 02/24/16 0005 02/24/16 0514 02/25/16 0300  TROPONINI 0.27* 0.32* 0.33* 0.29*   BNP (last 3 results) No results for input(s): PROBNP in the last 8760 hours. HbA1C: No results for input(s): HGBA1C in the last 72 hours. CBG:  Recent Labs Lab 02/29/16 0059 02/29/16 0448 02/29/16 1350 02/29/16 1732 02/29/16 1752  GLUCAP 114* 74 55* 44* 98   Lipid Profile: No results for input(s): CHOL, HDL, LDLCALC, TRIG, CHOLHDL, LDLDIRECT in the last 72 hours. Thyroid Function Tests: No results for input(s): TSH, T4TOTAL, FREET4, T3FREE, THYROIDAB in the last 72 hours. Anemia Panel: No results for input(s): VITAMINB12, FOLATE, FERRITIN, TIBC, IRON, RETICCTPCT in the last 72 hours. Urine analysis:    Component Value Date/Time   COLORURINE RED (A) 02/04/2016 1426   APPEARANCEUR CLOUDY (A) 02/04/2016 1426   LABSPEC 1.025 02/04/2016 1426   PHURINE 7.5  02/04/2016 1426   GLUCOSEU 500 (A) 02/04/2016 1426   HGBUR SMALL (A) 02/04/2016 1426   BILIRUBINUR SMALL (A) 02/04/2016 1426   KETONESUR NEGATIVE 02/04/2016 1426   PROTEINUR >300 (A) 02/04/2016 1426   NITRITE NEGATIVE 02/04/2016 1426   LEUKOCYTESUR TRACE (A) 02/04/2016 1426   Sepsis Labs: _1 (procalcitonin:4,lacticidven:4)  )  Recent Results (from the past 240 hour(s))  Culture, blood (Routine X 2) w Reflex to ID Panel     Status: None   Collection Time: 03/04/2016  9:52 AM  Result Value Ref Range Status   Specimen Description BLOOD RIGHT HAND  Final   Special Requests IN PEDIATRIC BOTTLE  3CC  Final   Culture NO GROWTH 5 DAYS  Final   Report Status 02/25/2016 FINAL  Final  Culture, blood (Routine X 2) w Reflex to ID Panel     Status: None   Collection Time: 02/03/2016  9:56 AM  Result Value Ref Range Status   Specimen Description BLOOD RIGHT HAND  Final   Special Requests IN PEDIATRIC BOTTLE  [facility]  Final   Culture NO GROWTH 5 DAYS  Final   Report Status 02/25/2016 FINAL  Final         Radiology Studies: Dg Chest Port 1 View  Result Date: 02/29/2016 CLINICAL DATA:  CHF . EXAM: PORTABLE CHEST 1 VIEW COMPARISON:  02/09/2016 . FINDINGS: Tracheostomy tube in stable position. Interim removal of feeding tube. Cardiomegaly with bilateral interstitial prominence and bilateral pleural effusions again noted consistent congestive heart failure. Similar findings noted on prior study bibasilar pneumonia cannot be excluded . Low lung volumes with basilar atelectasis. No pneumothorax. Cervical spine fusion . IMPRESSION: 1. Interim removal of feeding tube. Tracheostomy tube in stable position. 2. Cardiomegaly with bilateral from interstitial prominence and bilateral effusions consistent congestive heart failure. Bibasilar pneumonia cannot be excluded. 3. Low lung volumes with basilar atelectasis . Electronically Signed   By: Marcello Moores  Register   On: 02/29/2016 07:58  Dg Abd Portable  1v  Result Date: 02/28/2016 CLINICAL DATA:  Recent feeding catheter placement EXAM: PORTABLE ABDOMEN - 1 VIEW COMPARISON:  02/26/2016 FINDINGS: Scattered large and small bowel gas is noted. Feeding catheter is noted coiled within the stomach. Bibasilar infiltrative changes are noted. IMPRESSION: Feeding catheter within the stomach. Electronically Signed   By: Inez Catalina M.D.   On: 02/28/2016 22:33       Scheduled Meds: . sodium chloride   Intravenous Once  . acetylcysteine  4 mL Nebulization QID  . antiseptic oral rinse  7 mL Mouth Rinse QID  . aspirin  81 mg Per Tube Daily  . atorvastatin  80 mg Per Tube q1800  . chlorhexidine gluconate (SAGE KIT)  15 mL Mouth Rinse BID  . darbepoetin (ARANESP) injection - DIALYSIS  200 mcg Intravenous Q Thu-HD  . feeding supplement (VITAL AF 1.2 CAL)  1,000 mL Per Tube Q24H  . ferric gluconate (FERRLECIT/NULECIT) IV  125 mg Intravenous Q T,Th,Sa-HD  . heparin subcutaneous  5,000 Units Subcutaneous Q8H  . insulin aspart  0-9 Units Subcutaneous Q4H  . insulin glargine  10 Units Subcutaneous BID  . ipratropium-albuterol  3 mL Nebulization QID  . isosorbide dinitrate  10 mg Per Tube TID  . metoprolol tartrate  25 mg Per Tube BID  . metroNIDAZOLE  500 mg Per Tube TID  . multivitamin  1 tablet Oral QHS  . pantoprazole sodium  40 mg Per Tube Daily   Continuous Infusions: . sodium chloride 10 mL/hr at 02/25/16 0459  . dextrose       LOS: 13 days    Time spent: 40 minutes    WOODS, Geraldo Docker, MD [facility] Pager [phone]   If 7PM-7AM, please contact night-coverage [email or web address] Password [email or web address] 02/29/2016, 7:17 PM

## 2016-03-01 DIAGNOSIS — I351 Nonrheumatic aortic (valve) insufficiency: Secondary | ICD-10-CM

## 2016-03-01 LAB — TYPE AND SCREEN
ABO/RH(D): AB POS
Antibody Screen: NEGATIVE
UNIT DIVISION: 0
Unit division: 0

## 2016-03-01 LAB — CBC WITH DIFFERENTIAL/PLATELET
BASOS PCT: 0 %
Basophils Absolute: 0 10*3/uL (ref 0.0–0.1)
Eosinophils Absolute: 0 10*3/uL (ref 0.0–0.7)
Eosinophils Relative: 0 %
HEMATOCRIT: 33.2 % — AB (ref 39.0–52.0)
HEMOGLOBIN: 10.3 g/dL — AB (ref 13.0–17.0)
LYMPHS ABS: 0.6 10*3/uL — AB (ref 0.7–4.0)
LYMPHS PCT: 6 %
MCH: 27.7 pg (ref 26.0–34.0)
MCHC: 31 g/dL (ref 30.0–36.0)
MCV: 89.2 fL (ref 78.0–100.0)
MONOS PCT: 11 %
Monocytes Absolute: 1.3 10*3/uL — ABNORMAL HIGH (ref 0.1–1.0)
NEUTROS ABS: 9.1 10*3/uL — AB (ref 1.7–7.7)
NEUTROS PCT: 83 %
Platelets: 160 10*3/uL (ref 150–400)
RBC: 3.72 MIL/uL — ABNORMAL LOW (ref 4.22–5.81)
RDW: 19.7 % — ABNORMAL HIGH (ref 11.5–15.5)
WBC: 11 10*3/uL — ABNORMAL HIGH (ref 4.0–10.5)

## 2016-03-01 LAB — BASIC METABOLIC PANEL
Anion gap: 10 (ref 5–15)
BUN: 16 mg/dL (ref 6–20)
CHLORIDE: 96 mmol/L — AB (ref 101–111)
CO2: 30 mmol/L (ref 22–32)
CREATININE: 1.83 mg/dL — AB (ref 0.61–1.24)
Calcium: 8.2 mg/dL — ABNORMAL LOW (ref 8.9–10.3)
GFR calc Af Amer: 51 mL/min — ABNORMAL LOW (ref >60)
GFR calc non Af Amer: 44 mL/min — ABNORMAL LOW (ref >60)
GLUCOSE: 110 mg/dL — AB (ref 65–99)
POTASSIUM: 3.5 mmol/L (ref 3.5–5.1)
SODIUM: 136 mmol/L (ref 135–145)

## 2016-03-01 LAB — GLUCOSE, CAPILLARY
GLUCOSE-CAPILLARY: 125 mg/dL — AB (ref 65–99)
GLUCOSE-CAPILLARY: 131 mg/dL — AB (ref 65–99)
Glucose-Capillary: 113 mg/dL — ABNORMAL HIGH (ref 65–99)
Glucose-Capillary: 110 mg/dL — ABNORMAL HIGH (ref 65–99)
Glucose-Capillary: 112 mg/dL — ABNORMAL HIGH (ref 65–99)
Glucose-Capillary: 112 mg/dL — ABNORMAL HIGH (ref 65–99)

## 2016-03-01 LAB — MAGNESIUM: Magnesium: 1.9 mg/dL (ref 1.7–2.4)

## 2016-03-01 MED ORDER — METHOCARBAMOL 1000 MG/10ML IJ SOLN
500.0000 mg | Freq: Once | INTRAVENOUS | Status: AC
  Administered 2016-03-01: 500 mg via INTRAVENOUS
  Filled 2016-03-01: qty 5

## 2016-03-01 MED ORDER — PRO-STAT SUGAR FREE PO LIQD
30.0000 mL | Freq: Every day | ORAL | Status: DC
  Administered 2016-03-03 – 2016-03-04 (×2): 30 mL
  Filled 2016-03-01 (×2): qty 30

## 2016-03-01 MED ORDER — FUROSEMIDE 10 MG/ML IJ SOLN
INTRAMUSCULAR | Status: AC
  Filled 2016-03-01: qty 8

## 2016-03-01 MED ORDER — NEPRO/CARBSTEADY PO LIQD
1000.0000 mL | ORAL | Status: DC
  Administered 2016-03-02 – 2016-03-04 (×4): 1000 mL
  Filled 2016-03-01 (×7): qty 1000

## 2016-03-01 MED ORDER — MORPHINE SULFATE (PF) 2 MG/ML IV SOLN
2.0000 mg | Freq: Once | INTRAVENOUS | Status: AC
  Administered 2016-03-01: 2 mg via INTRAVENOUS

## 2016-03-01 MED ORDER — FUROSEMIDE 10 MG/ML IJ SOLN
80.0000 mg | Freq: Two times a day (BID) | INTRAMUSCULAR | Status: DC
  Administered 2016-03-01 (×2): 80 mg via INTRAVENOUS
  Filled 2016-03-01 (×2): qty 8

## 2016-03-01 NOTE — Progress Notes (Signed)
Physical Therapy Treatment Patient Details Name: Tanner Campbell MRN: [id number] DOB: [birth year] Today's Date: 03/01/2016    History of Present Illness Tanner Campbell is an 40 y.o. 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 He was intubated 6/23-7/5 (self extubated), 7/10-7/16 (self extubated), was then reintubated. Initially, sputum was positive for H influenza and subsequently for MSSA and Escherichia coli and blood cultures growing a gram-negative anaerobic organism. Thoracentesis on right showed borderline exudate by LDH criteria. Tanner Campbell 7/18; NG tube feedings    PT Comments    Mr. Bartels made good progress today and transferred to the chair with +2 max assist.  Pt fatigues quickly but is progressing well and is motivated to get stronger. Recommending CIR as pt reports he was Ind working full-time as a Engineer, structural.  Believe that with intense rehab services pt may be able to reach mod I level of functioning.   Follow Up Recommendations  Supervision/Assistance - 24 hour;CIR     Equipment Recommendations  Other (comment) (TBA)    Recommendations for Other Services Rehab consult     Precautions / Restrictions Precautions Precautions: Fall Precaution Comments: Trach Restrictions Weight Bearing Restrictions: No    Mobility  Bed Mobility Overal bed mobility: Needs Assistance Bed Mobility: Supine to Sit     Supine to sit: Mod assist;+2 for physical assistance;HOB elevated     General bed mobility comments: Assist moving LEs to EOB and use of bed pad for positioning.    Transfers Overall transfer level: Needs assistance Equipment used: None Transfers: Sit to/from [employer] Sit to Stand: +2 physical assistance;Max assist;+2 safety/equipment Stand pivot transfers: Max assist;+2 physical assistance;+2  safety/equipment       General transfer comment: Assist to boost to standing and to maintain upright as pt demonstrates flexed posture.  Upon first sit>stand pt c/o dizziness and returns to sitting (systolic BP 100 down from 150s).  Pt sat EOB for ~5 minutes before systolic BP up to 150s and pt performed stand pivot transfer to chair with max +2 assist.  Pt assisting by shuffling feet.  Ambulation/Gait             General Gait Details: unable to attempt at this time   Stairs            Wheelchair Mobility    Modified Rankin (Stroke Patients Only)       Balance Overall balance assessment: Needs assistance Sitting-balance support: Bilateral upper extremity supported;Feet supported Sitting balance-Leahy Scale: Poor Sitting balance - Comments: Pt able to sit EOB for ~10 minutes with Bil UE supported on bed with close min guard for assist.                            Cognition Arousal/Alertness: Awake/alert Behavior During Therapy: Flat affect Overall Cognitive Status: No family/caregiver present to determine baseline cognitive functioning Area of Impairment: Attention;Following commands;Orientation;Problem solving;Memory;Awareness Orientation Level: Place;Time;Situation (thinks he is in [place] and that it is August) Current Attention Level: [place] Memory: Decreased short-term memory Following Commands: Follows one step commands with increased time   Awareness: Intellectual Problem Solving: Slow processing;Decreased initiation;Difficulty sequencing;Requires verbal cues;Requires tactile cues General Comments: responses very delayed (>10-15 seconds at times)    Exercises      General Comments General comments (skin  integrity, edema, etc.): Used PMSV and pt able to tell this PT that he is from [place] and lives with his sister and brother in [place].  Unclear why pt was in [place], his response is "for the hot women".  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      Pertinent Vitals/Pain Pain Assessment: Faces Faces Pain Scale: Hurts whole lot Pain Location: buttocks with bed mobility due to pressure ulcer Pain Descriptors / Indicators: Grimacing;Moaning Pain Intervention(s): Limited activity within patient's tolerance;Monitored during session;Repositioned    Home Living                      Prior Function            PT Goals (current goals can now be found in the care plan section) Acute Rehab PT Goals Patient Stated Goal: to get stronger PT Goal Formulation: With patient Time For Goal Achievement: 03/05/16 Potential to Achieve Goals: Good Progress towards PT goals: Progressing toward goals    Frequency  Min 3X/week    PT Plan Current plan remains appropriate    Co-evaluation             End of Session Equipment Utilized During Treatment: Oxygen;Gait belt (trach) Activity Tolerance: Patient tolerated treatment well;Patient limited by fatigue;Patient limited by pain Patient left: with call bell/phone within reach;in chair;with chair alarm set;with SCD's reapplied;Other (comment) (sitting on 2 pillows)     Time: 9604-5409 PT Time Calculation (min) (ACUTE ONLY): 30 min  Charges:  $Therapeutic Activity: 23-37 mins                    G Codes:       Encarnacion Chu PT, DPT  Pager: [phone] Phone: [phone] 03/01/2016, 11:31 AM
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
## 2016-03-01 NOTE — Progress Notes (Signed)
Rehab Admissions Coordinator Note:  Patient was screened by Clois Dupes for appropriateness for an Inpatient Acute Rehab Consult per PT recommendations. Marissa Nestle, PA and Dr. Wynn Banker noted 02/22/16 per their assessment that pt most appropriate LTACH. Pt is trach and HD dependent which makes dispo complicated for he can not receive OP dialysis with a trach in this local area.  At this time, we are recommending LTACH.Please call me with any questions.  Clois Dupes 03/01/2016, 11:43 AM  I can be reached at [phone].

## 2016-03-01 NOTE — Progress Notes (Signed)
Pt having hypoglycemic episode with BS of 65. Pt is currently lethargic and harder to arouse. Paged [facility] for orders. Pt given half an amp of D50. 15 min post med administration BS is 120.

## 2016-03-01 NOTE — Progress Notes (Signed)
Nutrition Follow-up  DOCUMENTATION CODES:   Severe malnutrition in context of acute illness/injury  INTERVENTION:    D/C Vital AF 1.2 formula    Once CORTRAK tube replaced, initiate Nepro formula at 20 ml/hr and increase by 10 ml every 4 hours to goal rate of 40 ml/hr   Prostat liquid protein 30 ml via tube daily  Total TF regimen to provide 1828 kcals, 93 gm protein, 698 ml of free water  NUTRITION DIAGNOSIS:   Increased nutrient needs related to acute illness as evidenced by estimated needs, ongoing  GOAL:   Patient will meet greater than or equal to 90% of their needs, met  MONITOR:   TF tolerance, Labs, Weight trends, Diet advancement, Vent status, I & O's, Skin  ASSESSMENT:   40 year old esrd admitted initially to [facility] d/t PNA and failure to wean. Self extubated 7/5. Clinically deteriorated over the following days. Required re-intubation 7/10 w/ findings of new HCAP and bacteremia. Transferred to [facility] on 7/14 with concerns for sepsis, NSTEMI, GIB, and endocarditis.   Pt transferred from 58M-MICU to 2C-Stepdown 7/22. He is currently on trach collar. Vital AF 1.2 formula off >> pt pulled out CORTRAK feeding tube >> order to be replaced. Speech Path following for Dysphagia and PMSV.  Diet Order:  Diet NPO time specified  Skin:  Wound (see comment) (stage II pressure ulcer on sacrum)  Last BM:  7/25  Height:   Ht Readings from Last 1 Encounters:  02/18/2016 5' 5"  (1.651 m)    Weight:   Wt Readings from Last 1 Encounters:  03/01/16 121 lb 14.6 oz (55.3 kg)    Ideal Body Weight:  59.1 kg  BMI:  Body mass index is 20.29 kg/m.  Estimated Nutritional Needs:   Kcal:  1700-1900  Protein:  85-100 gm  Fluid:  1.8 L  EDUCATION NEEDS:   No education needs identified at this time  Arthur Holms, RD, LDN Pager #: [phone] After-Hours Pager #: [phone]

## 2016-03-01 NOTE — Progress Notes (Signed)
Spoke with patient about his status prior to admission. Patient told this RN that he had only lived with his sister for a little while and that he was able to drive himself back and forth to his HD treatments. At the request of CM, MD has been asked to consult for psychiatric evaluation to determine patients level of competency to make his own medical decisions.

## 2016-03-01 NOTE — Progress Notes (Addendum)
PROGRESS NOTE    Tanner Campbell  PJA:[id number] DOB: [birth year] DOA: 02/03/2016 PCP: No primary care provider on file.   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  He decompensated 6/23 and required intubation. The patient was found to have H. Influenza positive sputum and treated with Rocephin. CXR demonstrated a large right pleural effusion and he underwent a thoracentesis (1L serous fluid removed - exudative by protein). He had difficulty with weaning and was transferred to [facility] on 6/29 orally intubated for ventilator weaning. The patient developed fever and antibiotics were expanded to vancomycin, cefepime and diflucan. He had progressed to weaning on PSV x 4 hours as of 7/3. He self extubated on 7/5. Since that time he required BiPAP off and on. After HD 7/10 his mental status worsened and he became minimally responsive. CXR was consistent with RLL opacification and he was re-intubated. He was re-cultured and also found to have increased Troponin for which Cardiology was consulted. He was placed on LMWH, but had a GIB while on this w/ his hgb dropping as low as 8.4.  On 7/14 he remained ventilator dependent. His sputum cultures grew MSSA and E coli and he was noted to have + blood cultures. Because of these acutely worsening issues it was felt best to transfer him to the ICU at [facility].   Subjective: 7/28 A/O 4, able to speak with Passy-Muir valve in place. 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 Clinically patient improved will hold on starting any antibiotics. If patient develops fever/bands/left shift will consider restarting. -Physiotherapy vest BID  Bacteremia with positive Veillonella species (Gram negative rod) -continue Flagyl x 2 weeks for bacteremia as per ID recs   Bilateral pleural effusions s/p thoracentesis -See acute hypoxic respiratory failure -Lasix IV 80 mgBID  NSTEMI vs demand ischemia -Cardiology suggest he will need an eventual cardiac cath  -7/27 spoke with Dr. Lyman Bishop cardiology and we agreed would perform cardiac cath early next week. Continue to maximize respiratory status -Strict in and out since admission +2.8 L -Daily weight Filed Weights   02/29/16 0450 02/29/16 0805 03/01/16 0345  Weight: 60 kg (132 lb 4.4 oz) 57.7 kg (127 lb 3.3 oz) 55.3 kg (121 lb 14.6 oz)  -Manage fluids per HD;See pleural effusion   Small pericardial effusion with friction rub: Pericarditis? -Cardiology following  -  ANA negative  - TEE without evidence of endocarditis/vegetations  - 7/27 EKG: Not consistent with pericarditis. - ESR, CRP significantly elevated. Nonspecific considering patient's multiple other medical problems to include renal failure. Will hold on increasing aspirin at this time.  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,  Th, S) -Nephrology following   Recent GIB on LMWH -No evidence of acute blood loss a this time, however patient's H/H drop overnight obtain occult blood   Anemia of chronic kidney disease + acute blood loss  No evidence of blood loss, but Hgb drifted down to 7.1  - 7/24 transfuse 1U PRBC  -7/27 transfused 1 unit PRBC  DM type II uncontrolled with complications  -4/82 Hemoglobin A1c= 7.7 -Patient actually hypoglycemic secondary to removal of CorTrak tube and continued  NPO -D10W at [facility]    Acute Encephalopathy in setting of critical illness Appears to be slowly improving - attempt to avoid sedatives and follow   Severe malnutrition in context of acute illness -7/28 have requested CorTrak tube be reinserted. Most likely will be accomplished on 7/29     Goals of care -PT recommended CIR  - CIR felt he is more appropriate for [facility] - sister does not want him to go back to [facility] because she didn't feel he was being treated well - plan is for SNF placement, but will have to be decannulated as he is ESRD and requires HD and there are no area SNFs that accept ESRD AND trach patients   -Consult psychiatry on 7/29 to ensure patient has competency. Patient's family unrealistic in their expectations therefore would like patient to make decisions if deemed competent     DVT prophylaxis: Subcutaneous heparin Code Status: Full Family Communication: None Disposition Plan: Vent SNF   Consultants:  PCCM Dr. Lyman Bishop Cardiology  ID Nephrology  Dr.Su [facility] ENT   Procedures/Significant Events:  7/21 TEE;- No evidence of endocarditis.-Negative vegetation- Aortic insufficiency is seen, with a central jet. It   is likely due to degenerative changes.   7/24 transfuse 1U PRBC 7/27 PCXR: Cardiomegaly with bilateral from interstitial prominence and bilateral effusions C/W CHF, PNA cannot be excluded.- basilar atelectasis  7/27 transfused 1 unit PRBC   Cultures 7/14 MRSA by PCR  negative 7/18 blood right hand x2 pending    Antimicrobials: Cipro 7/12>7/14 Vanc 7/2>7/17 Cefepime 7/14>7/21 Fluconazole 7/7>7/17 Flagyl 7/17 >   Devices 7/25 uncuffed #6 Shiley>>   LINES / TUBES:      Continuous Infusions: . sodium chloride 10 mL/hr at 02/25/16 0459  . dextrose 50 mL/hr at 02/29/16 2054         Objective: Vitals:   03/01/16 0700 03/01/16 0737 03/01/16 0741 03/01/16 0802  BP: (!) 158/44 (!) 158/44  (!) 160/44  Pulse: 86 95  94  Resp: (!) 5 11  17   Temp:  98.4 F (36.9 C)    TempSrc:  Oral    SpO2: 99% 96% 97% 96%  Weight:      Height:        Intake/Output Summary (Last 24 hours) at 03/01/16 1100 Last data filed at 03/01/16 0100  Gross per 24 hour  Intake           555.33 ml  Output             4012 ml  Net         -3456.67 ml   Filed Weights   02/29/16 0450 02/29/16 0805 03/01/16 0345  Weight: 60  kg (132 lb 4.4 oz) 57.7 kg (127 lb 3.3 oz) 55.3 kg (121 lb 14.6 oz)    Examination:  General: A/O 4 , follows commands, positive acute respiratory distress (improving) Eyes: negative scleral hemorrhage, negative anisocoria, negative icterus ENT: Negative Runny nose, negative gingival bleeding, Neck:  Negative scars, masses, torticollis, lymphadenopathy, JVD Lungs: diffuse rhonchi (improved from 7/27) Lt >>Rt, negative wheezes or crackles Cardiovascular:  Regular rhythm and rate without murmur gallop or rub normal S1 and S2 Abdomen: negative abdominal pain, nondistended, positive soft, bowel sounds, no rebound, no ascites, no appreciable mass Extremities: No significant cyanosis, clubbing, or edema bilateral lower extremities Skin: Negative rashes, lesions, ulcers Psychiatric:  Unable to evaluate  Central nervous system:  Cranial nerves II through XII intact, follows commands moves extremities   .     Data Reviewed: Care during the described time interval was provided by me .  I have reviewed this patient's available data, including  medical history, events of note, physical examination, and all test results as part of my evaluation. I have personally reviewed and interpreted all radiology studies.  CBC:  Recent Labs Lab 02/27/16 0918 02/27/16 1130 02/28/16 1245 02/29/16 0307 03/01/16 0223  WBC 9.4 9.2 11.0* 10.0 11.0*  NEUTROABS  --   --   --  7.8* 9.1*  HGB 8.2* 7.8* 7.9* 7.2* 10.3*  HCT 26.3* 24.5* 25.8* 23.6* 33.2*  MCV 88.3 87.8 89.6 90.4 89.2  PLT 115* 116* 138* 163 841   Basic Metabolic Panel:  Recent Labs Lab 02/24/16 0514 02/25/16 0300 02/26/16 1406 02/27/16 1130 02/28/16 1245 02/29/16 0307 03/01/16 0223  NA 138 139 136 138 135  134* 134* 136  K 4.0 4.2 4.3 4.8 4.7  4.7 4.9 3.5  CL 102 104 102 100* 98*  97* 93* 96*  CO2 25 26 23 25 27  27 30 30   GLUCOSE 86 189* 137* 119* 99  98 91 110*  BUN 26* 43* 70* 69* 50*  51* 59* 16  CREATININE 2.42* 3.22* 4.37* 4.17* 3.23*  3.27* 3.69* 1.83*  CALCIUM 7.7* 7.7* 7.5* 7.7* 7.7*  7.7* 7.9* 8.2*  MG  --   --   --   --  1.9 2.0 1.9  PHOS 3.2 3.3 3.5 3.8 3.9  --   --    GFR: Estimated Creatinine Clearance: 41.6 mL/min (by C-G formula based on SCr of 1.83 mg/dL). Liver Function Tests:  Recent Labs Lab 02/24/16 0514 02/25/16 0300 02/26/16 1406 02/27/16 1130 02/28/16 1245  ALBUMIN 1.7* 1.8* 1.6* 1.6* 1.7*   No results for input(s): LIPASE, AMYLASE in the last 168 hours. No results for input(s): AMMONIA in the last 168 hours. Coagulation Profile: No results for input(s): INR, PROTIME in the last 168 hours. Cardiac Enzymes:  Recent Labs Lab 02/21/2016 1832 02/24/16 0005 02/24/16 0514 02/25/16 0300  TROPONINI 0.27* 0.32* 0.33* 0.29*   BNP (last 3 results) No results for input(s): PROBNP in the last 8760 hours. HbA1C: No results for input(s): HGBA1C in the last 72 hours. CBG:  Recent Labs Lab 02/29/16 2112 02/29/16 2337 03/01/16 0134 03/01/16 0342 03/01/16 0737  GLUCAP 120* 107* 113* 110* 112*   Lipid Profile: No results  for input(s): CHOL, HDL, LDLCALC, TRIG, CHOLHDL, LDLDIRECT in the last 72 hours. Thyroid Function Tests: No results for input(s): TSH, T4TOTAL, FREET4, T3FREE, THYROIDAB in the last 72 hours. Anemia Panel: No results for input(s): VITAMINB12, FOLATE, FERRITIN, TIBC, IRON, RETICCTPCT in the last 72 hours. Urine analysis:    Component  Value Date/Time   COLORURINE RED (A) 02/04/2016 1426   APPEARANCEUR CLOUDY (A) 02/04/2016 1426   LABSPEC 1.025 02/04/2016 1426   PHURINE 7.5 02/04/2016 1426   GLUCOSEU 500 (A) 02/04/2016 1426   HGBUR SMALL (A) 02/04/2016 1426   BILIRUBINUR SMALL (A) 02/04/2016 1426   KETONESUR NEGATIVE 02/04/2016 1426   PROTEINUR >300 (A) 02/04/2016 1426   NITRITE NEGATIVE 02/04/2016 1426   LEUKOCYTESUR TRACE (A) 02/04/2016 1426   Sepsis Labs: @LABRCNTIP (procalcitonin:4,lacticidven:4)  ) No results found for this or any previous visit (from the past 240 hour(s)).       Radiology Studies: Dg Chest Port 1 View  Result Date: 02/29/2016 CLINICAL DATA:  CHF . EXAM: PORTABLE CHEST 1 VIEW COMPARISON:  02/03/2016 . FINDINGS: Tracheostomy tube in stable position. Interim removal of feeding tube. Cardiomegaly with bilateral interstitial prominence and bilateral pleural effusions again noted consistent congestive heart failure. Similar findings noted on prior study bibasilar pneumonia cannot be excluded . Low lung volumes with basilar atelectasis. No pneumothorax. Cervical spine fusion . IMPRESSION: 1. Interim removal of feeding tube. Tracheostomy tube in stable position. 2. Cardiomegaly with bilateral from interstitial prominence and bilateral effusions consistent congestive heart failure. Bibasilar pneumonia cannot be excluded. 3. Low lung volumes with basilar atelectasis . Electronically Signed   By: Marcello Moores  Register   On: 02/29/2016 07:58  Dg Abd Portable 1v  Result Date: 02/28/2016 CLINICAL DATA:  Recent feeding catheter placement EXAM: PORTABLE ABDOMEN - 1 VIEW COMPARISON:   02/26/2016 FINDINGS: Scattered large and small bowel gas is noted. Feeding catheter is noted coiled within the stomach. Bibasilar infiltrative changes are noted. IMPRESSION: Feeding catheter within the stomach. Electronically Signed   By: Inez Catalina M.D.   On: 02/28/2016 22:33       Scheduled Meds: . sodium chloride   Intravenous Once  . acetylcysteine  4 mL Nebulization QID  . antiseptic oral rinse  7 mL Mouth Rinse QID  . aspirin  81 mg Per Tube Daily  . atorvastatin  80 mg Per Tube q1800  . chlorhexidine gluconate (SAGE KIT)  15 mL Mouth Rinse BID  . darbepoetin (ARANESP) injection - DIALYSIS  200 mcg Intravenous Q Thu-HD  . feeding supplement (VITAL AF 1.2 CAL)  1,000 mL Per Tube Q24H  . ferric gluconate (FERRLECIT/NULECIT) IV  125 mg Intravenous Q T,Th,Sa-HD  . heparin subcutaneous  5,000 Units Subcutaneous Q8H  . insulin aspart  0-9 Units Subcutaneous Q4H  . ipratropium-albuterol  3 mL Nebulization QID  . isosorbide dinitrate  10 mg Per Tube TID  . metoprolol tartrate  25 mg Per Tube BID  . metroNIDAZOLE  500 mg Per Tube TID  . multivitamin  1 tablet Oral QHS  . pantoprazole sodium  40 mg Per Tube Daily   Continuous Infusions: . sodium chloride 10 mL/hr at 02/25/16 0459  . dextrose 50 mL/hr at 02/29/16 2054     LOS: 14 days    Time spent: 40 minutes    Elisandra Deshmukh, Geraldo Docker, MD [facility] Pager [phone]   If 7PM-7AM, please contact night-coverage [email or web address] Password [email or web address] 03/01/2016, 11:00 AM

## 2016-03-01 NOTE — Progress Notes (Signed)
Subjective: Interval History: has no complaint .  Objective: Vital signs in last 24 hours: Temp:  [97.3 F (36.3 C)-98.4 F (36.9 C)] 98.4 F (36.9 C) (07/28 0737) Pulse Rate:  [77-100] 95 (07/28 0737) Resp:  [5-28] 11 (07/28 0737) BP: (97-174)/(33-81) 158/44 (07/28 0737) SpO2:  [93 %-100 %] 96 % (07/28 0737) FiO2 (%):  [28 %] 28 % (07/28 0359) Weight:  [55.3 kg (121 lb 14.6 oz)-57.7 kg (127 lb 3.3 oz)] 55.3 kg (121 lb 14.6 oz) (07/28 0345) Weight change: -2.3 kg (-5 lb 1.1 oz)  Intake/Output from previous day: 07/27 0701 - 07/28 0700 In: 555.3 [I.V.:220.3; Blood:335] Out: 4012  Intake/Output this shift: No intake/output data recorded.  General appearance: acknowleges presence only, not coop Neck: trach Resp: rales bibasilar and wheezes bilaterally Cardio: S1, S2 normal and systolic murmur: holosystolic 2/6, blowing at apex GI: soft, non-tender; bowel sounds normal; no masses,  no organomegaly Extremities: AVF LUA infilt still swollen, but improved, B&T  Lab Results:  Recent Labs  02/29/16 0307 03/01/16 0223  WBC 10.0 11.0*  HGB 7.2* 10.3*  HCT 23.6* 33.2*  PLT 163 160   BMET:  Recent Labs  02/29/16 0307 03/01/16 0223  NA 134* 136  K 4.9 3.5  CL 93* 96*  CO2 30 30  GLUCOSE 91 110*  BUN 59* 16  CREATININE 3.69* 1.83*  CALCIUM 7.9* 8.2*   No results for input(s): PTH in the last 72 hours. Iron Studies: No results for input(s): IRON, TIBC, TRANSFERRIN, FERRITIN in the last 72 hours.  Studies/Results: Dg Chest Port 1 View  Result Date: 02/29/2016 CLINICAL DATA:  CHF . EXAM: PORTABLE CHEST 1 VIEW COMPARISON:  02/19/2016 . FINDINGS: Tracheostomy tube in stable position. Interim removal of feeding tube. Cardiomegaly with bilateral interstitial prominence and bilateral pleural effusions again noted consistent congestive heart failure. Similar findings noted on prior study bibasilar pneumonia cannot be excluded . Low lung volumes with basilar atelectasis. No  pneumothorax. Cervical spine fusion . IMPRESSION: 1. Interim removal of feeding tube. Tracheostomy tube in stable position. 2. Cardiomegaly with bilateral from interstitial prominence and bilateral effusions consistent congestive heart failure. Bibasilar pneumonia cannot be excluded. 3. Low lung volumes with basilar atelectasis . Electronically Signed   By: Maisie Fus  Register   On: 02/29/2016 07:58  Dg Abd Portable 1v  Result Date: 02/28/2016 CLINICAL DATA:  Recent feeding catheter placement EXAM: PORTABLE ABDOMEN - 1 VIEW COMPARISON:  02/26/2016 FINDINGS: Scattered large and small bowel gas is noted. Feeding catheter is noted coiled within the stomach. Bibasilar infiltrative changes are noted. IMPRESSION: Feeding catheter within the stomach. Electronically Signed   By: Alcide Clever M.D.   On: 02/28/2016 22:33   I have reviewed the patient's current medications.  Assessment/Plan: 1 ESRD low solute, xs vol.  CXR vol xs, lower each HD.  Caution with access 2 REsp failure lungs and some xs vol 3 Anemia stable 4 Confusion seems better 5 Trach 6 Debill per PT P HD to lower vol, mobilize , pulm toilet, esa,     LOS: 14 days   Arick Mareno L 03/01/2016,7:41 AM

## 2016-03-01 NOTE — Progress Notes (Signed)
PULMONARY / CRITICAL CARE MEDICINE   Name: Tanner Campbell MRN: [id number] DOB: 1975/11/29    ADMISSION DATE:  2016-03-06  REFERRING MD:  Sharyon Medicus -->McClung   CHIEF COMPLAINT:  Prolonged critical illness, failure to wean, recurrent sepsis. PCCM following for trach care.   HPI 40 year old male w/ ESRD whom we initially admitted to [facility] from [facility] where he was admitted for ongoing care for PNA and AECOPD. His course was complicated by: HCAP, AECOPD, bacteremia (veillonella species), NSTEMI, pericardial friction rub, GIB, and acute encephalopathy and now severe physical deconditioning and protein calorie malnutrition. Was transferred to [facility] per family request & on-going care. Eventually underwent trach on 7/18 was liberated from the vent and transferred to the IM service. He has been making slow progress. His trach now changed to 6 cuffless and ENT has singed off. PCCM was asked to assist w/ vent trach management and timing of decannulation.    SUBJECTIVE:  Afebrile Denies pain Doing well w/ ATC.   VITAL SIGNS: BP (!) 160/44   Pulse 94   Temp 98.4 F (36.9 C) (Oral)   Resp 17   Ht  (1.651 m)   Wt 55.3 kg (121 lb 14.6 oz)   SpO2 96%   BMI 20.29 kg/m   VENTILATOR SETTINGS: FiO2 (%):  [28 %] 28 %  INTAKE / OUTPUT: I/O last 3 completed shifts: In: 555.3 [I.V.:220.3; Blood:335] Out: 4012 [Other:4012]  PHYSICAL EXAMINATION: General: Awake, alert, in NAD Neuro: Follows commands, oriented , remains weak,mouths words HEENT: Trach in place, 6 cuffless. PMV voice quality is poor Cardiac: regular, friction rub present Chest: normal work of breathing, scattered rhonchi.  Abd: soft, non tender Ext: no edema, decreased muscle bulk Skin: multiple areas of healing excoriations on the lower extremities  LABS:  BMET  Recent Labs Lab 02/28/16 1245 02/29/16 0307 03/01/16 0223  NA 135  134* 134* 136  K 4.7  4.7 4.9 3.5  CL 98*  97* 93* 96*  CO2 BUN 50*  51*  59* 16  CREATININE 3.23*  3.27* 3.69* 1.83*  GLUCOSE 99  98 91 110*   CBC  Recent Labs Lab 02/28/16 1245 02/29/16 0307 03/01/16 0223  WBC 11.0* 10.0 11.0*  HGB 7.9* 7.2* 10.3*  HCT 25.8* 23.6* 33.2*  PLT 138* 163 160    STUDIES:  7/13 Echo >> EF 40 to 45%, mod LVH, small pericardial effusion 7/17 CXR >> stable bilateral pleural effusions, stable patchy opacities in the lung bases- pneumonia vs atelectasis ANA neg  CULTURES: BCX2 7/10: Veillonella species (Gram negative coccobacilli) resp culture 7/10: Moderate SA and E.coli (both pan sensitive)  ANTIBIOTICS: cipro 7/12>>>7/14 vanc 7/2>>>7/17 Cefepime 7/14>>>7/21 Fluconazole 7/7>>>7/17 Flagyl 7/17 >>  SIGNIFICANT EVENTS: 7/14 Transfer from [facility] to [facility] 7/18 Trach placed 7/22 off vent to SDU. 7/25 PCCM asked to assist w/ trach care    LINES/TUBES: OETT 7/10>>>7/17; 7/17 >>> Right IJ PICC (? 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    Plan Continue to work w/ SLP for improved voicee quality and swallowing Continue aggressive PT  Continue routine trach care.  When he is stronger & ambulating,we can initiate trach  capping trials to assess for decannulation   [facility] vs other [facility]F that accepts trach-HD PCCM will see again on Monday  Cyril Mourning MD. [facility]. [facility] Pager [phone] If no response call [phone]     03/01/2016 9:39 AM

## 2016-03-01 NOTE — Care Management Note (Signed)
Case Management Note  Patient Details  Name: Tanner Campbell MRN: [id number] Date of Birth: [birth year]  Subjective/Objective:    CM has located OP HD Center in [place] that would take pt if trach was capped and pt able to stand and transfer into chair.  [facility] liaison would consider offering bed if family agrees.  CM has attempted multiple times to discuss with sister but she states she wants pt to discharge to [facility] even though CSW informed her that he is not a candidate for that facility due to trach plus HD needs.  Staff RN asked pt if he would like to go to [facility] for rehab and pt said "yes."  CM has requested psych eval for competency.                         Expected Discharge Plan:  Skilled Nursing Facility  In-House Referral:  Clinical Social Work  Discharge planning Services  CM Consult  Status of Service:  In process, will continue to follow  Magdalene River, RN 03/01/2016, 3:20 PM

## 2016-03-01 NOTE — Progress Notes (Signed)
Patient seen by speech today for swallow eval. Stated she feels he is not ready due to sounding "wet"  and inability to cough forcefully enough to clear airway adequately. They will try to re-evaluate this weekend. Working with PT at this time. Will continue to monitor.

## 2016-03-01 NOTE — Plan of Care (Signed)
Problem: Pain Managment: Goal: General experience of comfort will improve Outcome: Progressing Pt is able to tell staff when he is experiencing pain now. Pt looking for adequate pain control for pain in lower back. Suggested moving, helped pt move to side and medicated him. Taught using non pharmacological means as well. Guided imagery, darkened room and enviornment

## 2016-03-01 NOTE — Care Management Important Message (Signed)
Important Message  Patient Details  Name: Raffaele Tronolone MRN: [id number] Date of Birth: 12/11/1975   Medicare Important Message Given:  Yes    Bernadette Hoit 03/01/2016, 10:26 AM

## 2016-03-01 NOTE — Progress Notes (Signed)
Speech Language Pathology Treatment: Dysphagia;Passy Muir Speaking valve  Patient Details Name: Tanner Campbell MRN: [id number] DOB: [birth year] Today's Date: 03/01/2016 Time: 9924-2683 SLP Time Calculation (min) (ACUTE ONLY): 13 min  Assessment / Plan / Recommendation Clinical Impression  Pt is more alert than during FEES on 7/26; suspect lethargy at that time was medication related. PMV was placed with good tolerance but with soft, hoarse phonation. PO trials of ice and nectar thick liquids resulted in wet vocal quality. No frank evidence of POs exiting the trach hub today, but his cued cough is weak and unable to return vocal quality to its baseline. He remains at heightened aspiration risk. Will continue to follow.   HPI HPI: 40 year old male admitted 02/29/2016 due to recurrent sepsis, failure to wean, multiple intubations. PMH significant forDM2, HTN, HLD, ESRD on HD, COPD. Trach placed March 16, 2016. Orders received for PMSV evaluation and BSE. Will hold BSE at this time, as pt is currently NPO for TEE later today.      SLP Plan  Continue with current plan of care     Recommendations  Diet recommendations: NPO Medication Administration: Via alternative means      Patient may use Passy-Muir Speech Valve: During all waking hours (remove during sleep) PMSV Supervision: Intermittent (close) MD: Please consider changing trach tube to : Smaller size      Oral Care Recommendations: Oral care QID Follow up Recommendations: Skilled Nursing facility Plan: Continue with current plan of care     GO                Maxcine Ham 03/01/2016, 10:17 AM  Maxcine Ham, M.A. CCC-SLP [phone]

## 2016-03-01 NOTE — Progress Notes (Signed)
RT attempted CPT per order.  Patient complained of severe lower back pain upon initiation of procedure.  SpO2 dropped to 91% from 95%.  Procedure stopped and vest removed.  RT will coordinate for further attempts, however, patient may benefit more from aggressive mobilization/PT.  RT will continue to monitor.

## 2016-03-02 ENCOUNTER — Inpatient Hospital Stay (HOSPITAL_COMMUNITY): Payer: Medicare Other

## 2016-03-02 LAB — BASIC METABOLIC PANEL
Anion gap: 13 (ref 5–15)
BUN: 26 mg/dL — AB (ref 6–20)
CHLORIDE: 94 mmol/L — AB (ref 101–111)
CO2: 26 mmol/L (ref 22–32)
Calcium: 8.4 mg/dL — ABNORMAL LOW (ref 8.9–10.3)
Creatinine, Ser: 2.97 mg/dL — ABNORMAL HIGH (ref 0.61–1.24)
GFR calc Af Amer: 29 mL/min — ABNORMAL LOW (ref >60)
GFR calc non Af Amer: 25 mL/min — ABNORMAL LOW (ref >60)
Glucose, Bld: 93 mg/dL (ref 65–99)
POTASSIUM: 4 mmol/L (ref 3.5–5.1)
SODIUM: 133 mmol/L — AB (ref 135–145)

## 2016-03-02 LAB — CBC WITH DIFFERENTIAL/PLATELET
Band Neutrophils: 5 %
HEMATOCRIT: 34.5 % — AB (ref 39.0–52.0)
HEMOGLOBIN: 10.7 g/dL — AB (ref 13.0–17.0)
Lymphocytes Relative: 5 %
MCH: 28.1 pg (ref 26.0–34.0)
MCHC: 31 g/dL (ref 30.0–36.0)
MCV: 90.6 fL (ref 78.0–100.0)
MONOS PCT: 1 %
Neutrophils Relative %: 89 %
Platelets: 177 10*3/uL (ref 150–400)
RBC: 3.81 MIL/uL — ABNORMAL LOW (ref 4.22–5.81)
RDW: 19.4 % — ABNORMAL HIGH (ref 11.5–15.5)
WBC: 13.2 10*3/uL — AB (ref 4.0–10.5)
nRBC: 1 /100 WBC — ABNORMAL HIGH

## 2016-03-02 LAB — GLUCOSE, CAPILLARY
GLUCOSE-CAPILLARY: 107 mg/dL — AB (ref 65–99)
GLUCOSE-CAPILLARY: 115 mg/dL — AB (ref 65–99)
GLUCOSE-CAPILLARY: 137 mg/dL — AB (ref 65–99)
GLUCOSE-CAPILLARY: 143 mg/dL — AB (ref 65–99)
GLUCOSE-CAPILLARY: 210 mg/dL — AB (ref 65–99)
Glucose-Capillary: 130 mg/dL — ABNORMAL HIGH (ref 65–99)

## 2016-03-02 LAB — MAGNESIUM: Magnesium: 2 mg/dL (ref 1.7–2.4)

## 2016-03-02 LAB — TROPONIN I: Troponin I: 0.07 ng/mL (ref <0.03)

## 2016-03-02 MED ORDER — FUROSEMIDE 10 MG/ML IJ SOLN
80.0000 mg | Freq: Two times a day (BID) | INTRAMUSCULAR | Status: DC
  Administered 2016-03-02: 80 mg via INTRAVENOUS
  Filled 2016-03-02: qty 8

## 2016-03-02 MED ORDER — HEPARIN SODIUM (PORCINE) 1000 UNIT/ML DIALYSIS: 1000.0000 [IU] | INTRAMUSCULAR | Status: DC | PRN

## 2016-03-02 MED ORDER — PENTAFLUOROPROP-TETRAFLUOROETH EX AERO: 1.0000 "application " | INHALATION_SPRAY | CUTANEOUS | Status: DC | PRN

## 2016-03-02 MED ORDER — LIDOCAINE HCL (PF) 1 % IJ SOLN: 5.0000 mL | INTRAMUSCULAR | Status: DC | PRN

## 2016-03-02 MED ORDER — MORPHINE SULFATE (PF) 2 MG/ML IV SOLN
INTRAVENOUS | Status: AC
  Filled 2016-03-02: qty 1

## 2016-03-02 MED ORDER — INSULIN ASPART 100 UNIT/ML ~~LOC~~ SOLN
0.0000 [IU] | SUBCUTANEOUS | Status: DC
  Administered 2016-03-03: 2 [IU] via SUBCUTANEOUS
  Administered 2016-03-04 – 2016-03-07 (×5): 1 [IU] via SUBCUTANEOUS
  Administered 2016-03-07 – 2016-03-08 (×4): 2 [IU] via SUBCUTANEOUS
  Administered 2016-03-08: 1 [IU] via SUBCUTANEOUS
  Administered 2016-03-09 (×2): 2 [IU] via SUBCUTANEOUS
  Administered 2016-03-09: 1 [IU] via SUBCUTANEOUS
  Administered 2016-03-09 (×2): 2 [IU] via SUBCUTANEOUS
  Administered 2016-03-10 (×2): 1 [IU] via SUBCUTANEOUS
  Administered 2016-03-10 – 2016-03-11 (×4): 2 [IU] via SUBCUTANEOUS
  Administered 2016-03-11: 1 [IU] via SUBCUTANEOUS
  Administered 2016-03-12 (×2): 2 [IU] via SUBCUTANEOUS
  Administered 2016-03-13 (×3): 1 [IU] via SUBCUTANEOUS
  Administered 2016-03-15: 3 [IU] via SUBCUTANEOUS
  Administered 2016-03-15: 1 [IU] via SUBCUTANEOUS
  Administered 2016-03-15: 3 [IU] via SUBCUTANEOUS
  Administered 2016-03-15: 1 [IU] via SUBCUTANEOUS
  Administered 2016-03-16: 9 [IU] via SUBCUTANEOUS
  Administered 2016-03-16 (×2): 1 [IU] via SUBCUTANEOUS
  Administered 2016-03-16: 5 [IU] via SUBCUTANEOUS
  Administered 2016-03-17: 3 [IU] via SUBCUTANEOUS
  Administered 2016-03-17: 1 [IU] via SUBCUTANEOUS
  Administered 2016-03-17: 9 [IU] via SUBCUTANEOUS
  Administered 2016-03-17: 5 [IU] via SUBCUTANEOUS
  Administered 2016-03-17 (×2): 3 [IU] via SUBCUTANEOUS
  Administered 2016-03-18: 1 [IU] via SUBCUTANEOUS
  Administered 2016-03-18 (×3): 2 [IU] via SUBCUTANEOUS
  Administered 2016-03-19 (×2): 1 [IU] via SUBCUTANEOUS
  Administered 2016-03-19 (×2): 5 [IU] via SUBCUTANEOUS
  Administered 2016-03-19: 7 [IU] via SUBCUTANEOUS
  Administered 2016-03-20 (×2): 5 [IU] via SUBCUTANEOUS
  Administered 2016-03-20: 2 [IU] via SUBCUTANEOUS
  Administered 2016-03-20: 1 [IU] via SUBCUTANEOUS
  Administered 2016-03-20: 3 [IU] via SUBCUTANEOUS
  Administered 2016-03-20: 2 [IU] via SUBCUTANEOUS
  Administered 2016-03-21: 7 [IU] via SUBCUTANEOUS
  Administered 2016-03-21: 3 [IU] via SUBCUTANEOUS
  Administered 2016-03-21: 2 [IU] via SUBCUTANEOUS
  Administered 2016-03-21: 3 [IU] via SUBCUTANEOUS
  Administered 2016-03-22 (×2): 2 [IU] via SUBCUTANEOUS
  Administered 2016-03-22: 3 [IU] via SUBCUTANEOUS

## 2016-03-02 MED ORDER — ALTEPLASE 2 MG IJ SOLR: 2.0000 mg | Freq: Once | INTRAMUSCULAR | Status: DC | PRN

## 2016-03-02 MED ORDER — LIDOCAINE-PRILOCAINE 2.5-2.5 % EX CREA: 1.0000 "application " | TOPICAL_CREAM | CUTANEOUS | Status: DC | PRN

## 2016-03-02 MED ORDER — GABAPENTIN 250 MG/5ML PO SOLN
300.0000 mg | Freq: Two times a day (BID) | ORAL | Status: DC
  Administered 2016-03-02 – 2016-03-10 (×12): 300 mg via ORAL
  Filled 2016-03-02 (×18): qty 6

## 2016-03-02 MED ORDER — HEPARIN SODIUM (PORCINE) 1000 UNIT/ML DIALYSIS: 40.0000 [IU]/kg | Freq: Once | INTRAMUSCULAR | Status: DC

## 2016-03-02 MED ORDER — SODIUM CHLORIDE 0.9 % IV SOLN: 100.0000 mL | INTRAVENOUS | Status: DC | PRN

## 2016-03-02 NOTE — Progress Notes (Signed)
Advanced cortrak tube another 10-15 cm.   RN to re-order imaging  Christophe Louis RD, LDN, CNSC Clinical Nutrition Pager: [phone] 03/02/2016 12:14 PM

## 2016-03-02 NOTE — Progress Notes (Signed)
Pt transferred to rotating air mattress bed, tolerated well.

## 2016-03-02 NOTE — Progress Notes (Signed)
RN witnessed pt take off sacral foam dressing and scratch pressure ulcers with fingernails. Cleansed and new dressings applied to both sacral and ischial ulcers.

## 2016-03-02 NOTE — Progress Notes (Signed)
CRITICAL VALUE ALERT  Critical value received:  Troponin 0.07  Date of notification:  03/02/16   Time of notification:  2146  Critical value read back:Yes.    Nurse who received alert:  Reap, Randon Goldsmith   MD notified (1st page):  Schorr, NP  Time of first page:  2159  MD notified (2nd page):  Time of second page:  Responding MD:    Time MD responded:

## 2016-03-02 NOTE — Progress Notes (Signed)
PROGRESS NOTE    Tanner Campbell  SAY:[id number] DOB: 01-15-1976 DOA: 02/12/2016 PCP: No primary care provider on file.   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  He decompensated 6/23 and required intubation. The patient was found to have H. Influenza positive sputum and treated with Rocephin. CXR demonstrated a large right pleural effusion and he underwent a thoracentesis (1L serous fluid removed - exudative by protein). He had difficulty with weaning and was transferred to [facility] on 6/29 orally intubated for ventilator weaning. The patient developed fever and antibiotics were expanded to vancomycin, cefepime and diflucan. He had progressed to weaning on PSV x 4 hours as of 7/3. He self extubated on 7/5. Since that time he required BiPAP off and on. After HD 7/10 his mental status worsened and he became minimally responsive. CXR was consistent with RLL opacification and he was re-intubated. He was re-cultured and also found to have increased Troponin for which Cardiology was consulted. He was placed on LMWH, but had a GIB while on this w/ his hgb dropping as low as 8.4.  On 7/14 he remained ventilator dependent. His sputum cultures grew MSSA and E coli and he was noted to have + blood cultures. Because of these acutely worsening issues it was felt best to transfer him to the ICU at [facility].   Subjective: 7/29 A/O 4, able to speak with Passy-Muir valve in place. 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 Clinically patient improved will hold on starting any antibiotics. If patient develops fever/bands/left shift will consider restarting. -Physiotherapy vest BID  Bacteremia with positive Veillonella species (Gram negative rod) -continue Flagyl x 2 weeks for bacteremia as per ID recs   Bilateral pleural effusions s/p thoracentesis -See acute hypoxic respiratory failure -Lasix IV 80 mgBID  NSTEMI vs demand ischemia -Cardiology suggest he will need an eventual cardiac cath  -7/27 spoke with Dr. Lyman Bishop cardiology and we agreed would perform cardiac cath early next week. Continue to maximize respiratory status -Strict in and out since admission +4.3 L -Daily weight Filed Weights   02/29/16 0805 03/01/16 0345 03/02/16 0344  Weight: 57.7 kg (127 lb 3.3 oz) 55.3 kg (121 lb 14.6 oz) 54 kg (119 lb 0.8 oz)  -Manage fluids per HD;See pleural effusion   Small pericardial effusion with friction rub: Pericarditis? -Cardiology following  -  ANA negative  - TEE without evidence of endocarditis/vegetations  - 7/27 EKG: Not consistent with pericarditis. - ESR, CRP significantly elevated. Nonspecific considering patient's multiple other medical problems to include renal failure. Will hold on increasing aspirin at this time.  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, Th, S) -Nephrology following   Recent GIB on LMWH -No evidence of acute blood loss a this time,  Anemia of chronic kidney disease + acute blood loss  No evidence of blood loss, but Hgb drifted down to 7.1  - 7/24 transfuse 1U PRBC  -7/27 transfused 1 unit PRBC  DM type II uncontrolled with complications  -5/05 Hemoglobin A1c= 7.7 -Sensitive SSI  Acute Encephalopathy in setting of critical illness Appears to be slowly improving - attempt to avoid sedatives and follow   Severe malnutrition in context of acute illness -7/29 CorTrak tube placed again -Restart tube feeds, Nephro Carb Steady 47m/hr  Chronic back pain -Morphine -Neurontin BID -Air mattress, ensure on rotation at all times    Goals of care -PT recommended CIR  - CIR felt he is more appropriate for LTACH - sister does not want him to go back to [facility] because she didn't feel he was being treated well - plan is for SNF placement, but will have to be decannulated as he is ESRD and requires HD and there are no area SNFs that accept ESRD AND trach patients   -Consult psychiatry on 7/29 to ensure patient has competency. Patient's family unrealistic in their expectations therefore would like patient to make decisions if deemed competent     DVT prophylaxis: Subcutaneous heparin Code Status: Full Family Communication: None Disposition Plan: Vent SNF   Consultants:  PCCM Dr. KLyman BishopCardiology  [facility] Nephrology  [facility]   Procedures/Significant Events:  7/21 TEE;- No evidence of endocarditis.-Negative vegetation- Aortic insufficiency is seen, with a central jet. It   is likely due to degenerative changes.   7/24 transfuse 1U PRBC 7/27 PCXR: Cardiomegaly with bilateral from interstitial prominence and bilateral effusions C/W CHF, PNA cannot be excluded.- basilar atelectasis  7/27 transfused 1 unit PRBC    Cultures 7/14 MRSA by PCR negative 7/18 blood right hand x2  pending    Antimicrobials: Cipro 7/12>7/14 Vanc 7/2>7/17 Cefepime 7/14>7/21 Fluconazole 7/7>7/17 Flagyl 7/17 >   Devices 7/25 uncuffed #6 Shiley>>   LINES / TUBES:  7/29 CorTrak tube placed    Continuous Infusions: . dextrose 50 mL/hr at 03/01/16 2056         Objective: Vitals:   03/02/16 0500 03/02/16 0739 03/02/16 0742 03/02/16 0744  BP: (!) 164/48  (!) 156/35   Pulse: 96  95 98  Resp: 12  16 20   Temp:   98.1 F (36.7 C)   TempSrc:   Oral   SpO2: 98% 98% 100% 98%  Weight:      Height:        Intake/Output Summary (Last 24 hours) at 03/02/16 0855 Last data filed at 03/02/16 0600  Gross per 24 hour  Intake             1450 ml  Output                0 ml  Net             1450 ml   Filed Weights   02/29/16 0805 03/01/16 0345 03/02/16 0344  Weight: 57.7 kg (127 lb 3.3 oz) 55.3 kg (121 lb 14.6 oz)  54 kg (119 lb 0.8 oz)    Examination:  General: A/O 4 , follows commands, positive acute respiratory distress (improving) Eyes: negative scleral hemorrhage, negative anisocoria, negative icterus ENT: Negative Runny nose, negative gingival bleeding, Neck:  Negative scars, masses, torticollis, lymphadenopathy, JVD Lungs: diffuse rhonchi (improved from 7/27) Lt >>Rt, negative wheezes or crackles Cardiovascular:  Regular rhythm and rate without murmur gallop or rub normal S1 and S2 Abdomen: negative abdominal pain, nondistended, positive soft, bowel sounds, no rebound, no ascites, no appreciable mass Extremities: No significant cyanosis, clubbing, or edema bilateral lower extremities Skin: Negative rashes, lesions, ulcers Psychiatric:  Unable to evaluate  Central nervous system:  Cranial nerves II through XII intact, follows commands moves extremities   .     Data Reviewed: Care during the described time interval was provided by me .  I have reviewed this patient's available data, including medical history, events of note, physical examination, and all test  results as part of my evaluation. I have personally reviewed and interpreted all radiology studies.  CBC:  Recent Labs Lab 02/27/16 1130 02/28/16 1245 02/29/16 0307 03/01/16 0223 03/02/16 0351  WBC 9.2 11.0* 10.0 11.0* 13.2*  NEUTROABS  --   --  7.8* 9.1*  --   HGB 7.8* 7.9* 7.2* 10.3* 10.7*  HCT 24.5* 25.8* 23.6* 33.2* 34.5*  MCV 87.8 89.6 90.4 89.2 90.6  PLT 116* 138* 163 160 280   Basic Metabolic Panel:  Recent Labs Lab 02/25/16 0300 02/26/16 1406 02/27/16 1130 02/28/16 1245 02/29/16 0307 03/01/16 0223 03/02/16 0351  NA 139 136 138 135  134* 134* 136 133*  K 4.2 4.3 4.8 4.7  4.7 4.9 3.5 4.0  CL 104 102 100* 98*  97* 93* 96* 94*  CO2 26 23 25 27  27 30 30 26   GLUCOSE 189* 137* 119* 99  98 91 110* 93  BUN 43* 70* 69* 50*  51* 59* 16 26*  CREATININE 3.22* 4.37* 4.17* 3.23*  3.27* 3.69* 1.83* 2.97*  CALCIUM 7.7* 7.5* 7.7* 7.7*  7.7* 7.9* 8.2* 8.4*  MG  --   --   --  1.9 2.0 1.9 2.0  PHOS 3.3 3.5 3.8 3.9  --   --   --    GFR: Estimated Creatinine Clearance: 25 mL/min (by C-G formula based on SCr of 2.97 mg/dL). Liver Function Tests:  Recent Labs Lab 02/25/16 0300 02/26/16 1406 02/27/16 1130 02/28/16 1245  ALBUMIN 1.8* 1.6* 1.6* 1.7*   No results for input(s): LIPASE, AMYLASE in the last 168 hours. No results for input(s): AMMONIA in the last 168 hours. Coagulation Profile: No results for input(s): INR, PROTIME in the last 168 hours. Cardiac Enzymes:  Recent Labs Lab 02/25/16 0300  TROPONINI 0.29*   BNP (last 3 results) No results for input(s): PROBNP in the last 8760 hours. HbA1C: No results for input(s): HGBA1C in the last 72 hours. CBG:  Recent Labs Lab 03/01/16 1220 03/01/16 1621 03/01/16 1947 03/02/16 0033 03/02/16 0343  GLUCAP 112* 125* 131* 210* 107*   Lipid Profile: No results for input(s): CHOL, HDL, LDLCALC, TRIG, CHOLHDL, LDLDIRECT in the last 72 hours. Thyroid Function Tests: No results for input(s): TSH, T4TOTAL,  FREET4, T3FREE, THYROIDAB in the last 72 hours. Anemia Panel: No results for input(s): VITAMINB12, FOLATE, FERRITIN, TIBC, IRON, RETICCTPCT in the last 72 hours. Urine analysis:    Component Value Date/Time   COLORURINE RED (A) 02/04/2016 1426   APPEARANCEUR CLOUDY (A) 02/04/2016 1426   LABSPEC 1.025 02/04/2016 1426  PHURINE 7.5 02/04/2016 1426   GLUCOSEU 500 (A) 02/04/2016 1426   HGBUR SMALL (A) 02/04/2016 1426   BILIRUBINUR SMALL (A) 02/04/2016 1426   KETONESUR NEGATIVE 02/04/2016 1426   PROTEINUR >300 (A) 02/04/2016 1426   NITRITE NEGATIVE 02/04/2016 1426   LEUKOCYTESUR TRACE (A) 02/04/2016 1426   Sepsis Labs: @LABRCNTIP (procalcitonin:4,lacticidven:4)  ) No results found for this or any previous visit (from the past 240 hour(s)).       Radiology Studies: No results found.      Scheduled Meds: . sodium chloride   Intravenous Once  . acetylcysteine  4 mL Nebulization QID  . antiseptic oral rinse  7 mL Mouth Rinse QID  . aspirin  81 mg Per Tube Daily  . atorvastatin  80 mg Per Tube q1800  . chlorhexidine gluconate (SAGE KIT)  15 mL Mouth Rinse BID  . darbepoetin (ARANESP) injection - DIALYSIS  200 mcg Intravenous Q Thu-HD  . feeding supplement (NEPRO CARB STEADY)  1,000 mL Per Tube Q24H  . feeding supplement (PRO-STAT SUGAR FREE 64)  30 mL Per Tube Daily  . ferric gluconate (FERRLECIT/NULECIT) IV  125 mg Intravenous Q T,Th,Sa-HD  . heparin subcutaneous  5,000 Units Subcutaneous Q8H  . insulin aspart  0-9 Units Subcutaneous Q4H  . ipratropium-albuterol  3 mL Nebulization QID  . isosorbide dinitrate  10 mg Per Tube TID  . metoprolol tartrate  25 mg Per Tube BID  . metroNIDAZOLE  500 mg Per Tube TID  . multivitamin  1 tablet Oral QHS  . pantoprazole sodium  40 mg Per Tube Daily   Continuous Infusions: . dextrose 50 mL/hr at 03/01/16 2056     LOS: 15 days    Time spent: 40 minutes    Deneshia Zucker, Geraldo Docker, MD [facility] Pager [phone]   If  7PM-7AM, please contact night-coverage [email or web address] Password [facility] 03/02/2016, 8:55 AM

## 2016-03-02 NOTE — Progress Notes (Signed)
Subjective: Interval History: has no complaint .  Objective: Vital signs in last 24 hours: Temp:  [97.5 F (36.4 C)-98.4 F (36.9 C)] 98.1 F (36.7 C) (07/29 0344) Pulse Rate:  [82-115] 96 (07/29 0500) Resp:  [6-22] 12 (07/29 0500) BP: (97-187)/(38-75) 164/48 (07/29 0500) SpO2:  [93 %-100 %] 98 % (07/29 0500) FiO2 (%):  [28 %] 28 % (07/29 0400) Weight:  [54 kg (119 lb 0.8 oz)] 54 kg (119 lb 0.8 oz) (07/29 0344) Weight change: -3.7 kg (-8 lb 2.5 oz)  Intake/Output from previous day: 07/28 0701 - 07/29 0700 In: 1450 [I.V.:1450] Out: -  Intake/Output this shift: No intake/output data recorded.  General appearance: awake, not coop,  Neck: trach Resp: rhonchi bilaterally Cardio: systolic murmur: holosystolic 2/6, blowing at apex and friction rub heard 2 component GI: soft, non-tender; bowel sounds normal; no masses,  no organomegaly Extremities: avf LUA , infilt resolving,   Lab Results:  Recent Labs  03/01/16 0223 03/02/16 0351  WBC 11.0* 13.2*  HGB 10.3* 10.7*  HCT 33.2* 34.5*  PLT 160 177   BMET:  Recent Labs  03/01/16 0223 03/02/16 0351  NA 136 133*  K 3.5 4.0  CL 96* 94*  CO2 30 26  GLUCOSE 110* 93  BUN 16 26*  CREATININE 1.83* 2.97*  CALCIUM 8.2* 8.4*   No results for input(s): PTH in the last 72 hours. Iron Studies: No results for input(s): IRON, TIBC, TRANSFERRIN, FERRITIN in the last 72 hours.  Studies/Results: Dg Chest Port 1 View  Result Date: 02/29/2016 CLINICAL DATA:  CHF . EXAM: PORTABLE CHEST 1 VIEW COMPARISON:  03/03/2016 . FINDINGS: Tracheostomy tube in stable position. Interim removal of feeding tube. Cardiomegaly with bilateral interstitial prominence and bilateral pleural effusions again noted consistent congestive heart failure. Similar findings noted on prior study bibasilar pneumonia cannot be excluded . Low lung volumes with basilar atelectasis. No pneumothorax. Cervical spine fusion . IMPRESSION: 1. Interim removal of feeding tube.  Tracheostomy tube in stable position. 2. Cardiomegaly with bilateral from interstitial prominence and bilateral effusions consistent congestive heart failure. Bibasilar pneumonia cannot be excluded. 3. Low lung volumes with basilar atelectasis . Electronically Signed   By: Maisie Fus  Register   On: 02/29/2016 07:58   I have reviewed the patient's current medications.  Assessment/Plan: 1 ESRD solute load low but very low Ms mass. Vol xs for HD today, restrain arm with confusion 2 Anemia stable esa, Fe 3 Resp secretions, vol, trach 4 Confusion intermittent 5 Debill severe 6 MS 7 Nutrition  P HD, esa, pulm toilet.,     LOS: 15 days   Trenae Brunke L 03/02/2016,7:07 AM

## 2016-03-02 NOTE — Progress Notes (Signed)
Safety mittens placed due to multiple lacerations caused by  scratching.

## 2016-03-02 NOTE — Progress Notes (Signed)
Speech Language Pathology Treatment: [place] Bow Speaking valve  Patient Details Name: Demarus Latterell MRN: [id number] DOB: [birth year] Today's Date: 03/02/2016 Time: 4098-1191 SLP Time Calculation (min) (ACUTE ONLY): 35 min  Assessment / Plan / Recommendation Clinical Impression  Patient seen to address PMV goals. Intent was to also address dysphagia goals with ice chip trials, however patient was preoccupied by requesting bed pan and urinal, and overall attention was not appropriate for PO trials during this session. Patient tolerated PMV placement for 25 minutes, with no observed difficulty, no evidence of CO2 trapping, and no changes in SpO2, RR or HR. Patient was able to phonate with mod-maximal cues to initiate, and voice quality was low, hoarse and strained. Patient appeared confused, as he asked clinician for the urinal when it was already in place, and when asked if he was still "trying to go?", he would reply, "here it comes", however he did not have any urine output and did not have a bowel movement. Patient's attention would fluctuate and at times, he would have his eyes closed and require verbal redirection, but he did not appear to be lethargic or sleepy.   HPI HPI: 40 year old male admitted 02/15/2016 due to recurrent sepsis, failure to wean, multiple intubations. PMH significant forDM2, HTN, HLD, ESRD on HD, COPD. Trach placed 02/28/2016. Orders received for PMSV evaluation and BSE. Will hold BSE at this time, as pt is currently NPO for TEE later today.      SLP Plan  Continue with current plan of care     Recommendations  Diet recommendations: NPO Medication Administration: Via alternative means      Patient may use Passy-Muir Speech Valve: During all waking hours (remove during sleep) PMSV Supervision: Intermittent MD: Please consider changing trach tube to : Smaller size      Oral Care Recommendations: Oral care QID Follow up Recommendations: Skilled Nursing facility Plan:  Continue with current plan of care           Angela Nevin, MA, CCC-SLP 03/02/16 4:35 PM

## 2016-03-03 ENCOUNTER — Inpatient Hospital Stay (HOSPITAL_COMMUNITY): Payer: Medicare Other

## 2016-03-03 DIAGNOSIS — M549 Dorsalgia, unspecified: Secondary | ICD-10-CM

## 2016-03-03 DIAGNOSIS — G8929 Other chronic pain: Secondary | ICD-10-CM

## 2016-03-03 DIAGNOSIS — R1310 Dysphagia, unspecified: Secondary | ICD-10-CM

## 2016-03-03 LAB — GLUCOSE, CAPILLARY
GLUCOSE-CAPILLARY: 90 mg/dL (ref 65–99)
Glucose-Capillary: 104 mg/dL — ABNORMAL HIGH (ref 65–99)
Glucose-Capillary: 158 mg/dL — ABNORMAL HIGH (ref 65–99)
Glucose-Capillary: 104 mg/dL — ABNORMAL HIGH (ref 65–99)
Glucose-Capillary: 122 mg/dL — ABNORMAL HIGH (ref 65–99)

## 2016-03-03 LAB — BASIC METABOLIC PANEL
ANION GAP: 12 (ref 5–15)
BUN: 20 mg/dL (ref 6–20)
CO2: 31 mmol/L (ref 22–32)
Calcium: 8.3 mg/dL — ABNORMAL LOW (ref 8.9–10.3)
Chloride: 91 mmol/L — ABNORMAL LOW (ref 101–111)
Creatinine, Ser: 2.62 mg/dL — ABNORMAL HIGH (ref 0.61–1.24)
GFR calc Af Amer: 33 mL/min — ABNORMAL LOW (ref >60)
GFR, EST NON AFRICAN AMERICAN: 29 mL/min — AB (ref >60)
GLUCOSE: 147 mg/dL — AB (ref 65–99)
POTASSIUM: 3.3 mmol/L — AB (ref 3.5–5.1)
SODIUM: 134 mmol/L — AB (ref 135–145)

## 2016-03-03 LAB — CBC WITH DIFFERENTIAL/PLATELET
BASOS ABS: 0 10*3/uL (ref 0.0–0.1)
Basophils Relative: 0 %
EOS PCT: 1 %
Eosinophils Absolute: 0 10*3/uL (ref 0.0–0.7)
HCT: 32.7 % — ABNORMAL LOW (ref 39.0–52.0)
HEMOGLOBIN: 10 g/dL — AB (ref 13.0–17.0)
LYMPHS PCT: 10 %
Lymphs Abs: 0.7 10*3/uL (ref 0.7–4.0)
MCH: 28.4 pg (ref 26.0–34.0)
MCHC: 30.6 g/dL (ref 30.0–36.0)
MCV: 92.9 fL (ref 78.0–100.0)
Monocytes Absolute: 0.7 10*3/uL (ref 0.1–1.0)
Monocytes Relative: 10 %
NEUTROS PCT: 79 %
Neutro Abs: 5.6 10*3/uL (ref 1.7–7.7)
PLATELETS: 213 10*3/uL (ref 150–400)
RBC: 3.52 MIL/uL — AB (ref 4.22–5.81)
RDW: 19.9 % — ABNORMAL HIGH (ref 11.5–15.5)
WBC: 7 10*3/uL (ref 4.0–10.5)

## 2016-03-03 LAB — MAGNESIUM: MAGNESIUM: 2.1 mg/dL (ref 1.7–2.4)

## 2016-03-03 LAB — TROPONIN I
Troponin I: 0.07 ng/mL (ref <0.03)
Troponin I: 0.07 ng/mL (ref <0.03)

## 2016-03-03 MED ORDER — FUROSEMIDE 10 MG/ML IJ SOLN
80.0000 mg | Freq: Two times a day (BID) | INTRAMUSCULAR | Status: DC
  Administered 2016-03-03: 80 mg via INTRAVENOUS
  Filled 2016-03-03: qty 8

## 2016-03-03 MED ORDER — METOPROLOL TARTRATE 25 MG/10 ML ORAL SUSPENSION
12.5000 mg | Freq: Two times a day (BID) | ORAL | Status: DC
  Administered 2016-03-03 – 2016-03-04 (×4): 12.5 mg
  Filled 2016-03-03 (×4): qty 10

## 2016-03-03 NOTE — Progress Notes (Signed)
Pt refusing CPT 

## 2016-03-03 NOTE — Progress Notes (Signed)
Subjective: Interval History: has no complaint , lethagic , less responsive but coop.  Objective: Vital signs in last 24 hours: Temp:  [98 F (36.7 C)-98.4 F (36.9 C)] 98.2 F (36.8 C) (07/30 0400) Pulse Rate:  [74-103] 77 (07/30 0732) Resp:  [9-28] 11 (07/30 0732) BP: (86-185)/(35-92) 106/42 (07/30 0519) SpO2:  [95 %-100 %] 98 % (07/30 0732) FiO2 (%):  [28 %] 28 % (07/30 0732) Weight:  [53.6 kg (118 lb 2.7 oz)-57.6 kg (127 lb)] 57.6 kg (127 lb) (07/30 0358) Weight change: -0.4 kg (-14.1 oz)  Intake/Output from previous day: 07/29 0701 - 07/30 0700 In: 15.3 [NG/GT:15.3] Out: 2619  Intake/Output this shift: No intake/output data recorded.  General appearance: cooperative, syndromic appearance - only takes deep breaths, not other coop and sllpy Neck: Trach Resp: rales bibasilar and rhonchi bilaterally Cardio: S1, S2 normal and systolic murmur: holosystolic 2/6, blowing at apex GI: soft, liver down 3 cm Extremities: AVF LUA B&T, infilt resolving  Lab Results:  Recent Labs  03/01/16 0223 03/02/16 0351  WBC 11.0* 13.2*  HGB 10.3* 10.7*  HCT 33.2* 34.5*  PLT 160 177   BMET:  Recent Labs  03/01/16 0223 03/02/16 0351  NA 136 133*  K 3.5 4.0  CL 96* 94*  CO2 30 26  GLUCOSE 110* 93  BUN 16 26*  CREATININE 1.83* 2.97*  CALCIUM 8.2* 8.4*   No results for input(s): PTH in the last 72 hours. Iron Studies: No results for input(s): IRON, TIBC, TRANSFERRIN, FERRITIN in the last 72 hours.  Studies/Results: Dg Chest Port 1 View  Result Date: 03/02/2016 CLINICAL DATA:  Pneumonia, tracheostomy EXAM: PORTABLE CHEST 1 VIEW COMPARISON:  02/29/2016 FINDINGS: Tracheostomy tube remains in place, unchanged. Cardiomegaly is stable. Bilateral lower lobe airspace opacities and effusions again noted, stable. IMPRESSION: No significant change in bilateral lower lobe opacities and effusions. Electronically Signed   By: Charlett Nose M.D.   On: 03/02/2016 09:08  Dg Abd Portable  1v  Result Date: 03/02/2016 CLINICAL DATA:  NG tube advanced EXAM: PORTABLE ABDOMEN - 1 VIEW COMPARISON:  03/02/2016 FINDINGS: Enteric tube has been advanced into the proximal to mid stomach. Nonobstructive bowel gas pattern. Prior cholecystectomy. IMPRESSION: Tube advanced into the proximal to mid stomach. Electronically Signed   By: Charlett Nose M.D.   On: 03/02/2016 12:37  Dg Abd Portable 1v  Result Date: 03/02/2016 CLINICAL DATA:  Evaluate NG tube. EXAM: PORTABLE ABDOMEN - 1 VIEW COMPARISON:  None. FINDINGS: The distal tip of the feeding tube is near the GE junction. Recommend advancement. Bibasilar opacities remain. IMPRESSION: The feeding tube terminates in the distal esophagus. Recommend repositioning. These results will be called to the ordering clinician or representative by the Radiologist Assistant, and communication documented in the PACS or zVision Dashboard. Electronically Signed   By: Gerome Sam III M.D   On: 03/02/2016 10:55   I have reviewed the patient's current medications.  Assessment/Plan: 1 ESRD  Did well on HD. Bps low limiting vol off 2 BP lower metop 3 effusions slowly lower vol 4 Pericarditis do not hear rub today 5 REsp failure/trach secretions 6 Nutrition FT 7 Debill 8 anemia esa,/Fe 9 CP per Cards P HD on TTS, lower metop, ? Cath, simplify regimen   LOS: 16 days   Nitika Jackowski L 03/03/2016,7:38 AM

## 2016-03-03 NOTE — Progress Notes (Signed)
PROGRESS NOTE    Fard Borunda  JOA:[id number] DOB: 08-29-1975 DOA: 02/15/2016 PCP: No primary care provider on file.   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  He decompensated 6/23 and required intubation. The patient was found to have H. Influenza positive sputum and treated with Rocephin. CXR demonstrated a large right pleural effusion and he underwent a thoracentesis (1L serous fluid removed - exudative by protein). He had difficulty with weaning and was transferred to [facility] on 6/29 orally intubated for ventilator weaning. The patient developed fever and antibiotics were expanded to vancomycin, cefepime and diflucan. He had progressed to weaning on PSV x 4 hours as of 7/3. He self extubated on 7/5. Since that time he required BiPAP off and on. After HD 7/10 his mental status worsened and he became minimally responsive. CXR was consistent with RLL opacification and he was re-intubated. He was re-cultured and also found to have increased Troponin for which Cardiology was consulted. He was placed on LMWH, but had a GIB while on this w/ his hgb dropping as low as 8.4.  On 7/14 he remained ventilator dependent. His sputum cultures grew MSSA and E coli and he was noted to have + blood cultures. Because of these acutely worsening issues it was felt best to transfer him to the ICU at [facility].   Subjective: 7/30 A/O 4, able to speak with Passy-Muir valve in place. 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 Clinically patient improved will hold on starting any antibiotics. If patient develops fever/bands/left shift will consider restarting. -Physiotherapy vest BID -Resolving  Tracheostomy -Counseled placed consult respiratory therapy continue to attempt to progress patient daily toward decannulation  Bacteremia with positive Veillonella species (Gram negative rod) -continue Flagyl x 2 weeks for bacteremia as per ID recs   Leukocytosis -Afebrile but WBC count increasing pancultured, did not add any additional antibiotic agent  Bilateral pleural effusions s/p thoracentesis -See acute hypoxic respiratory failure -Lasix IV 80 mgBID  NSTEMI vs demand ischemia -Cardiology suggest he will need an eventual cardiac cath  -7/27 spoke with Dr. Lyman Bishop cardiology and we agreed would perform cardiac cath early next week. Continue to maximize respiratory status -Strict in and out since admission + 2.9 L -Daily weight Filed Weights   03/02/16 0344 03/02/16 1527 03/03/16 0358  Weight: 54 kg (119 lb  0.8 oz) 53.6 kg (118 lb 2.7 oz) 57.6 kg (127 lb)  -Manage fluids per HD;See pleural effusion   Small pericardial effusion with friction rub: Pericarditis? -Cardiology following  - ANA negative  - TEE without evidence of endocarditis/vegetations  - 7/27 EKG: Not consistent with  pericarditis. - ESR, CRP significantly elevated. Nonspecific considering patient's multiple other medical problems to include renal failure. Will hold on increasing aspirin at this time.  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, Th, S) -Nephrology following   Recent GIB on LMWH -No evidence of acute blood loss a this time,  Anemia of chronic kidney disease + acute blood loss  No evidence of blood loss, but Hgb drifted down to 7.1  - 7/24 transfuse 1U PRBC  -7/27 transfused 1 unit PRBC  DM type II uncontrolled with complications  -1/76 Hemoglobin A1c= 7.7 -Sensitive SSI  Acute Encephalopathy in setting of critical illness -Resolved  Severe malnutrition in context of acute illness -7/29 CorTrak tube placed again -Restart tube feeds, Nephro Carb Steady 33m/hr  Dysphagia -Swallow study Reevaluation on 8/1  Chronic back pain -Morphine -Neurontin BID -Air mattress, ensure on rotation at all times    Goals of care -PT recommended CIR  - CIR felt he is more appropriate for LTACH - sister does not want him to go back to [facility] because she didn't feel he was being treated well - plan is for SNF placement, but will have to be decannulated as he is ESRD and requires HD and there are no area SNFs that accept ESRD AND trach patients   -Consult psychiatry on 7/29 to ensure patient has competency. Patient's family unrealistic in their expectations therefore would like patient to make decisions if deemed competent -7/30 meeting with patient/sister/brother-in-law explain plan of care for next week 1. attempted to cannulate if possible, 2. Cardiac catheterization 3. Reevaluate swallow.     DVT prophylaxis: Subcutaneous heparin Code Status: Full Family Communication: None Disposition Plan: Vent SNF   Consultants:  PCCM Dr. KLyman BishopCardiology  ID Nephrology  [facility]   Procedures/Significant Events:  7/21 TEE;- No evidence of  endocarditis.-Negative vegetation- Aortic insufficiency is seen, with a central jet. It   is likely due to degenerative changes.   7/24 transfuse 1U PRBC 7/27 PCXR: Cardiomegaly with bilateral from interstitial prominence and bilateral effusions C/W CHF, PNA cannot be excluded.- basilar atelectasis  7/27 transfused 1 unit PRBC    Cultures 7/14 MRSA by PCR negative 7/18 blood right hand x2 pending    Antimicrobials: Cipro 7/12>7/14 Vanc 7/2>7/17 Cefepime 7/14>7/21 Fluconazole 7/7>7/17 Flagyl 7/17 >   Devices 7/25 uncuffed #6 [place]>>   LINES / TUBES:  7/29 CorTrak tube placed    Continuous Infusions:         Objective: Vitals:   03/03/16 0732 03/03/16 1022 03/03/16 1148 03/03/16 1150  BP:  (!) 130/42    Pulse: 77 85    Resp: 11     Temp:      TempSrc:      SpO2: 98%  97% 97%  Weight:      Height:        Intake/Output Summary (Last 24 hours) at 03/03/16 1206 Last data filed at 03/03/16 0700  Gross per 24 hour  Intake           695.33 ml  Output             2669 ml  Net         -  1973.67 ml   Filed Weights   03/02/16 0344 03/02/16 1527 03/03/16 0358  Weight: 54 kg (119 lb 0.8 oz) 53.6 kg (118 lb 2.7 oz) 57.6 kg (127 lb)    Examination:  General: A/O 4 , follows commands, positive acute respiratory distress (improving) Eyes: negative scleral hemorrhage, negative anisocoria, negative icterus ENT: Negative Runny nose, negative gingival bleeding, Neck:  Negative scars, masses, torticollis, lymphadenopathy, JVD Lungs: diffuse rhonchi (improved ) Lt >>Rt, negative wheezes or crackles Cardiovascular:  Regular rhythm and rate without murmur gallop or rub normal S1 and S2 Abdomen: negative abdominal pain, nondistended, positive soft, bowel sounds, no rebound, no ascites, no appreciable mass Extremities: No significant cyanosis, clubbing, or edema bilateral lower extremities Skin: Negative rashes, lesions, ulcers Psychiatric:  Unable to evaluate  Central  nervous system:  Cranial nerves II through XII intact, follows commands moves extremities   .     Data Reviewed: Care during the described time interval was provided by me .  I have reviewed this patient's available data, including medical history, events of note, physical examination, and all test results as part of my evaluation. I have personally reviewed and interpreted all radiology studies.  CBC:  Recent Labs Lab 02/27/16 1130 02/28/16 1245 02/29/16 0307 03/01/16 0223 03/02/16 0351  WBC 9.2 11.0* 10.0 11.0* 13.2*  NEUTROABS  --   --  7.8* 9.1*  --   HGB 7.8* 7.9* 7.2* 10.3* 10.7*  HCT 24.5* 25.8* 23.6* 33.2* 34.5*  MCV 87.8 89.6 90.4 89.2 90.6  PLT 116* 138* 163 160 347   Basic Metabolic Panel:  Recent Labs Lab 02/26/16 1406 02/27/16 1130 02/28/16 1245 02/29/16 0307 03/01/16 0223 03/02/16 0351  NA 136 138 135  134* 134* 136 133*  K 4.3 4.8 4.7  4.7 4.9 3.5 4.0  CL 102 100* 98*  97* 93* 96* 94*  CO2 23 25 27  27 30 30 26   GLUCOSE 137* 119* 99  98 91 110* 93  BUN 70* 69* 50*  51* 59* 16 26*  CREATININE 4.37* 4.17* 3.23*  3.27* 3.69* 1.83* 2.97*  CALCIUM 7.5* 7.7* 7.7*  7.7* 7.9* 8.2* 8.4*  MG  --   --  1.9 2.0 1.9 2.0  PHOS 3.5 3.8 3.9  --   --   --    GFR: Estimated Creatinine Clearance: 26.7 mL/min (by C-G formula based on SCr of 2.97 mg/dL). Liver Function Tests:  Recent Labs Lab 02/26/16 1406 02/27/16 1130 02/28/16 1245  ALBUMIN 1.6* 1.6* 1.7*   No results for input(s): LIPASE, AMYLASE in the last 168 hours. No results for input(s): AMMONIA in the last 168 hours. Coagulation Profile: No results for input(s): INR, PROTIME in the last 168 hours. Cardiac Enzymes:  Recent Labs Lab 03/02/16 2100 03/03/16 0814  TROPONINI 0.07* 0.07*  0.07*   BNP (last 3 results) No results for input(s): PROBNP in the last 8760 hours. HbA1C: No results for input(s): HGBA1C in the last 72 hours. CBG:  Recent Labs Lab 03/02/16 1151 03/02/16 2108  03/02/16 2315 03/03/16 0354 03/03/16 0735  GLUCAP 137* 115* 130* 104* 90   Lipid Profile: No results for input(s): CHOL, HDL, LDLCALC, TRIG, CHOLHDL, LDLDIRECT in the last 72 hours. Thyroid Function Tests: No results for input(s): TSH, T4TOTAL, FREET4, T3FREE, THYROIDAB in the last 72 hours. Anemia Panel: No results for input(s): VITAMINB12, FOLATE, FERRITIN, TIBC, IRON, RETICCTPCT in the last 72 hours. Urine analysis:    Component Value Date/Time   COLORURINE RED (A) 02/04/2016 1426  APPEARANCEUR CLOUDY (A) 02/04/2016 1426   LABSPEC 1.025 02/04/2016 1426   PHURINE 7.5 02/04/2016 1426   GLUCOSEU 500 (A) 02/04/2016 1426   HGBUR SMALL (A) 02/04/2016 1426   BILIRUBINUR SMALL (A) 02/04/2016 1426   KETONESUR NEGATIVE 02/04/2016 1426   PROTEINUR >300 (A) 02/04/2016 1426   NITRITE NEGATIVE 02/04/2016 1426   LEUKOCYTESUR TRACE (A) 02/04/2016 1426   Sepsis Labs: @LABRCNTIP (procalcitonin:4,lacticidven:4)  ) No results found for this or any previous visit (from the past 240 hour(s)).       Radiology Studies: Dg Chest Port 1 View  Result Date: 03/02/2016 CLINICAL DATA:  Pneumonia, tracheostomy EXAM: PORTABLE CHEST 1 VIEW COMPARISON:  02/29/2016 FINDINGS: Tracheostomy tube remains in place, unchanged. Cardiomegaly is stable. Bilateral lower lobe airspace opacities and effusions again noted, stable. IMPRESSION: No significant change in bilateral lower lobe opacities and effusions. Electronically Signed   By: Rolm Baptise M.D.   On: 03/02/2016 09:08  Dg Abd Portable 1v  Result Date: 03/02/2016 CLINICAL DATA:  NG tube advanced EXAM: PORTABLE ABDOMEN - 1 VIEW COMPARISON:  03/02/2016 FINDINGS: Enteric tube has been advanced into the proximal to mid stomach. Nonobstructive bowel gas pattern. Prior cholecystectomy. IMPRESSION: Tube advanced into the proximal to mid stomach. Electronically Signed   By: Rolm Baptise M.D.   On: 03/02/2016 12:37  Dg Abd Portable 1v  Result Date:  03/02/2016 CLINICAL DATA:  Evaluate NG tube. EXAM: PORTABLE ABDOMEN - 1 VIEW COMPARISON:  None. FINDINGS: The distal tip of the feeding tube is near the GE junction. Recommend advancement. Bibasilar opacities remain. IMPRESSION: The feeding tube terminates in the distal esophagus. Recommend repositioning. These results will be called to the ordering clinician or representative by the Radiologist Assistant, and communication documented in the PACS or zVision Dashboard. Electronically Signed   By: Dorise Bullion III M.D   On: 03/02/2016 10:55       Scheduled Meds: . sodium chloride   Intravenous Once  . antiseptic oral rinse  7 mL Mouth Rinse QID  . aspirin  81 mg Per Tube Daily  . atorvastatin  80 mg Per Tube q1800  . chlorhexidine gluconate (SAGE KIT)  15 mL Mouth Rinse BID  . darbepoetin (ARANESP) injection - DIALYSIS  200 mcg Intravenous Q Thu-HD  . feeding supplement (NEPRO CARB STEADY)  1,000 mL Per Tube Q24H  . feeding supplement (PRO-STAT SUGAR FREE 64)  30 mL Per Tube Daily  . ferric gluconate (FERRLECIT/NULECIT) IV  125 mg Intravenous Q T,Th,Sa-HD  . gabapentin  300 mg Oral Q12H  . heparin subcutaneous  5,000 Units Subcutaneous Q8H  . insulin aspart  0-9 Units Subcutaneous Q4H  . ipratropium-albuterol  3 mL Nebulization QID  . isosorbide dinitrate  10 mg Per Tube TID  . metoprolol tartrate  12.5 mg Per Tube BID  . metroNIDAZOLE  500 mg Per Tube TID  . multivitamin  1 tablet Oral QHS  . pantoprazole sodium  40 mg Per Tube Daily   Continuous Infusions:     LOS: 16 days    Time spent: 40 minutes    WOODS, Geraldo Docker, MD [facility] Pager [phone]   If 7PM-7AM, please contact night-coverage [email or web address] Password [email or web address] 03/03/2016, 12:06 PM

## 2016-03-04 DIAGNOSIS — J441 Chronic obstructive pulmonary disease with (acute) exacerbation: Secondary | ICD-10-CM

## 2016-03-04 DIAGNOSIS — J9690 Respiratory failure, unspecified, unspecified whether with hypoxia or hypercapnia: Secondary | ICD-10-CM

## 2016-03-04 DIAGNOSIS — Z4659 Encounter for fitting and adjustment of other gastrointestinal appliance and device: Secondary | ICD-10-CM

## 2016-03-04 LAB — CBC WITH DIFFERENTIAL/PLATELET
BASOS ABS: 0 10*3/uL (ref 0.0–0.1)
Basophils Relative: 0 %
EOS PCT: 1 %
Eosinophils Absolute: 0.1 10*3/uL (ref 0.0–0.7)
HCT: 34.2 % — ABNORMAL LOW (ref 39.0–52.0)
Hemoglobin: 10.1 g/dL — ABNORMAL LOW (ref 13.0–17.0)
LYMPHS PCT: 12 %
Lymphs Abs: 1 10*3/uL (ref 0.7–4.0)
MCH: 27.5 pg (ref 26.0–34.0)
MCHC: 29.5 g/dL — ABNORMAL LOW (ref 30.0–36.0)
MCV: 93.2 fL (ref 78.0–100.0)
Monocytes Absolute: 1.5 10*3/uL — ABNORMAL HIGH (ref 0.1–1.0)
Monocytes Relative: 20 %
Neutro Abs: 5.1 10*3/uL (ref 1.7–7.7)
Neutrophils Relative %: 67 %
PLATELETS: 221 10*3/uL (ref 150–400)
RBC: 3.67 MIL/uL — AB (ref 4.22–5.81)
RDW: 19.7 % — ABNORMAL HIGH (ref 11.5–15.5)
WBC: 7.7 10*3/uL (ref 4.0–10.5)

## 2016-03-04 LAB — BLOOD CULTURE ID PANEL (REFLEXED)
ACINETOBACTER BAUMANNII: NOT DETECTED
CANDIDA GLABRATA: NOT DETECTED
CANDIDA TROPICALIS: NOT DETECTED
CARBAPENEM RESISTANCE: NOT DETECTED
Candida albicans: NOT DETECTED
Candida krusei: NOT DETECTED
Candida parapsilosis: NOT DETECTED
ENTEROCOCCUS SPECIES: NOT DETECTED
Enterobacter cloacae complex: NOT DETECTED
Enterobacteriaceae species: NOT DETECTED
Escherichia coli: NOT DETECTED
HAEMOPHILUS INFLUENZAE: NOT DETECTED
Klebsiella oxytoca: NOT DETECTED
Klebsiella pneumoniae: NOT DETECTED
LISTERIA MONOCYTOGENES: NOT DETECTED
METHICILLIN RESISTANCE: NOT DETECTED
Neisseria meningitidis: NOT DETECTED
PROTEUS SPECIES: NOT DETECTED
Pseudomonas aeruginosa: NOT DETECTED
SERRATIA MARCESCENS: NOT DETECTED
STAPHYLOCOCCUS AUREUS BCID: NOT DETECTED
STAPHYLOCOCCUS SPECIES: DETECTED — AB
STREPTOCOCCUS PYOGENES: NOT DETECTED
Streptococcus agalactiae: NOT DETECTED
Streptococcus pneumoniae: NOT DETECTED
Streptococcus species: NOT DETECTED
VANCOMYCIN RESISTANCE: NOT DETECTED

## 2016-03-04 LAB — GLUCOSE, CAPILLARY
GLUCOSE-CAPILLARY: 107 mg/dL — AB (ref 65–99)
GLUCOSE-CAPILLARY: 131 mg/dL — AB (ref 65–99)
GLUCOSE-CAPILLARY: 133 mg/dL — AB (ref 65–99)
GLUCOSE-CAPILLARY: 134 mg/dL — AB (ref 65–99)

## 2016-03-04 LAB — BASIC METABOLIC PANEL
ANION GAP: 10 (ref 5–15)
BUN: 29 mg/dL — ABNORMAL HIGH (ref 6–20)
CHLORIDE: 93 mmol/L — AB (ref 101–111)
CO2: 32 mmol/L (ref 22–32)
CREATININE: 3.11 mg/dL — AB (ref 0.61–1.24)
Calcium: 8.6 mg/dL — ABNORMAL LOW (ref 8.9–10.3)
GFR calc non Af Amer: 23 mL/min — ABNORMAL LOW (ref >60)
GFR, EST AFRICAN AMERICAN: 27 mL/min — AB (ref >60)
Glucose, Bld: 127 mg/dL — ABNORMAL HIGH (ref 65–99)
POTASSIUM: 4 mmol/L (ref 3.5–5.1)
SODIUM: 135 mmol/L (ref 135–145)

## 2016-03-04 LAB — TROPONIN I
TROPONIN I: 0.06 ng/mL — AB (ref <0.03)
Troponin I: 0.05 ng/mL (ref <0.03)

## 2016-03-04 LAB — MAGNESIUM: MAGNESIUM: 2.3 mg/dL (ref 1.7–2.4)

## 2016-03-04 MED ORDER — OXYCODONE HCL 5 MG PO TABS: 5.0000 mg | ORAL_TABLET | ORAL | Status: DC | PRN

## 2016-03-04 MED ORDER — MORPHINE SULFATE (PF) 2 MG/ML IV SOLN
2.0000 mg | Freq: Once | INTRAVENOUS | Status: AC
  Administered 2016-03-04: 2 mg via INTRAVENOUS
  Filled 2016-03-04: qty 1

## 2016-03-04 NOTE — Progress Notes (Signed)
[facility] Campbell - Stepdown/ICU TEAM  Tanner Campbell  ZYS:[id number] DOB: June 13, 1976 DOA: 02/17/2016 PCP: No primary care provider on file.    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  He decompensated 6/23 and required intubation. The patient was found to have H. Influenza positive sputum and treated with Rocephin. CXR demonstrated a large right pleural effusion and he underwent a thoracentesis (1L serous fluid removed - exudative by protein). He had difficulty with weaning and was transferred to [facility] on 6/29 orally intubated for ventilator weaning. The patient developed fever and antibiotics were expanded to vancomycin, cefepime and diflucan. He had progressed to weaning on PSV x 4 hours as of 7/3. He self extubated on 7/5. Since that time he required BiPAP off and on. After HD 7/10 his mental status worsened and he became minimally responsive. CXR was consistent with RLL opacification and he was re-intubated. He was re-cultured and also found to have increased Troponin for which Cardiology was consulted. He was placed on LMWH, but had a GIB while on this w/ his hgb dropping as low as 8.4.  On 7/14 he remained ventilator dependent. His sputum cultures grew MSSA and E coli and he was noted to have + blood cultures. Because of these acutely worsening issues it was felt best to transfer him to the ICU at [facility].   Significant Events: 6/21 admitted at [facility] 6/29 transfer to [facility] 7/14 transfer from [facility] to [facility] 7/18 trach placed  Subjective: Resting comfortably with no evidence of distress.  Assessment & Plan:  Acute hypoxic respiratory failure - s/p trach 7/18 - HCAP Ventilator management and trach care per PCCM - has completed a course of cefepime - currently tolerating trach collar without difficulty - refusing to use  vibravest ordered by Dr. Sherral Hammers  Gram + cocci in blood cx Blood cx obtained 7/30 Campbell of 2 currently + for gram+ cocci - f/u speciation/sensitivity - hold on new abx for now as pt does not appear toxic - afeb w/ normal WBC   Bacteremia with Veillonella species (Gram negative rod) continue Flagyl x 2 weeks for bacteremia as per ID recs   Bilateral pleural effusions s/p thoracentesis Stable on follow-up chest x-ray 7/21 - no respiratory distress w/ stable oxygenation  NSTEMI vs demand ischemia Cardiology to perform cardiac cath this week   Small pericardial effusion with friction rub Cardiology following - ANA negative - TEE without evidence of endocarditis/vegetations - hemodynamically stable  Mild to moderate aortic regurgitation   ESRD on HD Tue/Th/Sat Nephrology following   Recent GIB on LMWH No evidence of acute blood loss a this time   Anemia of chronic kidney disease + acute blood loss  No evidence of blood loss, but Hgb drifted down to 7.Campbell - s/p 1U PRBC 7/24 - Hgb has improved as expected - follow   Recent Labs Lab 02/29/16 0307 03/01/16 0223 03/02/16 0351 03/03/16 1306 03/04/16 0343  HGB 7.2* 10.3* 10.7* 10.0* 10.Campbell*    DM type II w/ hyperglycemia  CBG stable at this time   Acute Encephalopathy in setting of critical illness Resolved    Severe malnutrition in context of acute illness Continue supportive tube feeding via CorTrak  Dysphagia SLP reevaluation on 8/Campbell  Disposition Planning  PT recommended CIR - CIR felt he  is more appropriate for LTACH - sister does not want him to go back to [facility] because she didn't feel he was being treated well - plan is for SNF placement, but will have to be decannulated as he is ESRD and requires HD and there are no area SNFs that accept ESRD AND trach patients    DVT prophylaxis: SQ heparin  Code Status: FULL CODE Family Communication: no family present at time of exam  Disposition Plan: SDU   Consultants:   PCCM Cardiology  ID Nephrology  ENT  Antimicrobials:  Cipro 7/12>7/14 Vanc 7/2>7/17 Cefepime 7/14>7/21 Fluconazole 7/7>7/17 Flagyl 7/17 >  Objective: Blood pressure 111/68, pulse 98, temperature 97.6 F (36.4 C), temperature source Oral, resp. rate 16, height 5' 5"  (Campbell.651 m), weight 58.5 kg (129 lb), SpO2 95 %.  Intake/Output Summary (Last 24 hours) at 03/04/16 0912 Last data filed at 03/03/16 1900  Gross per 24 hour  Intake              650 ml  Output                0 ml  Net              650 ml   Filed Weights   03/02/16 1527 03/03/16 0358 03/04/16 0401  Weight: 53.6 kg (118 lb 2.7 oz) 57.6 kg (127 lb) 58.5 kg (129 lb)    Examination: General: No acute respiratory distress  Lungs: no wheeze or focal crackles  Cardiovascular: Regular rate and rhythm  Abdomen: Nondistended, soft, bowel sounds positive Extremities: No significant cyanosis, clubbing, edema B LE   CBC:  Recent Labs Lab 02/29/16 0307 03/01/16 0223 03/02/16 0351 03/03/16 1306 03/04/16 0343  WBC 10.0 11.0* 13.2* 7.0 7.7  NEUTROABS 7.8* 9.Campbell*  --  5.6 5.Campbell  HGB 7.2* 10.3* 10.7* 10.0* 10.Campbell*  HCT 23.6* 33.2* 34.5* 32.7* 34.2*  MCV 90.4 89.2 90.6 92.9 93.2  PLT 163 160 177 213 263   Basic Metabolic Panel:  Recent Labs Lab 02/26/16 1406 02/27/16 1130  02/28/16 1245 02/29/16 0307 03/01/16 0223 03/02/16 0351 03/03/16 1306 03/04/16 0343  NA 136 138  --  135  134* 134* 136 133* 134* 135  K 4.3 4.8  --  4.7  4.7 4.9 3.5 4.0 3.3* 4.0  CL 102 100*  --  98*  97* 93* 96* 94* 91* 93*  CO2 23 25  --  27  27 30 30 26 31  32  GLUCOSE 137* 119*  --  99  98 91 110* 93 147* 127*  BUN 70* 69*  --  50*  51* 59* 16 26* 20 29*  CREATININE 4.37* 4.17*  --  3.23*  3.27* 3.69* Campbell.83* 2.97* 2.62* 3.11*  CALCIUM 7.5* 7.7*  --  7.7*  7.7* 7.9* 8.2* 8.4* 8.3* 8.6*  MG  --   --   < > Campbell.9 2.0 Campbell.9 2.0 2.Campbell 2.3  PHOS 3.5 3.8  --  3.9  --   --   --   --   --   < > = values in this interval not displayed.    GFR: Estimated Creatinine Clearance: 25.9 mL/min (by C-G formula based on SCr of 3.11 mg/dL).  Liver Function Tests:  Recent Labs Lab 02/26/16 1406 02/27/16 1130 02/28/16 1245  ALBUMIN Campbell.6* Campbell.6* Campbell.7*    Cardiac Enzymes:  Recent Labs Lab 03/02/16 2100 03/03/16 0814  TROPONINI 0.07* 0.07*  0.07*    HbA1C: Hgb A1c MFr Bld  Date/Time Value  Ref Range Status  02/02/2016 06:50 AM 7.7 (H) 4.8 - 5.6 % Final    Comment:    (NOTE)         Pre-diabetes: 5.7 - 6.4         Diabetes: >6.4         Glycemic control for adults with diabetes: <7.0   01/25/2016 04:32 AM 8.2 (H) 4.0 - 6.0 % Final    CBG:  Recent Labs Lab 03/03/16 1644 03/03/16 1939 03/04/16 0029 03/04/16 0405 03/04/16 0736  GLUCAP 104* 122* 133* 131* 107*    Recent Results (from the past 240 hour(s))  Culture, blood (routine x 2)     Status: None (Preliminary result)   Collection Time: 03/03/16  Campbell:16 PM  Result Value Ref Range Status   Specimen Description BLOOD BLOOD RIGHT ARM  Final   Special Requests IN PEDIATRIC BOTTLE 3CC  Final   Culture  Setup Time   Final    GRAM POSITIVE COCCI IN CLUSTERS AEROBIC BOTTLE ONLY Organism ID to follow    Culture GRAM POSITIVE COCCI  Final   Report Status PENDING  Incomplete  Culture, respiratory (NON-Expectorated)     Status: None (Preliminary result)   Collection Time: 03/03/16  Campbell:19 PM  Result Value Ref Range Status   Specimen Description TRACHEAL ASPIRATE  Final   Special Requests NONE  Final   Gram Stain   Final    ABUNDANT WBC PRESENT, PREDOMINANTLY PMN RARE SQUAMOUS EPITHELIAL CELLS PRESENT FEW GRAM NEGATIVE COCCI IN PAIRS RARE GRAM NEGATIVE RODS RARE GRAM POSITIVE COCCI IN PAIRS    Culture PENDING  Incomplete   Report Status PENDING  Incomplete     Scheduled Meds: . sodium chloride   Intravenous Once  . antiseptic oral rinse  7 mL Mouth Rinse QID  . aspirin  81 mg Per Tube Daily  . atorvastatin  80 mg Per Tube q1800  . chlorhexidine  gluconate (SAGE KIT)  15 mL Mouth Rinse BID  . darbepoetin (ARANESP) injection - DIALYSIS  200 mcg Intravenous Q Thu-HD  . feeding supplement (NEPRO CARB STEADY)  Campbell,000 mL Per Tube Q24H  . feeding supplement (PRO-STAT SUGAR FREE 64)  30 mL Per Tube Daily  . ferric gluconate (FERRLECIT/NULECIT) IV  125 mg Intravenous Q T,Th,Sa-HD  . furosemide  80 mg Intravenous Q12H  . gabapentin  300 mg Oral Q12H  . heparin subcutaneous  5,000 Units Subcutaneous Q8H  . insulin aspart  0-9 Units Subcutaneous Q4H  . ipratropium-albuterol  3 mL Nebulization QID  . isosorbide dinitrate  10 mg Per Tube TID  . metoprolol tartrate  12.5 mg Per Tube BID  . metroNIDAZOLE  500 mg Per Tube TID  . multivitamin  Campbell tablet Oral QHS  . pantoprazole sodium  40 mg Per Tube Daily     LOS: 17 days    Cherene Altes, MD [facility] Office  [phone] Pager - Text Page per Amion as per below:  On-Call/Text Page:      [email or web address]      password [email or web address]  If 7PM-7AM, please contact night-coverage [email or web address] Password [email or web address] 03/04/2016, 9:12 AM

## 2016-03-04 NOTE — Progress Notes (Signed)
Rn was called to pt room. Pt requesting a new gown.  At this time it was noted that the patient had removed his cortrak feeding tube. All vital signs are stable at this time. Donnamarie Poag NP notified.  Will continue to monitor.

## 2016-03-04 NOTE — Progress Notes (Signed)
Physical Therapy Treatment Patient Details Name: Tanner Campbell MRN: [id number] DOB: [birth year] Today's Date: 03/04/2016    History of Present Illness Tanner Campbell is an 40 y.o. 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 He was intubated 6/23-7/5 (self extubated), 7/10-7/16 (self extubated), was then reintubated. Initially, sputum was positive for H influenza and subsequently for MSSA and Escherichia coli and blood cultures growing a gram-negative anaerobic organism. Thoracentesis on right showed borderline exudate by LDH criteria. [name] 7/18; NG tube feedings    PT Comments    Noted patient denied inpatient rehab stay. Patient alert, processing much more quickly, and tolerated incr activity much better today. (SaO2>95% on 28% trach collar with PMV; HR 80-90) Very motivated and expressed his frustration re: slow progress and wanting to work with therapy more.    Follow Up Recommendations  Supervision/Assistance - 24 hour;SNF     Equipment Recommendations  Other (comment) (TBA)    Recommendations for Other Services       Precautions / Restrictions Precautions Precautions: Fall Precaution Comments: Trach Restrictions Weight Bearing Restrictions: No    Mobility  Bed Mobility Overal bed mobility: Needs Assistance Bed Mobility: Rolling;Sidelying to Sit;Sit to Sidelying Rolling: Supervision Sidelying to sit: Mod assist;HOB elevated     Sit to sidelying: Mod assist General bed mobility comments: uses rail; HOB elevated due to NG tube feeds; assist to raise torso due to weakness  Transfers Overall transfer level: Needs assistance Equipment used: None Transfers: Sit to/from Stand Sit to Stand: Max assist         General transfer comment: attempt to stand from bed at normal height with only clearing hips ~2 inches off  bed; elevated bed ~4" and stood x 2 with max assist for anterior and upward weight shift  Ambulation/Gait             General Gait Details: unable to attempt at this time   Stairs            Wheelchair Mobility    Modified Rankin (Stroke Patients Only)       Balance   Sitting-balance support: Single extremity supported;Feet supported Sitting balance-Leahy Scale: Poor Sitting balance - Comments: RUE on bedrail and able to maintain balance x 15 minutes EOB with minguard   Standing balance support: No upper extremity supported Standing balance-Leahy Scale: Zero                      Cognition Arousal/Alertness: Awake/alert Behavior During Therapy: WFL for tasks assessed/performed Overall Cognitive Status: No family/caregiver present to determine baseline cognitive functioning Area of Impairment: Attention;Following commands;Problem solving;Awareness Orientation Level:  (thinks he is in [place] and that it is [place]) Current Attention Level: [place]   Following Commands: Follows one step commands with increased time   Awareness: Intellectual Problem Solving: Slow processing;Decreased initiation;Difficulty sequencing;Requires verbal cues;Requires tactile cues General Comments: following instructions more briskly overall     Exercises General Exercises - Lower Extremity Long Arc Quad: Strengthening;Both;10 reps;Sidelying;Limitations Long [employer] Limitations: sidelying due to painful sacrum; resisted knee extension and flexion x10 each leg; legs remain very weak Other Exercises Other Exercises: bridging x 5 with assist to stabilze his feet    General Comments General comments (skin integrity, edema, etc.): Very motivated and twice voiced his disappointment that nursing will not help  him sit EOB so he can work his legs      Pertinent Vitals/Pain Pain Assessment: Faces Faces Pain Scale: Hurts even more Pain Location: sacral wound Pain Descriptors /  Indicators: Discomfort Pain Intervention(s): Limited activity within patient's tolerance;Monitored during session;Repositioned    Home Living                      Prior Function            PT Goals (current goals can now be found in the care plan section) Acute Rehab PT Goals Patient Stated Goal: to get stronger PT Goal Formulation: With patient Time For Goal Achievement: 03/19/16 Potential to Achieve Goals: Good Progress towards PT goals: Goals met and updated - see care plan    Frequency  Min 3X/week    PT Plan Discharge plan needs to be updated (denied inpatient rehab)    Co-evaluation             End of Session Equipment Utilized During Treatment: Oxygen;Gait belt (trach) Activity Tolerance: Patient tolerated treatment well Patient left: with call bell/phone within reach;with SCD's reapplied;in bed;with nursing/sitter in room     Time: 1133-1208 PT Time Calculation (min) (ACUTE ONLY): 35 min  Charges:  $Therapeutic Exercise: 8-22 mins $Therapeutic Activity: 8-22 mins                    G Codes:      Tanner Campbell Mar 31, 2016, 12:41 PM Pager [phone]

## 2016-03-04 NOTE — Progress Notes (Signed)
Speech Language Pathology Treatment: Dysphagia  Patient Details Name: Rabon Hagee MRN: [id number] DOB: Apr 07, 1976 Today's Date: 03/04/2016 Time: 5573-2202 SLP Time Calculation (min) (ACUTE ONLY): 23 min  Assessment / Plan / Recommendation Clinical Impression  Pt seen for dysphagia followup with PMSV in place throughout session. Pt more alert and able to participate. Pt reports feeling hungry and requests something to eat. Reviewed results of recent FEES and need to reassess with an objective measure prior to initiation of PO intake due to silent aspiration. Pt trialed with min trials of ice chips/water via teaspoon without s/s aspiration. Pt was able to improve timeliness of swallow response with verbal cueing. Recommend: Repeat FEES to reassess swallow function and readiness to initiate PO intake. Pt verbalized agreement.   HPI HPI: 40 year old male admitted 02/17/2016 due to recurrent sepsis, failure to wean, multiple intubations. PMH significant forDM2, HTN, HLD, ESRD on HD, COPD. Trach placed 03/02/16.       SLP Plan  Continue with current plan of care (repeat objective measure of swallow function)     Recommendations  Diet recommendations: NPO      Patient may use Passy-Muir Speech Valve: During all waking hours (remove during sleep) PMSV Supervision: Intermittent      Plan: Continue with current plan of care (repeat objective measure of swallow function)     GO                [name] Crafts MA, CCC-SLP 03/04/2016, 10:08 AM

## 2016-03-04 NOTE — Progress Notes (Signed)
Pt refuses CPT 

## 2016-03-04 NOTE — Care Management Important Message (Signed)
Important Message  Patient Details  Name: Tanner Campbell MRN: [id number] Date of Birth: [birth year]   Medicare Important Message Given:  Yes    Bernadette Hoit 03/04/2016, 3:08 PM

## 2016-03-04 NOTE — Progress Notes (Signed)
PULMONARY / CRITICAL CARE MEDICINE   Name: Tanner Campbell MRN: [id number] DOB: May 08, 1976    ADMISSION DATE:  2016-02-17  REFERRING MD:  Sharyon Medicus -->McClung   CHIEF COMPLAINT:  Prolonged critical illness, failure to wean, recurrent sepsis. PCCM following for trach care.   HPI 40 year old male w/ ESRD whom we initially admitted to [facility] from [facility] where he was admitted for ongoing care for PNA and AECOPD. His course was complicated by: HCAP, AECOPD, bacteremia (veillonella species), NSTEMI, pericardial friction rub, GIB, and acute encephalopathy and now severe physical deconditioning and protein calorie malnutrition. Was transferred to [facility] per family request & on-going care. Eventually underwent trach on 7/18 was liberated from the vent and transferred to the IM service. He has been making slow progress. His trach now changed to 6 cuffless and ENT has singed off. PCCM was asked to assist w/ vent trach management and timing of decannulation.    SUBJECTIVE:  Continues to do well with ATC and PMV.  Seen by speech today who recommends repeat FEES.  VITAL SIGNS: BP 111/68   Pulse 79   Temp 97.6 F (36.4 C) (Oral)   Resp 14   Ht 5\' 5"  (1.651 m)   Wt 129 lb (58.5 kg)   SpO2 100%   BMI 21.47 kg/m   VENTILATOR SETTINGS: FiO2 (%):  [28 %] 28 %  INTAKE / OUTPUT: I/O last 3 completed shifts: In: 1130 [NG/GT:1130] Out: 2669 [Urine:50; Other:2619]  PHYSICAL EXAMINATION: General: Awake, alert, in NAD Neuro: Follows commands, oriented, remains weak, mouths words HEENT: Trach in place, 6 cuffless. PMV voice quality is poor Cardiac: regular, no murmur Chest: normal work of breathing, scattered rhonchi bilaterally  Abd: soft, non tender Ext: no edema, decreased muscle bulk Skin: multiple areas of healing excoriations on the lower extremities  LABS:  BMET  Recent Labs Lab 03/02/16 0351 03/03/16 1306 03/04/16 0343  NA 133* 134* 135  K 4.0 3.3* 4.0  CL 94* 91* 93*  CO2 26 31 32  BUN  26* 20 29*  CREATININE 2.97* 2.62* 3.11*  GLUCOSE 93 147* 127*   CBC  Recent Labs Lab 03/02/16 0351 03/03/16 1306 03/04/16 0343  WBC 13.2* 7.0 7.7  HGB 10.7* 10.0* 10.1*  HCT 34.5* 32.7* 34.2*  PLT 177 213 221    STUDIES:  7/13 Echo >> EF 40 to 45%, mod LVH, small pericardial effusion 7/17 CXR >> stable bilateral pleural effusions, stable patchy opacities in the lung bases- pneumonia vs atelectasis ANA neg  CULTURES: BCX2 7/10: Veillonella species (Gram negative coccobacilli) resp culture 7/10: Moderate SA and E.coli (both pan sensitive)  ANTIBIOTICS: cipro 7/12>>>7/14 vanc 7/2>>>7/17 Cefepime 7/14>>>7/21 Fluconazole 7/7>>>7/17 Flagyl 7/17 >>  SIGNIFICANT EVENTS: 7/14 Transfer from [facility] to [facility] 7/18 Trach placed 7/22 off vent to SDU. 7/25 PCCM asked to assist w/ trach care    LINES/TUBES: OETT 7/10>>>7/17; 7/17 >>> Right IJ PICC (? 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    Plan Continue to work w/ SLP for improved voice quality and swallowing Continue aggressive PT  Continue routine trach care  When he is stronger & ambulating, we can initiate trach capping trials to  assess for decannulation   [facility] other SNF that accepts trach-HD (pt's sister does not want him to return to [facility])   PCCM will follow intermittently for trach / vent needs.   RahuRutherford Guys [place] [facility] Pager: [phone]  or [phone] 03/04/2016, 11:49 AM  Attending Note:  40 year old male with critical illness due to HCAP and AE of COPD now trached.  On exam, he is cachectic with trach in place and coarse BS diffusely.  I reviewed CXR myself, trach is in good position.  Patient is interactive.  Discussed with PCCM-NP and bedside RN.  Acute on chronic respiratory failure:  - Continue weaning efforts.  Tracheostomy status:  - Maintain current trach size and type.  - May proceed with PMV if able.  Hypoxemia:  - Titrate O2 for sat of 88-92%.  - May need ambulatory desat study when able to ambulate for home O2.  COPD:  - Bronchodilators as ordered.  - PRN albuterol.  Patient seen and examined, agree with above note.  I dictated the care and orders written for this patient under my direction.  Alyson Reedy, MD [phone]

## 2016-03-04 NOTE — Progress Notes (Signed)
OT Cancellation Note  Patient Details Name: Tanner Campbell MRN: [id number] DOB: 1975/12/06   Cancelled Treatment:    Reason Eval/Treat Not Completed: Medical issues which prohibited therapy.  Pt with c/o chest pain per RN, and undergoing work up.  Will try back.  [name], OTR/L [phone]   [name] 03/04/2016, 3:25 PM

## 2016-03-04 NOTE — Progress Notes (Signed)
PHARMACY - PHYSICIAN COMMUNICATION CRITICAL VALUE ALERT - BLOOD CULTURE IDENTIFICATION (BCID)  Results for orders placed or performed during the hospital encounter of 02/27/2016  Blood Culture ID Panel (Reflexed) (Collected: 03/03/2016  1:16 PM)  Result Value Ref Range   Enterococcus species NOT DETECTED NOT DETECTED   Vancomycin resistance NOT DETECTED NOT DETECTED   Listeria monocytogenes NOT DETECTED NOT DETECTED   Staphylococcus species DETECTED (A) NOT DETECTED   Staphylococcus aureus NOT DETECTED NOT DETECTED   Methicillin resistance NOT DETECTED NOT DETECTED   Streptococcus species NOT DETECTED NOT DETECTED   Streptococcus agalactiae NOT DETECTED NOT DETECTED   Streptococcus pneumoniae NOT DETECTED NOT DETECTED   Streptococcus pyogenes NOT DETECTED NOT DETECTED   Acinetobacter baumannii NOT DETECTED NOT DETECTED   Enterobacteriaceae species NOT DETECTED NOT DETECTED   Enterobacter cloacae complex NOT DETECTED NOT DETECTED   Escherichia coli NOT DETECTED NOT DETECTED   Klebsiella oxytoca NOT DETECTED NOT DETECTED   Klebsiella pneumoniae NOT DETECTED NOT DETECTED   Proteus species NOT DETECTED NOT DETECTED   Serratia marcescens NOT DETECTED NOT DETECTED   Carbapenem resistance NOT DETECTED NOT DETECTED   Haemophilus influenzae NOT DETECTED NOT DETECTED   Neisseria meningitidis NOT DETECTED NOT DETECTED   Pseudomonas aeruginosa NOT DETECTED NOT DETECTED   Candida albicans NOT DETECTED NOT DETECTED   Candida glabrata NOT DETECTED NOT DETECTED   Candida krusei NOT DETECTED NOT DETECTED   Candida parapsilosis NOT DETECTED NOT DETECTED   Candida tropicalis NOT DETECTED NOT DETECTED    Name of physician (or Provider) Contacted: Dr. Sharon Seller aware of blood culture result  Changes to prescribed antibiotics required: None. Appropriately holding antibiotics in this case.  Sallee Provencal 03/04/2016  12:26 PM

## 2016-03-04 NOTE — Progress Notes (Signed)
RT NOTE:  PT refuses CPT  

## 2016-03-04 NOTE — Progress Notes (Signed)
[facility] Rounding Note Subjective/Interval History:   No new events. For HD tomorrow  Objective: Vital signs in last 24 hours: Temp:  [97.6 F (36.4 C)-97.8 F (36.6 C)] 97.8 F (36.6 C) (07/31 1219) Pulse Rate:  [68-98] 79 (07/31 1117) Resp:  [9-20] 14 (07/31 1117) BP: (84-136)/(42-75) 136/74 (07/31 1219) SpO2:  [91 %-100 %] 100 % (07/31 1117) FiO2 (%):  [28 %] 28 % (07/31 1117) Weight:  [58.5 kg (129 lb)] 58.5 kg (129 lb) (07/31 0401) Weight change: 4.914 kg (10 lb 13.3 oz)   Physical Examination BP 136/74 (BP Location: Right Arm)   Pulse 79   Temp 97.8 F (36.6 C) (Oral)   Resp 14   Ht _0  (1.651 m)   Wt 58.5 kg (129 lb)   SpO2 100%   BMI 21.47 kg/m   General: No acute respiratory distress  Lungs: No crackles or wheezes Regular rhythm S1S2 normal Nondistended, soft, bowel sounds positive No significant LE edema  Left upper arm AVF with + bruit and thrill, bruising from prior infiltration  Lab Results:  Recent Labs  03/03/16 1306 03/04/16 0343  WBC 7.0 7.7  HGB 10.0* 10.1*  HCT 32.7* 34.2*  PLT 213 221    Recent Labs  03/03/16 1306 03/04/16 0343  NA 134* 135  K 3.3* 4.0  CL 91* 93*  CO2 31 32  GLUCOSE 147* 127*  BUN 20 29*  CREATININE 2.62* 3.11*  CALCIUM 8.3* 8.6*    Studies/Results: Dg Abd Portable 1v  Result Date: 03/03/2016 CLINICAL DATA:  Encounter for feeding tube placement. History of diabetes, end-stage renal disease on dialysis, hypertension. EXAM: PORTABLE ABDOMEN - 1 VIEW COMPARISON:  Abdominal radiograph March 02, 2016 FINDINGS: Feeding tube tip projects in mid stomach, advanced from prior imaging. Bowel gas pattern is nondilated. Scattered linear calcifications are unchanged, likely vascular. No intra-abdominal mass effect. Similar lung base pleural effusions Ing chronic appearing interstitial changes. IMPRESSION: Feeding tube tip projects in mid stomach. Electronically Signed   By: Elon Alas M.D.   On: 03/03/2016  22:18  Current Medications . antiseptic oral rinse  7 mL Mouth Rinse QID  . aspirin  81 mg Per Tube Daily  . atorvastatin  80 mg Per Tube q1800  . chlorhexidine gluconate (SAGE KIT)  15 mL Mouth Rinse BID  . darbepoetin (ARANESP) injection - DIALYSIS  200 mcg Intravenous Q Thu-HD  . feeding supplement (NEPRO CARB STEADY)  1,000 mL Per Tube Q24H  . feeding supplement (PRO-STAT SUGAR FREE 64)  30 mL Per Tube Daily  . ferric gluconate (FERRLECIT/NULECIT) IV  125 mg Intravenous Q T,Th,Sa-HD  . gabapentin  300 mg Oral Q12H  . heparin subcutaneous  5,000 Units Subcutaneous Q8H  . insulin aspart  0-9 Units Subcutaneous Q4H  . ipratropium-albuterol  3 mL Nebulization QID  . isosorbide dinitrate  10 mg Per Tube TID  . metoprolol tartrate  12.5 mg Per Tube BID  . metroNIDAZOLE  500 mg Per Tube TID  . multivitamin  1 tablet Oral QHS  . pantoprazole sodium  40 mg Per Tube Daily    Background: 40 yo with DM, HTN, HLD, MS, COPD. ESRD on HD since 04/2014 via AVF. Acute resp failure 01/2015 [facility] with AMS, recurrent acute hypoxic resp failure, + trops - NSTEMI vs demand ischemia, GIB, bacteremia (Veillonella; new GPC 7/30 1/2). We were asked to provide his TTS HD  Assessment/Plan:  1. ESRD  TTS [place] (more recently in [facility]). Next HD tomorrow.  2. Pericarditis -TEE neg. Small effusion only. Rub gone.  3. Acute resp failure - per CCM 4. Bacteremias (veillonella, GCP clusters 7/30) - s/p cefipime, on flagyl. No new Rx for GPC (per Dr. Thereasa Solo) 5. NSTEMI vs demand ischemia - anticipate heart cath later this week 6. AI - mild to mod 7. Nutrition TF's 8. Debility 9. Anemia - CKD + ? BL. S/p PRBC's X 1 on 7/24. Getting darbe 200 + ferrlecit 10. Disposition - will need LTAC. No HD unit in [place] can take with trach even if SNF takes.  Jamal Maes, MD [place] Pager 03/04/2016, 1:28 PM

## 2016-03-05 LAB — CBC
HCT: 34.9 % — ABNORMAL LOW (ref 39.0–52.0)
HEMOGLOBIN: 10.2 g/dL — AB (ref 13.0–17.0)
MCH: 27.5 pg (ref 26.0–34.0)
MCHC: 29.2 g/dL — ABNORMAL LOW (ref 30.0–36.0)
MCV: 94.1 fL (ref 78.0–100.0)
Platelets: 217 10*3/uL (ref 150–400)
RBC: 3.71 MIL/uL — AB (ref 4.22–5.81)
RDW: 19 % — ABNORMAL HIGH (ref 11.5–15.5)
WBC: 6.2 10*3/uL (ref 4.0–10.5)

## 2016-03-05 LAB — RENAL FUNCTION PANEL
ALBUMIN: 1.9 g/dL — AB (ref 3.5–5.0)
ANION GAP: 12 (ref 5–15)
BUN: 47 mg/dL — ABNORMAL HIGH (ref 6–20)
CALCIUM: 8.5 mg/dL — AB (ref 8.9–10.3)
CO2: 29 mmol/L (ref 22–32)
Chloride: 91 mmol/L — ABNORMAL LOW (ref 101–111)
Creatinine, Ser: 3.98 mg/dL — ABNORMAL HIGH (ref 0.61–1.24)
GFR calc non Af Amer: 17 mL/min — ABNORMAL LOW (ref >60)
GFR, EST AFRICAN AMERICAN: 20 mL/min — AB (ref >60)
Glucose, Bld: 72 mg/dL (ref 65–99)
PHOSPHORUS: 4.6 mg/dL (ref 2.5–4.6)
POTASSIUM: 4.1 mmol/L (ref 3.5–5.1)
SODIUM: 132 mmol/L — AB (ref 135–145)

## 2016-03-05 LAB — GLUCOSE, CAPILLARY
GLUCOSE-CAPILLARY: 113 mg/dL — AB (ref 65–99)
GLUCOSE-CAPILLARY: 82 mg/dL (ref 65–99)
GLUCOSE-CAPILLARY: 87 mg/dL (ref 65–99)
Glucose-Capillary: 138 mg/dL — ABNORMAL HIGH (ref 65–99)
Glucose-Capillary: 95 mg/dL (ref 65–99)

## 2016-03-05 LAB — CULTURE, RESPIRATORY

## 2016-03-05 LAB — TROPONIN I: Troponin I: 0.07 ng/mL (ref <0.03)

## 2016-03-05 MED ORDER — MORPHINE SULFATE (PF) 2 MG/ML IV SOLN
INTRAVENOUS | Status: AC
  Filled 2016-03-05: qty 1

## 2016-03-05 MED ORDER — IPRATROPIUM-ALBUTEROL 0.5-2.5 (3) MG/3ML IN SOLN
RESPIRATORY_TRACT | Status: AC
  Administered 2016-03-05: 3 mL via RESPIRATORY_TRACT
  Filled 2016-03-05: qty 3

## 2016-03-05 MED ORDER — ALBUMIN HUMAN 25 % IV SOLN: 25.0000 g | Freq: Once | INTRAVENOUS | Status: AC

## 2016-03-05 MED ORDER — ALBUMIN HUMAN 25 % IV SOLN
INTRAVENOUS | Status: AC
  Administered 2016-03-05: 25 g
  Filled 2016-03-05: qty 100

## 2016-03-05 NOTE — Progress Notes (Signed)
RT came to do trach check. Pt is absent from unit at this time.

## 2016-03-05 NOTE — Progress Notes (Signed)
OT Cancellation Note  Patient Details Name: Tanner Campbell MRN: [id number] DOB: 1975-12-30   Cancelled Treatment:    Reason Eval/Treat Not Completed: Patient at procedure or test/ unavailable.  Pt in HD.  Tanner Campbell, OTR/L [phone]   [name] 03/05/2016, 10:38 AM

## 2016-03-05 NOTE — Progress Notes (Signed)
RT NOTE:  PT refused CPT for 2000.

## 2016-03-05 NOTE — Progress Notes (Signed)
[facility] Rounding Note Subjective/Interval History:   Seen in dialysis Intermittently c/o CP (last night rec'd morphine, sl NITRO  BP low 80's Pulled out his feeding tube yesterday  Objective: Vital signs in last 24 hours: Temp:  [97.4 F (36.3 C)-97.8 F (36.6 C)] 97.4 F (36.3 C) (08/01 0404) Pulse Rate:  [72-82] 78 (08/01 0800) Resp:  [10-22] 17 (08/01 0800) BP: (85-145)/(45-82) 95/51 (08/01 0404) SpO2:  [95 %-100 %] 100 % (08/01 0800) FiO2 (%):  [28 %] 28 % (08/01 0800) Weight:  [56 kg (123 lb 7.3 oz)-57.6 kg (127 lb)] 56 kg (123 lb 7.3 oz) (08/01 0800) Weight change: -0.907 kg (-2 lb)   Physical Examination BP (!) 95/51 (BP Location: Right Arm)   Pulse 78   Temp 97.4 F (36.3 C) (Oral)   Resp 17   Ht 5' 5"  (1.651 m)   Wt 56 kg (123 lb 7.3 oz)   SpO2 100%   BMI 20.54 kg/m   General: No acute respiratory distress  Trach collar Lungs posteriorly clear Cardiac rhythm regular S1S2 No s3 Abd soft + BS In SCD's. Minimal if any edema  Left upper arm AVF with + bruit and thrill, bruising from prior infiltration - currently cannulated and access arm restrained (moving arm during HD)  Lab Results:  Recent Labs  03/04/16 0343 03/05/16 0334  WBC 7.7 6.2  HGB 10.1* 10.2*  HCT 34.2* 34.9*  PLT 221 217    Recent Labs  03/04/16 0343 03/05/16 0644  NA 135 132*  K 4.0 4.1  CL 93* 91*  CO2 32 29  GLUCOSE 127* 72  BUN 29* 47*  CREATININE 3.11* 3.98*  CALCIUM 8.6* 8.5*  Phosphorus                         4.6  Studies/Results: Dg Abd Portable 1v  Result Date: 03/03/2016 CLINICAL DATA:  Encounter for feeding tube placement. History of diabetes, end-stage renal disease on dialysis, hypertension. EXAM: PORTABLE ABDOMEN - 1 VIEW COMPARISON:  Abdominal radiograph March 02, 2016 FINDINGS: Feeding tube tip projects in mid stomach, advanced from prior imaging. Bowel gas pattern is nondilated. Scattered linear calcifications are unchanged, likely vascular. No intra-abdominal  mass effect. Similar lung base pleural effusions Ing chronic appearing interstitial changes. IMPRESSION: Feeding tube tip projects in mid stomach. Electronically Signed   By: Elon Alas M.D.   On: 03/03/2016 22:18  Current Medications . antiseptic oral rinse  7 mL Mouth Rinse QID  . aspirin  81 mg Per Tube Daily  . atorvastatin  80 mg Per Tube q1800  . chlorhexidine gluconate (SAGE KIT)  15 mL Mouth Rinse BID  . darbepoetin (ARANESP) injection - DIALYSIS  200 mcg Intravenous Q Thu-HD  . feeding supplement (NEPRO CARB STEADY)  1,000 mL Per Tube Q24H  . feeding supplement (PRO-STAT SUGAR FREE 64)  30 mL Per Tube Daily  . ferric gluconate (FERRLECIT/NULECIT) IV  125 mg Intravenous Q T,Th,Sa-HD  . gabapentin  300 mg Oral Q12H  . heparin subcutaneous  5,000 Units Subcutaneous Q8H  . insulin aspart  0-9 Units Subcutaneous Q4H  . ipratropium-albuterol  3 mL Nebulization QID  . isosorbide dinitrate  10 mg Per Tube TID  . metoprolol tartrate  12.5 mg Per Tube BID  . metroNIDAZOLE  500 mg Per Tube TID  . multivitamin  1 tablet Oral QHS  . pantoprazole sodium  40 mg Per Tube Daily    Background: 40 yo with DM,  HTN, HLD, MS, COPD. ESRD on HD since 04/2014 via AVF. Acute resp failure 01/2015 [facility] with AMS, recurrent acute hypoxic resp failure, + trops - NSTEMI vs demand ischemia, GIB, bacteremia (Veillonella; new GPC 7/30 1/2). We were asked to provide his TTS HD  Assessment/Plan:  1. ESRD  TTS [place] (more recently in [facility]). HD today. 2. Pericarditis -TEE neg. Small effusion only. Rub gone.  3. Acute resp failure - per CCM. On trach collar. 4. Bacteremias (veillonella, GCP clusters 7/30) - s/p cefipime, now just on flagyl. No new Rx for GPC (per Dr. Thereasa Solo) 5. NSTEMI vs demand ischemia - anticipate heart cath later this week. Continues to complain of chest pain 6. AI - mild to mod 7. Nutrition TF's but pulled out his feeding  tube 8. Debility 9. Anemia - CKD + ? BL. S/p PRBC's X 1 on 7/24. Getting darbe 200 + ferrlecit. Hb stable. 10. Disposition - will need LTAC. No HD unit in [place] would accept with trach even if SNF takes - would need to be decannulated. Not sure about his [facility] in [place] - they MIGHT take with trach.  Jamal Maes, MD [facility] [phone] Pager 03/05/2016, 8:38 AM

## 2016-03-05 NOTE — Progress Notes (Signed)
Pt refuses CPT at this time - pt states he wants the swallow eval done. Neb tx done. RT sxn trach.

## 2016-03-05 NOTE — Progress Notes (Signed)
SLP Cancellation Note  Patient Details Name: Tanner Campbell MRN: [id number] DOB: 1976-05-11   Cancelled treatment:       Reason Eval/Treat Not Completed: Patient at procedure or test/unavailable. Pt in HD this am for four hours per RN. Will attempt objective test of swallowing in pm today.    Olivier Frayre, Riley Nearing 03/05/2016, 7:56 AM

## 2016-03-05 NOTE — Progress Notes (Signed)
PROGRESS NOTE    Tanner Campbell  IBB:[id number] DOB: Aug 14, 1975 DOA: 03/01/2016 PCP: No primary care provider on file.   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  He decompensated 6/23 and required intubation. The patient was found to have H. Influenza positive sputum and treated with Rocephin. CXR demonstrated a large right pleural effusion and he underwent a thoracentesis (1L serous fluid removed - exudative by protein). He had difficulty with weaning and was transferred to [facility] on 6/29 orally intubated for ventilator weaning. The patient developed fever and antibiotics were expanded to vancomycin, cefepime and diflucan. He had progressed to weaning on PSV x 4 hours as of 7/3. He self extubated on 7/5. Since that time he required BiPAP off and on. After HD 7/10 his mental status worsened and he became minimally responsive. CXR was consistent with RLL opacification and he was re-intubated. He was re-cultured and also found to have increased Troponin for which Cardiology was consulted. He was placed on LMWH, but had a GIB while on this w/ his hgb dropping as low as 8.4.  On 7/14 he remained ventilator dependent. His sputum cultures grew MSSA and E coli and he was noted to have + blood cultures. Because of these acutely worsening issues it was felt best to transfer him to the ICU at [facility].   Subjective: 8/1 A/O 4, able to speak with Passy-Muir valve in place. CoreTrak tube was pulled out accidentally last night.        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 Clinically patient improved will hold on starting any antibiotics. If patient develops fever/bands/left shift will consider restarting. -Physiotherapy vest BID : Patient has been refusing -Resolved  Tracheostomy -Counseled placed consult respiratory therapy continue to attempt to progress patient daily toward decannulation  Bacteremia with positive Veillonella species (Gram negative rod) -Completed Flagyl x 2 weeks for bacteremia as per ID recs   Leukocytosis -Resolved: Some organisms and trach aspirate but without any additional signs of infection would not treat.  Bilateral pleural effusions s/p thoracentesis -See acute hypoxic respiratory failure  NSTEMI vs demand ischemia -Cardiology suggest he will need an eventual cardiac cath  -7/27 spoke with Dr. Lyman Bishop cardiology and we agreed would perform cardiac cath early next week. Continue to maximize respiratory status -Strict in and out since admission + 4.5 L -  Daily weight Filed Weights   03/05/16 0404 03/05/16 0800 03/05/16 1100  Weight: 57.6 kg (127 lb) 56 kg (123 lb 7.3 oz) 56.2 kg (124 lb)  -Manage fluids per HD;See pleural effusion  -8/1 Spoke with PA Wannetta Sender will see patient today and determine date of cardiac catheterization  Small  pericardial effusion with friction rub: Pericarditis? -Cardiology following  - ANA negative  - TEE without evidence of endocarditis/vegetations  - 7/27 EKG: Not consistent with pericarditis. - ESR, CRP significantly elevated. Nonspecific considering patient's multiple other medical problems to include renal failure. Will hold on increasing aspirin at this time.  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 Will wait until swallow evaluation on 8/2 to determine if needs to be replaced  Dysphagia -Swallow study Reevaluation on 8/2  Chronic back pain -Morphine -Neurontin BID -Air mattress, ensure on rotation at all times    Goals of care -PT recommended CIR  - CIR felt he is more appropriate for LTACH - sister does not want him to go back to [facility] because she didn't feel he was being treated well - plan is for SNF placement, but will have to be decannulated as he is ESRD and requires HD and there are no area SNFs that accept ESRD AND trach patients   -Consult psychiatry on 7/29 to ensure patient has competency. Patient's family unrealistic in their expectations therefore would like patient to make decisions if deemed competent -7/30 meeting with patient/sister/brother-in-law explain plan of care for next week 1. attempted to cannulate if possible, 2. Cardiac catheterization 3.  Reevaluate swallow.     DVT prophylaxis: Subcutaneous heparin Code Status: Full Family Communication: None Disposition Plan: Vent SNF   Consultants:  PCCM Dr. KLyman BishopCardiology  ID Nephrology  [facility]   Procedures/Significant Events:  7/21 TEE;- No evidence of endocarditis.-Negative vegetation- Aortic insufficiency is seen, with a central jet. It   is likely due to degenerative changes.   7/24 transfuse 1U PRBC 7/27 PCXR: Cardiomegaly with bilateral from interstitial prominence and bilateral effusions C/W CHF, PNA cannot be excluded.- basilar atelectasis  7/27 transfused 1 unit PRBC    Cultures 7/14 MRSA by PCR negative 7/18 blood right hand x2 pending 7/30 blood right hand NGTD 7/30 blood right arm positive coag negative staph(most likely contaminant) 7/30 trach aspirate positive multiple organisms none predominant   Antimicrobials: Cipro 7/12>7/14 Vanc 7/2>7/17 Cefepime 7/14>7/21 Fluconazole 7/7>7/17 Flagyl 7/17 > 8/1   Devices 7/25 uncuffed #6 [place]>>   LINES / TUBES:  7/29 CorTrak tube placed    Continuous Infusions:         Objective: Vitals:   03/05/16 0900 03/05/16 0930 03/05/16 1000 03/05/16 1100  BP: (!) 83/39 (!) 102/48 103/87 (!) 115/58  Pulse: 72 72 75 70  Resp: 13 15 16 16   Temp:    98.1 F (36.7 C)  TempSrc:  Oral  SpO2:    98%  Weight:    56.2 kg (124 lb)  Height:        Intake/Output Summary (Last 24 hours) at 03/05/16 1229 Last data filed at 03/05/16 1100  Gross per 24 hour  Intake             1144 ml  Output             -296 ml  Net             1440 ml   Filed Weights   03/05/16 0404 03/05/16 0800 03/05/16 1100  Weight: 57.6 kg (127 lb) 56 kg (123 lb 7.3 oz) 56.2 kg (124 lb)    Examination:  General: A/O 4 , follows commands, positive acute respiratory distress (improving) Eyes: negative scleral hemorrhage, negative anisocoria, negative icterus ENT: Negative Runny nose, negative gingival  bleeding, Neck:  Negative scars, masses, torticollis, lymphadenopathy, JVD Lungs: diffuse rhonchi (improved ) Lt >>Rt, negative wheezes or crackles Cardiovascular:  Regular rhythm and rate without murmur gallop or rub normal S1 and S2 Abdomen: negative abdominal pain, nondistended, positive soft, bowel sounds, no rebound, no ascites, no appreciable mass Extremities: No significant cyanosis, clubbing, or edema bilateral lower extremities Skin: Negative rashes, lesions, ulcers Psychiatric:  Unable to evaluate  Central nervous system:  Cranial nerves II through XII intact, follows commands moves extremities   .     Data Reviewed: Care during the described time interval was provided by me .  I have reviewed this patient's available data, including medical history, events of note, physical examination, and all test results as part of my evaluation. I have personally reviewed and interpreted all radiology studies.  CBC:  Recent Labs Lab 02/29/16 0307 03/01/16 0223 03/02/16 0351 03/03/16 1306 03/04/16 0343 03/05/16 0334  WBC 10.0 11.0* 13.2* 7.0 7.7 6.2  NEUTROABS 7.8* 9.1*  --  5.6 5.1  --   HGB 7.2* 10.3* 10.7* 10.0* 10.1* 10.2*  HCT 23.6* 33.2* 34.5* 32.7* 34.2* 34.9*  MCV 90.4 89.2 90.6 92.9 93.2 94.1  PLT 163 160 177 213 221 993   Basic Metabolic Panel:  Recent Labs Lab 02/28/16 1245 02/29/16 0307 03/01/16 0223 03/02/16 0351 03/03/16 1306 03/04/16 0343 03/05/16 0644  NA 135  134* 134* 136 133* 134* 135 132*  K 4.7  4.7 4.9 3.5 4.0 3.3* 4.0 4.1  CL 98*  97* 93* 96* 94* 91* 93* 91*  CO2 27  27 30 30 26 31  32 29  GLUCOSE 99  98 91 110* 93 147* 127* 72  BUN 50*  51* 59* 16 26* 20 29* 47*  CREATININE 3.23*  3.27* 3.69* 1.83* 2.97* 2.62* 3.11* 3.98*  CALCIUM 7.7*  7.7* 7.9* 8.2* 8.4* 8.3* 8.6* 8.5*  MG 1.9 2.0 1.9 2.0 2.1 2.3  --   PHOS 3.9  --   --   --   --   --  4.6   GFR: Estimated Creatinine Clearance: 19.4 mL/min (by C-G formula based on SCr of 3.98  mg/dL). Liver Function Tests:  Recent Labs Lab 02/28/16 1245 03/05/16 0644  ALBUMIN 1.7* 1.9*   No results for input(s): LIPASE, AMYLASE in the last 168 hours. No results for input(s): AMMONIA in the last 168 hours. Coagulation Profile: No results for input(s): INR, PROTIME in the last 168 hours. Cardiac Enzymes:  Recent Labs Lab 03/02/16 2100 03/03/16 0814 03/04/16 1615 03/04/16 2131 03/05/16 0334  TROPONINI 0.07* 0.07*  0.07* 0.06* 0.05* 0.07*   BNP (last  3 results) No results for input(s): PROBNP in the last 8760 hours. HbA1C: No results for input(s): HGBA1C in the last 72 hours. CBG:  Recent Labs Lab 03/04/16 0405 03/04/16 0736 03/04/16 1949 03/05/16 0014 03/05/16 0411  GLUCAP 131* 107* 134* 113* 138*   Lipid Profile: No results for input(s): CHOL, HDL, LDLCALC, TRIG, CHOLHDL, LDLDIRECT in the last 72 hours. Thyroid Function Tests: No results for input(s): TSH, T4TOTAL, FREET4, T3FREE, THYROIDAB in the last 72 hours. Anemia Panel: No results for input(s): VITAMINB12, FOLATE, FERRITIN, TIBC, IRON, RETICCTPCT in the last 72 hours. Urine analysis:    Component Value Date/Time   COLORURINE RED (A) 02/04/2016 1426   APPEARANCEUR CLOUDY (A) 02/04/2016 1426   LABSPEC 1.025 02/04/2016 1426   PHURINE 7.5 02/04/2016 1426   GLUCOSEU 500 (A) 02/04/2016 1426   HGBUR SMALL (A) 02/04/2016 1426   BILIRUBINUR SMALL (A) 02/04/2016 1426   KETONESUR NEGATIVE 02/04/2016 1426   PROTEINUR >300 (A) 02/04/2016 1426   NITRITE NEGATIVE 02/04/2016 1426   LEUKOCYTESUR TRACE (A) 02/04/2016 1426   Sepsis Labs: @LABRCNTIP (procalcitonin:4,lacticidven:4)  ) Recent Results (from the past 240 hour(s))  Culture, blood (routine x 2)     Status: None (Preliminary result)   Collection Time: 03/03/16  1:11 PM  Result Value Ref Range Status   Specimen Description BLOOD BLOOD RIGHT HAND  Final   Special Requests IN PEDIATRIC BOTTLE 3CC  Final   Culture NO GROWTH 1 DAY  Final   Report  Status PENDING  Incomplete  Culture, blood (routine x 2)     Status: Abnormal (Preliminary result)   Collection Time: 03/03/16  1:16 PM  Result Value Ref Range Status   Specimen Description BLOOD BLOOD RIGHT ARM  Final   Special Requests IN PEDIATRIC BOTTLE 3CC  Final   Culture  Setup Time   Final    GRAM POSITIVE COCCI IN CLUSTERS AEROBIC BOTTLE ONLY CRITICAL RESULT CALLED TO, READ BACK BY AND VERIFIED WITH: M TURNER 03/04/16 @ 34 M VESTAL    Culture STAPHYLOCOCCUS SPECIES (COAGULASE NEGATIVE) (A)  Final   Report Status PENDING  Incomplete  Blood Culture ID Panel (Reflexed)     Status: Abnormal   Collection Time: 03/03/16  1:16 PM  Result Value Ref Range Status   Enterococcus species NOT DETECTED NOT DETECTED Final   Vancomycin resistance NOT DETECTED NOT DETECTED Final   Listeria monocytogenes NOT DETECTED NOT DETECTED Final   Staphylococcus species DETECTED (A) NOT DETECTED Final    Comment: CRITICAL RESULT CALLED TO, READ BACK BY AND VERIFIED WITH: M TURNER 03/04/16 @ 1143 M VESTAL    Staphylococcus aureus NOT DETECTED NOT DETECTED Final   Methicillin resistance NOT DETECTED NOT DETECTED Final   Streptococcus species NOT DETECTED NOT DETECTED Final   Streptococcus agalactiae NOT DETECTED NOT DETECTED Final   Streptococcus pneumoniae NOT DETECTED NOT DETECTED Final   Streptococcus pyogenes NOT DETECTED NOT DETECTED Final   Acinetobacter baumannii NOT DETECTED NOT DETECTED Final   Enterobacteriaceae species NOT DETECTED NOT DETECTED Final   Enterobacter cloacae complex NOT DETECTED NOT DETECTED Final   Escherichia coli NOT DETECTED NOT DETECTED Final   Klebsiella oxytoca NOT DETECTED NOT DETECTED Final   Klebsiella pneumoniae NOT DETECTED NOT DETECTED Final   Proteus species NOT DETECTED NOT DETECTED Final   Serratia marcescens NOT DETECTED NOT DETECTED Final   Carbapenem resistance NOT DETECTED NOT DETECTED Final   Haemophilus influenzae NOT DETECTED NOT DETECTED Final    Neisseria meningitidis NOT DETECTED NOT DETECTED Final  Pseudomonas aeruginosa NOT DETECTED NOT DETECTED Final   Candida albicans NOT DETECTED NOT DETECTED Final   Candida glabrata NOT DETECTED NOT DETECTED Final   Candida krusei NOT DETECTED NOT DETECTED Final   Candida parapsilosis NOT DETECTED NOT DETECTED Final   Candida tropicalis NOT DETECTED NOT DETECTED Final  Culture, respiratory (NON-Expectorated)     Status: None   Collection Time: 03/03/16  1:19 PM  Result Value Ref Range Status   Specimen Description TRACHEAL ASPIRATE  Final   Special Requests NONE  Final   Gram Stain   Final    ABUNDANT WBC PRESENT, PREDOMINANTLY PMN RARE SQUAMOUS EPITHELIAL CELLS PRESENT FEW GRAM NEGATIVE COCCI IN PAIRS RARE GRAM NEGATIVE RODS RARE GRAM POSITIVE COCCI IN PAIRS    Culture MULTIPLE ORGANISMS PRESENT, NONE PREDOMINANT  Final   Report Status 03/05/2016 FINAL  Final         Radiology Studies: Dg Abd Portable 1v  Result Date: 03/03/2016 CLINICAL DATA:  Encounter for feeding tube placement. History of diabetes, end-stage renal disease on dialysis, hypertension. EXAM: PORTABLE ABDOMEN - 1 VIEW COMPARISON:  Abdominal radiograph March 02, 2016 FINDINGS: Feeding tube tip projects in mid stomach, advanced from prior imaging. Bowel gas pattern is nondilated. Scattered linear calcifications are unchanged, likely vascular. No intra-abdominal mass effect. Similar lung base pleural effusions Ing chronic appearing interstitial changes. IMPRESSION: Feeding tube tip projects in mid stomach. Electronically Signed   By: Elon Alas M.D.   On: 03/03/2016 22:18       Scheduled Meds: . antiseptic oral rinse  7 mL Mouth Rinse QID  . aspirin  81 mg Per Tube Daily  . atorvastatin  80 mg Per Tube q1800  . chlorhexidine gluconate (SAGE KIT)  15 mL Mouth Rinse BID  . darbepoetin (ARANESP) injection - DIALYSIS  200 mcg Intravenous Q Thu-HD  . feeding supplement (NEPRO CARB STEADY)  1,000 mL Per Tube  Q24H  . feeding supplement (PRO-STAT SUGAR FREE 64)  30 mL Per Tube Daily  . ferric gluconate (FERRLECIT/NULECIT) IV  125 mg Intravenous Q T,Th,Sa-HD  . gabapentin  300 mg Oral Q12H  . heparin subcutaneous  5,000 Units Subcutaneous Q8H  . insulin aspart  0-9 Units Subcutaneous Q4H  . ipratropium-albuterol  3 mL Nebulization QID  . ipratropium-albuterol      . isosorbide dinitrate  10 mg Per Tube TID  . metoprolol tartrate  12.5 mg Per Tube BID  . metroNIDAZOLE  500 mg Per Tube TID  . multivitamin  1 tablet Oral QHS  . pantoprazole sodium  40 mg Per Tube Daily   Continuous Infusions:     LOS: 18 days    Time spent: 40 minutes    Adyen Bifulco, Geraldo Docker, MD [facility] Pager [phone]   If 7PM-7AM, please contact night-coverage [email or web address] Password [facility] 03/05/2016, 12:29 PM

## 2016-03-05 NOTE — Progress Notes (Signed)
Pt refuses CPT Vest due to pain. RN aware. Neb tx done. Trach care done.

## 2016-03-05 NOTE — Procedures (Signed)
Seen on dialysis Continues to c/o CP (had all night) BP 70-80 UF off 2K bath L AVF at 250 some difficulty with cannulation (hematoma from prior infiltration)  Will notify primary service of CP.  Camille Bal, MD [facility] [phone] Pager 03/05/2016, 8:48 AM

## 2016-03-05 NOTE — Progress Notes (Signed)
Called by [facility] for further ischemic w/u of patient.  He was seen by Dr. Rennis Golden last week due to mildly elevated and flat troponin elevation with mild LV dysfunction.  Too sick from respiratory standpoint last week.  Now stable.  Dr. Rennis Golden recommended proceeding with nuclear stress test and only plan cath is large ischemic burden.  Will make NPO after MN for nuclear stress test in am.

## 2016-03-05 DEATH — deceased

## 2016-03-06 ENCOUNTER — Inpatient Hospital Stay (HOSPITAL_COMMUNITY): Payer: Medicare Other

## 2016-03-06 DIAGNOSIS — R7989 Other specified abnormal findings of blood chemistry: Secondary | ICD-10-CM

## 2016-03-06 LAB — NM MYOCAR MULTI W/SPECT W/WALL MOTION / EF
CHL CUP NUCLEAR SDS: 5
CHL CUP NUCLEAR SRS: 12
CHL CUP STRESS STAGE 1 GRADE: 0 %
CHL CUP STRESS STAGE 1 HR: 81 {beats}/min
CHL CUP STRESS STAGE 3 GRADE: 0 %
CHL CUP STRESS STAGE 3 HR: 88 {beats}/min
CHL CUP STRESS STAGE 4 DBP: 60 mmHg
CHL CUP STRESS STAGE 4 GRADE: 0 %
CHL CUP STRESS STAGE 4 HR: 88 {beats}/min
CHL CUP STRESS STAGE 4 SBP: 112 mmHg
CHL CUP STRESS STAGE 4 SPEED: 0 mph
Exercise duration (min): 5 min
LVDIAVOL: 166 mL (ref 62–150)
LVSYSVOL: 105 mL
MPHR: 179 {beats}/min
NUC STRESS TID: 1.15
Percent HR: 51 %
RATE: 0.4
Rest HR: 74 {beats}/min
SSS: 17
Stage 1 Speed: 0 mph
Stage 2 Grade: 0 %
Stage 2 HR: 80 {beats}/min
Stage 2 Speed: 0 mph
Stage 3 DBP: 61 mmHg
Stage 3 SBP: 113 mmHg
Stage 3 Speed: 0 mph

## 2016-03-06 LAB — GLUCOSE, CAPILLARY
GLUCOSE-CAPILLARY: 81 mg/dL (ref 65–99)
GLUCOSE-CAPILLARY: 86 mg/dL (ref 65–99)
GLUCOSE-CAPILLARY: 86 mg/dL (ref 65–99)
Glucose-Capillary: 142 mg/dL — ABNORMAL HIGH (ref 65–99)
Glucose-Capillary: 173 mg/dL — ABNORMAL HIGH (ref 65–99)
Glucose-Capillary: 95 mg/dL (ref 65–99)

## 2016-03-06 MED ORDER — RESOURCE THICKENUP CLEAR PO POWD
ORAL | Status: DC | PRN
  Filled 2016-03-06: qty 125

## 2016-03-06 MED ORDER — OXYCODONE HCL 5 MG PO TABS
5.0000 mg | ORAL_TABLET | ORAL | Status: DC | PRN
  Administered 2016-03-07 – 2016-03-22 (×22): 5 mg via ORAL
  Filled 2016-03-06 (×22): qty 1

## 2016-03-06 MED ORDER — ATORVASTATIN CALCIUM 80 MG PO TABS
80.0000 mg | ORAL_TABLET | Freq: Every day | ORAL | Status: DC
  Administered 2016-03-06 – 2016-03-22 (×17): 80 mg via ORAL
  Filled 2016-03-06 (×17): qty 1

## 2016-03-06 MED ORDER — TECHNETIUM TC 99M TETROFOSMIN IV KIT
10.0000 | PACK | Freq: Once | INTRAVENOUS | Status: AC | PRN
  Administered 2016-03-06: 10 via INTRAVENOUS

## 2016-03-06 MED ORDER — REGADENOSON 0.4 MG/5ML IV SOLN
INTRAVENOUS | Status: AC
  Filled 2016-03-06: qty 5

## 2016-03-06 MED ORDER — DIPHENHYDRAMINE HCL 50 MG/ML IJ SOLN
12.5000 mg | Freq: Once | INTRAMUSCULAR | Status: AC
  Administered 2016-03-06: 12.5 mg via INTRAVENOUS
  Filled 2016-03-06: qty 1

## 2016-03-06 MED ORDER — METOPROLOL TARTRATE 25 MG/10 ML ORAL SUSPENSION
12.5000 mg | Freq: Two times a day (BID) | ORAL | Status: DC
  Administered 2016-03-06 – 2016-03-10 (×7): 12.5 mg via ORAL
  Filled 2016-03-06 (×7): qty 10

## 2016-03-06 MED ORDER — ASPIRIN 81 MG PO CHEW
81.0000 mg | CHEWABLE_TABLET | Freq: Every day | ORAL | Status: DC
  Administered 2016-03-08 – 2016-03-22 (×14): 81 mg via ORAL
  Filled 2016-03-06 (×14): qty 1

## 2016-03-06 MED ORDER — PANTOPRAZOLE SODIUM 40 MG PO PACK
40.0000 mg | PACK | Freq: Every day | ORAL | Status: DC
  Administered 2016-03-08 – 2016-03-10 (×3): 40 mg via ORAL
  Filled 2016-03-06 (×3): qty 20

## 2016-03-06 MED ORDER — ISOSORBIDE DINITRATE 10 MG PO TABS
10.0000 mg | ORAL_TABLET | Freq: Three times a day (TID) | ORAL | Status: DC
  Administered 2016-03-06 – 2016-03-22 (×44): 10 mg via ORAL
  Filled 2016-03-06 (×49): qty 1

## 2016-03-06 MED ORDER — REGADENOSON 0.4 MG/5ML IV SOLN
0.4000 mg | Freq: Once | INTRAVENOUS | Status: AC
  Administered 2016-03-06: 0.4 mg via INTRAVENOUS
  Filled 2016-03-06: qty 5

## 2016-03-06 NOTE — Progress Notes (Signed)
CPT removed from room due to pt refusing.

## 2016-03-06 NOTE — Progress Notes (Signed)
Pt refused CPT this am.

## 2016-03-06 NOTE — Progress Notes (Signed)
Upon moving pt from chair to bed, Pt c/o pain to sacral region after move was finished. Assessed area to find perineal area slightly red but blanchable to touch. Pt was given barrier cream to area and stated that it helped. Will continue to monitor, if pt has any further c/o pain to region, MD will be notified.

## 2016-03-06 NOTE — Progress Notes (Signed)
PULMONARY / CRITICAL CARE MEDICINE   Name: Tanner Campbell MRN: [id number] DOB: 05/19/1976    ADMISSION DATE:  02/24/2016  REFERRING MD:  Sharyon Medicus -->McClung   CHIEF COMPLAINT:  Prolonged critical illness, failure to wean, recurrent sepsis. PCCM following for trach care.   HPI 40 year old male w/ ESRD whom we initially admitted to [facility] from [facility] where he was admitted for ongoing care for PNA and AECOPD. His course was complicated by: HCAP, AECOPD, bacteremia (veillonella species), NSTEMI, pericardial friction rub, GIB, and acute encephalopathy and now severe physical deconditioning and protein calorie malnutrition. Was transferred to [facility] per family request & on-going care. Eventually underwent trach on 7/18 was liberated from the vent and transferred to the IM service. He has been making slow progress. His trach now changed to 6 cuffless and ENT has singed off. PCCM was asked to assist w/ vent trach management and timing of decannulation.    SUBJECTIVE:  Had MBS earlier today, SLP recommending dysphagia 2 diet with chin tucks.  Also had nuclear stress test today.  No changes / ischemia during stress. More awake an conversant today.  Wants to eat.  VITAL SIGNS: BP 132/77   Pulse 71   Temp 97.7 F (36.5 C) (Oral)   Resp 15   Ht 5\' 5"  (1.651 m)   Wt 126 lb (57.2 kg)   SpO2 100%   BMI 20.97 kg/m   VENTILATOR SETTINGS: FiO2 (%):  [28 %] 28 %  INTAKE / OUTPUT: I/O last 3 completed shifts: In: 1144 [NG/GT:1144] Out: -296   PHYSICAL EXAMINATION: General: Awake, alert, in NAD Neuro: Follows commands, oriented, remains weak, mouths words HEENT: Trach in place, 6 cuffless. PMV voice quality is weak though better Cardiac: regular, no murmur Chest: normal work of breathing, few rhonchi Abd: soft, non tender Ext: no edema, decreased muscle bulk Skin: multiple areas of healing excoriations on the lower extremities  LABS:  BMET  Recent Labs Lab 03/03/16 1306 03/04/16 0343  03/05/16 0644  NA 134* 135 132*  K 3.3* 4.0 4.1  CL 91* 93* 91*  CO2 31 32 29  BUN 20 29* 47*  CREATININE 2.62* 3.11* 3.98*  GLUCOSE 147* 127* 72   CBC  Recent Labs Lab 03/03/16 1306 03/04/16 0343 03/05/16 0334  WBC 7.0 7.7 6.2  HGB 10.0* 10.1* 10.2*  HCT 32.7* 34.2* 34.9*  PLT 213 221 217    STUDIES:  7/13 Echo >> EF 40 to 45%, mod LVH, small pericardial effusion 7/17 CXR >> stable bilateral pleural effusions, stable patchy opacities in the lung bases- pneumonia vs atelectasis ANA neg  CULTURES: BCX2 7/10: Veillonella species (Gram negative coccobacilli) resp culture 7/10: Moderate SA and E.coli (both pan sensitive)  ANTIBIOTICS: cipro 7/12>>>7/14 vanc 7/2>>>7/17 Cefepime 7/14>>>7/21 Fluconazole 7/7>>>7/17 Flagyl 7/17 >>>   SIGNIFICANT EVENTS: 7/14 Transfer from [facility] to [facility] 7/18 Trach placed 7/22 off vent to SDU. 7/25 PCCM asked to assist w/ trach care   LINES/TUBES: OETT 7/10>>>7/17; 7/17 >>> Right IJ PICC (? Insertion date)>>> Trach 7/18     ASSESSMENT / PLAN:  Problem list: Acute hypoxic respiratory failure - s/p trach 7/18 - HCAP Bacteremia with Veillonella species (Gram negative rod) Bilateral pleural effusions s/p thoracentesis NSTEMI vs demand ischemia Small pericardial effusion with friction rub Mild to moderate aortic regurgitation ESRD on HD Recent GIB on LMWH Anemia of chronic kidney disease + acute blood loss  DM type II w/ hyperglycemia  Acute Encephalopathy in setting of critical illness Severe malnutrition in context  of acute illness  Discussion: Tracheostomy dependent (7/18) s/p prolonged critical illness which was complicated by: HCAP, AECOPD, bacteremia (veillonella species), NSTEMI, pericardial friction rub, GIB, and acute encephalopathy and now severe physical deconditioning and protein calorie malnutrition.    Plan Continue to work w/ SLP for improved voice quality Continue aggressive PT  Continue routine trach care   When he is stronger & ambulating, we can initiate trach capping trials to assess for decannulation  Once ambulating, would also assess ambulatory desat study.  [facility] vs other SNF that accepts trach-HD (pt's sister does not want him to return to [facility])   PCCM will follow intermittently for trach / vent needs.   Tanner Campbell, [place] [facility] Pager: [phone]  or [phone] 03/06/2016, 3:08 PM  Attending Note:  40 year old male with critical illness due to HCAP and AE of COPD now trached.  On exam, he is cachectic with trach in place and coarse BS diffusely.  I reviewed CXR myself, trach is in good position.  Patient is interactive.  Discussed with PCCM-NP and bedside RN.  Acute on chronic respiratory failure:             - Continue weaning efforts.  Tracheostomy status:             - Maintain current trach size and type.             - May proceed with PMV if able.  - No decannulation until more physically fit.  Hypoxemia:             - Titrate O2 for sat of 88-92%.             - May need ambulatory desat study when able to ambulate for home O2.  COPD:             - Bronchodilators as ordered.             - PRN albuterol.  Patient seen and examined, agree with above note.  I dictated the care and orders written for this patient under my direction.  Alyson Reedy, MD [phone]

## 2016-03-06 NOTE — Progress Notes (Signed)
Physical Therapy Treatment Patient Details Name: Tanner Campbell MRN: [id number] DOB: February 05, 1976 Today's Date: 03/06/2016    History of Present Illness Tanner Campbell is an 39 y.o. 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 He was intubated 6/23-7/5 (self extubated), 7/10-7/16 (self extubated), was then reintubated. Initially, sputum was positive for H influenza and subsequently for MSSA and Escherichia coli and blood cultures growing a gram-negative anaerobic organism. Thoracentesis on right showed borderline exudate by LDH criteria. 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 Patient is highly motivated. Family present and report pt drove himself from [place] to [place] prior to admission. Highly functional.  Follow Up Recommendations  Supervision/Assistance - 24 hour;SNF     Equipment Recommendations  Other (comment) (TBA)    Recommendations for Other Services       Precautions / Restrictions Precautions Precautions: Fall Precaution Comments: Trach; Dys 2 with nectar thick liquids Restrictions Weight Bearing Restrictions: No    Mobility  Bed Mobility Overal bed mobility: Needs Assistance Bed Mobility: Rolling;Sidelying to Sit Rolling: Supervision Sidelying to sit: HOB elevated;Min assist       General bed mobility comments: uses rail; HOB elevated; vc for technique; assist to raise torso due to weakness  Transfers Overall transfer level: Needs assistance Equipment used: None   Sit to Stand: Mod assist         General transfer comment: stood from normal height bed; began taking pivotal steps to chair (very small) with increasing dizziness and LE fatigue; mod assist to  complete pivot  Ambulation/Gait                 Stairs            Wheelchair Mobility    Modified Rankin (Stroke Patients Only)       Balance   Sitting-balance support: No upper extremity supported;Feet supported Sitting balance-Leahy Scale: Fair     Standing balance support: No upper extremity supported Standing balance-Leahy Scale: Poor                      Cognition Arousal/Alertness: Awake/alert Behavior During Therapy: WFL for tasks assessed/performed Overall Cognitive Status: Within Functional Limits for tasks assessed   Orientation Level:  (thinks he is in [place] and that it is August)                  Exercises General Exercises - Lower Extremity Long Arc Quad: Strengthening;Both;10 reps;Seated    General Comments General comments (skin integrity, edema, etc.): See separate note re: family giving pt a candy bar to eat      Pertinent Vitals/Pain Pain Assessment: No/denies pain    Home Living                      Prior Function            PT Goals (current goals can now be found in the care plan section) Acute Rehab PT Goals Patient Stated Goal: to get stronger Time For Goal Achievement: 03/19/16 Progress towards PT goals: Progressing toward goals    Frequency  Min 3X/week    PT Plan Discharge plan needs to  be updated (denied inpatient rehab)    Co-evaluation             End of Session Equipment Utilized During Treatment: Oxygen;Gait belt (trach) Activity Tolerance: Patient tolerated treatment well Patient left: with call bell/phone within reach;with nursing/sitter in room;in chair;with chair alarm set     Time: 1700-1738 PT Time Calculation (min) (ACUTE ONLY): 38 min  Charges:  $Therapeutic Exercise: 8-22 mins $Therapeutic Activity: 8-22 mins                    G Codes:      [name] March 11, 2016, 5:53 PM  Pager [phone]

## 2016-03-06 NOTE — Progress Notes (Signed)
MBSS complete. Full report located under chart review in imaging section.  Nicolaas Savo Paiewonsky, M.A. CCC-SLP [phone]  

## 2016-03-06 NOTE — Progress Notes (Signed)
Patient sister asked what patient could have that was sweet to eat. NT told patients sister pudding. Later RN in room, patient eating candy bar. RN informed patient of his new diet and restrictions, patient continued to eat candy bar, even after told it was not a good idea. RN will continue to monitor. C.Jackson Fetters,RN

## 2016-03-06 NOTE — Progress Notes (Signed)
Tanner Campbell TEAM 1 - Stepdown/ICU TEAM  Tanner Campbell  FWY:[id number] DOB: 01/02/1976 DOA: 02/24/2016 PCP: No primary care provider on file.    Brief Narrative:  40 y/o M with Hx of DM2, HTN, HLD, ESRD on HD (T/Th/S), COPD, and prior back surgery who initially was admitted to [facility] from 6/21 - 6/29 for acute respiratory failure in the setting of volume overload.  He decompensated 6/23 and required intubation. The patient was found to have H. Influenza positive sputum and treated with Rocephin. CXR demonstrated a large right pleural effusion and he underwent a thoracentesis (1L serous fluid removed - exudative by protein). He had difficulty with weaning and was transferred to [facility] on 6/29 orally intubated for ventilator weaning. The patient developed fever and antibiotics were expanded to vancomycin, cefepime and diflucan. He had progressed to weaning on PSV x 4 hours as of 7/3. He self extubated on 7/5 but required BiPAP off and on thereafter. After HD 7/10his mental status worsened and he became minimally responsive. CXR was consistent with RLL opacification and he was re-intubated. He was re-cultured and also found to have increased Troponin for which Cardiology was consulted. He was placed on LMWH, but had a GIB while on this w/ his hgb dropping as low as 8.4.  On 7/14 he remained ventilator dependent. His sputum cultures grew MSSA and E coli and he was noted to have + blood cultures. Because of these acutely worsening issues it was felt best to transfer him to the ICU at [facility].   Significant Events: 6/21 admitted at [facility] 6/29 transfer to [facility] 7/14 transfer from [facility] to [facility] 7/18 trach placed  Subjective: The patient is awake and alert.  He is more conversant than I have seen him.  He is frustrated that he has not been allowed to eat today as he is nothing by mouth for a nuclear medicine stress test.  He denies chest pain shortness of breath fevers or chills.  He states he is having abdominal cramps  from being hungry.  Assessment & Plan:  Acute hypoxic respiratory failure - s/p trach 7/18 - HCAP Ventilator management and trach care per PCCM - has completed a course of cefepime - currently tolerating trach collar without difficulty - refusing to use vibravest therefore I have discontinued the order  Coag negative Staph species in blood cx Blood cx obtained 7/30 1 of 2 + - pt does not appear toxic - afeb w/ normal WBC - most consistent with contaminant  Bacteremia with Veillonella species (Gram negative rod) continue Flagyl x 2 weeks for bacteremia as per ID recs (has completed 13 days as of today)  Bilateral pleural effusions s/p thoracentesis Stable on follow-up chest x-ray 7/21 - no respiratory distress w/ stable oxygenation  NSTEMI vs demand ischemia Undergoing nuclear medicine stress test today  Small pericardial effusion with friction rub Cardiology following - ANA negative - TEE without evidence of endocarditis/vegetations - hemodynamically stable - rub has resolved  Mild to moderate aortic regurgitation   ESRD on HD Tue/Th/Sat Nephrology following   Recent GIB on LMWH No evidence of acute blood loss a this time   Anemia of chronic kidney disease + acute blood loss  No evidence of blood loss, but Hgb drifted down to 7.1 - s/p 1U PRBC 7/24 - Hgb has improved as expected and now appears stable  Recent Labs Lab 03/01/16 0223 03/02/16 0351 03/03/16 1306 03/04/16 0343 03/05/16 0334  HGB 10.3* 10.7* 10.0* 10.1* 10.2*    DM  type II w/ hyperglycemia  CBG stable at this time   Acute Encephalopathy in setting of critical illness Resolved    Severe malnutrition in context of acute illness Patient is much more alert and has now been cleared for diet - discontinue tube feeds  Dysphagia Cleared for modified diet by SLP  Disposition Planning  PT recommended CIR - CIR felt he is more appropriate for LTACH - sister does not want him to go back to [facility] because she  didn't feel he was being treated well - plan is for SNF placement, but will have to be decannulated as he is ESRD and requires HD and there are no area SNFs that accept ESRD AND trach patients - as the patient's mental status has improved we will now defer to him for decisions on placement  DVT prophylaxis: SQ heparin  Code Status: FULL CODE Family Communication: no family present at time of exam  Disposition Plan: SDU   Consultants:  PCCM Cardiology  ID Nephrology  ENT  Antimicrobials:  Cipro 7/12>7/14 Vanc 7/2>7/17 Cefepime 7/14>7/21 Fluconazole 7/7>7/17 Flagyl 7/17 > 8/3 w/ some interruptions   Objective: Blood pressure 138/66, pulse 82, temperature 97.7 F (36.5 C), temperature source Oral, resp. rate 19, height 5' 5"  (1.651 m), weight 57.2 kg (126 lb), SpO2 100 %.  Intake/Output Summary (Last 24 hours) at 03/06/16 0945 Last data filed at 03/05/16 1100  Gross per 24 hour  Intake                0 ml  Output             -296 ml  Net              296 ml   Filed Weights   03/05/16 0800 03/05/16 1100 03/06/16 0355  Weight: 56 kg (123 lb 7.3 oz) 56.2 kg (124 lb) 57.2 kg (126 lb)    Examination: General: No acute respiratory distress - alert and conversant  Lungs: no wheeze or focal crackles - on TC  Cardiovascular: Regular rate and rhythm w/o rub  Abdomen: Nondistended, soft, bowel sounds positive Extremities: No significant cyanosis, clubbing, or edema B LE   CBC:  Recent Labs Lab 02/29/16 0307 03/01/16 0223 03/02/16 0351 03/03/16 1306 03/04/16 0343 03/05/16 0334  WBC 10.0 11.0* 13.2* 7.0 7.7 6.2  NEUTROABS 7.8* 9.1*  --  5.6 5.1  --   HGB 7.2* 10.3* 10.7* 10.0* 10.1* 10.2*  HCT 23.6* 33.2* 34.5* 32.7* 34.2* 34.9*  MCV 90.4 89.2 90.6 92.9 93.2 94.1  PLT 163 160 177 213 221 267   Basic Metabolic Panel:  Recent Labs Lab 02/28/16 1245 02/29/16 0307 03/01/16 0223 03/02/16 0351 03/03/16 1306 03/04/16 0343 03/05/16 0644  NA 135  134* 134* 136 133*  134* 135 132*  K 4.7  4.7 4.9 3.5 4.0 3.3* 4.0 4.1  CL 98*  97* 93* 96* 94* 91* 93* 91*  CO2 27  27 30 30 26 31  32 29  GLUCOSE 99  98 91 110* 93 147* 127* 72  BUN 50*  51* 59* 16 26* 20 29* 47*  CREATININE 3.23*  3.27* 3.69* 1.83* 2.97* 2.62* 3.11* 3.98*  CALCIUM 7.7*  7.7* 7.9* 8.2* 8.4* 8.3* 8.6* 8.5*  MG 1.9 2.0 1.9 2.0 2.1 2.3  --   PHOS 3.9  --   --   --   --   --  4.6     GFR: Estimated Creatinine Clearance: 19.8 mL/min (by C-G formula based on SCr  of 3.98 mg/dL).  Liver Function Tests:  Recent Labs Lab 02/28/16 1245 03/05/16 0644  ALBUMIN 1.7* 1.9*    Cardiac Enzymes:  Recent Labs Lab 03/02/16 2100 03/03/16 0814 03/04/16 1615 03/04/16 2131 03/05/16 0334  TROPONINI 0.07* 0.07*  0.07* 0.06* 0.05* 0.07*    HbA1C: Hgb A1c MFr Bld  Date/Time Value Ref Range Status  02/02/2016 06:50 AM 7.7 (H) 4.8 - 5.6 % Final    Comment:    (NOTE)         Pre-diabetes: 5.7 - 6.4         Diabetes: >6.4         Glycemic control for adults with diabetes: <7.0   01/25/2016 04:32 AM 8.2 (H) 4.0 - 6.0 % Final    CBG:  Recent Labs Lab 03/05/16 1607 03/05/16 1944 03/05/16 2350 03/06/16 0357 03/06/16 0815  GLUCAP 95 82 86 81 86    Recent Results (from the past 240 hour(s))  Culture, blood (routine x 2)     Status: None (Preliminary result)   Collection Time: 03/03/16  1:11 PM  Result Value Ref Range Status   Specimen Description BLOOD BLOOD RIGHT HAND  Final   Special Requests IN PEDIATRIC BOTTLE 3CC  Final   Culture NO GROWTH 2 DAYS  Final   Report Status PENDING  Incomplete  Culture, blood (routine x 2)     Status: Abnormal   Collection Time: 03/03/16  1:16 PM  Result Value Ref Range Status   Specimen Description BLOOD BLOOD RIGHT ARM  Final   Special Requests IN PEDIATRIC BOTTLE 3CC  Final   Culture  Setup Time   Final    GRAM POSITIVE COCCI IN CLUSTERS AEROBIC BOTTLE ONLY CRITICAL RESULT CALLED TO, READ BACK BY AND VERIFIED WITH: M TURNER 03/04/16 @  34 M VESTAL    Culture (A)  Final    STAPHYLOCOCCUS SPECIES (COAGULASE NEGATIVE) THE SIGNIFICANCE OF ISOLATING THIS ORGANISM FROM A SINGLE SET OF BLOOD CULTURES WHEN MULTIPLE SETS ARE DRAWN IS UNCERTAIN. PLEASE NOTIFY THE MICROBIOLOGY DEPARTMENT WITHIN ONE WEEK IF SPECIATION AND SENSITIVITIES ARE REQUIRED.    Report Status 03/06/2016 FINAL  Final  Blood Culture ID Panel (Reflexed)     Status: Abnormal   Collection Time: 03/03/16  1:16 PM  Result Value Ref Range Status   Enterococcus species NOT DETECTED NOT DETECTED Final   Vancomycin resistance NOT DETECTED NOT DETECTED Final   Listeria monocytogenes NOT DETECTED NOT DETECTED Final   Staphylococcus species DETECTED (A) NOT DETECTED Final    Comment: CRITICAL RESULT CALLED TO, READ BACK BY AND VERIFIED WITH: M TURNER 03/04/16 @ 63 M VESTAL    Staphylococcus aureus NOT DETECTED NOT DETECTED Final   Methicillin resistance NOT DETECTED NOT DETECTED Final   Streptococcus species NOT DETECTED NOT DETECTED Final   Streptococcus agalactiae NOT DETECTED NOT DETECTED Final   Streptococcus pneumoniae NOT DETECTED NOT DETECTED Final   Streptococcus pyogenes NOT DETECTED NOT DETECTED Final   Acinetobacter baumannii NOT DETECTED NOT DETECTED Final   Enterobacteriaceae species NOT DETECTED NOT DETECTED Final   Enterobacter cloacae complex NOT DETECTED NOT DETECTED Final   Escherichia coli NOT DETECTED NOT DETECTED Final   Klebsiella oxytoca NOT DETECTED NOT DETECTED Final   Klebsiella pneumoniae NOT DETECTED NOT DETECTED Final   Proteus species NOT DETECTED NOT DETECTED Final   Serratia marcescens NOT DETECTED NOT DETECTED Final   Carbapenem resistance NOT DETECTED NOT DETECTED Final   Haemophilus influenzae NOT DETECTED NOT DETECTED Final  Neisseria meningitidis NOT DETECTED NOT DETECTED Final   Pseudomonas aeruginosa NOT DETECTED NOT DETECTED Final   Candida albicans NOT DETECTED NOT DETECTED Final   Candida glabrata NOT DETECTED NOT  DETECTED Final   Candida krusei NOT DETECTED NOT DETECTED Final   Candida parapsilosis NOT DETECTED NOT DETECTED Final   Candida tropicalis NOT DETECTED NOT DETECTED Final  Culture, respiratory (NON-Expectorated)     Status: None   Collection Time: 03/03/16  1:19 PM  Result Value Ref Range Status   Specimen Description TRACHEAL ASPIRATE  Final   Special Requests NONE  Final   Gram Stain   Final    ABUNDANT WBC PRESENT, PREDOMINANTLY PMN RARE SQUAMOUS EPITHELIAL CELLS PRESENT FEW GRAM NEGATIVE COCCI IN PAIRS RARE GRAM NEGATIVE RODS RARE GRAM POSITIVE COCCI IN PAIRS    Culture MULTIPLE ORGANISMS PRESENT, NONE PREDOMINANT  Final   Report Status 03/05/2016 FINAL  Final     Scheduled Meds: . antiseptic oral rinse  7 mL Mouth Rinse QID  . aspirin  81 mg Per Tube Daily  . atorvastatin  80 mg Per Tube q1800  . chlorhexidine gluconate (SAGE KIT)  15 mL Mouth Rinse BID  . darbepoetin (ARANESP) injection - DIALYSIS  200 mcg Intravenous Q Thu-HD  . feeding supplement (NEPRO CARB STEADY)  1,000 mL Per Tube Q24H  . feeding supplement (PRO-STAT SUGAR FREE 64)  30 mL Per Tube Daily  . ferric gluconate (FERRLECIT/NULECIT) IV  125 mg Intravenous Q T,Th,Sa-HD  . gabapentin  300 mg Oral Q12H  . heparin subcutaneous  5,000 Units Subcutaneous Q8H  . insulin aspart  0-9 Units Subcutaneous Q4H  . ipratropium-albuterol  3 mL Nebulization QID  . isosorbide dinitrate  10 mg Per Tube TID  . metoprolol tartrate  12.5 mg Per Tube BID  . multivitamin  1 tablet Oral QHS  . pantoprazole sodium  40 mg Per Tube Daily     LOS: 19 days    Cherene Altes, MD [facility] Office  [phone] Pager - Text Page per Amion as per below:  On-Call/Text Page:      [email or web address]      password [email or web address]  If 7PM-7AM, please contact night-coverage [email or web address] Password [email or web address] 03/06/2016, 9:45 AM

## 2016-03-06 NOTE — Progress Notes (Signed)
Neb and trach ck not done due to pt not on floor. Pt having a stress test at this time.

## 2016-03-06 NOTE — Progress Notes (Signed)
Planned to repeat swallow eval this am, but per chart pt to have nuclear stress test in the am and must remain NPO. Will follow for readiness for swallow eval later today. Harlon Ditty, MA CCC-SLP [phone]

## 2016-03-06 NOTE — Progress Notes (Signed)
[facility] Rounding Note Subjective/Interval History:   No further c/o chest pain Had nuclear stress test earlier - global hypokinesis, evidence old infarct, intermediate risk study About to eat lunch  Objective: Vital signs in last 24 hours: Temp:  [97.5 F (36.4 C)-98.5 F (36.9 C)] 97.7 F (36.5 C) (08/02 0817) Pulse Rate:  [71-89] 71 (08/02 1444) Resp:  [10-19] 15 (08/02 1444) BP: (106-148)/(48-77) 132/77 (08/02 1444) SpO2:  [92 %-100 %] 100 % (08/02 1444) FiO2 (%):  [28 %] 28 % (08/02 1444) Weight:  [57.2 kg (126 lb)] 57.2 kg (126 lb) (08/02 0355) Weight change: -1.607 kg (-3 lb 8.7 oz)   Physical Examination BP 132/77   Pulse 71   Temp 97.7 F (36.5 C) (Oral)   Resp 15   Ht 5' 5" (1.651 m)   Wt 57.2 kg (126 lb)   SpO2 100%   BMI 20.97 kg/m   NAD just looks depressed Trach collar Lungs ant clear Cardiac rhythm regular S1S2 No s3 Abd soft + BS In SCD's.  Minimal if any edema  Left upper arm AVF with + bruit and thrill, bruising from prior infiltration - rather firm hematoma  Lab Results:  Recent Labs  03/04/16 0343 03/05/16 0334  WBC 7.7 6.2  HGB 10.1* 10.2*  HCT 34.2* 34.9*  PLT 221 217    Recent Labs  03/04/16 0343 03/05/16 0644  NA 135 132*  K 4.0 4.1  CL 93* 91*  CO2 32 29  GLUCOSE 127* 72  BUN 29* 47*  CREATININE 3.11* 3.98*  CALCIUM 8.6* 8.5*  Phosphorus                         4.6  Studies/Results: Nm Myocar Multi W/spect W/wall Motion / Ef  Result Date: 03/06/2016  There was no ST segment deviation noted during stress.  Defect 1: There is a medium defect of severe severity present in the basal inferior and mid inferior location.  Findings consistent with prior myocardial infarction.  This is an intermediate risk study.  The left ventricular ejection fraction is moderately decreased (30-44%).  Global hypokinesis worse in the inferior and inferoseptal regions.    Dg Swallowing Func-speech Pathology  Result Date: 03/06/2016 Objective  Swallowing Evaluation: Type of Study: MBS-Modified Barium Swallow Study Patient Details Name: Tanner Campbell MRN: [id number] Date of Birth: 22-Apr-1976 Today's Date: 03/06/2016 Time: SLP Start Time (ACUTE ONLY): 1358-SLP Stop Time (ACUTE ONLY): 1417 SLP Time Calculation (min) (ACUTE ONLY): 19 min Past Medical History: Past Medical History: Diagnosis Date . COPD (chronic obstructive pulmonary disease) ([place])  . Diabetes mellitus without complication ([place])  . Hemodialysis patient ([place])  . High cholesterol  . Hypertension  . Neuropathy ([place])  . Renal disorder  Past Surgical History: Past Surgical History: Procedure Laterality Date . BACK SURGERY   . CHOLECYSTECTOMY   . NECK SURGERY   . TEE WITHOUT CARDIOVERSION N/A 02/08/2016  Procedure: TRANSESOPHAGEAL ECHOCARDIOGRAM (TEE);  Surgeon: Sanda Klein, MD;  Location: [place];  Service: Cardiovascular;  Laterality: N/A; . TRACHEOSTOMY TUBE PLACEMENT N/A 02/13/2016  Procedure: TRACHEOSTOMY;  Surgeon: Leta Baptist, MD;  Location: [facility];  Service: ENT;  Laterality: N/A; HPI: 40 year old male admitted 02/06/2016 due to recurrent sepsis, failure to wean, multiple intubations. PMH significant forDM2, HTN, HLD, ESRD on HD, COPD. Trach placed 02/09/2016.  Subjective: pt alert, eager to eat/drink Assessment / Plan / Recommendation CHL IP CLINICAL IMPRESSIONS 03/06/2016 Therapy Diagnosis Moderate pharyngeal phase dysphagia Clinical Impression Pt  has a moderate pharyngeal dysphagia with sensorimotor deficits. Oral phase is generally with functional limits, but pharyngeal trigger is delayed. This results in deep, silent penetration before the swallow with thin liquids. Silent aspiration occurs with nectar thick liquids after the swallow secondary to residue remaining in the valleculae and pyriform sinuses, which he attempts to spontaneously clear with subswallows. Volitional cough/throat clearing is weak despite use of PMV and SLP cueing for increased force. Use of a chin tuck is effective at  reducing pharyngeal residuals, therefore increasing airway protection. Recommend Dys 2 diet and nectar thick liquids with strict use of chin tuck. Pt should also wear PMV during all intake. Will continue to follow for tolerance and readiness to advance. Impact on safety and function Mild aspiration risk;Moderate aspiration risk   CHL IP TREATMENT RECOMMENDATION 03/06/2016 Treatment Recommendations Therapy as outlined in treatment plan below   Prognosis 03/06/2016 Prognosis for Safe Diet Advancement Good Barriers to Reach Goals -- Barriers/Prognosis Comment -- CHL IP DIET RECOMMENDATION 03/06/2016 SLP Diet Recommendations Dysphagia 2 (Fine chop) solids;Nectar thick liquid Liquid Administration via Cup;No straw Medication Administration Crushed with puree Compensations Slow rate;Small sips/bites;Multiple dry swallows after each bite/sip;Chin tuck Postural Changes Remain semi-upright after after feeds/meals (Comment);Seated upright at 90 degrees   CHL IP OTHER RECOMMENDATIONS 03/06/2016 Recommended Consults -- Oral Care Recommendations Oral care BID Other Recommendations Order thickener from pharmacy;Prohibited food (jello, ice cream, thin soups);Remove water pitcher;Place PMSV during PO intake   CHL IP FOLLOW UP RECOMMENDATIONS 03/06/2016 Follow up Recommendations Skilled Nursing facility   [facility] IP FREQUENCY AND DURATION 03/06/2016 Speech Therapy Frequency (ACUTE ONLY) min 2x/week Treatment Duration 2 weeks      CHL IP ORAL PHASE 03/06/2016 Oral Phase WFL Oral - Pudding Teaspoon -- Oral - Pudding Cup -- Oral - Honey Teaspoon -- Oral - Honey Cup -- Oral - Nectar Teaspoon -- Oral - Nectar Cup -- Oral - Nectar Straw -- Oral - Thin Teaspoon -- Oral - Thin Cup -- Oral - Thin Straw -- Oral - Puree -- Oral - Mech Soft -- Oral - Regular -- Oral - Multi-Consistency -- Oral - Pill -- Oral Phase - Comment --  CHL IP PHARYNGEAL PHASE 03/06/2016 Pharyngeal Phase Impaired Pharyngeal- Pudding Teaspoon -- Pharyngeal -- Pharyngeal- Pudding Cup --  Pharyngeal -- Pharyngeal- Honey Teaspoon Delayed swallow initiation-vallecula;Reduced epiglottic inversion;Reduced anterior laryngeal mobility;Reduced laryngeal elevation;Reduced airway/laryngeal closure;Reduced tongue base retraction;Pharyngeal residue - valleculae;Pharyngeal residue - pyriform Pharyngeal -- Pharyngeal- Honey Cup Delayed swallow initiation-vallecula;Reduced epiglottic inversion;Reduced anterior laryngeal mobility;Reduced laryngeal elevation;Reduced airway/laryngeal closure;Reduced tongue base retraction;Pharyngeal residue - valleculae;Pharyngeal residue - pyriform;Compensatory strategies attempted (with notebox) Pharyngeal -- Pharyngeal- Nectar Teaspoon Delayed swallow initiation-vallecula;Reduced epiglottic inversion;Reduced anterior laryngeal mobility;Reduced laryngeal elevation;Reduced airway/laryngeal closure;Reduced tongue base retraction;Pharyngeal residue - valleculae;Pharyngeal residue - pyriform Pharyngeal Material does not enter airway Pharyngeal- Nectar Cup Delayed swallow initiation-vallecula;Reduced epiglottic inversion;Reduced anterior laryngeal mobility;Reduced laryngeal elevation;Reduced airway/laryngeal closure;Reduced tongue base retraction;Pharyngeal residue - valleculae;Pharyngeal residue - pyriform;Penetration/Apiration after swallow;Compensatory strategies attempted (with notebox) Pharyngeal Material enters airway, passes BELOW cords without attempt by patient to eject out (silent aspiration) Pharyngeal- Nectar Straw -- Pharyngeal -- Pharyngeal- Thin Teaspoon NT Pharyngeal -- Pharyngeal- Thin Cup Reduced epiglottic inversion;Reduced anterior laryngeal mobility;Reduced laryngeal elevation;Reduced airway/laryngeal closure;Reduced tongue base retraction;Pharyngeal residue - valleculae;Pharyngeal residue - pyriform;Delayed swallow initiation-pyriform sinuses;Compensatory strategies attempted (with notebox) Pharyngeal -- Pharyngeal- Thin Straw Reduced epiglottic inversion;Reduced  anterior laryngeal mobility;Reduced laryngeal elevation;Reduced airway/laryngeal closure;Reduced tongue base retraction;Pharyngeal residue - valleculae;Pharyngeal residue - pyriform;Penetration/Aspiration before swallow;Delayed swallow initiation-pyriform sinuses;Compensatory strategies attempted (with notebox) Pharyngeal  Material enters airway, CONTACTS cords and not ejected out Pharyngeal- Puree Delayed swallow initiation-vallecula;Reduced epiglottic inversion;Reduced anterior laryngeal mobility;Reduced laryngeal elevation;Reduced airway/laryngeal closure;Reduced tongue base retraction;Pharyngeal residue - valleculae;Pharyngeal residue - pyriform;Compensatory strategies attempted (with notebox) Pharyngeal -- Pharyngeal- Mechanical Soft Delayed swallow initiation-vallecula;Reduced epiglottic inversion;Reduced anterior laryngeal mobility;Reduced laryngeal elevation;Reduced airway/laryngeal closure;Reduced tongue base retraction;Pharyngeal residue - valleculae;Pharyngeal residue - pyriform;Compensatory strategies attempted (with notebox) Pharyngeal -- Pharyngeal- Regular -- Pharyngeal -- Pharyngeal- Multi-consistency -- Pharyngeal -- Pharyngeal- Pill -- Pharyngeal -- Pharyngeal Comment --  CHL IP CERVICAL ESOPHAGEAL PHASE 03/06/2016 Cervical Esophageal Phase WFL Pudding Teaspoon -- Pudding Cup -- Honey Teaspoon -- Honey Cup -- Nectar Teaspoon -- Nectar Cup -- Nectar Straw -- Thin Teaspoon -- Thin Cup -- Thin Straw -- Puree -- Mechanical Soft -- Regular -- Multi-consistency -- Pill -- Cervical Esophageal Comment -- No flowsheet data found. Germain Osgood 03/06/2016, 2:36 PM  Germain Osgood, M.A. CCC-SLP [phone]             Current Medications . antiseptic oral rinse  7 mL Mouth Rinse QID  . [START ON 03/07/2016] aspirin  81 mg Oral Daily  . atorvastatin  80 mg Oral q1800  . chlorhexidine gluconate (SAGE KIT)  15 mL Mouth Rinse BID  . darbepoetin (ARANESP) injection - DIALYSIS  200 mcg Intravenous Q Thu-HD   . ferric gluconate (FERRLECIT/NULECIT) IV  125 mg Intravenous Q T,Th,Sa-HD  . gabapentin  300 mg Oral Q12H  . heparin subcutaneous  5,000 Units Subcutaneous Q8H  . insulin aspart  0-9 Units Subcutaneous Q4H  . ipratropium-albuterol  3 mL Nebulization QID  . isosorbide dinitrate  10 mg Oral TID  . metoprolol tartrate  12.5 mg Oral BID  . multivitamin  1 tablet Oral QHS  . [START ON 03/07/2016] pantoprazole sodium  40 mg Oral Daily  . regadenoson        Background: 40 yo with DM, HTN, HLD, MS, COPD. ESRD on HD since 04/2014 via AVF. Acute resp failure 01/2015 [facility] with AMS, recurrent acute hypoxic resp failure, + trops - NSTEMI vs demand ischemia, GIB, bacteremia (Veillonella; new GPC 7/30 1/2). We were asked to provide his TTS HD  Assessment/Plan:  1. ESRD  TTS [facility] (more recently in [facility]). HD Thurs 2. Pericarditis -TEE neg. Small effusion only. Rub gone. 3. Chest pain. No inducible ischemia on nuclear stress test today but evidence old infarct, global  hypokinesis, reduced EF 30-44%. Cards following.  4. Acute on chronic resp failure - per CCM. On trach collar. No decannulation unti more physically fit per CCM. 5. Bacteremias (veillonella, GCP clusters 7/30) - s/p cefipime, and finished 14 days flagyl.  No new Rx for GPC as felt to be contaminant  6. AI - mild to mod 7. Nutrition TF's but pulled out his feeding tube 8. Debility 9. Anemia - CKD + ? BL. S/p PRBC's X 1 on 7/24. Getting darbe 200 + ferrlecit. Hb stable. 10. Disposition - will need LTAC. No HD unit in [place] would accept with trach even if SNF takes - would need to be decannulated. Not sure about his [facility] in [place] - they MIGHT take with trach.  Jamal Maes, MD [facility] [phone] Pager 03/06/2016, 3:53 PM

## 2016-03-06 NOTE — Progress Notes (Signed)
   Tanner Campbell presented for a [place] cardiolite today.  No immediate complications.  Stress imaging is pending at this time.  Laverda Page, NP 03/06/2016, 12:33 PM

## 2016-03-07 DIAGNOSIS — D649 Anemia, unspecified: Secondary | ICD-10-CM

## 2016-03-07 LAB — CBC
HCT: 29.2 % — ABNORMAL LOW (ref 39.0–52.0)
HEMOGLOBIN: 8.8 g/dL — AB (ref 13.0–17.0)
MCH: 27.8 pg (ref 26.0–34.0)
MCHC: 30.1 g/dL (ref 30.0–36.0)
MCV: 92.1 fL (ref 78.0–100.0)
PLATELETS: 191 10*3/uL (ref 150–400)
RBC: 3.17 MIL/uL — AB (ref 4.22–5.81)
RDW: 19.5 % — ABNORMAL HIGH (ref 11.5–15.5)
WBC: 5.6 10*3/uL (ref 4.0–10.5)

## 2016-03-07 LAB — BASIC METABOLIC PANEL
ANION GAP: 12 (ref 5–15)
BUN: 41 mg/dL — ABNORMAL HIGH (ref 6–20)
CHLORIDE: 92 mmol/L — AB (ref 101–111)
CO2: 31 mmol/L (ref 22–32)
Calcium: 8.7 mg/dL — ABNORMAL LOW (ref 8.9–10.3)
Creatinine, Ser: 4.96 mg/dL — ABNORMAL HIGH (ref 0.61–1.24)
GFR calc non Af Amer: 13 mL/min — ABNORMAL LOW (ref >60)
GFR, EST AFRICAN AMERICAN: 15 mL/min — AB (ref >60)
Glucose, Bld: 105 mg/dL — ABNORMAL HIGH (ref 65–99)
POTASSIUM: 3.6 mmol/L (ref 3.5–5.1)
SODIUM: 135 mmol/L (ref 135–145)

## 2016-03-07 LAB — GLUCOSE, CAPILLARY
GLUCOSE-CAPILLARY: 112 mg/dL — AB (ref 65–99)
GLUCOSE-CAPILLARY: 101 mg/dL — AB (ref 65–99)
GLUCOSE-CAPILLARY: 140 mg/dL — AB (ref 65–99)
Glucose-Capillary: 114 mg/dL — ABNORMAL HIGH (ref 65–99)
Glucose-Capillary: 73 mg/dL (ref 65–99)

## 2016-03-07 LAB — RENAL FUNCTION PANEL
ALBUMIN: 2.1 g/dL — AB (ref 3.5–5.0)
Anion gap: 12 (ref 5–15)
BUN: 42 mg/dL — ABNORMAL HIGH (ref 6–20)
CALCIUM: 8.6 mg/dL — AB (ref 8.9–10.3)
CHLORIDE: 92 mmol/L — AB (ref 101–111)
CO2: 31 mmol/L (ref 22–32)
CREATININE: 4.94 mg/dL — AB (ref 0.61–1.24)
GFR, EST AFRICAN AMERICAN: 15 mL/min — AB (ref >60)
GFR, EST NON AFRICAN AMERICAN: 13 mL/min — AB (ref >60)
Glucose, Bld: 101 mg/dL — ABNORMAL HIGH (ref 65–99)
Phosphorus: 4.9 mg/dL — ABNORMAL HIGH (ref 2.5–4.6)
Potassium: 3.7 mmol/L (ref 3.5–5.1)
SODIUM: 135 mmol/L (ref 135–145)

## 2016-03-07 LAB — MAGNESIUM: MAGNESIUM: 2.4 mg/dL (ref 1.7–2.4)

## 2016-03-07 MED ORDER — ASPIRIN 81 MG PO CHEW
81.0000 mg | CHEWABLE_TABLET | ORAL | Status: AC
  Administered 2016-03-08: 81 mg via ORAL
  Filled 2016-03-07: qty 1

## 2016-03-07 MED ORDER — ENSURE ENLIVE PO LIQD
237.0000 mL | Freq: Three times a day (TID) | ORAL | Status: DC
  Administered 2016-03-07 – 2016-03-12 (×7): 237 mL via ORAL

## 2016-03-07 MED ORDER — DIPHENHYDRAMINE HCL 12.5 MG/5ML PO ELIX
12.5000 mg | ORAL_SOLUTION | Freq: Three times a day (TID) | ORAL | Status: DC | PRN
  Administered 2016-03-09 – 2016-03-10 (×2): 12.5 mg via ORAL
  Filled 2016-03-07 (×5): qty 5

## 2016-03-07 MED ORDER — NEPRO/CARBSTEADY PO LIQD
237.0000 mL | Freq: Two times a day (BID) | ORAL | Status: DC
  Administered 2016-03-08: 237 mL via ORAL

## 2016-03-07 MED ORDER — ALBUTEROL SULFATE (2.5 MG/3ML) 0.083% IN NEBU: 2.5000 mg | INHALATION_SOLUTION | RESPIRATORY_TRACT | Status: DC | PRN

## 2016-03-07 MED ORDER — SODIUM CHLORIDE 0.9 % IV SOLN
INTRAVENOUS | Status: AC
  Administered 2016-03-08: 06:00:00 via INTRAVENOUS

## 2016-03-07 MED ORDER — DARBEPOETIN ALFA 200 MCG/0.4ML IJ SOSY
PREFILLED_SYRINGE | INTRAMUSCULAR | Status: AC
  Administered 2016-03-07: 200 ug via INTRAVENOUS
  Filled 2016-03-07: qty 0.4

## 2016-03-07 MED ORDER — SODIUM CHLORIDE 0.9 % IV SOLN: 250.0000 mL | INTRAVENOUS | Status: DC | PRN

## 2016-03-07 MED ORDER — IPRATROPIUM-ALBUTEROL 0.5-2.5 (3) MG/3ML IN SOLN
3.0000 mL | Freq: Three times a day (TID) | RESPIRATORY_TRACT | Status: DC
  Administered 2016-03-07 – 2016-03-09 (×5): 3 mL via RESPIRATORY_TRACT
  Filled 2016-03-07 (×4): qty 3

## 2016-03-07 MED ORDER — MORPHINE SULFATE (PF) 2 MG/ML IV SOLN
INTRAVENOUS | Status: AC
  Filled 2016-03-07: qty 1

## 2016-03-07 NOTE — Progress Notes (Addendum)
Speech Language Pathology Treatment: [place] Speaking valve  Patient Details Name: Nivedh Beitz MRN: [id number] DOB: 14-Jun-1976 Today's Date: 03/07/2016 Time: 9381-0175 SLP Time Calculation (min) (ACUTE ONLY): 8 min  Assessment / Plan / Recommendation Clinical Impression  Pt seen for PMV tolerance while sitting EOB, taking rest break during OT visit. Pt with concerns that PMV is not working unless he applies pressure to it. PMV removed from trach hub with air still leaking around trach with use of digital occlusion. One-way valve on PMV noted to open and close appropriately. It certainly appears to be air leaking around his trach rather than a malfunctioning speaking valve, particularly since secretions appear to be oozing from around the trach hub as well. Pt is able to increase his volume and subsequently his intelligibility with use of additional pressure. Reiterated results of MBS from previous date, including need for chin tuck and rationale. Will f/u on next date to better assess diet tolerance.   HPI HPI: 40 year old male admitted 02/08/2016 due to recurrent sepsis, failure to wean, multiple intubations. PMH significant forDM2, HTN, HLD, ESRD on HD, COPD. Trach placed 02/20/16.       SLP Plan  Continue with current plan of care     Recommendations  Diet recommendations: Dysphagia 2 (choppedt);Nectar-thick liquid Liquids provided via: Cup Medication Administration: Crushed with puree Supervision: Patient able to self feed;Full supervision/cueing for compensatory strategies Compensations: Slow rate;Small sips/bites;Multiple dry swallows after each bite/sip;Chin tuck Postural Changes and/or Swallow Maneuvers: Seated upright 90 degrees;Upright 30-60 min after meal;Chin tuck      Patient may use Passy-Muir Speech Valve: During all waking hours (remove during sleep) PMSV Supervision: Intermittent MD: Please consider changing trach tube to : Smaller size      Oral Care Recommendations:  Oral care BID Follow up Recommendations: Skilled Nursing facility Plan: Continue with current plan of care     GO                Maxcine Ham 03/07/2016, 3:43 PM  Maxcine Ham, M.A. CCC-SLP [phone]

## 2016-03-07 NOTE — Progress Notes (Signed)
OT Cancellation Note  Patient Details Name: Tanner Campbell MRN: [id number] DOB: 15-May-1976   Cancelled Treatment:    Reason Eval/Treat Not Completed: Patient at procedure or test/ unavailable.  Pt currently in HD.  Will reattempt.  Cillian Gwinner [place], OTR/L [phone]   [name] 03/07/2016, 10:51 AM

## 2016-03-07 NOTE — Progress Notes (Signed)
Nutrition Follow-up  DOCUMENTATION CODES:   Severe malnutrition in context of acute illness/injury  INTERVENTION:    Nepro Shake po BID, each supplement provides 425 kcal and 19 grams protein  NUTRITION DIAGNOSIS:   Increased nutrient needs related to chronic illness, wound healing as evidenced by estimated needs, ongoing  GOAL:   Patient will meet greater than or equal to 90% of their needs, met  MONITOR:   PO intake, Supplement acceptance, Labs, Weight trends, Skin, I & O's  ASSESSMENT:   40 year old esrd admitted initially to [facility] from [facility] d/t PNA and failure to wean. Self extubated 7/5. Clinically deteriorated over the following days. Required re-intubation 7/10 w/ findings of new HCAP and bacteremia. Transferred to [facility] on 7/14 with concerns for sepsis, NSTEMI, GIB, and endocarditis.   Pt transferred from 56M-MICU to 2C-Stepdown 7/22. Continues on trach collar. TF discontinued via [place] feeding tube. S/p MBSS 8/2 >> SLP recommending Dys 2, nectar thick liquids. PO intake 100% per flowsheet records. Would benefit from oral nutrition supplements to promote healing & recovery.  Diet Order:  DIET DYS 2 Room service appropriate? Yes; Fluid consistency: Nectar Thick  Skin:  Wound (see comment) (stage II pressure ulcer on sacrum)  Last BM:  8/2  Height:   Ht Readings from Last 1 Encounters:  02/06/2016 5' 5"  (1.651 m)    Weight:   Wt Readings from Last 1 Encounters:  03/07/16 124 lb (56.2 kg)    Ideal Body Weight:  59.1 kg  BMI:  Body mass index is 20.63 kg/m.  Estimated Nutritional Needs:   Kcal:  1700-1900  Protein:  85-100 gm  Fluid:  1.7-1.9 L  EDUCATION NEEDS:   No education needs identified at this time  Arthur Holms, RD, LDN Pager #: [phone] After-Hours Pager #: [phone]

## 2016-03-07 NOTE — Progress Notes (Signed)
Patient Name: Tanner Campbell Date of Encounter: 03/07/2016  Hospital Problem List     Principal Problem:   Acute respiratory failure with hypoxia [facility]) Active Problems:   Sepsis ([place])   ESRD (end stage renal disease) ([place])   Protein-calorie malnutrition, severe   Acute on chronic systolic CHF (congestive heart failure), NYHA class 3 (H[facility])   NSTEMI (non-ST elevated myocardial infarction) ([place])   Bacteremia   Tracheostomy status (H[facility])   End-stage renal disease on hemodialysis ([place])   Intestinal infection due to veillonella   Pericardial effusion   Anemia in chronic kidney disease   Acute blood loss anemia   Severe malnutrition ([facility])   HCAP (healthcare-associated pneumonia)   Aortic regurgitation   Dysphagia   Chronic back pain   Acute respiratory failure ([place])   Encounter for feeding tube placement   COPD exacerbation ([place])    Subjective   Reports no angina this morning. Recently suctioned by nursing staff, and coughing some.   Inpatient Medications    . antiseptic oral rinse  7 mL Mouth Rinse QID  . aspirin  81 mg Oral Daily  . atorvastatin  80 mg Oral q1800  . chlorhexidine gluconate (SAGE KIT)  15 mL Mouth Rinse BID  . darbepoetin (ARANESP) injection - DIALYSIS  200 mcg Intravenous Q Thu-HD  . ferric gluconate (FERRLECIT/NULECIT) IV  125 mg Intravenous Q T,Th,Sa-HD  . gabapentin  300 mg Oral Q12H  . heparin subcutaneous  5,000 Units Subcutaneous Q8H  . insulin aspart  0-9 Units Subcutaneous Q4H  . ipratropium-albuterol  3 mL Nebulization QID  . isosorbide dinitrate  10 mg Oral TID  . metoprolol tartrate  12.5 mg Oral BID  . morphine      . multivitamin  1 tablet Oral QHS  . pantoprazole sodium  40 mg Oral Daily    Vital Signs    Vitals:   03/07/16 1154 03/07/16 1218 03/07/16 1255 03/07/16 1308  BP: 124/63 (!) 143/67 (!) 147/82 (!) 150/73  Pulse: 82 86  85  Resp: _0 Temp:  97.2 F (36.2 C) 98.1 F (36.7 C)   TempSrc:  Oral Other (Comment)     SpO2: 100% 100% 96% 100%  Weight: 124 lb (56.2 kg)     Height:        Intake/Output Summary (Last 24 hours) at 03/07/16 1409 Last data filed at 03/07/16 1154  Gross per 24 hour  Intake                0 ml  Output              685 ml  Net             -685 ml   Filed Weights   03/07/16 0500 03/07/16 0820 03/07/16 1154  Weight: 126 lb (57.2 kg) 124 lb 1.9 oz (56.3 kg) 124 lb (56.2 kg)    Physical Exam    General: ill-appearing, frail male, NAD. Neuro: Alert and oriented X 3. Moves all extremities spontaneously. Psych: Normal affect. HEENT:  Normal, trach in place  Neck: Supple without bruits or JVD. Lungs:  Resp regular and unlabored, Diminished in lower lobes. Heart: RRR no s3, s4, 2/6 systolic murmur. Abdomen: Soft, non-tender, non-distended, BS + x 4.  Extremities: No clubbing, cyanosis or edema. DP/PT/Radials 2+ and equal bilaterally.  Labs    CBC  Recent Labs  03/05/16 0334 03/07/16 0845  WBC 6.2 5.6  HGB 10.2* 8.8*  HCT 34.9* 29.2*  MCV 94.1 92.1  PLT 217 606   Basic Metabolic Panel  Recent Labs  03/05/16 0644 03/07/16 0834  NA 132* 135  K 4.1 3.6  CL 91* 92*  CO2 29 31  GLUCOSE 72 105*  BUN 47* 41*  CREATININE 3.98* 4.96*  CALCIUM 8.5* 8.7*  MG  --  2.4  PHOS 4.6  --    Liver Function Tests  Recent Labs  03/05/16 0644  ALBUMIN 1.9*   No results for input(s): LIPASE, AMYLASE in the last 72 hours. Cardiac Enzymes  Recent Labs  03/04/16 1615 03/04/16 2131 03/05/16 0334  TROPONINI 0.06* 0.05* 0.07*   BNP Invalid input(s): POCBNP D-Dimer No results for input(s): DDIMER in the last 72 hours. Hemoglobin A1C No results for input(s): HGBA1C in the last 72 hours. Fasting Lipid Panel No results for input(s): CHOL, HDL, LDLCALC, TRIG, CHOLHDL, LDLDIRECT in the last 72 hours. Thyroid Function Tests No results for input(s): TSH, T4TOTAL, T3FREE, THYROIDAB in the last 72 hours.  Invalid input(s): FREET3  Telemetry    SR  ECG     None this morning  Radiology    Nm Myocar Multi W/spect W/wall Motion / Ef  Result Date: 03/06/2016  There was no ST segment deviation noted during stress.  Defect 1: There is a medium defect of severe severity present in the basal inferior and mid inferior location.  Findings consistent with prior myocardial infarction.  This is an intermediate risk study.  The left ventricular ejection fraction is moderately decreased (30-44%).  Global hypokinesis worse in the inferior and inferoseptal regions.     Assessment & Plan    Pt Profile: Tanner Campbell is 40 yo male with PMH of HTN, HLD, DM, Multiple sclerosis, COPD and ESRD on HD since 05/02/2014 presented with acute resp distress, failed multiple extubation attempt. Acute respiratory failure felt to be due to a combination of Haemophilus pneumonia and volume overload. Patient has missed at least 3 episodes of hemodialysis before presentation, nephrology managing hemodialysis.Found to have mildly elevated trop on 7/7-7/8.  Cardiology consulted 7/10 for elevated troponin and EKG changes. An echocardiogram 02/07/16 a mildly reduced LVEF of 45-50% however there is akinesis of the mid inferior and inferoseptal myocardium, mild aortic insufficiency and a small pericardial effusion. A repeat EKG 7/13 showed worsening deep T-wave inversions in the antero-lateral leads with high voltage as well as inferior T-wave inversions. Advice to avoid ASA in setting of acute GI bleed.   Echo done 02/15/16 showed slightly worse LV function with focal inferior wall motion abnormality, which could suggest severe ischemia or infarct in this territory. There is a small pericardial effusion which is stable. Resumed ASA. He experienced acute encephalopathy on 03/01/2016 and cardiac work up was deferred until the patient was more stable.   He underwent a Lexiscan myoview on 03/06/2016 which resulted as intermediate risk with a medium defect of severe severity present in the basal  inferior and mid inferior location. EF noted at 30-44%.   1. NSTEMI: Troponin mildly elevated which could be demand ischemia but patient has had some CP in the hospital.  Nuclear stress test with reversible defect in the inferior and inferoseptal region corresponding to wall motion abnormality seen on echo.  He is very debilitated and increased cough.  Would prefer for respiratory status to be more stable prior to cath.  Also, his Hbg is low and this needs to be addressed by GI prior to cath.  If he needs stent he will have to be  on DAPT which would increase risk of recurrent GIB.  Please get GI consult (it was recommended earlier in his admission to avoid ASA).  Once felt to be stable will plan left heart cath.  This has been discussed with patient who agrees with plan. 2.  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 EF 45-50% by echo and 30-44% by nuclear stress test.  3.  ESRD on HD per nephrology    Signed, Fransico Him, MD [facility] 03/07/2016

## 2016-03-07 NOTE — Progress Notes (Signed)
3295 called by transport to patient in elevator for patient complaint of chest pain. Patient moved back to room, vitals stable. 1 mg of morphine given. Patient indicated pain level decreased. Report given to HD and patient transferred to HD.

## 2016-03-07 NOTE — Progress Notes (Signed)
Hemodialysis- Pt signed of AMA with 47 minutes remaining d/t being uncomfortable in bed and not being able to move access arm. Discussed the importance of completing treatments with patient. Total UF approx 700cc d/t ending early. BP improved with post 124/63, HR 82. Pt repositioned in bed. Report called to primary RN.

## 2016-03-07 NOTE — Progress Notes (Signed)
PROGRESS NOTE    Jere Bostrom  LKG:[id number] DOB: 03/03/1976 DOA: 02/17/2016 PCP: No primary care provider on file.   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  He decompensated 6/23 and required intubation. The patient was found to have H. Influenza positive sputum and treated with Rocephin. CXR demonstrated a large right pleural effusion and he underwent a thoracentesis (1L serous fluid removed - exudative by protein). He had difficulty with weaning and was transferred to [facility] on 6/29 orally intubated for ventilator weaning. The patient developed fever and antibiotics were expanded to vancomycin, cefepime and diflucan. He had progressed to weaning on PSV x 4 hours as of 7/3. He self extubated on 7/5. Since that time he required BiPAP off and on. After HD 7/10 his mental status worsened and he became minimally responsive. CXR was consistent with RLL opacification and he was re-intubated. He was re-cultured and also found to have increased Troponin for which Cardiology was consulted. He was placed on LMWH, but had a GIB while on this w/ his hgb dropping as low as 8.4.  On 7/14 he remained ventilator dependent. His sputum cultures grew MSSA and E coli and he was noted to have + blood cultures. Because of these acutely worsening issues it was felt best to transfer him to the ICU at [facility].   Subjective: 8/3 A/O 4, able to speak with Passy-Muir valve in place. Was able to stand and take a couple steps with help. CoreTrak tube was pulled out accidentally last night.        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 Clinically patient improved will hold on starting any antibiotics. If patient develops fever/bands/left shift will consider restarting. -Resolved  Tracheostomy -Counseled placed consult respiratory therapy continue to attempt to progress patient daily toward decannulation  Bacteremia with positive Veillonella species (Gram negative rod) -Completed Flagyl x 2 weeks for bacteremia as per ID recs   Leukocytosis -Resolved: Some organisms and trach aspirate but without any additional signs of infection would not treat.  Bilateral pleural effusions s/p thoracentesis -See acute hypoxic respiratory failure  NSTEMI vs demand ischemia -Cardiology suggest he will need an eventual cardiac cath  -7/27 spoke with Dr. Lyman Bishop cardiology and we agreed would perform cardiac cath early next week. Continue to maximize respiratory status -Strict in and out since admission +  3.8 L -Daily weight Filed Weights   03/05/16 1100 03/06/16 0355 03/07/16 0500  Weight: 56.2 kg (124 lb) 57.2 kg (126 lb) 57.2 kg (126 lb)  -Manage fluids per HD;See pleural effusion  -Transfuse for hemoglobin<8 Recent Labs Lab 03/02/16 0351 03/03/16 1306 03/04/16 0343 03/05/16 0334  03/07/16 0845  HGB 10.7* 10.0* 10.1* 10.2* 8.8*  -8/3 Cardiology requests clearance by GI before they will perform cardiac catheterization. Have consulted  [facility]   Small pericardial effusion with friction rub: Pericarditis? -Cardiology following  - ANA negative  - TEE without evidence of endocarditis/vegetations  - 7/27 EKG: Not consistent with pericarditis. - ESR, CRP significantly elevated. Nonspecific considering patient's multiple other medical problems to include renal failure. Will hold on increasing aspirin at this time.  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, Th, S) -Nephrology following   Recent GIB on LMWH -No evidence of acute blood loss a this time,  Anemia of chronic kidney disease + acute blood loss  No evidence of blood loss, but Hgb drifted down to 7.1  - 7/24 transfuse 1U PRBC  -7/27 transfused 1 unit PRBC  DM type II uncontrolled with complications  -9/62 Hemoglobin A1c= 7.7 -Sensitive SSI  Acute Encephalopathy in setting of critical illness -Resolved  Severe malnutrition in context of acute illness -See dysphagia -Ensure TID  Dysphagia -8/3 patient passed swallow test now on dysphagia 3 nectar thick liquid  Chronic back pain -Morphine -Neurontin BID -Air mattress, ensure on rotation at all times    Goals of care -PT recommended CIR  - CIR felt he is more appropriate for LTACH - sister does not want him to go back to [facility] because she didn't feel he was being treated well - plan is for SNF placement, but will have to be decannulated as he is ESRD and requires HD and there are no area SNFs that accept ESRD AND trach patients   -Consult psychiatry on 7/29 to ensure patient has competency. Patient's family unrealistic in their expectations therefore would like patient to make decisions if deemed competent -7/30 meeting with patient/sister/brother-in-law explain plan of care for next week 1. attempted to Decannulate if  possible, 2. Cardiac catheterization 3. Reevaluate swallow.     DVT prophylaxis: Subcutaneous heparin Code Status: Full Family Communication: None Disposition Plan: Vent SNF   Consultants:  PCCM Dr. Lyman Bishop Cardiology  [facility] Nephrology  [facility]Su [facility]   Procedures/Significant Events:  7/21 TEE;- No evidence of endocarditis.-Negative vegetation- Aortic insufficiency is seen, with a central jet. It   is likely due to degenerative changes.   7/24 transfuse 1U PRBC 7/27 PCXR: Cardiomegaly with bilateral from interstitial prominence and bilateral effusions C/W CHF, PNA cannot be excluded.- basilar atelectasis  7/27 transfused 1 unit PRBC 8/2 nuclear medicine myocardial perfusion scan:-no ST segment deviation noted during stress. -Defect 1: medium defect of severe severity present in the basal inferior and mid inferior location.C/W  with prior myocardial infarction.-LVEF=(30-44%).,Global hypokinesis worse in the inferior and inferoseptal regions. -Reversible defect in the inferior and inferoseptal region corresponding to wall motion abnormality seen on echo   Cultures 7/14 MRSA by PCR negative 7/18 blood right hand x2 pending 7/30 blood right hand NGTD 7/30 blood right arm positive coag negative staph(most likely contaminant) 7/30 trach aspirate positive multiple organisms none predominant   Antimicrobials: Cipro 7/12>7/14 Vanc 7/2>7/17 Cefepime 7/14>7/21 Fluconazole 7/7>7/17 Flagyl 7/17 > 8/1   Devices 7/25 uncuffed #6 Shiley>>   LINES / TUBES:  7/29 CorTrak  tube placed    Continuous Infusions:         Objective: Vitals:   03/07/16 0100 03/07/16 0348 03/07/16 0350 03/07/16 0500  BP: (!) 98/55  (!) 150/63   Pulse:   87   Resp: 11 17 15    Temp:  98.2 F (36.8 C)    TempSrc:  Oral    SpO2:   97%   Weight:    57.2 kg (126 lb)  Height:        Intake/Output Summary (Last 24 hours) at 03/07/16 6060 Last data filed at 03/06/16 1553  Gross per 24  hour  Intake                0 ml  Output                1 ml  Net               -1 ml   Filed Weights   03/05/16 1100 03/06/16 0355 03/07/16 0500  Weight: 56.2 kg (124 lb) 57.2 kg (126 lb) 57.2 kg (126 lb)    Examination:  General: A/O 4 , follows commands, positive acute respiratory distress (improved) Eyes: negative scleral hemorrhage, negative anisocoria, negative icterus ENT: Negative Runny nose, negative gingival bleeding, Neck:  Negative scars, masses, torticollis, lymphadenopathy, JVD Lungs: mild diffuse rhonchi (improved ) Lt >>Rt, negative wheezes or crackles Cardiovascular:  Regular rhythm and rate without murmur gallop or rub normal S1 and S2 Abdomen: negative abdominal pain, nondistended, positive soft, bowel sounds, no rebound, no ascites, no appreciable mass Extremities: No significant cyanosis, clubbing, or edema bilateral lower extremities Skin: Negative rashes, lesions, ulcers Psychiatric:  Unable to evaluate  Central nervous system:  Cranial nerves II through XII intact, follows commands moves extremities   .     Data Reviewed: Care during the described time interval was provided by me .  I have reviewed this patient's available data, including medical history, events of note, physical examination, and all test results as part of my evaluation. I have personally reviewed and interpreted all radiology studies.  CBC:  Recent Labs Lab 03/01/16 0223 03/02/16 0351 03/03/16 1306 03/04/16 0343 03/05/16 0334  WBC 11.0* 13.2* 7.0 7.7 6.2  NEUTROABS 9.1*  --  5.6 5.1  --   HGB 10.3* 10.7* 10.0* 10.1* 10.2*  HCT 33.2* 34.5* 32.7* 34.2* 34.9*  MCV 89.2 90.6 92.9 93.2 94.1  PLT 160 177 213 221 045   Basic Metabolic Panel:  Recent Labs Lab 03/01/16 0223 03/02/16 0351 03/03/16 1306 03/04/16 0343 03/05/16 0644  NA 136 133* 134* 135 132*  K 3.5 4.0 3.3* 4.0 4.1  CL 96* 94* 91* 93* 91*  CO2 30 26 31  32 29  GLUCOSE 110* 93 147* 127* 72  BUN 16 26* 20 29*  47*  CREATININE 1.83* 2.97* 2.62* 3.11* 3.98*  CALCIUM 8.2* 8.4* 8.3* 8.6* 8.5*  MG 1.9 2.0 2.1 2.3  --   PHOS  --   --   --   --  4.6   GFR: Estimated Creatinine Clearance: 19.8 mL/min (by C-G formula based on SCr of 3.98 mg/dL). Liver Function Tests:  Recent Labs Lab 03/05/16 0644  ALBUMIN 1.9*   No results for input(s): LIPASE, AMYLASE in the last 168 hours. No results for input(s): AMMONIA in the last 168 hours. Coagulation Profile: No results for input(s): INR, PROTIME in the last 168 hours. Cardiac Enzymes:  Recent Labs Lab 03/02/16 2100 03/03/16 9977 03/04/16 1615 03/04/16 2131  03/05/16 0334  TROPONINI 0.07* 0.07*  0.07* 0.06* 0.05* 0.07*   BNP (last 3 results) No results for input(s): PROBNP in the last 8760 hours. HbA1C: No results for input(s): HGBA1C in the last 72 hours. CBG:  Recent Labs Lab 03/06/16 0815 03/06/16 1713 03/06/16 1942 03/06/16 2331 03/07/16 0347  GLUCAP 86 95 142* 173* 114*   Lipid Profile: No results for input(s): CHOL, HDL, LDLCALC, TRIG, CHOLHDL, LDLDIRECT in the last 72 hours. Thyroid Function Tests: No results for input(s): TSH, T4TOTAL, FREET4, T3FREE, THYROIDAB in the last 72 hours. Anemia Panel: No results for input(s): VITAMINB12, FOLATE, FERRITIN, TIBC, IRON, RETICCTPCT in the last 72 hours. Urine analysis:    Component Value Date/Time   COLORURINE RED (A) 02/04/2016 1426   APPEARANCEUR CLOUDY (A) 02/04/2016 1426   LABSPEC 1.025 02/04/2016 1426   PHURINE 7.5 02/04/2016 1426   GLUCOSEU 500 (A) 02/04/2016 1426   HGBUR SMALL (A) 02/04/2016 1426   BILIRUBINUR SMALL (A) 02/04/2016 1426   KETONESUR NEGATIVE 02/04/2016 1426   PROTEINUR >300 (A) 02/04/2016 1426   NITRITE NEGATIVE 02/04/2016 1426   LEUKOCYTESUR TRACE (A) 02/04/2016 1426   Sepsis Labs: @LABRCNTIP (procalcitonin:4,lacticidven:4)  ) Recent Results (from the past 240 hour(s))  Culture, blood (routine x 2)     Status: None (Preliminary result)    Collection Time: 03/03/16  1:11 PM  Result Value Ref Range Status   Specimen Description BLOOD BLOOD RIGHT HAND  Final   Special Requests IN PEDIATRIC BOTTLE 3CC  Final   Culture NO GROWTH 3 DAYS  Final   Report Status PENDING  Incomplete  Culture, blood (routine x 2)     Status: Abnormal   Collection Time: 03/03/16  1:16 PM  Result Value Ref Range Status   Specimen Description BLOOD BLOOD RIGHT ARM  Final   Special Requests IN PEDIATRIC BOTTLE 3CC  Final   Culture  Setup Time   Final    GRAM POSITIVE COCCI IN CLUSTERS AEROBIC BOTTLE ONLY CRITICAL RESULT CALLED TO, READ BACK BY AND VERIFIED WITH: M TURNER 03/04/16 @ 40 M VESTAL    Culture (A)  Final    STAPHYLOCOCCUS SPECIES (COAGULASE NEGATIVE) THE SIGNIFICANCE OF ISOLATING THIS ORGANISM FROM A SINGLE SET OF BLOOD CULTURES WHEN MULTIPLE SETS ARE DRAWN IS UNCERTAIN. PLEASE NOTIFY THE MICROBIOLOGY DEPARTMENT WITHIN ONE WEEK IF SPECIATION AND SENSITIVITIES ARE REQUIRED.    Report Status 03/06/2016 FINAL  Final  Blood Culture ID Panel (Reflexed)     Status: Abnormal   Collection Time: 03/03/16  1:16 PM  Result Value Ref Range Status   Enterococcus species NOT DETECTED NOT DETECTED Final   Vancomycin resistance NOT DETECTED NOT DETECTED Final   Listeria monocytogenes NOT DETECTED NOT DETECTED Final   Staphylococcus species DETECTED (A) NOT DETECTED Final    Comment: CRITICAL RESULT CALLED TO, READ BACK BY AND VERIFIED WITH: M TURNER 03/04/16 @ 48 M VESTAL    Staphylococcus aureus NOT DETECTED NOT DETECTED Final   Methicillin resistance NOT DETECTED NOT DETECTED Final   Streptococcus species NOT DETECTED NOT DETECTED Final   Streptococcus agalactiae NOT DETECTED NOT DETECTED Final   Streptococcus pneumoniae NOT DETECTED NOT DETECTED Final   Streptococcus pyogenes NOT DETECTED NOT DETECTED Final   Acinetobacter baumannii NOT DETECTED NOT DETECTED Final   Enterobacteriaceae species NOT DETECTED NOT DETECTED Final   Enterobacter  cloacae complex NOT DETECTED NOT DETECTED Final   Escherichia coli NOT DETECTED NOT DETECTED Final   Klebsiella oxytoca NOT DETECTED NOT DETECTED Final  Klebsiella pneumoniae NOT DETECTED NOT DETECTED Final   Proteus species NOT DETECTED NOT DETECTED Final   Serratia marcescens NOT DETECTED NOT DETECTED Final   Carbapenem resistance NOT DETECTED NOT DETECTED Final   Haemophilus influenzae NOT DETECTED NOT DETECTED Final   Neisseria meningitidis NOT DETECTED NOT DETECTED Final   Pseudomonas aeruginosa NOT DETECTED NOT DETECTED Final   Candida albicans NOT DETECTED NOT DETECTED Final   Candida glabrata NOT DETECTED NOT DETECTED Final   Candida krusei NOT DETECTED NOT DETECTED Final   Candida parapsilosis NOT DETECTED NOT DETECTED Final   Candida tropicalis NOT DETECTED NOT DETECTED Final  Culture, respiratory (NON-Expectorated)     Status: None   Collection Time: 03/03/16  1:19 PM  Result Value Ref Range Status   Specimen Description TRACHEAL ASPIRATE  Final   Special Requests NONE  Final   Gram Stain   Final    ABUNDANT WBC PRESENT, PREDOMINANTLY PMN RARE SQUAMOUS EPITHELIAL CELLS PRESENT FEW GRAM NEGATIVE COCCI IN PAIRS RARE GRAM NEGATIVE RODS RARE GRAM POSITIVE COCCI IN PAIRS    Culture MULTIPLE ORGANISMS PRESENT, NONE PREDOMINANT  Final   Report Status 03/05/2016 FINAL  Final         Radiology Studies: Nm Myocar Multi W/spect W/wall Motion / Ef  Result Date: 03/06/2016  There was no ST segment deviation noted during stress.  Defect 1: There is a medium defect of severe severity present in the basal inferior and mid inferior location.  Findings consistent with prior myocardial infarction.  This is an intermediate risk study.  The left ventricular ejection fraction is moderately decreased (30-44%).  Global hypokinesis worse in the inferior and inferoseptal regions.    Dg Swallowing Func-speech Pathology  Result Date: 03/06/2016 Objective Swallowing Evaluation: Type of  Study: MBS-Modified Barium Swallow Study Patient Details Name: Bob Daversa MRN: [id number] Date of Birth: 1975/09/05 Today's Date: 03/06/2016 Time: SLP Start Time (ACUTE ONLY): 1358-SLP Stop Time (ACUTE ONLY): 1417 SLP Time Calculation (min) (ACUTE ONLY): 19 min Past Medical History: Past Medical History: Diagnosis Date . COPD (chronic obstructive pulmonary disease) ([place])  . Diabetes mellitus without complication ([place])  . Hemodialysis patient ([place])  . High cholesterol  . Hypertension  . Neuropathy ([place])  . Renal disorder  Past Surgical History: Past Surgical History: Procedure Laterality Date . BACK SURGERY   . CHOLECYSTECTOMY   . NECK SURGERY   . TEE WITHOUT CARDIOVERSION N/A 02/15/2016  Procedure: TRANSESOPHAGEAL ECHOCARDIOGRAM (TEE);  Surgeon: Sanda Klein, MD;  Location: [facility];  Service: Cardiovascular;  Laterality: N/A; . TRACHEOSTOMY TUBE PLACEMENT N/A 02/29/2016  Procedure: TRACHEOSTOMY;  Surgeon: Leta Baptist, MD;  Location: [facility] OR;  Service: ENT;  Laterality: N/A; HPI: 40 year old male admitted 02/06/2016 due to recurrent sepsis, failure to wean, multiple intubations. PMH significant forDM2, HTN, HLD, ESRD on HD, COPD. Trach placed 02/11/2016.  Subjective: pt alert, eager to eat/drink Assessment / Plan / Recommendation CHL IP CLINICAL IMPRESSIONS 03/06/2016 Therapy Diagnosis Moderate pharyngeal phase dysphagia Clinical Impression Pt has a moderate pharyngeal dysphagia with sensorimotor deficits. Oral phase is generally with functional limits, but pharyngeal trigger is delayed. This results in deep, silent penetration before the swallow with thin liquids. Silent aspiration occurs with nectar thick liquids after the swallow secondary to residue remaining in the valleculae and pyriform sinuses, which he attempts to spontaneously clear with subswallows. Volitional cough/throat clearing is weak despite use of PMV and SLP cueing for increased force. Use of a chin tuck is effective at reducing pharyngeal residuals,  therefore  increasing airway protection. Recommend Dys 2 diet and nectar thick liquids with strict use of chin tuck. Pt should also wear PMV during all intake. Will continue to follow for tolerance and readiness to advance. Impact on safety and function Mild aspiration risk;Moderate aspiration risk   CHL IP TREATMENT RECOMMENDATION 03/06/2016 Treatment Recommendations Therapy as outlined in treatment plan below   Prognosis 03/06/2016 Prognosis for Safe Diet Advancement Good Barriers to Reach Goals -- Barriers/Prognosis Comment -- CHL IP DIET RECOMMENDATION 03/06/2016 SLP Diet Recommendations Dysphagia 2 (Fine chop) solids;Nectar thick liquid Liquid Administration via Cup;No straw Medication Administration Crushed with puree Compensations Slow rate;Small sips/bites;Multiple dry swallows after each bite/sip;Chin tuck Postural Changes Remain semi-upright after after feeds/meals (Comment);Seated upright at 90 degrees   CHL IP OTHER RECOMMENDATIONS 03/06/2016 Recommended Consults -- Oral Care Recommendations Oral care BID Other Recommendations Order thickener from pharmacy;Prohibited food (jello, ice cream, thin soups);Remove water pitcher;Place PMSV during PO intake   CHL IP FOLLOW UP RECOMMENDATIONS 03/06/2016 Follow up Recommendations Skilled Nursing facility   [facility] IP FREQUENCY AND DURATION 03/06/2016 Speech Therapy Frequency (ACUTE ONLY) min 2x/week Treatment Duration 2 weeks      CHL IP ORAL PHASE 03/06/2016 Oral Phase WFL Oral - Pudding Teaspoon -- Oral - Pudding Cup -- Oral - Honey Teaspoon -- Oral - Honey Cup -- Oral - Nectar Teaspoon -- Oral - Nectar Cup -- Oral - Nectar Straw -- Oral - Thin Teaspoon -- Oral - Thin Cup -- Oral - Thin Straw -- Oral - Puree -- Oral - Mech Soft -- Oral - Regular -- Oral - Multi-Consistency -- Oral - Pill -- Oral Phase - Comment --  CHL IP PHARYNGEAL PHASE 03/06/2016 Pharyngeal Phase Impaired Pharyngeal- Pudding Teaspoon -- Pharyngeal -- Pharyngeal- Pudding Cup -- Pharyngeal -- Pharyngeal- Honey  Teaspoon Delayed swallow initiation-vallecula;Reduced epiglottic inversion;Reduced anterior laryngeal mobility;Reduced laryngeal elevation;Reduced airway/laryngeal closure;Reduced tongue base retraction;Pharyngeal residue - valleculae;Pharyngeal residue - pyriform Pharyngeal -- Pharyngeal- Honey Cup Delayed swallow initiation-vallecula;Reduced epiglottic inversion;Reduced anterior laryngeal mobility;Reduced laryngeal elevation;Reduced airway/laryngeal closure;Reduced tongue base retraction;Pharyngeal residue - valleculae;Pharyngeal residue - pyriform;Compensatory strategies attempted (with notebox) Pharyngeal -- Pharyngeal- Nectar Teaspoon Delayed swallow initiation-vallecula;Reduced epiglottic inversion;Reduced anterior laryngeal mobility;Reduced laryngeal elevation;Reduced airway/laryngeal closure;Reduced tongue base retraction;Pharyngeal residue - valleculae;Pharyngeal residue - pyriform Pharyngeal Material does not enter airway Pharyngeal- Nectar Cup Delayed swallow initiation-vallecula;Reduced epiglottic inversion;Reduced anterior laryngeal mobility;Reduced laryngeal elevation;Reduced airway/laryngeal closure;Reduced tongue base retraction;Pharyngeal residue - valleculae;Pharyngeal residue - pyriform;Penetration/Apiration after swallow;Compensatory strategies attempted (with notebox) Pharyngeal Material enters airway, passes BELOW cords without attempt by patient to eject out (silent aspiration) Pharyngeal- Nectar Straw -- Pharyngeal -- Pharyngeal- Thin Teaspoon NT Pharyngeal -- Pharyngeal- Thin Cup Reduced epiglottic inversion;Reduced anterior laryngeal mobility;Reduced laryngeal elevation;Reduced airway/laryngeal closure;Reduced tongue base retraction;Pharyngeal residue - valleculae;Pharyngeal residue - pyriform;Delayed swallow initiation-pyriform sinuses;Compensatory strategies attempted (with notebox) Pharyngeal -- Pharyngeal- Thin Straw Reduced epiglottic inversion;Reduced anterior laryngeal  mobility;Reduced laryngeal elevation;Reduced airway/laryngeal closure;Reduced tongue base retraction;Pharyngeal residue - valleculae;Pharyngeal residue - pyriform;Penetration/Aspiration before swallow;Delayed swallow initiation-pyriform sinuses;Compensatory strategies attempted (with notebox) Pharyngeal Material enters airway, CONTACTS cords and not ejected out Pharyngeal- Puree Delayed swallow initiation-vallecula;Reduced epiglottic inversion;Reduced anterior laryngeal mobility;Reduced laryngeal elevation;Reduced airway/laryngeal closure;Reduced tongue base retraction;Pharyngeal residue - valleculae;Pharyngeal residue - pyriform;Compensatory strategies attempted (with notebox) Pharyngeal -- Pharyngeal- Mechanical Soft Delayed swallow initiation-vallecula;Reduced epiglottic inversion;Reduced anterior laryngeal mobility;Reduced laryngeal elevation;Reduced airway/laryngeal closure;Reduced tongue base retraction;Pharyngeal residue - valleculae;Pharyngeal residue - pyriform;Compensatory strategies attempted (with notebox) Pharyngeal -- Pharyngeal- Regular -- Pharyngeal -- Pharyngeal- Multi-consistency -- Pharyngeal -- Pharyngeal- Pill -- Pharyngeal -- Pharyngeal Comment --  CHL  IP CERVICAL ESOPHAGEAL PHASE 03/06/2016 Cervical Esophageal Phase WFL Pudding Teaspoon -- Pudding Cup -- Honey Teaspoon -- Honey Cup -- Nectar Teaspoon -- Nectar Cup -- Nectar Straw -- Thin Teaspoon -- Thin Cup -- Thin Straw -- Puree -- Mechanical Soft -- Regular -- Multi-consistency -- Pill -- Cervical Esophageal Comment -- No flowsheet data found. Germain Osgood 03/06/2016, 2:36 PM  Germain Osgood, M.A. CCC-SLP [phone]                  Scheduled Meds: . antiseptic oral rinse  7 mL Mouth Rinse QID  . aspirin  81 mg Oral Daily  . atorvastatin  80 mg Oral q1800  . chlorhexidine gluconate (SAGE KIT)  15 mL Mouth Rinse BID  . darbepoetin (ARANESP) injection - DIALYSIS  200 mcg Intravenous Q Thu-HD  . ferric gluconate  (FERRLECIT/NULECIT) IV  125 mg Intravenous Q T,Th,Sa-HD  . gabapentin  300 mg Oral Q12H  . heparin subcutaneous  5,000 Units Subcutaneous Q8H  . insulin aspart  0-9 Units Subcutaneous Q4H  . ipratropium-albuterol  3 mL Nebulization QID  . isosorbide dinitrate  10 mg Oral TID  . metoprolol tartrate  12.5 mg Oral BID  . multivitamin  1 tablet Oral QHS  . pantoprazole sodium  40 mg Oral Daily   Continuous Infusions:     LOS: 20 days    Time spent: 40 minutes    Emmogene Simson, Geraldo Docker, MD [facility] Pager [phone]   If 7PM-7AM, please contact night-coverage [email or web address] Password [email or web address] 03/07/2016, 7:52 AM

## 2016-03-07 NOTE — Progress Notes (Signed)
Occupational Therapy Treatment Patient Details Name: Soctt Obrochta MRN: [id number] DOB: 11-18-1975 Today's Date: 03/07/2016    History of present illness Tanner Campbell is an 40 y.o. 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 He was intubated 6/23-7/5 (self extubated), 7/10-7/16 (self extubated), was then reintubated. Initially, sputum was positive for H influenza and subsequently for MSSA and Escherichia coli and blood cultures growing a gram-negative anaerobic organism. Thoracentesis on right showed borderline exudate by LDH criteria. [name] 7/18; passed swallow study 8/2; cardiolite stress test 8/2 with global hypokinesis EF 40% and prior infarct   OT comments  Pt fatigued due to HD, but very willing to work with OT.  Pt required mod A to move to EOB.  Attempted sit to stand x 4 - unable to fully achieve standing with max A.  He required long rest breaks between each attempt.  At end of session, pt with c/o severe headache with dizziness.  Pt returned to supine and RN made aware   Follow Up Recommendations  CIR;Supervision/Assistance - 24 hour    Equipment Recommendations  None recommended by OT    Recommendations for Other Services      Precautions / Restrictions Precautions Precautions: Fall Precaution Comments: [name]; Dys 2 with nectar thick liquids       Mobility Bed Mobility Overal bed mobility: Needs Assistance Bed Mobility: Supine to Sit;Sit to Supine     Supine to sit: Mod assist Sit to supine: Mod assist   General bed mobility comments: Pt requires assist to lift trunk from bed   Transfers Overall transfer level: Needs assistance   Transfers: Sit to/from Stand Sit to Stand: Max assist         General transfer comment: Pt fatigued after HD.  Using [facility], pt attempted to stand x 4.  He was able to lift  bottom from bed minimally, but unable to achieve full standing     Balance Overall balance assessment: Needs assistance Sitting-balance support: Feet supported Sitting balance-Leahy Scale: Fair         Standing balance comment: unable to achieve standing                    ADL                                                Vision                     Perception     Praxis      Cognition   Behavior During Therapy: [facility] for tasks assessed/performed Overall Cognitive Status: Impaired/Different from baseline Area of Impairment: Attention;Problem solving;Safety/judgement   Current Attention Level: Selective Memory: Decreased short-term memory  Following Commands: Follows one step commands consistently     Problem Solving: Slow processing;Decreased initiation;Difficulty sequencing;Requires verbal cues      Extremity/Trunk Assessment               Exercises     Shoulder Instructions       General Comments      Pertinent Vitals/ Pain       Pain Assessment: Faces Faces Pain Scale: Hurts whole lot Pain Location:  posterior headache Pain Descriptors / Indicators: Grimacing;Guarding;Restless;Sharp;Throbbing Pain Intervention(s): Repositioned;RN gave pain meds during session;Patient requesting pain meds-RN notified  Home Living                                          Prior Functioning/Environment              Frequency Min 2X/week     Progress Toward Goals  OT Goals(current goals can now be found in the care plan section)  Progress towards OT goals: Progressing toward goals  ADL Goals Pt Will Perform Grooming: with supervision;sitting Pt Will Perform Upper Body Bathing: with min assist;sitting Pt Will Transfer to Toilet: with mod assist;bedside commode;stand pivot transfer Additional ADL Goal #1: Pt will perform sit to/from stands consistently with min assist as a precursor for ADLs  Additional  ADL Goal #2: Pt will engage in bed mobility with min assist consistently as a precursor for ADLs and to decrease burden of care   Plan Discharge plan remains appropriate    Co-evaluation                 End of Session Equipment Utilized During Treatment: Oxygen   Activity Tolerance Patient limited by pain   Patient Left in bed;with call bell/phone within reach;with nursing/sitter in room   Nurse Communication Mobility status;Patient requests pain meds        Time: 1450 (1450)-1536 OT Time Calculation (min): 46 min  Charges: OT General Charges $OT Visit: 1 Procedure OT Treatments $Therapeutic Activity: 38-52 mins  Tanner Campbell M 03/07/2016, 5:17 PM

## 2016-03-07 NOTE — Consult Note (Signed)
[facility] Consult: 11:14 AM 03/10/2016  LOS: 21 days    Referring Provider: Dr Sherral Hammers  Primary Care Physician:  No primary care provider on file. Primary Gastroenterologist:  Althia Forts, GI is in [place]     Reason for Consultation:  Cardiology wants pt to have EGD before they will cath hime   HPI: Tanner Campbell is a 40 y.o. male.  Hx ESRD. Malignant htn.   COPD, DM2. Anemia chronic dz.  Multiple sclerosis.  Dilated CM.  EF is 45 to 50%.    Was discharged from  [facility] [place] in 02/01/2016, after admission to [facility] for resp failure and volume overload.  Admitted 7/14 to [facility] with resp failure,non STEMI. Failed extubation attempts, s/p trach 03/03/2016.   Nuc stress test with reversible defect.     His hgb is low and cardiology wants GI to address the anemia and a "hx of GIB" before performing cath.    I am unable to locate information regarding his GIB.  However Dr Dorothy Puffer of [facility] hospitalist says that while on [facility] and getting Lovenox, his Hgb dropped and this was criteria for "GOI bleed" but no melena or GI bleeding cocumented.  Sole FOBT test in Epic is negative dated 02/29/16.   Hgb nadir 7.1 on 7/24.  MCVs normal.  Low platelets to 93 on 7/22.  Coags normal.  PRBC x 2: 1 on 7/25, 1 on 7/27.     No melena or BPR reported.  Pt has chronic nausea but no emesis.  Poor po intake/anorexia.  Mild pharyngeal dysphagia on MBSS but pt denies subjective dysphagia.  Pt reports EGD (for abdominal pain) and colonoscopy ~ 4 yrs ago in [place], both unremarkable.  Bruising easily  Pt is not compliant, signed off HD early 8/3 as he was uncomfortable.     Past Medical History:  Diagnosis Date  . COPD (chronic obstructive pulmonary disease) ([place])   . Diabetes mellitus without complication ([place])   .  Hemodialysis patient ([place])   . High cholesterol   . Hypertension   . Neuropathy ([place])   . Renal disorder     Past Surgical History:  Procedure Laterality Date  . BACK SURGERY    . CHOLECYSTECTOMY    . NECK SURGERY    . TEE WITHOUT CARDIOVERSION N/A 02/15/2016   Procedure: TRANSESOPHAGEAL ECHOCARDIOGRAM (TEE);  Surgeon: Sanda Klein, MD;  Location: [place];  Service: Cardiovascular;  Laterality: N/A;  . TRACHEOSTOMY TUBE PLACEMENT N/A 02/19/2016   Procedure: TRACHEOSTOMY;  Surgeon: Leta Baptist, MD;  Location: [facility] OR;  Service: ENT;  Laterality: N/A;    Prior to Admission medications   Medication Sig Start Date End Date Taking? Authorizing Provider  albuterol (PROVENTIL HFA;VENTOLIN HFA) 108 (90 Base) MCG/ACT inhaler Inhale 1-2 puffs into the lungs every 6 (six) hours as needed for wheezing or shortness of breath.    Historical Provider, MD  amLODipine (NORVASC) 5 MG tablet Take 5 mg by mouth daily.    Historical Provider, MD  atorvastatin (LIPITOR) 40 MG tablet Take 40 mg by mouth every  evening.    Historical Provider, MD  calcium acetate (PHOSLO) 667 MG capsule Take 2,001 mg by mouth 3 (three) times daily with meals.    Historical Provider, MD  carvedilol (COREG) 25 MG tablet Take 25 mg by mouth 2 (two) times daily with a meal.    Historical Provider, MD  DULoxetine (CYMBALTA) 30 MG capsule Take 30 mg by mouth daily.    Historical Provider, MD  Ferric Citrate (AURYXIA PO) Take 1 tablet by mouth daily.     Historical Provider, MD  folic acid-vitamin b complex-vitamin c-selenium-zinc (DIALYVITE) 3 MG TABS tablet Take 1 tablet by mouth daily.    Historical Provider, MD  furosemide (LASIX) 80 MG tablet Take 80 mg by mouth 2 (two) times daily.    Historical Provider, MD  gabapentin (NEURONTIN) 300 MG capsule Take 300 mg by mouth at bedtime.    Historical Provider, MD  lisinopril (PRINIVIL,ZESTRIL) 20 MG tablet Take 20 mg by mouth every evening.    Historical Provider, MD  risperiDONE  (RISPERDAL) 0.5 MG tablet Take 0.5 mg by mouth at bedtime.    Historical Provider, MD    Scheduled Meds: . antiseptic oral rinse  7 mL Mouth Rinse QID  . aspirin  81 mg Oral Daily  . atorvastatin  80 mg Oral q1800  . chlorhexidine gluconate (SAGE KIT)  15 mL Mouth Rinse BID  . darbepoetin (ARANESP) injection - DIALYSIS  200 mcg Intravenous Q Thu-HD  . feeding supplement (ENSURE ENLIVE)  237 mL Oral TID BM  . feeding supplement (NEPRO CARB STEADY)  237 mL Oral BID BM  . ferric gluconate (FERRLECIT/NULECIT) IV  125 mg Intravenous Q T,Th,Sa-HD  . gabapentin  300 mg Oral Q12H  . heparin subcutaneous  5,000 Units Subcutaneous Q8H  . insulin aspart  0-9 Units Subcutaneous Q4H  . ipratropium-albuterol  3 mL Nebulization TID  . isosorbide dinitrate  10 mg Oral TID  . metoprolol tartrate  12.5 mg Oral BID  . multivitamin  1 tablet Oral QHS  . pantoprazole sodium  40 mg Oral Daily  . sodium chloride flush  3 mL Intravenous Q12H   Infusions:   PRN Meds: sodium chloride, albuterol, dextrose, diphenhydrAMINE, metoprolol, morphine injection, nitroGLYCERIN, oxyCODONE, RESOURCE THICKENUP CLEAR, sennosides, sodium chloride flush   Allergies as of 03/04/2016 - Review Complete 02/04/2016  Allergen Reaction Noted  . Penicillins Other (See Comments) 12/22/2015    Family History  Problem Relation Age of Onset  . Rheum arthritis Neg Hx   . Osteoarthritis Neg Hx   . Asthma Neg Hx   . Cancer Neg Hx   . Diabetes Neg Hx     Social History   Social History  . Marital status: Single    Spouse name: N/A  . Number of children: N/A  . Years of education: N/A   Occupational History  . disabled    Social History Main Topics  . Smoking status: Current Every Day Smoker  . Smokeless tobacco: Not on file  . Alcohol use No  . Drug use: No  . Sexual activity: Not on file   Other Topics Concern  . Not on file   Social History Narrative  . No narrative on file    REVIEW OF  SYSTEMS: Constitutional:  + weight loss of unclear amount in recent months ENT:  No nose bleeds Pulm:  No SOB. CV:  No palpitations, no LE edema. No chest pain.  GU:  No hematuria, no frequency GI:  Per HPI.  Heme: bruising easily.    Transfusions:  2 PRBCs, 1 on 7/25, 1 on 7/27 Neuro:  No headaches, no peripheral tingling or numbness Derm:  No itching, no rash or sores.  Endocrine:  No sweats or chills.  No polyuria or dysuria Immunization:  Not questioned.  Travel:  None beyond local counties in last few months.    PHYSICAL EXAM: Vital signs in last 24 hours: Vitals:   04/02/2016 0442 03/17/2016 0741  BP: 130/64 122/62  Pulse:  72  Resp:  15  Temp: 98.2 F (36.8 C) 97.3 F (36.3 C)   Wt Readings from Last 3 Encounters:  04/01/2016 54.4 kg (120 lb)  02/01/16 66.4 kg (146 lb 6.2 oz)  12/24/15 75.1 kg (165 lb 9.6 oz)    General: cachectic, advanced chronic illness appearance.  Alert and comfortable Head:  No asymmetry or swelling  Eyes:  No icterus or pallor Ears:  Not HOH  Nose:  No discharge Mouth:  No blood, moist MM.  Tongue is midline.  Poor dentition Neck:  Trach in place, no blood at site Lungs:  Clear bil but diminished. + cough, sounds mucoid, loose.  Heart: RRR.  No mrg.  S1, s2 present.  Abdomen:  Lax folds of skin c/w a lot of weight loss.  NT.  Active BS.  No HSM, bruits, hernias or masses.   Rectal: pt declined.  See stage 2 sacral decub.    Musc/Skeltl: somewhat contracted posturing but able to straighten limbs and trunk.  Osteopenic looking Extremities:  No CCE.  Feet are warm  Neurologic:  Oriented x 3.  No tremor.  Moves all 4 limbs but strength not tested Skin:  No rash or sores.  Large bruise on upper right arm c/w where blood pressure cuff caused bruising   Psych:  Cooperative, affect dull but not tearful, emotionally labile or agitated.   Intake/Output from previous day: 08/03 0701 - 08/04 0700 In: -  Out: 684  Intake/Output this shift: No  intake/output data recorded.  LAB RESULTS:  Recent Labs  03/07/16 0845 03/29/2016 0351  WBC 5.6 5.4  HGB 8.8* 9.3*  HCT 29.2* 32.1*  PLT 191 169   BMET Lab Results  Component Value Date   NA 135 03/07/2016   NA 135 03/07/2016   NA 132 (L) 03/05/2016   K 3.6 03/07/2016   K 3.7 03/07/2016   K 4.1 03/05/2016   CL 92 (L) 03/07/2016   CL 92 (L) 03/07/2016   CL 91 (L) 03/05/2016   CO2 31 03/07/2016   CO2 31 03/07/2016   CO2 29 03/05/2016   GLUCOSE 105 (H) 03/07/2016   GLUCOSE 101 (H) 03/07/2016   GLUCOSE 72 03/05/2016   BUN 41 (H) 03/07/2016   BUN 42 (H) 03/07/2016   BUN 47 (H) 03/05/2016   CREATININE 4.96 (H) 03/07/2016   CREATININE 4.94 (H) 03/07/2016   CREATININE 3.98 (H) 03/05/2016   CALCIUM 8.7 (L) 03/07/2016   CALCIUM 8.6 (L) 03/07/2016   CALCIUM 8.5 (L) 03/05/2016   LFT  Recent Labs  03/07/16 0834  ALBUMIN 2.1*   PT/INR Lab Results  Component Value Date   INR 1.10 03/07/2016   INR 1.13 02/24/2016   INR 1.15 02/26/2016   Hepatitis Panel No results for input(s): HEPBSAG, HCVAB, HEPAIGM, HEPBIGM in the last 72 hours. C-Diff No components found for: CDIFF Lipase  No results found for: LIPASE  Drugs of Abuse  No results found for: LABOPIA, [place], LABBENZ, AMPHETMU, THCU, LABBARB  RADIOLOGY STUDIES: Nm Myocar Multi W/spect W/wall Motion / Ef  03/06/2016 There was no ST segment deviation noted during stress. Defect 1: There is a medium defect of severe severity present in the basal inferior and mid inferior location.  Findings consistent with prior myocardial infarction. This is an intermediate risk study. The left ventricular ejection fraction is moderately decreased (30-44%). Global hypokinesis worse in the inferior and inferoseptal regions.    Dg Swallowing Func-speech Pathology   03/06/2016 MBS-Modified Barium Swallow Study  Diagnosis Moderate pharyngeal phase dysphagia.  Pt has a moderate pharyngeal dysphagia with sensorimotor deficits. Oral  phase is generally with functional limits, but pharyngeal trigger is delayed. This results in deep, silent penetration before the swallow with thin liquids. Silent aspiration occurs with nectar thick liquids after the swallow secondary to residue remaining in the valleculae and pyriform sinuses, which he attempts to spontaneously clear with subswallows. Volitional cough/throat clearing is weak despite use of PMV and SLP cueing for increased force. Use of a chin tuck is effective at reducing pharyngeal residuals, therefore increasing airway protection. Recommend Dys 2 diet and nectar thick liquids with strict use of chin tuck. Pt should also wear PMV during all intake.  Germain Osgood, M.A. CCC-SLP [phone]              ENDOSCOPIC STUDIES: Nothing in Epic  IMPRESSION:   *  Normocytic anemia.  On EPO, parenteral iron.  This is multifactorial and acute on chronic. 2 PRBCs transfused last week.     *  "GI bleed" while on Lovenox.  The diagnosis was made based on drop in Hgb while on Lovenox a few weeks ago.  No documented FOBT + stools,  FOBT negative on 7/27.  On sub q Heparin currently Unremarkable EGD and colonoscopy ~ 4 yrs ago in [place].  Chronic nausea.  Bruises easily as evidenced by exam.     *  Non STEMI, reversible defect and reduced LVEF on nuc stress study.  Cardiology wants GI to investigate the "GI bleed " before undertaking cardiac Cath.     *  ESRD.  Non-compliance with HD led up to 01/2016 [facility] admission  *  IDDM  *  Pharyngeal dysphagia. On dysphagia 3 diet.    *  Protein cal malnutrition.  On formula supplements.  Poor appetite.     PLAN:     *  Can arrange for EGD next week, will need MAC sedation which is not available for non-emergent cases on weekends.  Has time for 1 PM on Monday 8/7, assuming he is stable.  Will revisit pt on Sunday or Monday. Prefer that q 8 hour sub q heparin be held for at least 8 hours before procedure, so will stop after 2200 dose on 8/6,  pt has PAS in place.     *  Resume Dysphagia 3, carb mod/diabetic diet.   *  Continue daily Protonix.     Azucena Freed  03/20/2016, 11:14 AM Pager: [phone]

## 2016-03-07 NOTE — Procedures (Signed)
I have personally attended this patient's dialysis session.   Had CP en route to HD 1st time, back to floor, 1 mg morphine, pain resolved, brought back to HD L AVF cannulated w/some difficulty d/t large hematoma from earlier infiltration. Using 17 ga needles - BFR 250 Pre HD weight 56.3 kg Current SBP 120's Labs pending UF goal is 2 liters as BP tolerates  Camille Bal, MD [facility] [phone] Pager 03/07/2016, 8:33 AM

## 2016-03-08 ENCOUNTER — Encounter (HOSPITAL_COMMUNITY): Admission: AD | Disposition: E | Payer: Self-pay | Attending: Internal Medicine

## 2016-03-08 LAB — CBC
HCT: 32.1 % — ABNORMAL LOW (ref 39.0–52.0)
HEMOGLOBIN: 9.3 g/dL — AB (ref 13.0–17.0)
MCH: 27.6 pg (ref 26.0–34.0)
MCHC: 29 g/dL — AB (ref 30.0–36.0)
MCV: 95.3 fL (ref 78.0–100.0)
Platelets: 169 10*3/uL (ref 150–400)
RBC: 3.37 MIL/uL — AB (ref 4.22–5.81)
RDW: 20 % — ABNORMAL HIGH (ref 11.5–15.5)
WBC: 5.4 10*3/uL (ref 4.0–10.5)

## 2016-03-08 LAB — CULTURE, BLOOD (ROUTINE X 2): CULTURE: NO GROWTH

## 2016-03-08 LAB — GLUCOSE, CAPILLARY
GLUCOSE-CAPILLARY: 88 mg/dL (ref 65–99)
GLUCOSE-CAPILLARY: 97 mg/dL (ref 65–99)
GLUCOSE-CAPILLARY: 181 mg/dL — AB (ref 65–99)
GLUCOSE-CAPILLARY: 181 mg/dL — AB (ref 65–99)
Glucose-Capillary: 123 mg/dL — ABNORMAL HIGH (ref 65–99)
Glucose-Capillary: 139 mg/dL — ABNORMAL HIGH (ref 65–99)
Glucose-Capillary: 155 mg/dL — ABNORMAL HIGH (ref 65–99)

## 2016-03-08 LAB — PROTIME-INR
INR: 1.1
PROTHROMBIN TIME: 14.2 s (ref 11.4–15.2)

## 2016-03-08 SURGERY — LEFT HEART CATH AND CORONARY ANGIOGRAPHY
Anesthesia: LOCAL

## 2016-03-08 MED ORDER — SODIUM CHLORIDE 0.9% FLUSH
3.0000 mL | Freq: Two times a day (BID) | INTRAVENOUS | Status: DC
  Administered 2016-03-08 – 2016-03-10 (×5): 3 mL via INTRAVENOUS

## 2016-03-08 MED ORDER — ALBUTEROL SULFATE (2.5 MG/3ML) 0.083% IN NEBU
2.5000 mg | INHALATION_SOLUTION | RESPIRATORY_TRACT | Status: DC | PRN
  Administered 2016-03-14: 2.5 mg via RESPIRATORY_TRACT
  Filled 2016-03-08: qty 3

## 2016-03-08 MED ORDER — SODIUM CHLORIDE 0.9% FLUSH: 3.0000 mL | INTRAVENOUS | Status: DC | PRN

## 2016-03-08 NOTE — Progress Notes (Signed)
Physical Therapy Treatment Patient Details Name: Tanner Campbell MRN: [id number] DOB: May 27, 1976 Today's Date: 04/02/2016    History of Present Illness Tanner Campbell is an 40 y.o. 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 He was intubated 6/23-7/5 (self extubated), 7/10-7/16 (self extubated), was then reintubated. Initially, sputum was positive for H influenza and subsequently for MSSA and Escherichia coli and blood cultures growing a gram-negative anaerobic organism. Thoracentesis on right showed borderline exudate by LDH criteria. [name] 7/18; passed swallow study 8/2; cardiolite stress test 8/2 with global hypokinesis EF 40% and prior infarct    PT Comments    Patient initially refused therapy due to back pain and fatigue from sitting up for 2 hours (RN confirmed). RN able to provide IV pain meds and pt agreed to try. Stood with RW and 2 person assist x45 seconds. Primarily limited by pain today.   Follow Up Recommendations  Supervision/Assistance - 24 hour;LTACH     Equipment Recommendations  Other (comment) (TBA)    Recommendations for Other Services       Precautions / Restrictions Precautions Precautions: Fall Precaution Comments: Trach; Dys 2 with nectar thick liquids Restrictions Weight Bearing Restrictions: No    Mobility  Bed Mobility Overal bed mobility: Needs Assistance Bed Mobility: Rolling;Sidelying to Sit;Sit to Sidelying Rolling: Supervision Sidelying to sit: HOB elevated;Min assist     Sit to sidelying: Min assist General bed mobility comments: uses rail; HOB elevated; vc for technique; assist to raise torso due to weakness  Transfers Overall transfer level: Needs assistance Equipment used: Rolling walker (2 wheeled)   Sit to Stand: Mod assist;+2 physical assistance;From elevated surface         General transfer comment: attempted stand from normal height bed to RW with pt unable to achieve full upright once he transitioned hands from bed to RW; second attempt bed elevated with good success  Ambulation/Gait                 Stairs            Wheelchair Mobility    Modified Rankin (Stroke Patients Only)       Balance     Sitting balance-Leahy Scale: Fair     Standing balance support: Bilateral upper extremity supported Standing balance-Leahy Scale: Poor Standing balance comment: stood with RW x 45 seconds with upright posture and controlled RR                    Cognition Arousal/Alertness: Awake/alert Behavior During Therapy: Anxious Overall Cognitive Status: Within Functional Limits for tasks assessed   Orientation Level:  (thinks he is in [place] and that it is August)                  Exercises      General Comments General comments (skin integrity, edema, etc.): Patient requested return to bed due to back pain and sat up x 2 hrs earlier. As returned to sidelying, pt with incr RR (stated due to pain). Attempted relaxation, breathing techniques with limited success. Pt then stated he was afraid his nurse is going to hurt him. Unit director making rounds and encouraged her to check on pt      Pertinent Vitals/Pain SaO2 97-100% throughout on 28% trach collar   Pain Assessment: 0-10 Pain Score: 10-Worst pain  ever Pain Location: low back Pain Descriptors / Indicators: Grimacing;Guarding Pain Intervention(s): Limited activity within patient's tolerance;Monitored during session;Premedicated before session;Repositioned;Patient requesting pain meds-RN notified;RN gave pain meds during session (RN provided IV meds)    Home Living                      Prior Function            PT Goals (current goals can now be found in the care plan section) Acute Rehab PT Goals Patient Stated Goal: to get stronger Time For Goal  Achievement: 03/19/16 Progress towards PT goals: Progressing toward goals    Frequency  Min 3X/week    PT Plan Current plan remains appropriate (denied inpatient rehab)    Co-evaluation             End of Session Equipment Utilized During Treatment: Oxygen;Gait belt (trach) Activity Tolerance: Patient limited by pain Patient left: with call bell/phone within reach;in bed     Time: 1524-1550 PT Time Calculation (min) (ACUTE ONLY): 26 min  Charges:  $Therapeutic Activity: 23-37 mins                    G Codes:      Jakeisha Stricker 03/19/2016, 5:44 PM Pager [phone]

## 2016-03-08 NOTE — Progress Notes (Signed)
[facility] Rounding Note Subjective/Interval History:   Signed off dialysis early yesterday Had nuclear stress test - global hypokinesis, evidence old infarct, intermediate risk study - cardiology would like to do left heart cath at some point (they want seen by GI first) About to eat lunch  Objective: Vital signs in last 24 hours: Temp:  [97.2 F (36.2 C)-98.8 F (37.1 C)] 97.3 F (36.3 C) (08/04 0741) Pulse Rate:  [68-91] 72 (08/04 0741) Resp:  [11-20] 15 (08/04 0741) BP: (86-150)/(48-82) 122/62 (08/04 0741) SpO2:  [96 %-100 %] 100 % (08/04 0741) FiO2 (%):  [28 %-31 %] 28 % (08/04 0741) Weight:  [54.4 kg (120 lb)-56.2 kg (124 lb)] 54.4 kg (120 lb) (08/04 0455) Weight change: -0.853 kg (-1 lb 14.1 oz)   Physical Examination BP 122/62   Pulse 72   Temp 97.3 F (36.3 C) (Oral)   Resp 15   Ht 5' 5"  (1.651 m)   Wt 54.4 kg (120 lb)   SpO2 100%   BMI 19.97 kg/m   NAD  Trach collar Lungs ant clear Cardiac rhythm regular S1S2 No s3 Abd soft + BS In SCD's.  Minimal if any edema  Left upper arm AVF with + bruit and thrill, bruising from prior infiltration   Lab Results:  Recent Labs  03/07/16 0845 03/07/2016 0351  WBC 5.6 5.4  HGB 8.8* 9.3*  HCT 29.2* 32.1*  PLT 191 169    Recent Labs  03/07/16 0834  NA 135  135  K 3.7  3.6  CL 92*  92*  CO2 31  31  GLUCOSE 101*  105*  BUN 42*  41*  CREATININE 4.94*  4.96*  CALCIUM 8.6*  8.7*  Phosphorus                         4.6  Studies/Results: Nm Myocar Multi W/spect W/wall Motion / Ef  Result Date: 03/06/2016  There was no ST segment deviation noted during stress.  Defect 1: There is a medium defect of severe severity present in the basal inferior and mid inferior location.  Findings consistent with prior myocardial infarction.  This is an intermediate risk study.  The left ventricular ejection fraction is moderately decreased (30-44%).  Global hypokinesis worse in the inferior and inferoseptal regions.     Dg Swallowing Func-speech Pathology  Result Date: 03/06/2016 Objective Swallowing Evaluation: Type of Study: MBS-Modified Barium Swallow Study Patient Details Name: Tanner Campbell MRN: [id number] Date of Birth: 1975-08-22 Today's Date: 03/06/2016 Time: SLP Start Time (ACUTE ONLY): 1358-SLP Stop Time (ACUTE ONLY): 1417 SLP Time Calculation (min) (ACUTE ONLY): 19 min Past Medical History: Past Medical History: Diagnosis Date . COPD (chronic obstructive pulmonary disease) ([place])  . Diabetes mellitus without complication ([place])  . Hemodialysis patient ([place])  . High cholesterol  . Hypertension  . Neuropathy ([place])  . Renal disorder  Past Surgical History: Past Surgical History: Procedure Laterality Date . BACK SURGERY   . CHOLECYSTECTOMY   . NECK SURGERY   . TEE WITHOUT CARDIOVERSION N/A 02/11/2016  Procedure: TRANSESOPHAGEAL ECHOCARDIOGRAM (TEE);  Surgeon: Sanda Klein, MD;  Location: [place];  Service: Cardiovascular;  Laterality: N/A; . TRACHEOSTOMY TUBE PLACEMENT N/A 02/18/2016  Procedure: TRACHEOSTOMY;  Surgeon: Leta Baptist, MD;  Location: [facility];  Service: ENT;  Laterality: N/A; HPI: 40 year old male admitted 02/25/2016 due to recurrent sepsis, failure to wean, multiple intubations. PMH significant forDM2, HTN, HLD, ESRD on HD, COPD. Trach placed 02/24/2016.  Subjective: pt alert,  eager to [place] / Plan / Recommendation CHL IP CLINICAL IMPRESSIONS 03/06/2016 Therapy Diagnosis Moderate pharyngeal phase dysphagia Clinical Impression Pt has a moderate pharyngeal dysphagia with sensorimotor deficits. Oral phase is generally with functional limits, but pharyngeal trigger is delayed. This results in deep, silent penetration before the swallow with thin liquids. Silent aspiration occurs with nectar thick liquids after the swallow secondary to residue remaining in the valleculae and pyriform sinuses, which he attempts to spontaneously clear with subswallows. Volitional cough/throat clearing is weak despite use of PMV  and SLP cueing for increased force. Use of a chin tuck is effective at reducing pharyngeal residuals, therefore increasing airway protection. Recommend Dys 2 diet and nectar thick liquids with strict use of chin tuck. Pt should also wear PMV during all intake. Will continue to follow for tolerance and readiness to advance. Impact on safety and function Mild aspiration risk;Moderate aspiration risk   CHL IP TREATMENT RECOMMENDATION 03/06/2016 Treatment Recommendations Therapy as outlined in treatment plan below   Prognosis 03/06/2016 Prognosis for Safe Diet Advancement Good Barriers to Reach Goals -- Barriers/Prognosis Comment -- [facility] IP DIET RECOMMENDATION 03/06/2016 SLP Diet Recommendations Dysphagia 2 (Fine chop) solids;Nectar thick liquid Liquid Administration via Cup;No straw Medication Administration Crushed with puree Compensations Slow rate;Small sips/bites;Multiple dry swallows after each bite/sip;Chin tuck Postural Changes Remain semi-upright after after feeds/meals (Comment);Seated upright at 90 degrees   CHL IP OTHER RECOMMENDATIONS 03/06/2016 Recommended Consults -- Oral Care Recommendations Oral care BID Other Recommendations Order thickener from pharmacy;Prohibited food (jello, ice cream, thin soups);Remove water pitcher;Place PMSV during PO intake   CHL IP FOLLOW UP RECOMMENDATIONS 03/06/2016 Follow up Recommendations Skilled Nursing facility   [facility] IP FREQUENCY AND DURATION 03/06/2016 Speech Therapy Frequency (ACUTE ONLY) min 2x/week Treatment Duration 2 weeks      CHL IP ORAL PHASE 03/06/2016 Oral Phase WFL Oral - Pudding Teaspoon -- Oral - Pudding Cup -- Oral - Honey Teaspoon -- Oral - Honey Cup -- Oral - Nectar Teaspoon -- Oral - Nectar Cup -- Oral - Nectar Straw -- Oral - Thin Teaspoon -- Oral - Thin Cup -- Oral - Thin Straw -- Oral - Puree -- Oral - Mech Soft -- Oral - Regular -- Oral - Multi-Consistency -- Oral - Pill -- Oral Phase - Comment --  CHL IP PHARYNGEAL PHASE 03/06/2016 Pharyngeal Phase Impaired  Pharyngeal- Pudding Teaspoon -- Pharyngeal -- Pharyngeal- Pudding Cup -- Pharyngeal -- Pharyngeal- Honey Teaspoon Delayed swallow initiation-vallecula;Reduced epiglottic inversion;Reduced anterior laryngeal mobility;Reduced laryngeal elevation;Reduced airway/laryngeal closure;Reduced tongue base retraction;Pharyngeal residue - valleculae;Pharyngeal residue - pyriform Pharyngeal -- Pharyngeal- Honey Cup Delayed swallow initiation-vallecula;Reduced epiglottic inversion;Reduced anterior laryngeal mobility;Reduced laryngeal elevation;Reduced airway/laryngeal closure;Reduced tongue base retraction;Pharyngeal residue - valleculae;Pharyngeal residue - pyriform;Compensatory strategies attempted (with notebox) Pharyngeal -- Pharyngeal- Nectar Teaspoon Delayed swallow initiation-vallecula;Reduced epiglottic inversion;Reduced anterior laryngeal mobility;Reduced laryngeal elevation;Reduced airway/laryngeal closure;Reduced tongue base retraction;Pharyngeal residue - valleculae;Pharyngeal residue - pyriform Pharyngeal Material does not enter airway Pharyngeal- Nectar Cup Delayed swallow initiation-vallecula;Reduced epiglottic inversion;Reduced anterior laryngeal mobility;Reduced laryngeal elevation;Reduced airway/laryngeal closure;Reduced tongue base retraction;Pharyngeal residue - valleculae;Pharyngeal residue - pyriform;Penetration/Apiration after swallow;Compensatory strategies attempted (with notebox) Pharyngeal Material enters airway, passes BELOW cords without attempt by patient to eject out (silent aspiration) Pharyngeal- Nectar Straw -- Pharyngeal -- Pharyngeal- Thin Teaspoon NT Pharyngeal -- Pharyngeal- Thin Cup Reduced epiglottic inversion;Reduced anterior laryngeal mobility;Reduced laryngeal elevation;Reduced airway/laryngeal closure;Reduced tongue base retraction;Pharyngeal residue - valleculae;Pharyngeal residue - pyriform;Delayed swallow initiation-pyriform sinuses;Compensatory strategies attempted (with notebox)  Pharyngeal -- Pharyngeal- Thin Straw Reduced epiglottic inversion;Reduced anterior laryngeal mobility;Reduced laryngeal  elevation;Reduced airway/laryngeal closure;Reduced tongue base retraction;Pharyngeal residue - valleculae;Pharyngeal residue - pyriform;Penetration/Aspiration before swallow;Delayed swallow initiation-pyriform sinuses;Compensatory strategies attempted (with notebox) Pharyngeal Material enters airway, CONTACTS cords and not ejected out Pharyngeal- Puree Delayed swallow initiation-vallecula;Reduced epiglottic inversion;Reduced anterior laryngeal mobility;Reduced laryngeal elevation;Reduced airway/laryngeal closure;Reduced tongue base retraction;Pharyngeal residue - valleculae;Pharyngeal residue - pyriform;Compensatory strategies attempted (with notebox) Pharyngeal -- Pharyngeal- Mechanical Soft Delayed swallow initiation-vallecula;Reduced epiglottic inversion;Reduced anterior laryngeal mobility;Reduced laryngeal elevation;Reduced airway/laryngeal closure;Reduced tongue base retraction;Pharyngeal residue - valleculae;Pharyngeal residue - pyriform;Compensatory strategies attempted (with notebox) Pharyngeal -- Pharyngeal- Regular -- Pharyngeal -- Pharyngeal- Multi-consistency -- Pharyngeal -- Pharyngeal- Pill -- Pharyngeal -- Pharyngeal Comment --  CHL IP CERVICAL ESOPHAGEAL PHASE 03/06/2016 Cervical Esophageal Phase WFL Pudding Teaspoon -- Pudding Cup -- Honey Teaspoon -- Honey Cup -- Nectar Teaspoon -- Nectar Cup -- Nectar Straw -- Thin Teaspoon -- Thin Cup -- Thin Straw -- Puree -- Mechanical Soft -- Regular -- Multi-consistency -- Pill -- Cervical Esophageal Comment -- No flowsheet data found. Tanner Campbell 03/06/2016, 2:36 PM  Tanner Campbell, M.A. CCC-SLP [phone]             Current Medications . antiseptic oral rinse  7 mL Mouth Rinse QID  . aspirin  81 mg Oral Daily  . atorvastatin  80 mg Oral q1800  . chlorhexidine gluconate (SAGE KIT)  15 mL Mouth Rinse BID  . darbepoetin  (ARANESP) injection - DIALYSIS  200 mcg Intravenous Q Thu-HD  . feeding supplement (ENSURE ENLIVE)  237 mL Oral TID BM  . feeding supplement (NEPRO CARB STEADY)  237 mL Oral BID BM  . ferric gluconate (FERRLECIT/NULECIT) IV  125 mg Intravenous Q T,Th,Sa-HD  . gabapentin  300 mg Oral Q12H  . heparin subcutaneous  5,000 Units Subcutaneous Q8H  . insulin aspart  0-9 Units Subcutaneous Q4H  . ipratropium-albuterol  3 mL Nebulization TID  . isosorbide dinitrate  10 mg Oral TID  . metoprolol tartrate  12.5 mg Oral BID  . multivitamin  1 tablet Oral QHS  . pantoprazole sodium  40 mg Oral Daily  . sodium chloride flush  3 mL Intravenous Q12H    Background: 40 yo with DM, HTN, HLD, MS, COPD. ESRD on HD since 04/2014 via AVF. Acute resp failure 01/2015 [facility] with AMS, recurrent acute hypoxic resp failure, + trops - NSTEMI vs demand ischemia, GIB, bacteremia (Veillonella; new GPC 7/30 1/2). We were asked to provide his TTS HD  Assessment/Plan:  1. ESRD  TTS [facility] (more recently in [facility] HD Saturday. Signed off early yesterday 2. Chest pain. NSTEMI. 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 Reduced EF 30-44%. Cardiology wants to do left heart cath eventually - their note reviewed. Tey want GI to see. 3. Mild to moderate AI 4. Acute on chronic resp failure - per CCM. On trach collar. No decannulation unti more physically fit per CCM. 5. Bilateral pleural effusions - s/p thoracentesis T 6/28 1.1 L. BP has limited getting dry enough to help w/pleural effusions (still seen on radiograph 7/30) 6. Bacteremias (veillonella, GCP clusters 7/30) - s/p cefipime, and finished 14 days flagyl.  No new Rx for GPC as felt to be contaminant  7. Debility - PT 8. Anemia - CKD + ? BL. S/p PRBC's X 1 on 7/25, 7/27. Getting darbe 200 + ferrlecit.  9. Severe malnutrition - per primary  service 10. Disposition - will need LTAC. No HD unit in [place] would  accept with trach even if SNF takes - would need to be decannulated. Not sure about his [facility] in [place] - they MIGHT take with trach - but independent of that would need to be able to sit in a chair for 4-6 hours in order to do any type of outpt HD.  Jamal Maes, MD [facility] [phone] Pager 03/22/2016, 8:22 AM

## 2016-03-08 NOTE — Progress Notes (Signed)
Occupational Therapy Treatment Patient Details Name: Tanner Campbell MRN: [id number] DOB: 04/21/1976 Today's Date: 03/18/2016    History of present illness Tanner Campbell is an 40 y.o. male with PMH of DM II, HTN, HLD, ESRD on HD (T, Th, S), COPD and prior back surgery who initially was admitted to [facility] from 6/21 - 6/29 for acute respiratory failure in the setting of volume overload. He was intubated 6/23-7/5 (self extubated), 7/10-7/16 (self extubated), was then reintubated. Initially, sputum was positive for H influenza and subsequently for MSSA and Escherichia coli and blood cultures growing a gram-negative anaerobic organism. Thoracentesis on right showed borderline exudate by LDH criteria. Tanner Campbell 7/18; passed swallow study 8/2; cardiolite stress test 8/2 with global hypokinesis EF 40% and prior infarct   OT comments  Pt fatigued this pm, but agreeable to working with OT.  He requires mod A for bed mobility and performed bil. UE exercises.  Moved sit to partial stand x 2 with mod cues.  VSS.  He continues to be very motivated.  He requested to remain sitting EOB to eat meal - RN notified.   Follow Up Recommendations  CIR;Supervision/Assistance - 24 hour    Equipment Recommendations  None recommended by OT    Recommendations for Other Services      Precautions / Restrictions Precautions Precautions: Fall Precaution Comments: Tanner Campbell; Dys 2 with nectar thick liquids Restrictions Weight Bearing Restrictions: No       Mobility Bed Mobility Overal bed mobility: Needs Assistance Bed Mobility: Sidelying to Sit Rolling: Supervision Sidelying to sit: Mod assist     Sit to sidelying: Min assist General bed mobility comments: Mod assist to lift trunk   Transfers Overall transfer level: Needs assistance Equipment used: Rolling walker (2 wheeled)   Sit to Stand: Mod assist;+2 physical assistance;From elevated surface         General transfer comment: Pt performed sit to partial  stand x 2 with mod A - he is fatigued this pm     Balance Overall balance assessment: Needs assistance Sitting-balance support: Feet supported Sitting balance-Leahy Scale: Fair     Standing balance support: Bilateral upper extremity supported Standing balance-Leahy Scale: Poor Standing balance comment: stood with RW x 45 seconds with upright posture and controlled RR                   ADL                                                Vision                     Perception     Praxis      Cognition   Behavior During Therapy: WFL for tasks assessed/performed Overall Cognitive Status: Impaired/Different from baseline Area of Impairment: Problem solving Orientation Level:  (thinks he is in [place] and that it is August)            Problem Solving: Slow processing      Extremity/Trunk Assessment               Exercises General Exercises - Upper Extremity Shoulder Flexion: AAROM;Strengthening;Both;20 reps;Seated   Shoulder Instructions       General Comments      Pertinent Vitals/ Pain       Pain Assessment: Faces Pain Score: 10-Worst pain ever Faces  Pain Scale: Hurts even more Pain Location: low back and buttocks  Pain Descriptors / Indicators: Grimacing Pain Intervention(s): Monitored during session  Home Living                                          Prior Functioning/Environment              Frequency Min 2X/week     Progress Toward Goals  OT Goals(current goals can now be found in the care plan section)  Progress towards OT goals: Progressing toward goals  Acute Rehab OT Goals Patient Stated Goal: to get stronger ADL Goals Pt Will Perform Grooming: with supervision;sitting Pt Will Perform Upper Body Bathing: with min assist;sitting Pt Will Transfer to Toilet: with mod assist;bedside commode;stand pivot transfer Additional ADL Goal #1: Pt will perform sit to/from stands  consistently with min assist as a precursor for ADLs  Additional ADL Goal #2: Pt will engage in bed mobility with min assist consistently as a precursor for ADLs and to decrease burden of care   Plan Discharge plan remains appropriate    Co-evaluation                 End of Session Equipment Utilized During Treatment: Oxygen   Activity Tolerance Patient tolerated treatment well   Patient Left in bed;with call bell/phone within reach (Pt sitting EOB )   Nurse Communication Mobility status        Time: 9629-5284 OT Time Calculation (min): 24 min  Charges: OT General Charges $OT Visit: 1 Procedure OT Treatments $Therapeutic Activity: 23-37 mins  Kamila Broda M 03/21/2016, 6:20 PM

## 2016-03-08 NOTE — Care Management Important Message (Signed)
Important Message  Patient Details  Name: Tanner Campbell MRN: [id number] Date of Birth: 07/17/1976   Medicare Important Message Given:  Yes    Shariff Lasky Abena 03/26/2016, 10:35 AM

## 2016-03-08 NOTE — Progress Notes (Addendum)
PULMONARY / CRITICAL CARE MEDICINE   Name: Tanner Campbell MRN: [id number] DOB: 1975-10-11    ADMISSION DATE:  02/10/2016  REFERRING MD:  Sharyon Medicus -->[place]   CHIEF COMPLAINT:  Prolonged critical illness, failure to wean, recurrent sepsis. PCCM following for trach care.   HPI 40 year old male w/ ESRD whom we initially admitted to [facility] from [facility] where he was admitted for ongoing care for PNA and AECOPD. His course was complicated by: HCAP, AECOPD, bacteremia (veillonella species), NSTEMI, pericardial friction rub, GIB, and acute encephalopathy and now severe physical deconditioning and protein calorie malnutrition. Was transferred to [facility] per family request & on-going care. Eventually underwent trach on 7/18 was liberated from the vent and transferred to the IM service. He has been making slow progress. His trach now changed to 6 cuffless and ENT has singed off. PCCM was asked to assist w/ vent trach management and timing of decannulation.    SUBJECTIVE:  Had MBS earlier today, SLP recommending dysphagia 2 diet with chin tucks.  Also had nuclear stress test today.  No changes / ischemia during stress. More awake an conversant today.  Wants to eat.  VITAL SIGNS: BP (!) 119/59   Pulse 78   Temp 97.3 F (36.3 C) (Oral)   Resp 11   Ht  (1.651 m)   Wt 120 lb (54.4 kg)   SpO2 96%   BMI 19.97 kg/m   VENTILATOR SETTINGS: FiO2 (%):  [28 %-31 %] 28 %  INTAKE / OUTPUT: I/O last 3 completed shifts: In: -  Out: 684 [Other:684]  PHYSICAL EXAMINATION: General: Awake, alert, in NAD Neuro: Follows commands, oriented, remains weak, speech and cough are stronger. Phonation unchanged w/ finger occlusion.  HEENT: Trach in place, 6 cuffless. PMV voice quality is weak though better Cardiac: regular, no murmur Chest: normal work of breathing, clear and w/out accessory use.  Abd: soft, non tender Ext: no edema, decreased muscle bulk Skin: multiple areas of healing excoriations on the lower  extremities  LABS:  BMET  Recent Labs Lab 03/04/16 0343 03/05/16 0644 03/07/16 0834  NA 135 132* 135  135  K 4.0 4.1 3.7  3.6  CL 93* 91* 92*  92*  CO2 32 BUN 29* 47* 42*  41*  CREATININE 3.11* 3.98* 4.94*  4.96*  GLUCOSE 127* 72 101*  105*   CBC  Recent Labs Lab 03/05/16 0334 03/07/16 0845 03/05/2016 0351  WBC 6.2 5.6 5.4  HGB 10.2* 8.8* 9.3*  HCT 34.9* 29.2* 32.1*  PLT 217 191 169    STUDIES:  7/13 Echo >> EF 40 to 45%, mod LVH, small pericardial effusion 7/17 CXR >> stable bilateral pleural effusions, stable patchy opacities in the lung bases- pneumonia vs atelectasis ANA neg  CULTURES: BCX2 7/10: Veillonella species (Gram negative coccobacilli) resp culture 7/10: Moderate SA and E.coli (both pan sensitive)  ANTIBIOTICS: cipro 7/12>>>7/14 vanc 7/2>>>7/17 Cefepime 7/14>>>7/21 Fluconazole 7/7>>>7/17 Flagyl 7/17 >>>   SIGNIFICANT EVENTS: 7/14 Transfer from [facility] to [facility] 7/18 Trach placed 7/22 off vent to SDU. 7/25 PCCM asked to assist w/ trach care   LINES/TUBES: OETT 7/10>>>7/17; 7/17 >>> Right IJ PICC (? 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    Interval from 8/2-->8/4: has gotten much stronger over the last week. Now sitting up at bedside. Using PMV. Voice & cough quality still a little weak but improved. New concern for MS which has been a chronic question but just now identified here at [facility]. Not sure to what extent this has played. GI is planning on EGD some point next week & then cards planning on cardiac cath.    Plan Continue to work w/ SLP for improved voice quality Continue aggressive PT  Continue routine trach care  Will re-evaluate him on 8/7-->if he continues to progress hope to attempt capping trials soon (ideally after he has his EGD). When we do this would have him do 48 hrs w/ cont pulse ox before successfully and w/out incident before we would consider decannulation.  ? Neuro work up for possible MS At some point he will need cardiac cath; but cardiology wants wants EGD first given h/o GIB at ssh  Plan is for [facility] vs other SNF that accepts trach-HD (pt's sister does not want him to return to [facility])   PCCM will follow intermittently for trach / vent needs.  Simonne Martinet ACNP-BC [facility] Pulmonary/Critical Care Pager # [phone] OR # [phone] if no answer  Attending Note:  40 year old male with critical illness due to HCAP and AE of COPD now trached. On exam, he is cachectic with trach in place and coarse BS diffusely. I reviewed CXR myself, trach is in good position. Patient is interactive. Discussed with PCCM-NP and bedside RN.  Acute on chronic respiratory failure: - Continue weaning efforts.  Tracheostomy status: - Maintain current trach size and type. - May proceed with PMV if able.             - No decannulation until able to cooperate more with PT.  - Anticipate will be able to decannulate farther down the line.  Hypoxemia: - Titrate O2 for sat of 88-92%. - May need ambulatory desat study when able to ambulate for home  O2.  COPD: - Bronchodilators as ordered. - PRN albuterol.  PCCM will see again on Monday.  Patient seen and examined, agree with above note. I dictated the care and orders written for this patient under my direction.  Alyson Reedy, MD [phone]

## 2016-03-08 NOTE — Progress Notes (Signed)
[place] TEAM 1 - Stepdown/ICU TEAM  Caelan Branden  ZOX:[id number] DOB: 01/05/1976 DOA: 02/26/2016 PCP: No primary care provider on file.    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  He decompensated 6/23 and required intubation. The patient was found to have H. Influenza positive sputum and treated with Rocephin. CXR demonstrated a large right pleural effusion and he underwent a thoracentesis (1L serous fluid removed - exudative by protein). He had difficulty with weaning and was transferred to [facility] on 6/29 orally intubated for ventilator weaning. The patient developed fever and antibiotics were expanded to vancomycin, cefepime and diflucan. He had progressed to weaning on PSV x 4 hours as of 7/3. He self extubated on 7/5 but required BiPAP off and on thereafter. After HD 7/10his mental status worsened and he became minimally responsive. CXR was consistent with RLL opacification and he was re-intubated. He was re-cultured and also found to have increased Troponin for which Cardiology was consulted. He was placed on LMWH, but had a GIB while on this w/ his hgb dropping as low as 8.4.  On 7/14 he remained ventilator dependent. His sputum cultures grew MSSA and E coli and he was noted to have + blood cultures. Because of these acutely worsening issues it was felt best to transfer him to the ICU at [facility].   Significant Events: 6/21 admitted at [facility] 6/29 transfer to [facility] 7/14 transfer from [facility] to [facility] 7/18 trach placed  Subjective: Pt is alert and oriented and able to describe to me details concerning his recent and prior care.  He knows where he is and why he is here.  He currently denies cp, sob, n/v, or abdom pain.  He is in agreement with having a cardiac cath, and believes it is to be performed today.    Assessment  & Plan:  Acute hypoxic respiratory failure - s/p trach 7/18 - HCAP Ventilator management and trach care per PCCM - has completed a course of cefepime - currently tolerating trach collar without difficulty - respiratory status is the most stable it has been since his admit to [facility]   Coag negative Staph species in blood cx Blood cx obtained 7/30 1 of 2 + - pt does not appear toxic - afeb w/ normal WBC - most consistent with contaminant  Bacteremia with Veillonella species (Gram negative rod) completed Flagyl x 2 weeks for bacteremia as per ID recs  Bilateral pleural effusions s/p thoracentesis Stable on follow-up chest x-ray 7/21 - no respiratory distress w/ stable oxygenation  NSTEMI vs demand ischemia nuclear medicine stress test noted basal inferior and mid inferior perfusion defecst - Cards following again and planning for cardiac cath   Small pericardial effusion with friction rub - resolved  ANA negative - TEE without evidence of endocarditis/vegetations - hemodynamically stable - rub has resolved  Mild to moderate aortic regurgitation Hemodynamically stable   ESRD on HD Tue/Th/Sat Nephrology following   Recent GIB on LMWH No evidence of acute blood loss a this time   Anemia of chronic kidney disease + acute blood loss  No evidence of blood loss, but Hgb drifted down to 7.1 - s/p 1U PRBC 7/24 - Hgb has improved as expected and now appears stable - no clinical evidence of GIB at this time - reports of  acute blood loss date back to [facility] when pt was placed on lovenox during episode of CP (no records available in Epic)  Recent Labs Lab 03/03/16 1306 03/04/16 0343 03/05/16 0334 03/07/16 0845 03/07/2016 0351  HGB 10.0* 10.1* 10.2* 8.8* 9.3*    DM type II w/ hyperglycemia  CBG stable   Acute Encephalopathy in setting of critical illness Resolved  - pt clearly is presently A&O and possesses capacity to make his own decisions   Severe malnutrition in context of acute  illness Patient is much more alert and has now been cleared for diet - discontinue tube feeds  Dysphagia Cleared for modified diet by SLP  Disposition Planning  Pending - difficult to place due to trach AND ESRD on HD - pt now has capacity to make his own decisions regarding placement post hospital   DVT prophylaxis: SQ heparin  Code Status: FULL CODE Family Communication: no family present at time of exam  Disposition Plan: SDU   Consultants:  PCCM Cardiology  ID Nephrology  ENT  Antimicrobials:  Cipro 7/12>7/14 Vanc 7/2>7/17 Cefepime 7/14>7/21 Fluconazole 7/7>7/17 Flagyl 7/17 > 7/31  Objective: Blood pressure 122/62, pulse 72, temperature 97.3 F (36.3 C), temperature source Oral, resp. rate 15, height 5' 5"  (1.651 m), weight 54.4 kg (120 lb), SpO2 100 %.  Intake/Output Summary (Last 24 hours) at 03/27/2016 0931 Last data filed at 03/07/16 2005  Gross per 24 hour  Intake                0 ml  Output              684 ml  Net             -684 ml   Filed Weights   03/07/16 0820 03/07/16 1154 03/27/2016 0455  Weight: 56.3 kg (124 lb 1.9 oz) 56.2 kg (124 lb) 54.4 kg (120 lb)    Examination: General: No acute respiratory distress - alert/conversant/oriented  Lungs: no wheeze or focal crackles - on TC w/ PMV in place and phonating w/o difficulty  Cardiovascular: Regular rate and rhythm w/o rub or M  Abdomen: Nondistended, soft, bowel sounds positive, no rebound  Extremities: No significant cyanosis, clubbing, edema B LE   CBC:  Recent Labs Lab 03/03/16 1306 03/04/16 0343 03/05/16 0334 03/07/16 0845 03/16/2016 0351  WBC 7.0 7.7 6.2 5.6 5.4  NEUTROABS 5.6 5.1  --   --   --   HGB 10.0* 10.1* 10.2* 8.8* 9.3*  HCT 32.7* 34.2* 34.9* 29.2* 32.1*  MCV 92.9 93.2 94.1 92.1 95.3  PLT 213 221 217 191 700   Basic Metabolic Panel:  Recent Labs Lab 03/02/16 0351 03/03/16 1306 03/04/16 0343 03/05/16 0644 03/07/16 0834  NA 133* 134* 135 132* 135  135  K 4.0 3.3* 4.0  4.1 3.7  3.6  CL 94* 91* 93* 91* 92*  92*  CO2 26 31 32 29 31  31   GLUCOSE 93 147* 127* 72 101*  105*  BUN 26* 20 29* 47* 42*  41*  CREATININE 2.97* 2.62* 3.11* 3.98* 4.94*  4.96*  CALCIUM 8.4* 8.3* 8.6* 8.5* 8.6*  8.7*  MG 2.0 2.1 2.3  --  2.4  PHOS  --   --   --  4.6 4.9*     GFR: Estimated Creatinine Clearance: 15.1 mL/min (by C-G formula based on SCr of 4.96 mg/dL).  Liver Function Tests:  Recent Labs Lab 03/05/16 0644 03/07/16 0834  ALBUMIN 1.9* 2.1*    Cardiac Enzymes:  Recent Labs Lab 03/02/16 2100 03/03/16 0814 03/04/16 1615 03/04/16 2131 03/05/16 0334  TROPONINI 0.07* 0.07*  0.07* 0.06* 0.05* 0.07*    HbA1C: Hgb A1c MFr Bld  Date/Time Value Ref Range Status  02/02/2016 06:50 AM 7.7 (H) 4.8 - 5.6 % Final    Comment:    (NOTE)         Pre-diabetes: 5.7 - 6.4         Diabetes: >6.4         Glycemic control for adults with diabetes: <7.0   01/25/2016 04:32 AM 8.2 (H) 4.0 - 6.0 % Final    CBG:  Recent Labs Lab 03/07/16 1637 03/07/16 1926 03/07/16 2320 04/03/2016 0448 03/30/2016 0759  GLUCAP 140* 73 123* 88 97    Recent Results (from the past 240 hour(s))  Culture, blood (routine x 2)     Status: None (Preliminary result)   Collection Time: 03/03/16  1:11 PM  Result Value Ref Range Status   Specimen Description BLOOD BLOOD RIGHT HAND  Final   Special Requests IN PEDIATRIC BOTTLE 3CC  Final   Culture NO GROWTH 4 DAYS  Final   Report Status PENDING  Incomplete  Culture, blood (routine x 2)     Status: Abnormal   Collection Time: 03/03/16  1:16 PM  Result Value Ref Range Status   Specimen Description BLOOD BLOOD RIGHT ARM  Final   Special Requests IN PEDIATRIC BOTTLE 3CC  Final   Culture  Setup Time   Final    GRAM POSITIVE COCCI IN CLUSTERS AEROBIC BOTTLE ONLY CRITICAL RESULT CALLED TO, READ BACK BY AND VERIFIED WITH: M TURNER 03/04/16 @ 27 M VESTAL    Culture (A)  Final    STAPHYLOCOCCUS SPECIES (COAGULASE NEGATIVE) THE  SIGNIFICANCE OF ISOLATING THIS ORGANISM FROM A SINGLE SET OF BLOOD CULTURES WHEN MULTIPLE SETS ARE DRAWN IS UNCERTAIN. PLEASE NOTIFY THE MICROBIOLOGY DEPARTMENT WITHIN ONE WEEK IF SPECIATION AND SENSITIVITIES ARE REQUIRED.    Report Status 03/06/2016 FINAL  Final  Blood Culture ID Panel (Reflexed)     Status: Abnormal   Collection Time: 03/03/16  1:16 PM  Result Value Ref Range Status   Enterococcus species NOT DETECTED NOT DETECTED Final   Vancomycin resistance NOT DETECTED NOT DETECTED Final   Listeria monocytogenes NOT DETECTED NOT DETECTED Final   Staphylococcus species DETECTED (A) NOT DETECTED Final    Comment: CRITICAL RESULT CALLED TO, READ BACK BY AND VERIFIED WITH: M TURNER 03/04/16 @ 59 M VESTAL    Staphylococcus aureus NOT DETECTED NOT DETECTED Final   Methicillin resistance NOT DETECTED NOT DETECTED Final   Streptococcus species NOT DETECTED NOT DETECTED Final   Streptococcus agalactiae NOT DETECTED NOT DETECTED Final   Streptococcus pneumoniae NOT DETECTED NOT DETECTED Final   Streptococcus pyogenes NOT DETECTED NOT DETECTED Final   Acinetobacter baumannii NOT DETECTED NOT DETECTED Final   Enterobacteriaceae species NOT DETECTED NOT DETECTED Final   Enterobacter cloacae complex NOT DETECTED NOT DETECTED Final   Escherichia coli NOT DETECTED NOT DETECTED Final   Klebsiella oxytoca NOT DETECTED NOT DETECTED Final   Klebsiella pneumoniae NOT DETECTED NOT DETECTED Final   Proteus species NOT DETECTED NOT DETECTED Final   Serratia marcescens NOT DETECTED NOT DETECTED Final   Carbapenem resistance NOT DETECTED NOT DETECTED Final   Haemophilus influenzae NOT DETECTED NOT DETECTED Final   Neisseria meningitidis NOT DETECTED NOT DETECTED Final   Pseudomonas aeruginosa NOT DETECTED NOT DETECTED Final   Candida albicans NOT DETECTED NOT DETECTED Final  Candida glabrata NOT DETECTED NOT DETECTED Final   Candida krusei NOT DETECTED NOT DETECTED Final   Candida parapsilosis NOT  DETECTED NOT DETECTED Final   Candida tropicalis NOT DETECTED NOT DETECTED Final  Culture, respiratory (NON-Expectorated)     Status: None   Collection Time: 03/03/16  1:19 PM  Result Value Ref Range Status   Specimen Description TRACHEAL ASPIRATE  Final   Special Requests NONE  Final   Gram Stain   Final    ABUNDANT WBC PRESENT, PREDOMINANTLY PMN RARE SQUAMOUS EPITHELIAL CELLS PRESENT FEW GRAM NEGATIVE COCCI IN PAIRS RARE GRAM NEGATIVE RODS RARE GRAM POSITIVE COCCI IN PAIRS    Culture MULTIPLE ORGANISMS PRESENT, NONE PREDOMINANT  Final   Report Status 03/05/2016 FINAL  Final     Scheduled Meds: . antiseptic oral rinse  7 mL Mouth Rinse QID  . aspirin  81 mg Oral Daily  . atorvastatin  80 mg Oral q1800  . chlorhexidine gluconate (SAGE KIT)  15 mL Mouth Rinse BID  . darbepoetin (ARANESP) injection - DIALYSIS  200 mcg Intravenous Q Thu-HD  . feeding supplement (ENSURE ENLIVE)  237 mL Oral TID BM  . feeding supplement (NEPRO CARB STEADY)  237 mL Oral BID BM  . ferric gluconate (FERRLECIT/NULECIT) IV  125 mg Intravenous Q T,Th,Sa-HD  . gabapentin  300 mg Oral Q12H  . heparin subcutaneous  5,000 Units Subcutaneous Q8H  . insulin aspart  0-9 Units Subcutaneous Q4H  . ipratropium-albuterol  3 mL Nebulization TID  . isosorbide dinitrate  10 mg Oral TID  . metoprolol tartrate  12.5 mg Oral BID  . multivitamin  1 tablet Oral QHS  . pantoprazole sodium  40 mg Oral Daily  . sodium chloride flush  3 mL Intravenous Q12H     LOS: 21 days    Cherene Altes, MD [facility] Office  [phone] Pager - Text Page per Amion as per below:  On-Call/Text Page:      [email or web address]      password [email or web address]  If 7PM-7AM, please contact night-coverage [email or web address] Password [email or web address] 03/21/2016, 9:31 AM

## 2016-03-08 NOTE — Progress Notes (Signed)
Please see my note from 8/3.  Patient needs GI eval prior to undergoing cath.  He has history of GIB which needs to be worked up prior to cath in case he has to be placed on DAPT for stent.

## 2016-03-08 NOTE — Progress Notes (Signed)
CM called [facility] on [address] as they will accept pt with trach as long as it is capped.  They will also use hoyer lift if pt is unable to transfer from w/c to HD chair.  Pt has been sitting on side of bed today x 2 hrs.

## 2016-03-09 LAB — CBC
HCT: 35.2 % — ABNORMAL LOW (ref 39.0–52.0)
Hemoglobin: 10.2 g/dL — ABNORMAL LOW (ref 13.0–17.0)
MCH: 27.8 pg (ref 26.0–34.0)
MCHC: 29 g/dL — AB (ref 30.0–36.0)
MCV: 95.9 fL (ref 78.0–100.0)
PLATELETS: 167 10*3/uL (ref 150–400)
RBC: 3.67 MIL/uL — ABNORMAL LOW (ref 4.22–5.81)
RDW: 20 % — AB (ref 11.5–15.5)
WBC: 5.2 10*3/uL (ref 4.0–10.5)

## 2016-03-09 LAB — GLUCOSE, CAPILLARY
GLUCOSE-CAPILLARY: 166 mg/dL — AB (ref 65–99)
GLUCOSE-CAPILLARY: 99 mg/dL (ref 65–99)
GLUCOSE-CAPILLARY: 121 mg/dL — AB (ref 65–99)
Glucose-Capillary: 172 mg/dL — ABNORMAL HIGH (ref 65–99)
Glucose-Capillary: 174 mg/dL — ABNORMAL HIGH (ref 65–99)

## 2016-03-09 MED ORDER — IPRATROPIUM-ALBUTEROL 0.5-2.5 (3) MG/3ML IN SOLN
3.0000 mL | Freq: Two times a day (BID) | RESPIRATORY_TRACT | Status: DC
  Administered 2016-03-09 – 2016-03-14 (×10): 3 mL via RESPIRATORY_TRACT
  Filled 2016-03-09 (×10): qty 3

## 2016-03-09 NOTE — Progress Notes (Signed)
[facility] Brief Prog Note  Took pt to HD, had issues with cannulation of his AVF Attempted unsuccessfully to recannulate. Had an infiltration earlier in the week. Will need to try again tomorrow. Will need f'gram if unable to cannulate tomorrow, as well as possible catheter  Camille Bal, MD [facility] [phone] Pager 03/09/2016, 3:04 PM

## 2016-03-09 NOTE — Progress Notes (Signed)
SLP Cancellation Note  Patient Details Name: Tanner Campbell MRN: [id number] DOB: 03-Feb-1976   Cancelled treatment:       Reason Eval/Treat Not Completed: Other (comment) (Pt in dialysis.  )  Dimas Aguas, MA, CCC-SLP Acute Rehab SLP [phone] Fleet Contras 03/09/2016, 1:34 PM

## 2016-03-09 NOTE — Progress Notes (Signed)
[place] TEAM 1 - Stepdown/ICU TEAM  Tanner Campbell  UEA:[id number] DOB: 08/21/1975 DOA: 02/12/2016 PCP: No primary care provider on file.    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  He decompensated 6/23 and required intubation. The patient was found to have H. Influenza positive sputum and treated with Rocephin. CXR demonstrated a large right pleural effusion and he underwent a thoracentesis (1L serous fluid removed - exudative by protein). He had difficulty with weaning and was transferred to [facility] on 6/29 orally intubated for ventilator weaning. The patient developed fever and antibiotics were expanded to vancomycin, cefepime and diflucan. He had progressed to weaning on PSV x 4 hours as of 7/3. He self extubated on 7/5 but required BiPAP off and on thereafter. After HD 7/10his mental status worsened and he became minimally responsive. CXR was consistent with RLL opacification and he was re-intubated. He was re-cultured and also found to have increased Troponin for which Cardiology was consulted. He was placed on LMWH, but had a GIB while on this w/ his hgb dropping as low as 8.4.  On 7/14 he remained ventilator dependent. His sputum cultures grew MSSA and E coli and he was noted to have + blood cultures. Because of these acutely worsening issues it was felt best to transfer him to the ICU at [facility].   Significant Events: 6/21 admitted at [facility] 6/29 transfer to [facility] 7/14 transfer from [facility] to [facility] 7/18 trach placed  Subjective: The pt is alert and conversant.  He denies cp, sob, n/v, or abdom pain.  He asks when he will have his EGD and his cardiac cath.    Assessment & Plan:  Acute hypoxic respiratory failure - s/p trach 7/18 - HCAP Ventilator management and trach care per PCCM - has completed a course of  cefepime - currently tolerating trach collar without difficulty - respiratory status is the most stable it has been since his admit to [facility] - PCCM considering trials w/ capping of trach next week   Coag negative Staph species in blood cx Blood cx obtained 7/30 1 of 2 + - pt does not appear toxic - afeb w/ normal WBC - most consistent with contaminant  Bacteremia with Veillonella species (Gram negative rod) completed Flagyl x 2 weeks for bacteremia as per ID recs  Bilateral pleural effusions s/p thoracentesis Stable on follow-up chest x-ray 7/21 - no respiratory distress w/ stable oxygenation  NSTEMI vs demand ischemia nuclear medicine stress test noted basal inferior and mid inferior perfusion defecst - Cards following again and planning for cardiac cath after GI eval completed   Small pericardial effusion with friction rub - resolved  ANA negative - TEE without evidence of endocarditis/vegetations - hemodynamically stable - rub has resolved  Mild to moderate aortic regurgitation Hemodynamically stable   ESRD on HD Tue/Th/Sat Nephrology following   Reported recent GIB on LMWH No evidence of acute blood loss a this time - this occurred while the pt was at [facility] when lovenox was begun during and episode of CP   Anemia of chronic kidney disease + acute blood loss  No evidence of blood loss, but Hgb drifted down to 7.1 - s/p 1U PRBC 7/24 - Hgb has improved as expected and now appears stable - no clinical evidence of GIB at this  time - reports of acute blood loss date back to [facility] when pt was placed on lovenox during episode of CP (no records available in Epic)  Recent Labs Lab 03/04/16 0343 03/05/16 0334 03/07/16 0845 03/09/2016 0351 03/09/16 0431  HGB 10.1* 10.2* 8.8* 9.3* 10.2*    DM type II w/ hyperglycemia  CBG stable   Acute Encephalopathy in setting of critical illness Resolved  - pt clearly is presently A&O and possesses capacity to make his own decisions   Severe  malnutrition in context of acute illness Patient is much more alert and has now been cleared for diet which he is tolerating well   Dysphagia Cleared for modified diet by SLP  Disposition Planning  Pending - difficult to place due to trach AND ESRD on HD - pt now has capacity to make his own decisions regarding placement post hospital   DVT prophylaxis: SQ heparin  Code Status: FULL CODE Family Communication: no family present at time of exam  Disposition Plan: SDU   Consultants:  PCCM Cardiology  ID Nephrology  ENT  Antimicrobials:  Cipro 7/12>7/14 Vanc 7/2>7/17 Cefepime 7/14>7/21 Fluconazole 7/7>7/17 Flagyl 7/17 > 7/31  Objective: Blood pressure (!) 144/78, pulse 86, temperature 98 F (36.7 C), temperature source Oral, resp. rate 16, height 5' 5"  (1.651 m), weight 56.3 kg (124 lb 1.9 oz), SpO2 100 %.  Intake/Output Summary (Last 24 hours) at 03/09/16 1613 Last data filed at 03/09/16 0939  Gross per 24 hour  Intake              363 ml  Output               30 ml  Net              333 ml   Filed Weights   03/17/2016 0455 03/09/16 0414 03/09/16 1310  Weight: 54.4 kg (120 lb) 57.6 kg (127 lb) 56.3 kg (124 lb 1.9 oz)    Examination: General: No acute respiratory distress - appears comfortable Lungs: no wheeze or focal crackles - on TC w/ PMV in place and phonating  Cardiovascular: Regular rate and rhythm  Abdomen: Nondistended, soft, bowel sounds positive, no rebound  Extremities: No significant cyanosis, clubbing, or edema B LE   CBC:  Recent Labs Lab 03/03/16 1306 03/04/16 0343 03/05/16 0334 03/07/16 0845 04/03/2016 0351 03/09/16 0431  WBC 7.0 7.7 6.2 5.6 5.4 5.2  NEUTROABS 5.6 5.1  --   --   --   --   HGB 10.0* 10.1* 10.2* 8.8* 9.3* 10.2*  HCT 32.7* 34.2* 34.9* 29.2* 32.1* 35.2*  MCV 92.9 93.2 94.1 92.1 95.3 95.9  PLT 213 221 217 191 169 381   Basic Metabolic Panel:  Recent Labs Lab 03/03/16 1306 03/04/16 0343 03/05/16 0644 03/07/16 0834  NA  134* 135 132* 135  135  K 3.3* 4.0 4.1 3.7  3.6  CL 91* 93* 91* 92*  92*  CO2 31 32 29 31  31   GLUCOSE 147* 127* 72 101*  105*  BUN 20 29* 47* 42*  41*  CREATININE 2.62* 3.11* 3.98* 4.94*  4.96*  CALCIUM 8.3* 8.6* 8.5* 8.6*  8.7*  MG 2.1 2.3  --  2.4  PHOS  --   --  4.6 4.9*     GFR: Estimated Creatinine Clearance: 15.6 mL/min (by C-G formula based on SCr of 4.96 mg/dL).  Liver Function Tests:  Recent Labs Lab 03/05/16 0644 03/07/16 0834  ALBUMIN 1.9* 2.1*    Cardiac Enzymes:  Recent Labs Lab 03/02/16 2100 03/03/16 0814 03/04/16 1615 03/04/16 2131 03/05/16 0334  TROPONINI 0.07* 0.07*  0.07* 0.06* 0.05* 0.07*    HbA1C: Hgb A1c MFr Bld  Date/Time Value Ref Range Status  02/02/2016 06:50 AM 7.7 (H) 4.8 - 5.6 % Final    Comment:    (NOTE)         Pre-diabetes: 5.7 - 6.4         Diabetes: >6.4         Glycemic control for adults with diabetes: <7.0   01/25/2016 04:32 AM 8.2 (H) 4.0 - 6.0 % Final    CBG:  Recent Labs Lab 03/30/2016 2345 03/09/16 0502 03/09/16 0742 03/09/16 1143 03/09/16 1542  GLUCAP 139* 166* 99 172* 121*    Recent Results (from the past 240 hour(s))  Culture, blood (routine x 2)     Status: None   Collection Time: 03/03/16  1:11 PM  Result Value Ref Range Status   Specimen Description BLOOD BLOOD RIGHT HAND  Final   Special Requests IN PEDIATRIC BOTTLE 3CC  Final   Culture NO GROWTH 5 DAYS  Final   Report Status 03/24/2016 FINAL  Final  Culture, blood (routine x 2)     Status: Abnormal   Collection Time: 03/03/16  1:16 PM  Result Value Ref Range Status   Specimen Description BLOOD BLOOD RIGHT ARM  Final   Special Requests IN PEDIATRIC BOTTLE 3CC  Final   Culture  Setup Time   Final    GRAM POSITIVE COCCI IN CLUSTERS AEROBIC BOTTLE ONLY CRITICAL RESULT CALLED TO, READ BACK BY AND VERIFIED WITH: M TURNER 03/04/16 @ 48 M VESTAL    Culture (A)  Final    STAPHYLOCOCCUS SPECIES (COAGULASE NEGATIVE) THE SIGNIFICANCE OF  ISOLATING THIS ORGANISM FROM A SINGLE SET OF BLOOD CULTURES WHEN MULTIPLE SETS ARE DRAWN IS UNCERTAIN. PLEASE NOTIFY THE MICROBIOLOGY DEPARTMENT WITHIN ONE WEEK IF SPECIATION AND SENSITIVITIES ARE REQUIRED.    Report Status 03/06/2016 FINAL  Final  Blood Culture ID Panel (Reflexed)     Status: Abnormal   Collection Time: 03/03/16  1:16 PM  Result Value Ref Range Status   Enterococcus species NOT DETECTED NOT DETECTED Final   Vancomycin resistance NOT DETECTED NOT DETECTED Final   Listeria monocytogenes NOT DETECTED NOT DETECTED Final   Staphylococcus species DETECTED (A) NOT DETECTED Final    Comment: CRITICAL RESULT CALLED TO, READ BACK BY AND VERIFIED WITH: M TURNER 03/04/16 @ 25 M VESTAL    Staphylococcus aureus NOT DETECTED NOT DETECTED Final   Methicillin resistance NOT DETECTED NOT DETECTED Final   Streptococcus species NOT DETECTED NOT DETECTED Final   Streptococcus agalactiae NOT DETECTED NOT DETECTED Final   Streptococcus pneumoniae NOT DETECTED NOT DETECTED Final   Streptococcus pyogenes NOT DETECTED NOT DETECTED Final   Acinetobacter baumannii NOT DETECTED NOT DETECTED Final   Enterobacteriaceae species NOT DETECTED NOT DETECTED Final   Enterobacter cloacae complex NOT DETECTED NOT DETECTED Final   Escherichia coli NOT DETECTED NOT DETECTED Final   Klebsiella oxytoca NOT DETECTED NOT DETECTED Final   Klebsiella pneumoniae NOT DETECTED NOT DETECTED Final   Proteus species NOT DETECTED NOT DETECTED Final   Serratia marcescens NOT DETECTED NOT DETECTED Final   Carbapenem resistance NOT DETECTED NOT DETECTED Final   Haemophilus influenzae NOT DETECTED NOT DETECTED Final   Neisseria meningitidis NOT DETECTED NOT DETECTED Final   Pseudomonas aeruginosa NOT DETECTED NOT DETECTED Final   Candida albicans NOT DETECTED NOT DETECTED Final  Candida glabrata NOT DETECTED NOT DETECTED Final   Candida krusei NOT DETECTED NOT DETECTED Final   Candida parapsilosis NOT DETECTED NOT  DETECTED Final   Candida tropicalis NOT DETECTED NOT DETECTED Final  Culture, respiratory (NON-Expectorated)     Status: None   Collection Time: 03/03/16  1:19 PM  Result Value Ref Range Status   Specimen Description TRACHEAL ASPIRATE  Final   Special Requests NONE  Final   Gram Stain   Final    ABUNDANT WBC PRESENT, PREDOMINANTLY PMN RARE SQUAMOUS EPITHELIAL CELLS PRESENT FEW GRAM NEGATIVE COCCI IN PAIRS RARE GRAM NEGATIVE RODS RARE GRAM POSITIVE COCCI IN PAIRS    Culture MULTIPLE ORGANISMS PRESENT, NONE PREDOMINANT  Final   Report Status 03/05/2016 FINAL  Final     Scheduled Meds: . antiseptic oral rinse  7 mL Mouth Rinse QID  . aspirin  81 mg Oral Daily  . atorvastatin  80 mg Oral q1800  . chlorhexidine gluconate (SAGE KIT)  15 mL Mouth Rinse BID  . darbepoetin (ARANESP) injection - DIALYSIS  200 mcg Intravenous Q Thu-HD  . feeding supplement (ENSURE ENLIVE)  237 mL Oral TID BM  . feeding supplement (NEPRO CARB STEADY)  237 mL Oral BID BM  . ferric gluconate (FERRLECIT/NULECIT) IV  125 mg Intravenous Q T,Th,Sa-HD  . gabapentin  300 mg Oral Q12H  . heparin subcutaneous  5,000 Units Subcutaneous Q8H  . insulin aspart  0-9 Units Subcutaneous Q4H  . ipratropium-albuterol  3 mL Nebulization BID  . isosorbide dinitrate  10 mg Oral TID  . metoprolol tartrate  12.5 mg Oral BID  . multivitamin  1 tablet Oral QHS  . pantoprazole sodium  40 mg Oral Daily  . sodium chloride flush  3 mL Intravenous Q12H     LOS: 22 days    Cherene Altes, MD [facility] Office  [phone] Pager - Text Page per Amion as per below:  On-Call/Text Page:      [email or web address]      password [email or web address]  If 7PM-7AM, please contact night-coverage [email or web address] Password [email or web address] 03/09/2016, 4:13 PM

## 2016-03-09 NOTE — Progress Notes (Signed)
Noted that patient's nourishment supplement is coming out on this trach.  Advise not to drink until MD respond.

## 2016-03-09 NOTE — Progress Notes (Signed)
[facility] Rounding Note  Subjective/Interval History:   Sitting up on the edge of the bed (he says since breakfast) but wants to lie back down Says SOB and has junky cough  Objective: Vital signs in last 24 hours: Temp:  [98 F (36.7 C)-98.7 F (37.1 C)] 98 F (36.7 C) (08/05 0743) Pulse Rate:  [78-95] 84 (08/05 0743) Resp:  [11-22] 14 (08/05 0743) BP: (95-141)/(53-88) 95/61 (08/05 0743) SpO2:  [94 %-100 %] 100 % (08/05 0743) FiO2 (%):  [28 %] 28 % (08/05 0743) Weight:  [57.6 kg (127 lb)] 57.6 kg (127 lb) (08/05 0414) Weight change: 1.307 kg (2 lb 14.1 oz)   Physical Examination BP 95/61 (BP Location: Right Arm)   Pulse 84   Temp 98 F (36.7 C) (Oral)   Resp 14   Ht 5' 5"  (1.651 m)   Wt 57.6 kg (127 lb)   SpO2 100%   BMI 21.13 kg/m   Frail young man Sitting edge of bed Trach collar Lungs with coarse rhonchi bilaterally Cardiac rhythm regular S1S2 No s3 Abd soft + BS No LE edema Left upper arm AVF with + bruit and thrill, bruising from prior infiltration   Lab Results:  Recent Labs  03/24/2016 0351 03/09/16 0431  WBC 5.4 5.2  HGB 9.3* 10.2*  HCT 32.1* 35.2*  PLT 169 167    Recent Labs  03/07/16 0834  NA 135  135  K 3.7  3.6  CL 92*  92*  CO2 31  31  GLUCOSE 101*  105*  BUN 42*  41*  CREATININE 4.94*  4.96*  CALCIUM 8.6*  8.7*  Phosphorus                         4.6  Current Medications . antiseptic oral rinse  7 mL Mouth Rinse QID  . aspirin  81 mg Oral Daily  . atorvastatin  80 mg Oral q1800  . chlorhexidine gluconate (SAGE KIT)  15 mL Mouth Rinse BID  . darbepoetin (ARANESP) injection - DIALYSIS  200 mcg Intravenous Q Thu-HD  . feeding supplement (ENSURE ENLIVE)  237 mL Oral TID BM  . feeding supplement (NEPRO CARB STEADY)  237 mL Oral BID BM  . ferric gluconate (FERRLECIT/NULECIT) IV  125 mg Intravenous Q T,Th,Sa-HD  . gabapentin  300 mg Oral Q12H  . heparin subcutaneous  5,000 Units Subcutaneous Q8H  . insulin aspart  0-9 Units  Subcutaneous Q4H  . ipratropium-albuterol  3 mL Nebulization BID  . isosorbide dinitrate  10 mg Oral TID  . metoprolol tartrate  12.5 mg Oral BID  . multivitamin  1 tablet Oral QHS  . pantoprazole sodium  40 mg Oral Daily  . sodium chloride flush  3 mL Intravenous Q12H    Background: 40 yo with DM, HTN, HLD, MS, COPD. ESRD on HD since 04/2014 via AVF. Acute resp failure 01/2015 [facility] with AMS, recurrent acute hypoxic resp failure, + trops - NSTEMI vs demand ischemia, GIB, bacteremia (Veillonella; new GPC 7/30 1/2). We were asked to provide his TTS HD  Dialysis prescription (current in the hospital) TTS 4 hours 4K 2.25 Ca No heparin Expert cannulator for L AVF 17 ga needles 56 kg EDW (est)   Assessment/Plan:  1. ESRD  TTS [facility] (and [facility] For HD today. Will need to be able to HD in chair for outpt. Maybe can start that on Tuesday. 2. Chest pain. NSTEMI. Probable CAD with reversible  defect inf and inferoseptal on nuclear stress test that corresponds to area of WMA on ECHO. Reduced EF 30-44%. Cardiology wants to do left heart cath eventually - their note reviewed. They want GI tto workup. Appears may be getting EGD on Monday? With consideration of colonoscopy  3. Mild to moderate AI 4. Acute on chronic resp failure - per CCM. On trach collar. No decannulation unti more physically fit per CCM. 5. Bilateral pleural effusions - s/p thoracentesis T 6/28 1.1 L. BP has limited getting dry enough to help w/pleural effusions (still seen on radiograph 7/30) 6. Bacteremias (veillonella, GCP clusters 7/30) - s/p cefipime, and finished 14 days flagyl.  No new Rx for GPC as felt to be contaminant 7. Anemia - CKD + ? BL. S/p PRBC's X 1 on 7/25, 7/27. Getting darbe 200 + ferrlecit. Hb up to 10.2 8. Severe malnutrition - per primary service. Eating diet now plus Nepro. 9. Disposition - CM called [facility] on [address] [place]) who indicated they will  accept pt with trach as long as it is capped.  They will also use hoyer lift if pt is unable to transfer from w/c to HD chair. He WILL however have to be able to sit in the chair for his HD treatments.  Jamal Maes, MD [facility] [phone] Pager 03/09/2016, 9:26 AM

## 2016-03-10 LAB — RENAL FUNCTION PANEL
ANION GAP: 12 (ref 5–15)
Albumin: 1.9 g/dL — ABNORMAL LOW (ref 3.5–5.0)
BUN: 37 mg/dL — ABNORMAL HIGH (ref 6–20)
CALCIUM: 8.8 mg/dL — AB (ref 8.9–10.3)
CHLORIDE: 101 mmol/L (ref 101–111)
CO2: 25 mmol/L (ref 22–32)
Creatinine, Ser: 5.25 mg/dL — ABNORMAL HIGH (ref 0.61–1.24)
GFR calc non Af Amer: 12 mL/min — ABNORMAL LOW (ref >60)
GFR, EST AFRICAN AMERICAN: 14 mL/min — AB (ref >60)
Glucose, Bld: 78 mg/dL (ref 65–99)
PHOSPHORUS: 4.1 mg/dL (ref 2.5–4.6)
POTASSIUM: 5.4 mmol/L — AB (ref 3.5–5.1)
Sodium: 138 mmol/L (ref 135–145)

## 2016-03-10 LAB — GLUCOSE, CAPILLARY
GLUCOSE-CAPILLARY: 138 mg/dL — AB (ref 65–99)
GLUCOSE-CAPILLARY: 97 mg/dL (ref 65–99)
GLUCOSE-CAPILLARY: 115 mg/dL — AB (ref 65–99)
GLUCOSE-CAPILLARY: 144 mg/dL — AB (ref 65–99)
Glucose-Capillary: 139 mg/dL — ABNORMAL HIGH (ref 65–99)
Glucose-Capillary: 158 mg/dL — ABNORMAL HIGH (ref 65–99)
Glucose-Capillary: 161 mg/dL — ABNORMAL HIGH (ref 65–99)

## 2016-03-10 MED ORDER — GABAPENTIN 600 MG PO TABS
300.0000 mg | ORAL_TABLET | Freq: Two times a day (BID) | ORAL | Status: DC
  Administered 2016-03-10 – 2016-03-22 (×22): 300 mg via ORAL
  Filled 2016-03-10 (×24): qty 1

## 2016-03-10 MED ORDER — MORPHINE SULFATE (PF) 2 MG/ML IV SOLN
INTRAVENOUS | Status: AC
  Filled 2016-03-10: qty 1

## 2016-03-10 MED ORDER — METOPROLOL TARTRATE 25 MG PO TABS
25.0000 mg | ORAL_TABLET | Freq: Two times a day (BID) | ORAL | Status: DC
  Administered 2016-03-10 – 2016-03-22 (×20): 25 mg via ORAL
  Filled 2016-03-10 (×24): qty 1

## 2016-03-10 MED ORDER — METOPROLOL TARTRATE 25 MG/10 ML ORAL SUSPENSION: 25.0000 mg | Freq: Two times a day (BID) | ORAL | Status: DC

## 2016-03-10 MED ORDER — PANTOPRAZOLE SODIUM 40 MG PO TBEC
40.0000 mg | DELAYED_RELEASE_TABLET | Freq: Every day | ORAL | Status: DC
  Administered 2016-03-11 – 2016-03-22 (×11): 40 mg via ORAL
  Filled 2016-03-10 (×11): qty 1

## 2016-03-10 NOTE — Progress Notes (Signed)
Patient is currently off the unit for hemodialysis.

## 2016-03-10 NOTE — Procedures (Signed)
I have personally attended this patient's dialysis session. Cannulated with 17 ga needles and running at BFR 300 2K bath for K  5.4  Camille Bal, MD [facility] [phone] Pager 03/10/2016, 4:03 PM

## 2016-03-10 NOTE — Progress Notes (Signed)
No coughing episode noted after meals.  Encouraged patient to thicken his drinks and he did really well.  He thicken his own drink.  Will continue to monitor PO intake.

## 2016-03-10 NOTE — Progress Notes (Signed)
Patient Name: Tanner Campbell Date of Encounter: 03/10/2016  Subjective   Denies CP or dyspnea  Inpatient Medications    . antiseptic oral rinse  7 mL Mouth Rinse QID  . aspirin  81 mg Oral Daily  . atorvastatin  80 mg Oral q1800  . chlorhexidine gluconate (SAGE KIT)  15 mL Mouth Rinse BID  . darbepoetin (ARANESP) injection - DIALYSIS  200 mcg Intravenous Q Thu-HD  . feeding supplement (ENSURE ENLIVE)  237 mL Oral TID BM  . feeding supplement (NEPRO CARB STEADY)  237 mL Oral BID BM  . ferric gluconate (FERRLECIT/NULECIT) IV  125 mg Intravenous Q T,Th,Sa-HD  . gabapentin  300 mg Oral Q12H  . heparin subcutaneous  5,000 Units Subcutaneous Q8H  . insulin aspart  0-9 Units Subcutaneous Q4H  . ipratropium-albuterol  3 mL Nebulization BID  . isosorbide dinitrate  10 mg Oral TID  . metoprolol tartrate  12.5 mg Oral BID  . multivitamin  1 tablet Oral QHS  . pantoprazole sodium  40 mg Oral Daily  . sodium chloride flush  3 mL Intravenous Q12H    Vital Signs    Vitals:   03/10/16 0742 03/10/16 0743 03/10/16 1017 03/10/16 1126  BP:   (!) 161/80 135/68  Pulse:  91 86 77  Resp:  15  10  Temp:    98.7 F (37.1 C)  TempSrc:    Oral  SpO2: 97% 96%  97%  Weight:      Height:        Intake/Output Summary (Last 24 hours) at 03/10/16 1153 Last data filed at 03/10/16 1018  Gross per 24 hour  Intake              363 ml  Output                0 ml  Net              363 ml   Filed Weights   03/09/16 0414 03/09/16 1310 03/10/16 0500  Weight: 127 lb (57.6 kg) 124 lb 1.9 oz (56.3 kg) 121 lb (54.9 kg)    Physical Exam    General: chronically ill-appearing, frail male, NAD. Neuro: Alert and oriented X 3. Moves all extremities spontaneously. HEENT:  Normal Neck: Supple, trach in place  Lungs:  Mild rhonchi Heart: RRR Abdomen: Soft, non-tender, non-distended, BS + x 4.  Extremities: No edema.   Labs    CBC  Recent Labs  03/10/2016 0351 03/09/16 0431  WBC 5.4 5.2  HGB 9.3*  10.2*  HCT 32.1* 35.2*  MCV 95.3 95.9  PLT 169 903   Basic Metabolic Panel  Recent Labs  03/10/16 0712  NA 138  K 5.4*  CL 101  CO2 25  GLUCOSE 78  BUN 37*  CREATININE 5.25*  CALCIUM 8.8*  PHOS 4.1   Liver Function Tests  Recent Labs  03/10/16 0712  ALBUMIN 1.9*     Telemetry    SR   Radiology    Nm Myocar Multi W/spect W/wall Motion / Ef  Result Date: 03/06/2016  There was no ST segment deviation noted during stress.  Defect 1: There is a medium defect of severe severity present in the basal inferior and mid inferior location.  Findings consistent with prior myocardial infarction.  This is an intermediate risk study.  The left ventricular ejection fraction is moderately decreased (30-44%).  Global hypokinesis worse in the inferior and inferoseptal regions.     Assessment &  Plan    Pt Profile: Mr. Westrup is 40 yo male with PMH of HTN, HLD, DM, Multiple sclerosis, COPD and ESRD on HD since 05/02/2014 presented with acute resp distress, failed multiple extubation attempt. Acute respiratory failure felt to be due to a combination of Haemophilus pneumonia and volume overload. Patient missed at least 3 episodes of hemodialysis before presentation, nephrology managing hemodialysis. Found to have mildly elevated trop on 7/7-7/8.  Cardiology consulted 7/10 for elevated troponin and EKG changes. An echocardiogram 02/07/16 showed mildly reduced LVEF of 45-50% however there is akinesis of the mid inferior and inferoseptal myocardium, mild aortic insufficiency and a small pericardial effusion. A repeat EKG 7/13 showed worsening deep T-wave inversions in the antero-lateral leads with high voltage as well as inferior T-wave inversions. Advice to avoid ASA in setting of acute GI bleed.   Echo done 02/15/16 showed slightly worse LV function with focal inferior wall motion abnormality, which could suggest severe ischemia or infarct in this territory. There is a small pericardial  effusion which is stable. Resumed ASA. He experienced acute encephalopathy on 02/24/2016 and cardiac work up was deferred until the patient was more stable.   He underwent a Lexiscan myoview on 03/06/2016 which resulted as intermediate risk with a medium defect of severe severity present in the basal inferior and mid inferior location. EF noted at 30-44%.   1. Elevated troponin: Troponin mildly elevated which could be demand ischemia but patient had some CP in the hospital.  Nuclear stress test with reversible defect in the inferior and inferoseptal region corresponding to wall motion abnormality seen on echo.  He is very debilitated but overall improved. Per Dr Radford Pax, plan is for cath once GI evaluation complete. Continue ASA, statin and lopressor (increase to 25 mg BID).  2.  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 EF 45-50% by echo and 30-44% by nuclear stress test.  3.  ESRD on HD per nephrology    Signed, Fransico Him, MD [facility] 03/07/2016

## 2016-03-10 NOTE — Progress Notes (Addendum)
[facility] Rounding Note  Subjective/Interval History:   Unable to get HD yesterday Had issues with spasm around arterial needle, and then complained of pain with even light touch and would not allow recannulation Today complains of pain when I lay my stethescope or hand on his AVF - but I find no evidence for hematoma in the lower aspect where usually cannulate (has a shrinking hematoma more proximally that is much better than at the beginning of the week  Objective: Vital signs in last 24 hours: Temp:  [97.5 F (36.4 C)-98.4 F (36.9 C)] 98.3 F (36.8 C) (08/06 0700) Pulse Rate:  [73-91] 86 (08/06 1017) Resp:  [9-16] 15 (08/06 0743) BP: (99-161)/(59-80) 161/80 (08/06 1017) SpO2:  [96 %-100 %] 96 % (08/06 0743) FiO2 (%):  [28 %] 28 % (08/06 0743) Weight:  [54.9 kg (121 lb)-56.3 kg (124 lb 1.9 oz)] 54.9 kg (121 lb) (08/06 0500) Weight change: -1.307 kg (-2 lb 14.1 oz)   Physical Examination BP (!) 161/80   Pulse 86   Temp 98.3 F (36.8 C) (Oral)   Resp 15   Ht _0  (1.651 m)   Wt 54.9 kg (121 lb)   SpO2 96%   BMI 20.14 kg/m   Frail young man Sitting edge of bed Trach collar Lungs with coarse rhonchi bilaterally Cardiac rhythm regular S1S2 No s3 Abd soft + BS No LE edema Left upper arm AVF with + bruit and thrill, complains of pain when I lay my stethescope or hand on his AVF - but I find no evidence for hematoma in the lower aspect where usually cannulate (has a shrinking hematoma more proximally that is much better than at the beginning of the week   Lab Results:  Recent Labs  03/17/2016 0351 03/09/16 0431  WBC 5.4 5.2  HGB 9.3* 10.2*  HCT 32.1* 35.2*  PLT 169 167    Recent Labs  03/10/16 0712  NA 138  K 5.4*  CL 101  CO2 25  GLUCOSE 78  BUN 37*  CREATININE 5.25*  CALCIUM 8.8*  Phosphorus                         4.6  Current Medications . antiseptic oral rinse  7 mL Mouth Rinse QID  . aspirin  81 mg Oral Daily  . atorvastatin  80 mg Oral q1800  .  chlorhexidine gluconate (SAGE KIT)  15 mL Mouth Rinse BID  . darbepoetin (ARANESP) injection - DIALYSIS  200 mcg Intravenous Q Thu-HD  . feeding supplement (ENSURE ENLIVE)  237 mL Oral TID BM  . feeding supplement (NEPRO CARB STEADY)  237 mL Oral BID BM  . ferric gluconate (FERRLECIT/NULECIT) IV  125 mg Intravenous Q T,Th,Sa-HD  . gabapentin  300 mg Oral Q12H  . heparin subcutaneous  5,000 Units Subcutaneous Q8H  . insulin aspart  0-9 Units Subcutaneous Q4H  . ipratropium-albuterol  3 mL Nebulization BID  . isosorbide dinitrate  10 mg Oral TID  . metoprolol tartrate  12.5 mg Oral BID  . multivitamin  1 tablet Oral QHS  . pantoprazole sodium  40 mg Oral Daily  . sodium chloride flush  3 mL Intravenous Q12H    Background: 40 yo with DM, HTN, HLD, MS, COPD. ESRD on HD since 04/2014 via AVF. Acute resp failure 01/2016 [facility] with AMS, recurrent acute hypoxic resp failure, + trops - NSTEMI vs demand ischemia, GIB, bacteremia (Veillonella; new GPC 7/30 1/2). We were asked  to provide his TTS HD  Dialysis prescription (current in the hospital) TTS 4 hours 4K 2.25 Ca No heparin Expert cannulator for L AVF 17 ga needles 56 kg EDW (est)   Assessment/Plan:  1. ESRD  TTS [place] (and PTA in [facility]).Unable to get HD yesterday - see above ntoes. K up some. will try again to recannulate, use ethyl chloride spray. His pain reporting is very disproprortionate to his exam findings. If unable to cannulate, will need fistulagram - and kayexalate for his hyperkalemia. 2. Chest pain. NSTEMI. 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 Reduced EF 30-44%. Cardiology wants to do left heart cath eventually - their note reviewed. They want GI to workup first.  3. GI - Appears may be getting EGD on Monday? With consideration of colonoscopy  4. Mild to moderate AI 5. Acute on chronic resp failure -  per CCM. On trach collar. No decannulation unti more physically fit per CCM. 6. Bilateral pleural effusions - s/p thoracentesis T 6/28 1.1 L. BP has limited getting dry enough to help w/pleural effusions (still seen on radiograph 7/30) 7. Bacteremias (veillonella, GCP clusters 7/30) - s/p cefipime, and finished 14 days flagyl.  No new Rx for GPC as felt to be contaminant 8. Anemia - CKD + ? BL. S/p PRBC's X 1 on 7/25, 7/27. Getting darbe 200 + ferrlecit. Hb up to 10.2 9. Severe malnutrition - per primary service. Eating diet now plus Nepro. 10. Disposition - CM called [facility] on [address] [place]) who indicated they will accept pt with trach as long as it is capped.  They will also use hoyer lift if pt is unable to transfer from w/c to HD chair. He WILL however have to be able to sit in the chair for his HD treatments.  Jamal Maes, MD [facility] [phone] Pager 03/10/2016, 10:43 AM

## 2016-03-10 NOTE — Progress Notes (Signed)
[place] TEAM 1 - Stepdown/ICU TEAM  Tanner Campbell  PPJ:[id number] DOB: Jul 12, 1976 DOA: 02/15/2016 PCP: No primary care provider on file.    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  He decompensated 6/23 and required intubation. The patient was found to have H. Influenza positive sputum and treated with Rocephin. CXR demonstrated a large right pleural effusion and he underwent a thoracentesis (1L serous fluid removed - exudative by protein). He had difficulty with weaning and was transferred to [facility] on 6/29 orally intubated for ventilator weaning. The patient developed fever and antibiotics were expanded to vancomycin, cefepime and diflucan. He had progressed to weaning on PSV x 4 hours as of 7/3. He self extubated on 7/5 but required BiPAP off and on thereafter. After HD 7/10his mental status worsened and he became minimally responsive. CXR was consistent with RLL opacification and he was re-intubated. He was re-cultured and also found to have increased Troponin for which Cardiology was consulted. He was placed on LMWH, but had a GIB while on this w/ his hgb dropping as low as 8.4.  On 7/14 he remained ventilator dependent. His sputum cultures grew MSSA and E coli and he was noted to have + blood cultures. Because of these acutely worsening issues it was felt best to transfer him to the ICU at [facility].   Significant Events: 6/21 admitted at [facility] 6/29 transfer to [facility] 7/14 transfer from [facility] to [facility] 7/18 trach placed  Subjective: The pt is resting comfortably.    Assessment & Plan:  Acute hypoxic respiratory failure - s/p trach 7/18 - HCAP Ventilator management and trach care per PCCM - has completed a course of cefepime - currently tolerating trach collar without difficulty - respiratory status is the most stable  it has been since his admit to [facility] - PCCM considering trials w/ capping of trach next week   Coag negative Staph species in blood cx Blood cx obtained 7/30 1 of 2 + - pt does not appear toxic - afeb w/ normal WBC - most consistent with contaminant  Bacteremia with Veillonella species (Gram negative rod) completed Flagyl x 2 weeks for bacteremia as per ID recs  Bilateral pleural effusions s/p thoracentesis Stable on follow-up chest x-ray 7/21 - no respiratory distress w/ stable oxygenation  NSTEMI vs demand ischemia nuclear medicine stress test noted basal inferior and mid inferior perfusion defecst - Cards following again and planning for cardiac cath after GI eval completed   Small pericardial effusion with friction rub - resolved  ANA negative - TEE without evidence of endocarditis/vegetations - hemodynamically stable - rub has resolved  Mild to moderate aortic regurgitation Hemodynamically stable   ESRD on HD Tue/Th/Sat Nephrology following   Reported recent GIB on LMWH No evidence of acute blood loss a this time - this occurred while the pt was at [facility] when lovenox was begun during an episode of CP   Anemia of chronic kidney disease + acute blood loss  No evidence of blood loss, but Hgb drifted down to 7.1 - s/p 1U PRBC 7/24 - Hgb has improved as expected and now appears stable - no clinical evidence of GIB at this time - reports of acute blood loss date back to [facility] when pt was placed on lovenox during episode of CP (no records  available in Epic)  Recent Labs Lab 03/04/16 0343 03/05/16 0334 03/07/16 0845 03/19/2016 0351 03/09/16 0431  HGB 10.1* 10.2* 8.8* 9.3* 10.2*    DM type II w/ hyperglycemia  CBG stable   Acute Encephalopathy in setting of critical illness Resolved  - pt presently A&O and possesses capacity to make his own decisions   Severe malnutrition in context of acute illness Patient is much more alert and has now been cleared for diet which he is  tolerating well   Dysphagia Cleared for modified diet by SLP  Disposition Planning  Pending - difficult to place due to trach AND ESRD on HD - pt now has capacity to make his own decisions regarding placement post hospital   DVT prophylaxis: SQ heparin  Code Status: FULL CODE Family Communication: no family present at time of exam  Disposition Plan: SDU   Consultants:  PCCM Cardiology  ID Nephrology  ENT  Antimicrobials:  Cipro 7/12>7/14 Vanc 7/2>7/17 Cefepime 7/14>7/21 Fluconazole 7/7>7/17 Flagyl 7/17 > 7/31  Objective: Blood pressure 135/68, pulse 77, temperature 98.7 F (37.1 C), temperature source Oral, resp. rate 10, height 5' 5"  (1.651 m), weight 54.9 kg (121 lb), SpO2 97 %.  Intake/Output Summary (Last 24 hours) at 03/10/16 1207 Last data filed at 03/10/16 1018  Gross per 24 hour  Intake              363 ml  Output                0 ml  Net              363 ml   Filed Weights   03/09/16 0414 03/09/16 1310 03/10/16 0500  Weight: 57.6 kg (127 lb) 56.3 kg (124 lb 1.9 oz) 54.9 kg (121 lb)    Examination:   CBC:  Recent Labs Lab 03/03/16 1306 03/04/16 0343 03/05/16 0334 03/07/16 0845 03/14/2016 0351 03/09/16 0431  WBC 7.0 7.7 6.2 5.6 5.4 5.2  NEUTROABS 5.6 5.1  --   --   --   --   HGB 10.0* 10.1* 10.2* 8.8* 9.3* 10.2*  HCT 32.7* 34.2* 34.9* 29.2* 32.1* 35.2*  MCV 92.9 93.2 94.1 92.1 95.3 95.9  PLT 213 221 217 191 169 510   Basic Metabolic Panel:  Recent Labs Lab 03/03/16 1306 03/04/16 0343 03/05/16 0644 03/07/16 0834 03/10/16 0712  NA 134* 135 132* 135  135 138  K 3.3* 4.0 4.1 3.7  3.6 5.4*  CL 91* 93* 91* 92*  92* 101  CO2 31 32 29 31  31 25   GLUCOSE 147* 127* 72 101*  105* 78  BUN 20 29* 47* 42*  41* 37*  CREATININE 2.62* 3.11* 3.98* 4.94*  4.96* 5.25*  CALCIUM 8.3* 8.6* 8.5* 8.6*  8.7* 8.8*  MG 2.1 2.3  --  2.4  --   PHOS  --   --  4.6 4.9* 4.1     GFR: Estimated Creatinine Clearance: 14.4 mL/min (by C-G formula based on  SCr of 5.25 mg/dL).  Liver Function Tests:  Recent Labs Lab 03/05/16 0644 03/07/16 0834 03/10/16 0712  ALBUMIN 1.9* 2.1* 1.9*    Cardiac Enzymes:  Recent Labs Lab 03/04/16 1615 03/04/16 2131 03/05/16 0334  TROPONINI 0.06* 0.05* 0.07*    HbA1C: Hgb A1c MFr Bld  Date/Time Value Ref Range Status  02/02/2016 06:50 AM 7.7 (H) 4.8 - 5.6 % Final    Comment:    (NOTE)         Pre-diabetes: 5.7 -  6.4         Diabetes: >6.4         Glycemic control for adults with diabetes: <7.0   01/25/2016 04:32 AM 8.2 (H) 4.0 - 6.0 % Final    CBG:  Recent Labs Lab 03/09/16 1542 03/09/16 1906 03/09/16 2332 03/10/16 0337 03/10/16 0719  GLUCAP 121* 174* 161* 138* 97    Recent Results (from the past 240 hour(s))  Culture, blood (routine x 2)     Status: None   Collection Time: 03/03/16  1:11 PM  Result Value Ref Range Status   Specimen Description BLOOD BLOOD RIGHT HAND  Final   Special Requests IN PEDIATRIC BOTTLE 3CC  Final   Culture NO GROWTH 5 DAYS  Final   Report Status 03/10/2016 FINAL  Final  Culture, blood (routine x 2)     Status: Abnormal   Collection Time: 03/03/16  1:16 PM  Result Value Ref Range Status   Specimen Description BLOOD BLOOD RIGHT ARM  Final   Special Requests IN PEDIATRIC BOTTLE 3CC  Final   Culture  Setup Time   Final    GRAM POSITIVE COCCI IN CLUSTERS AEROBIC BOTTLE ONLY CRITICAL RESULT CALLED TO, READ BACK BY AND VERIFIED WITH: M TURNER 03/04/16 @ 59 M VESTAL    Culture (A)  Final    STAPHYLOCOCCUS SPECIES (COAGULASE NEGATIVE) THE SIGNIFICANCE OF ISOLATING THIS ORGANISM FROM A SINGLE SET OF BLOOD CULTURES WHEN MULTIPLE SETS ARE DRAWN IS UNCERTAIN. PLEASE NOTIFY THE MICROBIOLOGY DEPARTMENT WITHIN ONE WEEK IF SPECIATION AND SENSITIVITIES ARE REQUIRED.    Report Status 03/06/2016 FINAL  Final  Blood Culture ID Panel (Reflexed)     Status: Abnormal   Collection Time: 03/03/16  1:16 PM  Result Value Ref Range Status   Enterococcus species NOT  DETECTED NOT DETECTED Final   Vancomycin resistance NOT DETECTED NOT DETECTED Final   Listeria monocytogenes NOT DETECTED NOT DETECTED Final   Staphylococcus species DETECTED (A) NOT DETECTED Final    Comment: CRITICAL RESULT CALLED TO, READ BACK BY AND VERIFIED WITH: M TURNER 03/04/16 @ 25 M VESTAL    Staphylococcus aureus NOT DETECTED NOT DETECTED Final   Methicillin resistance NOT DETECTED NOT DETECTED Final   Streptococcus species NOT DETECTED NOT DETECTED Final   Streptococcus agalactiae NOT DETECTED NOT DETECTED Final   Streptococcus pneumoniae NOT DETECTED NOT DETECTED Final   Streptococcus pyogenes NOT DETECTED NOT DETECTED Final   Acinetobacter baumannii NOT DETECTED NOT DETECTED Final   Enterobacteriaceae species NOT DETECTED NOT DETECTED Final   Enterobacter cloacae complex NOT DETECTED NOT DETECTED Final   Escherichia coli NOT DETECTED NOT DETECTED Final   Klebsiella oxytoca NOT DETECTED NOT DETECTED Final   Klebsiella pneumoniae NOT DETECTED NOT DETECTED Final   Proteus species NOT DETECTED NOT DETECTED Final   Serratia marcescens NOT DETECTED NOT DETECTED Final   Carbapenem resistance NOT DETECTED NOT DETECTED Final   Haemophilus influenzae NOT DETECTED NOT DETECTED Final   Neisseria meningitidis NOT DETECTED NOT DETECTED Final   Pseudomonas aeruginosa NOT DETECTED NOT DETECTED Final   Candida albicans NOT DETECTED NOT DETECTED Final   Candida glabrata NOT DETECTED NOT DETECTED Final   Candida krusei NOT DETECTED NOT DETECTED Final   Candida parapsilosis NOT DETECTED NOT DETECTED Final   Candida tropicalis NOT DETECTED NOT DETECTED Final  Culture, respiratory (NON-Expectorated)     Status: None   Collection Time: 03/03/16  1:19 PM  Result Value Ref Range Status   Specimen Description TRACHEAL ASPIRATE  Final   Special Requests NONE  Final   Gram Stain   Final    ABUNDANT WBC PRESENT, PREDOMINANTLY PMN RARE SQUAMOUS EPITHELIAL CELLS PRESENT FEW GRAM NEGATIVE COCCI  IN PAIRS RARE GRAM NEGATIVE RODS RARE GRAM POSITIVE COCCI IN PAIRS    Culture MULTIPLE ORGANISMS PRESENT, NONE PREDOMINANT  Final   Report Status 03/05/2016 FINAL  Final     Scheduled Meds: . antiseptic oral rinse  7 mL Mouth Rinse QID  . aspirin  81 mg Oral Daily  . atorvastatin  80 mg Oral q1800  . chlorhexidine gluconate (SAGE KIT)  15 mL Mouth Rinse BID  . darbepoetin (ARANESP) injection - DIALYSIS  200 mcg Intravenous Q Thu-HD  . feeding supplement (ENSURE ENLIVE)  237 mL Oral TID BM  . feeding supplement (NEPRO CARB STEADY)  237 mL Oral BID BM  . ferric gluconate (FERRLECIT/NULECIT) IV  125 mg Intravenous Q T,Th,Sa-HD  . gabapentin  300 mg Oral Q12H  . heparin subcutaneous  5,000 Units Subcutaneous Q8H  . insulin aspart  0-9 Units Subcutaneous Q4H  . ipratropium-albuterol  3 mL Nebulization BID  . isosorbide dinitrate  10 mg Oral TID  . metoprolol tartrate  25 mg Oral BID  . multivitamin  1 tablet Oral QHS  . pantoprazole sodium  40 mg Oral Daily  . sodium chloride flush  3 mL Intravenous Q12H     LOS: 23 days    Cherene Altes, MD [facility] Office  [phone] Pager - Text Page per Amion as per below:  On-Call/Text Page:      [email or web address]      password [email or web address]  If 7PM-7AM, please contact night-coverage [email or web address] Password [email or web address] 03/10/2016, 12:07 PM

## 2016-03-11 ENCOUNTER — Encounter (HOSPITAL_COMMUNITY): Admission: AD | Disposition: E | Payer: Self-pay | Attending: Internal Medicine

## 2016-03-11 DIAGNOSIS — R7989 Other specified abnormal findings of blood chemistry: Secondary | ICD-10-CM

## 2016-03-11 DIAGNOSIS — R1310 Dysphagia, unspecified: Secondary | ICD-10-CM

## 2016-03-11 DIAGNOSIS — I420 Dilated cardiomyopathy: Secondary | ICD-10-CM

## 2016-03-11 DIAGNOSIS — D638 Anemia in other chronic diseases classified elsewhere: Secondary | ICD-10-CM

## 2016-03-11 LAB — CBC
HEMATOCRIT: 29.3 % — AB (ref 39.0–52.0)
Hemoglobin: 8.8 g/dL — ABNORMAL LOW (ref 13.0–17.0)
MCH: 28.8 pg (ref 26.0–34.0)
MCHC: 30 g/dL (ref 30.0–36.0)
MCV: 95.8 fL (ref 78.0–100.0)
PLATELETS: 114 10*3/uL — AB (ref 150–400)
RBC: 3.06 MIL/uL — AB (ref 4.22–5.81)
RDW: 19.7 % — AB (ref 11.5–15.5)
WBC: 5.3 10*3/uL (ref 4.0–10.5)

## 2016-03-11 LAB — GLUCOSE, CAPILLARY
GLUCOSE-CAPILLARY: 92 mg/dL (ref 65–99)
GLUCOSE-CAPILLARY: 164 mg/dL — AB (ref 65–99)
Glucose-Capillary: 121 mg/dL — ABNORMAL HIGH (ref 65–99)
Glucose-Capillary: 169 mg/dL — ABNORMAL HIGH (ref 65–99)
Glucose-Capillary: 104 mg/dL — ABNORMAL HIGH (ref 65–99)

## 2016-03-11 LAB — RENAL FUNCTION PANEL
ALBUMIN: 1.9 g/dL — AB (ref 3.5–5.0)
Anion gap: 9 (ref 5–15)
BUN: 19 mg/dL (ref 6–20)
CO2: 28 mmol/L (ref 22–32)
CREATININE: 3.58 mg/dL — AB (ref 0.61–1.24)
Calcium: 8.4 mg/dL — ABNORMAL LOW (ref 8.9–10.3)
Chloride: 97 mmol/L — ABNORMAL LOW (ref 101–111)
GFR calc Af Amer: 23 mL/min — ABNORMAL LOW (ref >60)
GFR, EST NON AFRICAN AMERICAN: 20 mL/min — AB (ref >60)
GLUCOSE: 123 mg/dL — AB (ref 65–99)
PHOSPHORUS: 3.2 mg/dL (ref 2.5–4.6)
POTASSIUM: 4.4 mmol/L (ref 3.5–5.1)
Sodium: 134 mmol/L — ABNORMAL LOW (ref 135–145)

## 2016-03-11 SURGERY — EGD (ESOPHAGOGASTRODUODENOSCOPY)
Anesthesia: Monitor Anesthesia Care

## 2016-03-11 NOTE — Progress Notes (Signed)
SUBJECTIVE: Pt has no complaints this am. No chest pain. No dyspnea.   BP (!) 106/59   Pulse 72   Temp 98.5 F (36.9 C) (Oral)   Resp 12   Ht 5' 5" (1.651 m)   Wt 127 lb 13.9 oz (58 kg)   SpO2 100%   BMI 21.28 kg/m   Intake/Output Summary (Last 24 hours) at 03/07/2016 9604 Last data filed at 03/10/2016 0300  Gross per 24 hour  Intake              363 ml  Output              500 ml  Net             -137 ml    PHYSICAL EXAM General: thin, ill appearing male. Alert and oriented x 3.  Psych:  Good affect, responds appropriately Neck: No JVD. No masses noted.  Lungs: Clear bilaterally with no wheezes or rhonci noted.  Heart: RRR with no murmurs noted. Abdomen: Bowel sounds are present. Soft, non-tender.  Extremities: No lower extremity edema.   LABS: Basic Metabolic Panel:  Recent Labs  03/10/16 0712 03/15/2016 0424  NA 138 134*  K 5.4* 4.4  CL 101 97*  CO2 25 28  GLUCOSE 78 123*  BUN 37* 19  CREATININE 5.25* 3.58*  CALCIUM 8.8* 8.4*  PHOS 4.1 3.2   CBC:  Recent Labs  03/09/16 0431 03/12/2016 0424  WBC 5.2 5.3  HGB 10.2* 8.8*  HCT 35.2* 29.3*  MCV 95.9 95.8  PLT 167 114*   Current Meds: . antiseptic oral rinse  7 mL Mouth Rinse QID  . aspirin  81 mg Oral Daily  . atorvastatin  80 mg Oral q1800  . chlorhexidine gluconate (SAGE KIT)  15 mL Mouth Rinse BID  . darbepoetin (ARANESP) injection - DIALYSIS  200 mcg Intravenous Q Thu-HD  . feeding supplement (ENSURE ENLIVE)  237 mL Oral TID BM  . feeding supplement (NEPRO CARB STEADY)  237 mL Oral BID BM  . ferric gluconate (FERRLECIT/NULECIT) IV  125 mg Intravenous Q T,Th,Sa-HD  . gabapentin  300 mg Oral BID  . heparin subcutaneous  5,000 Units Subcutaneous Q8H  . insulin aspart  0-9 Units Subcutaneous Q4H  . ipratropium-albuterol  3 mL Nebulization BID  . isosorbide dinitrate  10 mg Oral TID  . metoprolol tartrate  25 mg Oral BID  . multivitamin  1 tablet Oral QHS  . pantoprazole  40 mg Oral Q1200      ASSESSMENT AND PLAN: Tanner Campbell is 40 yo male with PMH of HTN, HLD, DM, Multiple sclerosis, COPD and ESRD on HD since 05/02/2014 presented with acute resp distress, failed multiple extubation attempt. Acute respiratory failure felt to be due to a combination of Haemophilus pneumonia and volume overload. Patient missed at least 3 episodes of hemodialysis before presentation, nephrology managing hemodialysis. Found to have mildly elevated trop on 7/7-7/8. Cardiology consulted 7/10 for elevated troponin and EKG changes. An echocardiogram 02/07/16 showed mildly reduced LVEF of 45-50% however there is akinesis of the mid inferior and inferoseptal myocardium, mild aortic insufficiency and a small pericardial effusion. A repeat EKG 7/13 showed worsening deep T-wave inversions in the antero-lateral leads with high voltage as well as inferior T-wave inversions. Advice to avoid ASA in setting of acute GI bleed. Echo 02/15/16 showed slightly worse LV function with focal inferior wall motion abnormality, which could suggest severe ischemia or infarct in this territory. He experienced  acute encephalopathy on 02/13/2016 and cardiac work up was deferred until the patient was more stable. He underwent a Lexiscan myoview on 03/06/2016 which resulted as intermediate risk with a medium defect of severe severity present in the basal inferior and mid inferior location. EF noted at 30-44%.   1. Elevated troponin/Abnormal stress test/chest pain: Pt has had chest pain during the hospitalization. Nuclear stress test with reversible defect in the inferior and inferoseptal region corresponding to wall motion abnormality seen on echo.  He is very debilitated. His anemia is worsening. GI workup with EGD pending, likely tomorrow. Will plan cath when GI workup is complete. Continue ASA, statin and lopressor   2.  Dilated cardiomyopathy: Unknown etiology. Possibly ischemic in origin. EF 45-50% by echo and 30-44% by nuclear stress test.    3.  ESRD on HD per nephrology       8/7/20178:21 AM  

## 2016-03-11 NOTE — Care Management Important Message (Signed)
Important Message  Patient Details  Name: Tanner Campbell MRN: [id number] Date of Birth: Apr 03, 1976   Medicare Important Message Given:  Yes    [name] 03/21/2016, 10:57 AM

## 2016-03-11 NOTE — Progress Notes (Signed)
SLP Note:  SLP informed that pt had Ensure coming out of his trach site. Discussed with RN, who reports that diet in computer was for thin liquids. She had already changed the diet back to nectar thick liquids to reflect SLP recommendations; diet in computer was for regular textures though.  SLP further corrected diet for Dys 2 textures with nectar thick liquids and reinforced need for chin tuck. Reviewed results and recommendations from [facility] on 8/2 with RN. Will f/u again on next date to assess pt for diet tolerance with corrected diet.  Maxcine Ham, M.A. CCC-SLP [phone]

## 2016-03-11 NOTE — Progress Notes (Signed)
Clarified order, clear liquid diet, with Jennye Moccasin, PA that patient is on nectar thick liquid.  She stated that adding a thickener on patient's diet is okay.  Informed patient about this conversation and he agrees with this plan.

## 2016-03-11 NOTE — Procedures (Signed)
Tolerating hemodialysis treatment without hemodynamic issues today. Tanner Campbell C

## 2016-03-11 NOTE — Plan of Care (Signed)
Problem: Activity: Goal: Risk for activity intolerance will decrease Outcome: Progressing Encouraged movement in legs to help regain strength, encouraged to help move self up in bed

## 2016-03-11 NOTE — Progress Notes (Signed)
PULMONARY / CRITICAL CARE MEDICINE   Name: Tanner Campbell MRN: [id number] DOB: November 05, 1975    ADMISSION DATE:  02/21/2016  REFERRING MD:  Sharyon Medicus -->McClung   CHIEF COMPLAINT:  Prolonged critical illness, failure to wean, recurrent sepsis. PCCM following for trach care.   HPI: 40 year old male w/ ESRD whom we initially admitted to [facility] from [facility] where he was admitted for ongoing care for PNA and AECOPD. His course was complicated by: HCAP, AECOPD, bacteremia (veillonella species), NSTEMI, pericardial friction rub, GIB, and acute encephalopathy and now severe physical deconditioning and protein calorie malnutrition. Was transferred to [facility] per family request & on-going care. Eventually underwent trach on 7/18 was liberated from the vent and transferred to the IM service. He has been making slow progress. His trach now changed to 6 cuffless and ENT has singed off. PCCM was asked to assist w/ vent trach management and timing of decannulation.    SUBJECTIVE:  Pt denies acute complaints - fevers / chills / increased sputum production.  Tolerating PMV with good voice projection.  Plan for LHC once GI evaluation complete per Cards.  Concern for Ensure coming out of trach per RN - not seen with thick liquids  VITAL SIGNS: BP (!) 111/49   Pulse 76   Temp 98.5 F (36.9 C) (Oral)   Resp 18   Ht  (1.651 m)   Wt 127 lb 13.9 oz (58 kg)   SpO2 98%   BMI 21.28 kg/m   VENTILATOR SETTINGS: FiO2 (%):  [28 %] 28 %  INTAKE / OUTPUT: I/O last 3 completed shifts: In: 363 [P.O.:360; I.V.:3] Out: 500 [Other:500]  PHYSICAL EXAMINATION: General: Awake, alert, in NAD Neuro: Follows commands, oriented, remains weak, speech and cough are stronger.  HEENT: Trach in place, 6 cuffless. PMV voice quality improved Cardiac: regular, no murmur Chest: normal work of breathing, clear and w/out accessory use.  Abd: soft, non tender Ext: no edema, decreased muscle bulk Skin: multiple areas of healing  excoriations on the lower extremities  LABS:  BMET  Recent Labs Lab 03/07/16 0834 03/10/16 0712 03/17/2016 0424  NA 135  135 138 134*  K 3.7  3.6 5.4* 4.4  CL 92*  92* 101 97*  CO2 BUN 42*  41* 37* 19  CREATININE 4.94*  4.96* 5.25* 3.58*  GLUCOSE 101*  105* 78 123*   CBC  Recent Labs Lab 2016/04/04 0351 03/09/16 0431 03/30/2016 0424  WBC 5.4 5.2 5.3  HGB 9.3* 10.2* 8.8*  HCT 32.1* 35.2* 29.3*  PLT 169 167 114*    STUDIES:  7/13 Echo >> EF 40 to 45%, mod LVH, small pericardial effusion 7/17 CXR >> stable bilateral pleural effusions, stable patchy opacities in the lung bases- pneumonia vs atelectasis 7/17 ANA >> neg  CULTURES: BCX2 7/10: Veillonella species (Gram negative coccobacilli) resp culture 7/10: Moderate SA and E.coli (both pan sensitive)  ANTIBIOTICS: cipro 7/12 >> 7/14 vanc 7/2 >> 7/17 Cefepime 7/14 >> 7/21 Fluconazole 7/7 >> 7/17 Flagyl 7/17 >>   SIGNIFICANT EVENTS: 7/14 Transfer from [facility] to [facility] 7/18 Trach placed 7/22 off vent to SDU. 7/25 PCCM asked to assist w/ trach care  8/04 MBS, SLP recommending dysphagia 2 diet with chin tucks.  Nuclear stress test >> no ischemia   LINES/TUBES: OETT 7/10>>>7/17; 7/17 >>> Right IJ PICC (? 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   Interval from 8/7: improved strength. Using PMV, reports voice quality improved, able to cough and clear secretions. New concern for MS which has been a chronic question but just now identified here at [facility]. Not sure to what extent this has played. GI planning on EGD 8/8 with LHC per Cards after that.    Plan Continue to work w/ SLP for improved voice quality Continue aggressive PT  Continue routine trach care  Repeat SLP eval with thin liquids, have asked RN to call SLP If he continues to progress hope to attempt capping trials soon (ideally after he has his EGD). When we do this would have him do 48 hrs w/ cont pulse ox before successfully and w/out incident before we would consider decannulation.  ? Neuro work up for possible MS At some point he will need cardiac cath; but cardiology wants wants EGD first given h/o GIB at [facility]  [facility] vs other SNF that accepts trach-HD (pt's sister does not want him to return to [facility])   PCCM will follow intermittently for trach / vent needs.  Canary Brim, NP-C [facility] Pgr: [phone] or if no answer [phone] 03/14/2016, 9:15 AM

## 2016-03-11 NOTE — Progress Notes (Signed)
[place] TEAM 1 - Stepdown/ICU TEAM  Tanner Campbell  LZJ:[id number] DOB: 10-03-1975 DOA: 02/27/2016 PCP: No primary care provider on file.    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  He decompensated 6/23 and required intubation. The patient was found to have H. Influenza positive sputum and treated with Rocephin. CXR demonstrated a large right pleural effusion and he underwent a thoracentesis (1L serous fluid removed - exudative by protein). He had difficulty with weaning and was transferred to [facility] on 6/29 orally intubated for ventilator weaning. The patient developed fever and antibiotics were expanded to vancomycin, cefepime and diflucan. He had progressed to weaning on PSV x 4 hours as of 7/3. He self extubated on 7/5 but required BiPAP off and on thereafter. After HD 7/10his mental status worsened and he became minimally responsive. CXR was consistent with RLL opacification and he was re-intubated. He was re-cultured and also found to have increased Troponin for which Cardiology was consulted. He was placed on LMWH, but had a GIB while on this w/ his hgb dropping as low as 8.4.  On 7/14 he remained ventilator dependent. His sputum cultures grew MSSA and E coli and he was noted to have + blood cultures. Because of these acutely worsening issues it was felt best to transfer him to the ICU at [facility].   Significant Events: 6/21 admitted at [facility] 6/29 transfer to [facility] 7/14 transfer from [facility] to [facility] 7/18 trach placed  Subjective: The patient is seen in his room.  He is in bed.  He denies chest pain shortness breath fevers chills nausea or vomiting.  His voice is clear and intelligible.  Assessment & Plan:  Acute hypoxic respiratory failure - s/p trach 7/18 - HCAP Ventilator management and trach care per PCCM - has  completed a course of cefepime - currently tolerating trach collar without difficulty - PCCM considering trials w/ capping of trach next week   Coag negative Staph species in blood cx Blood cx obtained 7/30 1 of 2 + - pt does not appear toxic - afeb w/ normal WBC - most consistent with contaminant  Bacteremia with Veillonella species (Gram negative rod) completed Flagyl x 2 weeks for bacteremia as per ID recs  Bilateral pleural effusions s/p thoracentesis Stable on follow-up chest x-ray 7/21 - no respiratory distress w/ stable oxygenation  NSTEMI vs demand ischemia nuclear medicine stress test noted basal inferior and mid inferior perfusion defecst - Cards planning for cardiac cath after GI eval completed   Small pericardial effusion with friction rub - resolved  ANA negative - TEE without evidence of endocarditis/vegetations - hemodynamically stable - rub has resolved  Mild to moderate aortic regurgitation Hemodynamically stable   ESRD on HD Tue/Th/Sat Nephrology following   Reported recent GIB on LMWH No evidence of acute blood loss a this time - this occurred while the pt was at [facility] when lovenox was begun during an episode of CP - awaiting EGD (was bumped from schedule today for more acute cases)  Anemia of chronic kidney disease + acute blood loss  No evidence of blood loss, but Hgb drifted down to 7.1 - s/p 1U PRBC 7/24 - Hgb fluctuating mildly - no clinical evidence of GIB at this time - reports of acute blood loss date back to [facility] when  pt was placed on lovenox during episode of CP (no records available in Epic)  Recent Labs Lab 03/05/16 0334 03/07/16 0845 03/24/2016 0351 03/09/16 0431 04/03/2016 0424  HGB 10.2* 8.8* 9.3* 10.2* 8.8*    DM type II w/ hyperglycemia  CBG stable   Acute Encephalopathy in setting of critical illness Resolved  - pt presently A&O and possesses capacity to make his own decisions   Severe malnutrition in context of acute illness Patient is  much more alert and has now been cleared for diet which he is tolerating well   Dysphagia Cleared for modified diet by SLP  Disposition Planning  Pending - difficult to place due to trach AND ESRD on HD - pt now has capacity to make his own decisions regarding placement post hospital   DVT prophylaxis: SQ heparin  Code Status: FULL CODE Family Communication: no family present at time of exam  Disposition Plan: SDU   Consultants:  PCCM Cardiology  ID Nephrology  ENT  Antimicrobials:  Cipro 7/12>7/14 Vanc 7/2>7/17 Cefepime 7/14>7/21 Fluconazole 7/7>7/17 Flagyl 7/17 > 7/31  Objective: Blood pressure (!) 115/53, pulse 83, temperature 98.4 F (36.9 C), temperature source Oral, resp. rate 16, height 5' 5"  (1.651 m), weight 58 kg (127 lb 13.9 oz), SpO2 96 %.  Intake/Output Summary (Last 24 hours) at 03/14/2016 1524 Last data filed at 03/31/2016 1400  Gross per 24 hour  Intake               25 ml  Output              500 ml  Net             -475 ml   Filed Weights   03/26/2016 0022 03/16/2016 0401 03/31/2016 0720  Weight: 58 kg (127 lb 13.9 oz) 58 kg (127 lb 13.9 oz) 58 kg (127 lb 13.9 oz)    Examination: Gen:  Alert oriented and conversant Lungs:  Clear B - no wheeze  CV: RRR Ext:  No signif edema   CBC:  Recent Labs Lab 03/05/16 0334 03/07/16 0845 03/07/2016 0351 03/09/16 0431 03/09/2016 0424  WBC 6.2 5.6 5.4 5.2 5.3  HGB 10.2* 8.8* 9.3* 10.2* 8.8*  HCT 34.9* 29.2* 32.1* 35.2* 29.3*  MCV 94.1 92.1 95.3 95.9 95.8  PLT 217 191 169 167 696*   Basic Metabolic Panel:  Recent Labs Lab 03/05/16 0644 03/07/16 0834 03/10/16 0712 03/06/2016 0424  NA 132* 135  135 138 134*  K 4.1 3.7  3.6 5.4* 4.4  CL 91* 92*  92* 101 97*  CO2 29 31  31 25 28   GLUCOSE 72 101*  105* 78 123*  BUN 47* 42*  41* 37* 19  CREATININE 3.98* 4.94*  4.96* 5.25* 3.58*  CALCIUM 8.5* 8.6*  8.7* 8.8* 8.4*  MG  --  2.4  --   --   PHOS 4.6 4.9* 4.1 3.2     GFR: Estimated Creatinine  Clearance: 22.3 mL/min (by C-G formula based on SCr of 3.58 mg/dL).  Liver Function Tests:  Recent Labs Lab 03/05/16 0644 03/07/16 0834 03/10/16 0712 03/15/2016 0424  ALBUMIN 1.9* 2.1* 1.9* 1.9*    Cardiac Enzymes:  Recent Labs Lab 03/04/16 1615 03/04/16 2131 03/05/16 0334  TROPONINI 0.06* 0.05* 0.07*    HbA1C: Hgb A1c MFr Bld  Date/Time Value Ref Range Status  02/02/2016 06:50 AM 7.7 (H) 4.8 - 5.6 % Final    Comment:    (NOTE)  Pre-diabetes: 5.7 - 6.4         Diabetes: >6.4         Glycemic control for adults with diabetes: <7.0   01/25/2016 04:32 AM 8.2 (H) 4.0 - 6.0 % Final    CBG:  Recent Labs Lab 03/10/16 1823 03/10/16 1914 03/10/16 2324 03/30/2016 0302 03/15/2016 1233  GLUCAP 115* 144* 139* 121* 92    Recent Results (from the past 240 hour(s))  Culture, blood (routine x 2)     Status: None   Collection Time: 03/03/16  1:11 PM  Result Value Ref Range Status   Specimen Description BLOOD BLOOD RIGHT HAND  Final   Special Requests IN PEDIATRIC BOTTLE 3CC  Final   Culture NO GROWTH 5 DAYS  Final   Report Status 03/10/2016 FINAL  Final  Culture, blood (routine x 2)     Status: Abnormal   Collection Time: 03/03/16  1:16 PM  Result Value Ref Range Status   Specimen Description BLOOD BLOOD RIGHT ARM  Final   Special Requests IN PEDIATRIC BOTTLE 3CC  Final   Culture  Setup Time   Final    GRAM POSITIVE COCCI IN CLUSTERS AEROBIC BOTTLE ONLY CRITICAL RESULT CALLED TO, READ BACK BY AND VERIFIED WITH: M TURNER 03/04/16 @ 18 M VESTAL    Culture (A)  Final    STAPHYLOCOCCUS SPECIES (COAGULASE NEGATIVE) THE SIGNIFICANCE OF ISOLATING THIS ORGANISM FROM A SINGLE SET OF BLOOD CULTURES WHEN MULTIPLE SETS ARE DRAWN IS UNCERTAIN. PLEASE NOTIFY THE MICROBIOLOGY DEPARTMENT WITHIN ONE WEEK IF SPECIATION AND SENSITIVITIES ARE REQUIRED.    Report Status 03/06/2016 FINAL  Final  Blood Culture ID Panel (Reflexed)     Status: Abnormal   Collection Time: 03/03/16  1:16  PM  Result Value Ref Range Status   Enterococcus species NOT DETECTED NOT DETECTED Final   Vancomycin resistance NOT DETECTED NOT DETECTED Final   Listeria monocytogenes NOT DETECTED NOT DETECTED Final   Staphylococcus species DETECTED (A) NOT DETECTED Final    Comment: CRITICAL RESULT CALLED TO, READ BACK BY AND VERIFIED WITH: M TURNER 03/04/16 @ 56 M VESTAL    Staphylococcus aureus NOT DETECTED NOT DETECTED Final   Methicillin resistance NOT DETECTED NOT DETECTED Final   Streptococcus species NOT DETECTED NOT DETECTED Final   Streptococcus agalactiae NOT DETECTED NOT DETECTED Final   Streptococcus pneumoniae NOT DETECTED NOT DETECTED Final   Streptococcus pyogenes NOT DETECTED NOT DETECTED Final   Acinetobacter baumannii NOT DETECTED NOT DETECTED Final   Enterobacteriaceae species NOT DETECTED NOT DETECTED Final   Enterobacter cloacae complex NOT DETECTED NOT DETECTED Final   Escherichia coli NOT DETECTED NOT DETECTED Final   Klebsiella oxytoca NOT DETECTED NOT DETECTED Final   Klebsiella pneumoniae NOT DETECTED NOT DETECTED Final   Proteus species NOT DETECTED NOT DETECTED Final   Serratia marcescens NOT DETECTED NOT DETECTED Final   Carbapenem resistance NOT DETECTED NOT DETECTED Final   Haemophilus influenzae NOT DETECTED NOT DETECTED Final   Neisseria meningitidis NOT DETECTED NOT DETECTED Final   Pseudomonas aeruginosa NOT DETECTED NOT DETECTED Final   Candida albicans NOT DETECTED NOT DETECTED Final   Candida glabrata NOT DETECTED NOT DETECTED Final   Candida krusei NOT DETECTED NOT DETECTED Final   Candida parapsilosis NOT DETECTED NOT DETECTED Final   Candida tropicalis NOT DETECTED NOT DETECTED Final  Culture, respiratory (NON-Expectorated)     Status: None   Collection Time: 03/03/16  1:19 PM  Result Value Ref Range Status   Specimen Description  TRACHEAL ASPIRATE  Final   Special Requests NONE  Final   Gram Stain   Final    ABUNDANT WBC PRESENT, PREDOMINANTLY  PMN RARE SQUAMOUS EPITHELIAL CELLS PRESENT FEW GRAM NEGATIVE COCCI IN PAIRS RARE GRAM NEGATIVE RODS RARE GRAM POSITIVE COCCI IN PAIRS    Culture MULTIPLE ORGANISMS PRESENT, NONE PREDOMINANT  Final   Report Status 03/05/2016 FINAL  Final     Scheduled Meds: . antiseptic oral rinse  7 mL Mouth Rinse QID  . aspirin  81 mg Oral Daily  . atorvastatin  80 mg Oral q1800  . chlorhexidine gluconate (SAGE KIT)  15 mL Mouth Rinse BID  . darbepoetin (ARANESP) injection - DIALYSIS  200 mcg Intravenous Q Thu-HD  . feeding supplement (ENSURE ENLIVE)  237 mL Oral TID BM  . feeding supplement (NEPRO CARB STEADY)  237 mL Oral BID BM  . ferric gluconate (FERRLECIT/NULECIT) IV  125 mg Intravenous Q T,Th,Sa-HD  . gabapentin  300 mg Oral BID  . heparin subcutaneous  5,000 Units Subcutaneous Q8H  . insulin aspart  0-9 Units Subcutaneous Q4H  . ipratropium-albuterol  3 mL Nebulization BID  . isosorbide dinitrate  10 mg Oral TID  . metoprolol tartrate  25 mg Oral BID  . multivitamin  1 tablet Oral QHS  . pantoprazole  40 mg Oral Q1200     LOS: 24 days    Cherene Altes, MD [facility] Office  [phone] Pager - Text Page per Amion as per below:  On-Call/Text Page:      [email or web address]      password [email or web address]  If 7PM-7AM, please contact night-coverage [email or web address] Password [email or web address] 03/20/2016, 3:24 PM

## 2016-03-11 NOTE — Progress Notes (Signed)
Pt is off unit for hemodialysis

## 2016-03-11 NOTE — Progress Notes (Signed)
PT Cancellation Note  Patient Details Name: Tanner Campbell MRN: [id number] DOB: 1976/06/11   Cancelled Treatment:    Reason Eval/Treat Not Completed: Patient at procedure or test/unavailable (pt just left for HD).  Will continue to follow.  Encarnacion Chu PT, DPT  Pager: [phone] Phone: [phone] 04/02/2016, 10:44 AM

## 2016-03-11 NOTE — Progress Notes (Signed)
Daily Rounding Note  03/08/2016, 8:25 AM  LOS: 24 days   SUBJECTIVE:   Chief complaint: none    Pt denies n/v.  Stools are not black or bloody.  No abdominal pain. Continues to eat well though somewhat erraticaly, mostly 100%, sometimes only 50 % of his meals.   OBJECTIVE:         Vital signs in last 24 hours:    Temp:  [97.8 F (36.6 C)-98.8 F (37.1 C)] 98.5 F (36.9 C) (08/07 0720) Pulse Rate:  [71-89] 72 (08/07 0800) Resp:  [10-26] 12 (08/07 0800) BP: (96-167)/(32-83) 106/59 (08/07 0800) SpO2:  [97 %-100 %] 100 % (08/07 0720) FiO2 (%):  [28 %] 28 % (08/07 0300) Weight:  [55.5 kg (122 lb 5.7 oz)-58 kg (127 lb 13.9 oz)] 58 kg (127 lb 13.9 oz) (08/07 0720) Last BM Date: 03/19/2016 Filed Weights   03/17/2016 0022 03/28/2016 0401 04/04/2016 0720  Weight: 58 kg (127 lb 13.9 oz) 58 kg (127 lb 13.9 oz) 58 kg (127 lb 13.9 oz)   General: pale, alert, looks better than 3 days ago.   Heart: RRR Chest: clear bil.  No labored breathing.  Tach in place so voice is hoarse and soft Abdomen: soft, NT, ND.  Active BS  Extremities: some minor LE edema, not pitting.   Neuro/Psych:  Pleasant, calm, cooperative.   Intake/Output from previous day: 08/06 0701 - 08/07 0700 In: 363 [P.O.:360; I.V.:3] Out: 500    Lab Results:  Recent Labs  03/09/16 0431 03/08/2016 0424  WBC 5.2 5.3  HGB 10.2* 8.8*  HCT 35.2* 29.3*  PLT 167 114*   BMET  Recent Labs  03/10/16 0712 03/08/2016 0424  NA 138 134*  K 5.4* 4.4  CL 101 97*  CO2 25 28  GLUCOSE 78 123*  BUN 37* 19  CREATININE 5.25* 3.58*  CALCIUM 8.8* 8.4*      ASSESMENT:   *  Normocytic anemia.  On EPO, parenteral iron.  This is multifactorial and acute on chronic.  Total of 2 PRBCs transfused 7/25, 7/27.  Hgb drop of 1.5 grams since yesterday.     *  "GI bleed" while on Lovenox.  The diagnosis was made based on drop in Hgb while on Lovenox a few weeks ago.  No documented FOBT +  stools,  FOBT negative on 7/27.  On sub q Heparin currently Unremarkable EGD and colonoscopy ~ 4 yrs ago in [place].  Chronic nausea.  Bruises easily as evidenced by exam.     *  Thrombocytopenia, recurrent.  presetn 2 weeks ago and then resolved.     *  Non STEMI, reversible defect and reduced LVEF on nuc stress study.  Cardiology wants GI to investigate the "GI bleed " and anemia before undertaking cardiac cath.    *  VDRF.  S/p trach 02/24/2016.    *  ESRD.  Non-compliance with HD led up to 01/2016 [facility] admission.  MWF HD schedule.   *  IDDM  *  Pharyngeal dysphagia. On dysphagia 3 diet, thick liquids. .    *  Protein cal malnutrition.  On formula supplements in addition to D 3 diet.  Marland Kitchen     PLAN   See below   Jennye Moccasin  03/30/2016, 8:25 AM Pager: [phone]    [facility] Attending   I have taken an interval history, reviewed the chart and examined the patient. I agree with the Advanced Practitioner's note, impression  and recommendations.   I have reviewed his chart back into June and other than a report of some blood in NG tube [facility] weeks ago there is no good evidence to support the perpetuated diagnosis of "prior GI bleed"  He has many reasons to be anemic and have fluctuating Hgb and has not exhibited signs of GI bleeding - even if he had a heme + stool I would not be inclined to scope him given what I know.  I understand there are concerns about possible GI bleeding if he requires sig anti-PLT or anti-coag Tx but I do not think we should avoid a catheterization based upon what we know and would not wait on an EGD for that.  I have discussed with Dr. Rose Fillers - plan to monitor his Hgb before cathing if he does. No EGD or colonoscopy based upon what I know.  thanks  Iva Boop, MD, [facility] [phone] (pager) [phone] after 5 PM, weekends and holidays  03/05/2016 7:44 PM

## 2016-03-12 ENCOUNTER — Encounter (HOSPITAL_COMMUNITY): Admission: AD | Disposition: E | Payer: Self-pay | Attending: Internal Medicine

## 2016-03-12 ENCOUNTER — Inpatient Hospital Stay (HOSPITAL_COMMUNITY): Payer: Medicare Other

## 2016-03-12 LAB — CBC
HCT: 30.9 % — ABNORMAL LOW (ref 39.0–52.0)
Hemoglobin: 8.9 g/dL — ABNORMAL LOW (ref 13.0–17.0)
MCH: 28 pg (ref 26.0–34.0)
MCHC: 28.8 g/dL — ABNORMAL LOW (ref 30.0–36.0)
MCV: 97.2 fL (ref 78.0–100.0)
PLATELETS: 127 10*3/uL — AB (ref 150–400)
RBC: 3.18 MIL/uL — ABNORMAL LOW (ref 4.22–5.81)
RDW: 18.8 % — AB (ref 11.5–15.5)
WBC: 6.7 10*3/uL (ref 4.0–10.5)

## 2016-03-12 LAB — GLUCOSE, CAPILLARY
GLUCOSE-CAPILLARY: 80 mg/dL (ref 65–99)
GLUCOSE-CAPILLARY: 117 mg/dL — AB (ref 65–99)
GLUCOSE-CAPILLARY: 166 mg/dL — AB (ref 65–99)
GLUCOSE-CAPILLARY: 151 mg/dL — AB (ref 65–99)
Glucose-Capillary: 86 mg/dL (ref 65–99)

## 2016-03-12 SURGERY — EGD (ESOPHAGOGASTRODUODENOSCOPY)
Anesthesia: Moderate Sedation

## 2016-03-12 MED ORDER — NEPRO/CARBSTEADY PO LIQD
237.0000 mL | Freq: Two times a day (BID) | ORAL | Status: DC
  Administered 2016-03-12 – 2016-03-21 (×11): 237 mL via ORAL

## 2016-03-12 MED ORDER — PROMETHAZINE HCL 25 MG/ML IJ SOLN
12.5000 mg | Freq: Once | INTRAMUSCULAR | Status: AC
  Administered 2016-03-12: 12.5 mg via INTRAVENOUS
  Filled 2016-03-12: qty 1

## 2016-03-12 MED ORDER — ONDANSETRON HCL 4 MG/2ML IJ SOLN
4.0000 mg | Freq: Four times a day (QID) | INTRAMUSCULAR | Status: DC | PRN
  Administered 2016-03-12 – 2016-03-21 (×10): 4 mg via INTRAVENOUS
  Filled 2016-03-12 (×11): qty 2

## 2016-03-12 MED ORDER — ENSURE ENLIVE PO LIQD
237.0000 mL | Freq: Two times a day (BID) | ORAL | Status: DC
  Administered 2016-03-12 – 2016-03-22 (×8): 237 mL via ORAL

## 2016-03-12 NOTE — Progress Notes (Signed)
SLP Cancellation Note  Patient Details Name: Tanner Campbell MRN: [id number] DOB: Jan 11, 1976   Cancelled treatment:       Reason Eval/Treat Not Completed: Medical issues which prohibited therapy. Pt reports feeling nauseous, declines PO intake as a result. RN notified. Will continue to follow.    Maxcine Ham 03/27/2016, 11:06 AM  Maxcine Ham, M.A. CCC-SLP [phone]

## 2016-03-12 NOTE — Progress Notes (Signed)
Assessment/Plan:  1. ESRD  TTS [facility] (and PTA in [facility]). Had HD yesterday off usual schedual 2. Chest pain. NSTEMI. 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 Reduced EF 30-44%. Cardiology to do left heart cath  3. GI - No invasive procedure at this time 4. Mild to moderate AI 5. Acute on chronic resp failure - per CCM. On trach collar.  6. Bilateral pleural effusions - s/p thoracentesis T 6/28 1.1 L. BP has limited getting dry enough to help w/pleural effusions (still seen on radiograph 7/30) 7. Bacteremias (veillonella, GCP clusters 7/30) - s/p cefipime, and finished 14 days flagyl.   8. Anemia - CKD + ? BL 9. Severe malnutrition - per primary service. Eating diet now plus Nepro. 10. Disposition - CM called [facility] on [address] [place]) who indicated they will accept pt with trach as long as it is capped. They will also use hoyer lift prn and he will have to be able to sit in the chair for his HD treatments.  Subjective: Interval History: HD yesterday and 500cc removed  Objective: Vital signs in last 24 hours: Temp:  [98.2 F (36.8 C)-99.6 F (37.6 C)] 98.8 F (37.1 C) (08/08 0802) Pulse Rate:  [69-91] 82 (08/08 0802) Resp:  [10-22] 22 (08/08 0802) BP: (107-158)/(49-78) 158/70 (08/08 0802) SpO2:  [96 %-100 %] 100 % (08/08 0802) FiO2 (%):  [28 %] 28 % (08/08 0753) Weight:  [47 kg (103 lb 9.9 oz)-57.5 kg (126 lb 12.2 oz)] 51 kg (112 lb 7 oz) (08/08 0500) Weight change: 2 kg (4 lb 6.6 oz)  Intake/Output from previous day: 08/07 0701 - 08/08 0700 In: 50 [P.O.:50] Out: 500  Intake/Output this shift: No intake/output data recorded.  General appearance: alert and cooperative Throat: trach Resp: clear to auscultation bilaterally GI: soft Extremities: musckle atrophy  Lab Results:  Recent Labs  03/10/2016 0424 04/03/2016 0349  WBC 5.3 6.7  HGB 8.8* 8.9*  HCT 29.3* 30.9*   PLT 114* 127*   BMET:  Recent Labs  03/10/16 0712 03/16/2016 0424  NA 138 134*  K 5.4* 4.4  CL 101 97*  CO2 25 28  GLUCOSE 78 123*  BUN 37* 19  CREATININE 5.25* 3.58*  CALCIUM 8.8* 8.4*   No results for input(s): PTH in the last 72 hours. Iron Studies: No results for input(s): IRON, TIBC, TRANSFERRIN, FERRITIN in the last 72 hours. Studies/Results: No results found.  Scheduled: . antiseptic oral rinse  7 mL Mouth Rinse QID  . aspirin  81 mg Oral Daily  . atorvastatin  80 mg Oral q1800  . chlorhexidine gluconate (SAGE KIT)  15 mL Mouth Rinse BID  . darbepoetin (ARANESP) injection - DIALYSIS  200 mcg Intravenous Q Thu-HD  . feeding supplement (ENSURE ENLIVE)  237 mL Oral TID BM  . feeding supplement (NEPRO CARB STEADY)  237 mL Oral BID BM  . ferric gluconate (FERRLECIT/NULECIT) IV  125 mg Intravenous Q T,Th,Sa-HD  . gabapentin  300 mg Oral BID  . insulin aspart  0-9 Units Subcutaneous Q4H  . ipratropium-albuterol  3 mL Nebulization BID  . isosorbide dinitrate  10 mg Oral TID  . metoprolol tartrate  25 mg Oral BID  . multivitamin  1 tablet Oral QHS  . pantoprazole  40 mg Oral Q1200        LOS: 25 days   Tanner Campbell C 03/06/2016,8:28 AM

## 2016-03-12 NOTE — Progress Notes (Signed)
   SUBJECTIVE: No chest pain or SOB.   Tele: sinus  BP (!) 158/70 (BP Location: Right Arm)   Pulse 82   Temp 98.8 F (37.1 C) (Oral)   Resp (!) 22   Ht 5' 5" (1.651 m)   Wt 112 lb 7 oz (51 kg)   SpO2 100%   BMI 18.71 kg/m   Intake/Output Summary (Last 24 hours) at 03/29/2016 0924 Last data filed at 03/07/2016 0316  Gross per 24 hour  Intake               50 ml  Output              500 ml  Net             -450 ml    PHYSICAL EXAM General: Thin, cachectic male. NAD. Alert and oriented x 3.  Psych:  Good affect, responds appropriately Neck: No JVD. No masses noted.  Lungs: Clear bilaterally with no wheezes or rhonci noted.  Heart: RRR with systolic murmur noted. Abdomen: Bowel sounds are present. Soft, non-tender.  Extremities: No lower extremity edema.   LABS: Basic Metabolic Panel:  Recent Labs  03/10/16 0712 03/22/2016 0424  NA 138 134*  K 5.4* 4.4  CL 101 97*  CO2 25 28  GLUCOSE 78 123*  BUN 37* 19  CREATININE 5.25* 3.58*  CALCIUM 8.8* 8.4*  PHOS 4.1 3.2   CBC:  Recent Labs  03/30/2016 0424 03/11/2016 0349  WBC 5.3 6.7  HGB 8.8* 8.9*  HCT 29.3* 30.9*  MCV 95.8 97.2  PLT 114* 127*   Current Meds: . antiseptic oral rinse  7 mL Mouth Rinse QID  . aspirin  81 mg Oral Daily  . atorvastatin  80 mg Oral q1800  . chlorhexidine gluconate (SAGE KIT)  15 mL Mouth Rinse BID  . darbepoetin (ARANESP) injection - DIALYSIS  200 mcg Intravenous Q Thu-HD  . feeding supplement (ENSURE ENLIVE)  237 mL Oral TID BM  . feeding supplement (NEPRO CARB STEADY)  237 mL Oral BID BM  . ferric gluconate (FERRLECIT/NULECIT) IV  125 mg Intravenous Q T,Th,Sa-HD  . gabapentin  300 mg Oral BID  . insulin aspart  0-9 Units Subcutaneous Q4H  . ipratropium-albuterol  3 mL Nebulization BID  . isosorbide dinitrate  10 mg Oral TID  . metoprolol tartrate  25 mg Oral BID  . multivitamin  1 tablet Oral QHS  . pantoprazole  40 mg Oral Q1200     ASSESSMENT AND PLAN: 39 yo male with PMH  of HTN, HLD, DM, Multiple sclerosis, COPD and ESRD on HD since 05/02/2014 presented with acute resp distress, failed multiple extubation attempt. Acute respiratory failure felt to be due to a combination of Haemophilus pneumonia and volume overload. Patient missed at least 3 episodes of hemodialysis before presentation, nephrology managing hemodialysis. Found to have mildly elevated trop on 7/7-7/8. Cardiology consulted 7/10 for elevated troponin and EKG changes. An echocardiogram 02/07/16 showedmildly reduced LVEF of 45-50% however there is akinesis of the mid inferior and inferoseptal myocardium, mild aortic insufficiency and a small pericardial effusion. A repeat EKG 7/13 showed worsening deep T-wave inversions in the antero-lateral leads with high voltage as well as inferior T-wave inversions. Echo 02/15/16 showed slightly worse LV function with focal inferior wall motion abnormality, which could suggest severe ischemia or infarct in this territory. He experienced acute encephalopathy on 03/01/2016 and cardiac work up was deferred until the patient was more stable. He underwent a Lexiscan   myoview on 03/06/2016 which resulted as intermediate risk with a medium defect of severe severity present in the basal inferior and mid inferior location. EF noted at 30-44%. He has had anemia and has been seen by the GI team. EGD is not recommended.   1. Elevated troponin/Abnormal stress test/chest pain:Pt has had chest pain during the hospitalization. Nuclear stress test with reversible defect in the inferior and inferoseptal region corresponding to wall motion abnormality seen on echo. He is very debilitated. His anemia appears to be stable. GI team is not planning an EDG at this point. I will place him on the cath schedule for tomorrow tentatively for diagnostic cath only. Will recheck H/H in am and if stable, will proceed. If PCI is needed, may need to delay to document stability of H/H over next few days. Continue ASA,  statin and lopressor   2. Dilated cardiomyopathy: Unknown etiology. Possibly ischemic in origin. EF 45-50% by echo and 30-44% by nuclear stress test.   3. ESRD on HD per nephrology    Lauree Chandler  8/8/20179:24 AM

## 2016-03-12 NOTE — Progress Notes (Signed)
OT Cancellation Note  Patient Details Name: Janai Week MRN: [id number] DOB: 11-13-1975   Cancelled Treatment:    Reason Eval/Treat Not Completed: Pt working with PT   Jeani Hawking, OTR/L [phone]   [name] Mar 25, 2016, 2:01 PM

## 2016-03-12 NOTE — Progress Notes (Signed)
Nutrition Follow-up  DOCUMENTATION CODES:   Severe malnutrition in context of acute illness/injury  INTERVENTION:  Continue Nepro Shake po BID, each supplement provides 425 kcal and 19 grams protein Continue Ensure Enlive po BID, each supplement provides 350 kcal and 20 grams of protein   NUTRITION DIAGNOSIS:   Increased nutrient needs related to chronic illness, wound healing as evidenced by estimated needs.  Ongoing  GOAL:   Patient will meet greater than or equal to 90% of their needs  Unmet  MONITOR:   PO intake, Supplement acceptance, Labs, Weight trends, Skin, I & O's  REASON FOR ASSESSMENT:   Ventilator  (Assessment and reccomendations for Tube feeding)  ASSESSMENT:   40 year old esrd admitted initially to [facility] from [facility] d/t PNA and failure to wean. Self extubated 7/5. Clinically deteriorated over the following days. Required re-intubation 7/10 w/ findings of new HCAP and bacteremia. Transferred to [facility] on 7/14 with concerns for sepsis, NSTEMI, GIB, and endocarditis.   Pt eating breakfast at time of visit. States that his appetite has been good and he is eating well. He also reports drinking the Nepro Shakes and Ensure shakes. Per nursing notes, pt's meal completion has varied from 20% to 100%. His weight has dropped from 124 lbs to 112 lbs in the past week, with weight as low as 103 lbs yesterday. Per order history, pt has been refusing Nepro Shakes, and Ensure has been given about 50% of the time due to contraindications or pt refusal. RD discussed pt's weight loss and encouraged him to continue eating well and using supplements; recommended drinking Nepro BID and drinking Ensure as needed to supplement meals. Pt denies any additional questions or nutrition concerns at this time.   Labs: glucose ranging 78 to 169 mg/dL, low hemoglobin, low calcium   Diet Order:  DIET DYS 2 Room service appropriate? Yes; Fluid consistency: Nectar Thick; Fluid restriction: 1200 mL  Fluid  Skin:  Wound (see comment) (stage II pressure ulcers on sacrum and ischial tuberosity)  Last BM:  8/8  Height:   Ht Readings from Last 1 Encounters:  02/13/2016  (1.651 m)    Weight:   Wt Readings from Last 1 Encounters:  04/03/2016 112 lb 7 oz (51 kg)    Ideal Body Weight:  59.1 kg  BMI:  Body mass index is 18.71 kg/m.  Estimated Nutritional Needs:   Kcal:  1800-2050  Protein:  85-100 gm  Fluid:  1.7-1.9 L  EDUCATION NEEDS:   No education needs identified at this time  Dorothea Ogle RD, LDN Inpatient Clinical Dietitian Pager: [phone] After Hours Pager: [phone]

## 2016-03-12 NOTE — Progress Notes (Signed)
Pt capped with red cap. Doing well with no distress noted at this time. RT will continue to monitor.

## 2016-03-12 NOTE — Progress Notes (Signed)
Pt declined suction again this round and told RT he could tell me if he needed to be suctioned. RT will continue to monitor.

## 2016-03-12 NOTE — Progress Notes (Signed)
PROGRESS NOTE    Tanner Campbell  ZOX:[id number] DOB: 1976/02/17 DOA: 02/15/2016 PCP: No primary care provider on file.   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  He decompensated 6/23 and required intubation. The patient was found to have H. Influenza positive sputum and treated with Rocephin. CXR demonstrated a large right pleural effusion and he underwent a thoracentesis (1L serous fluid removed - exudative by protein). He had difficulty with weaning and was transferred to [facility] on 6/29 orally intubated for ventilator weaning. The patient developed fever and antibiotics were expanded to vancomycin, cefepime and diflucan. He had progressed to weaning on PSV x 4 hours as of 7/3. He self extubated on 7/5. Since that time he required BiPAP off and on. After HD 7/10 his mental status worsened and he became minimally responsive. CXR was consistent with RLL opacification and he was re-intubated. He was re-cultured and also found to have increased Troponin for which Cardiology was consulted. He was placed on LMWH, but had a GIB while on this w/ his hgb dropping as low as 8.4.  On 7/14 he remained ventilator dependent. His sputum cultures grew MSSA and E coli and he was noted to have + blood cultures. Because of these acutely worsening issues it was felt best to transfer him to the ICU at [facility].   Subjective: 8/8 A/O 4, able to speak with Passy-Muir valve in place. Over weekend No acute made to downsize or. Cap.        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 Clinically patient improved will hold on starting any antibiotics. If patient develops fever/bands/left shift will consider restarting. -Resolved  Tracheostomy -Counseled placed consult respiratory therapy continue to attempt to progress patient daily toward decannulation. Will BE capped today  Bacteremia with positive Veillonella species (Gram negative rod) -Completed Flagyl x 2 weeks for bacteremia as per ID recs   Leukocytosis -Resolved: Some organisms and trach aspirate but without any additional signs of infection would not treat.  Bilateral pleural effusions s/p thoracentesis -See acute hypoxic respiratory failure  NSTEMI vs demand ischemia -Cardiology suggest he will need an eventual cardiac cath  -7/27 spoke with Dr. Lyman Bishop cardiology and we agreed would perform cardiac cath early next week. Continue to maximize respiratory status -Strict in and out since admission + 4.3 L -Daily weight [employer]  03/07/2016 1128 03/14/2016 2316 03/15/2016 0500  Weight: 57.5 kg (126 lb 12.2 oz) 47 kg (103 lb 9.9 oz) 51 kg (112 lb 7 oz)  -Manage fluids per HD;See pleural effusion  -Transfuse for hemoglobin<8  Recent Labs Lab 03/07/16 0845 03/16/2016 0351 03/09/16 0431 03/30/2016 0424 03/14/2016 0349  HGB 8.8* 9.3* 10.2*  8.8* 8.9*  -8/3 Cardiology requests clearance by GI before they will perform cardiac catheterization. Have consulted  [facility]  -8/8 per cardiology patient will have cardiac catheterization on 8/9  Small pericardial effusion with friction rub: Pericarditis? -Cardiology following  - ANA negative  - TEE without evidence of endocarditis/vegetations  - 7/27 EKG: Not consistent with pericarditis. - ESR, CRP significantly elevated. Nonspecific considering patient's multiple other medical problems to include renal failure. Will hold on increasing aspirin at this time.  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, Th, S) -Nephrology following   Recent GIB on LMWH -No evidence of acute blood loss a this time,  Anemia of chronic kidney disease + acute blood loss  No evidence of blood loss, but Hgb drifted down to 7.1  - 7/24 transfuse 1U PRBC  -7/27 transfused 1 unit PRBC  DM type II uncontrolled with complications  -5/36 Hemoglobin A1c= 7.7 -Sensitive SSI  Acute Encephalopathy in setting of critical illness -Resolved  Severe malnutrition in context of acute illness -See dysphagia -Ensure TID  Dysphagia -8/3 patient passed swallow test now on dysphagia 3 nectar thick liquid  Chronic back pain -Morphine -Neurontin BID -Air mattress, ensure on rotation at all times    Goals of care -PT recommended CIR  - CIR felt he is more appropriate for LTACH - sister does not want him to go back to [facility] because she didn't feel he was being treated well - plan is for SNF placement, but will have to be decannulated as he is ESRD and requires HD and there are no area SNFs that accept ESRD AND trach patients   -Consult psychiatry on 7/29 to ensure patient has competency. Patient's family unrealistic in their expectations therefore would like patient to make decisions if deemed competent -7/30 meeting with patient/sister/brother-in-law explain plan of care for next week 1.  attempted to Decannulate if possible, 2. Cardiac catheterization 3. Reevaluate swallow.     DVT prophylaxis: Subcutaneous heparin Code Status: Full Family Communication: None Disposition Plan: Vent SNF   Consultants:  PCCM Dr. Lyman Bishop Cardiology  [facility] Nephrology  [facility]   Procedures/Significant Events:  7/21 TEE;- No evidence of endocarditis.-Negative vegetation- Aortic insufficiency is seen, with a central jet. It   is likely due to degenerative changes.   7/24 transfuse 1U PRBC 7/27 PCXR: Cardiomegaly with bilateral from interstitial prominence and bilateral effusions C/W CHF, PNA cannot be excluded.- basilar atelectasis  7/27 transfused 1 unit PRBC 8/2 nuclear medicine myocardial perfusion scan:-no ST segment deviation noted during stress. -Defect 1: medium defect of severe severity present in the basal inferior and mid inferior location.C/W  with prior myocardial infarction.-LVEF=(30-44%).,Global hypokinesis worse in the inferior and inferoseptal regions. -Reversible defect in the inferior and inferoseptal region corresponding to wall motion abnormality seen on echo   Cultures 7/14 MRSA by PCR negative 7/18 blood right hand x2 pending 7/30 blood right hand NGTD 7/30 blood right arm positive coag negative staph(most likely contaminant) 7/30 trach aspirate positive multiple organisms none predominant   Antimicrobials: Cipro 7/12>7/14 Vanc 7/2>7/17 Cefepime 7/14>7/21 Fluconazole 7/7>7/17 Flagyl 7/17 > 8/1   Devices 7/25 uncuffed #6  Shiley>>   LINES / TUBES:  7/29 CorTrak tube placed    Continuous Infusions:         Objective: Vitals:   03/27/2016 0334 03/28/2016 0500 03/22/2016 0753 03/15/2016 0802  BP: 132/78  (!) 150/71 (!) 158/70  Pulse: 85  80 82  Resp: 19  13 (!) 22  Temp:    98.8 F (37.1 C)  TempSrc:    Oral  SpO2: 100%  100% 100%  Weight:  51 kg (112 lb 7 oz)    Height:        Intake/Output Summary (Last 24 hours) at 03/09/2016  0818 Last data filed at 03/22/2016 0316  Gross per 24 hour  Intake               50 ml  Output              500 ml  Net             -450 ml   Filed Weights   03/10/2016 1128 03/10/2016 2316 03/24/2016 0500  Weight: 57.5 kg (126 lb 12.2 oz) 47 kg (103 lb 9.9 oz) 51 kg (112 lb 7 oz)    Examination:  General: A/O 4 , follows commands, positive acute respiratory distress (improved) Eyes: negative scleral hemorrhage, negative anisocoria, negative icterus ENT: Negative Runny nose, negative gingival bleeding, Neck:  Negative scars, masses, torticollis, lymphadenopathy, JVD Lungs: clear to auscultation bilateral, negative wheezes or crackles Cardiovascular:  Regular rhythm and rate without murmur gallop or rub normal S1 and S2 Abdomen: negative abdominal pain, nondistended, positive soft, bowel sounds, no rebound, no ascites, no appreciable mass Extremities: No significant cyanosis, clubbing, or edema bilateral lower extremities Skin: Negative rashes, lesions, ulcers Psychiatric:  Unable to evaluate  Central nervous system:  Cranial nerves II through XII intact, follows commands moves extremities   .     Data Reviewed: Care during the described time interval was provided by me .  I have reviewed this patient's available data, including medical history, events of note, physical examination, and all test results as part of my evaluation. I have personally reviewed and interpreted all radiology studies.  CBC:  Recent Labs Lab 03/07/16 0845 03/18/2016 0351 03/09/16 0431 03/07/2016 0424 04/01/2016 0349  WBC 5.6 5.4 5.2 5.3 6.7  HGB 8.8* 9.3* 10.2* 8.8* 8.9*  HCT 29.2* 32.1* 35.2* 29.3* 30.9*  MCV 92.1 95.3 95.9 95.8 97.2  PLT 191 169 167 114* 704*   Basic Metabolic Panel:  Recent Labs Lab 03/07/16 0834 03/10/16 0712 03/19/2016 0424  NA 135  135 138 134*  K 3.7  3.6 5.4* 4.4  CL 92*  92* 101 97*  CO2 _0 GLUCOSE 101*  105* 78 123*  BUN 42*  41* 37* 19  CREATININE 4.94*   4.96* 5.25* 3.58*  CALCIUM 8.6*  8.7* 8.8* 8.4*  MG 2.4  --   --   PHOS 4.9* 4.1 3.2   GFR: Estimated Creatinine Clearance: 19.6 mL/min (by C-G formula based on SCr of 3.58 mg/dL). Liver Function Tests:  Recent Labs Lab 03/07/16 0834 03/10/16 0712 03/09/2016 0424  ALBUMIN 2.1* 1.9* 1.9*   No results for input(s): LIPASE, AMYLASE in the last 168 hours. No results for input(s): AMMONIA in the last 168 hours. Coagulation Profile:  Recent Labs Lab 03/28/2016 0351  INR 1.10   Cardiac Enzymes: No results for input(s): CKTOTAL, CKMB, CKMBINDEX, TROPONINI in the last 168 hours. BNP (last 3 results) No results for input(s):  PROBNP in the last 8760 hours. HbA1C: No results for input(s): HGBA1C in the last 72 hours. CBG:  Recent Labs Lab 03/05/2016 1233 03/05/2016 1555 03/21/2016 1938 03/06/2016 2314 03/20/2016 0320  GLUCAP 92 169* 104* 164* 80   Lipid Profile: No results for input(s): CHOL, HDL, LDLCALC, TRIG, CHOLHDL, LDLDIRECT in the last 72 hours. Thyroid Function Tests: No results for input(s): TSH, T4TOTAL, FREET4, T3FREE, THYROIDAB in the last 72 hours. Anemia Panel: No results for input(s): VITAMINB12, FOLATE, FERRITIN, TIBC, IRON, RETICCTPCT in the last 72 hours. Urine analysis:    Component Value Date/Time   COLORURINE RED (A) 02/04/2016 1426   APPEARANCEUR CLOUDY (A) 02/04/2016 1426   LABSPEC 1.025 02/04/2016 1426   PHURINE 7.5 02/04/2016 1426   GLUCOSEU 500 (A) 02/04/2016 1426   HGBUR SMALL (A) 02/04/2016 1426   BILIRUBINUR SMALL (A) 02/04/2016 1426   KETONESUR NEGATIVE 02/04/2016 1426   PROTEINUR >300 (A) 02/04/2016 1426   NITRITE NEGATIVE 02/04/2016 1426   LEUKOCYTESUR TRACE (A) 02/04/2016 1426   Sepsis Labs: _0 (procalcitonin:4,lacticidven:4)  ) Recent Results (from the past 240 hour(s))  Culture, blood (routine x 2)     Status: None   Collection Time: 03/03/16  1:11 PM  Result Value Ref Range Status   Specimen Description BLOOD BLOOD RIGHT HAND   Final   Special Requests IN PEDIATRIC BOTTLE 3CC  Final   Culture NO GROWTH 5 DAYS  Final   Report Status 03/26/2016 FINAL  Final  Culture, blood (routine x 2)     Status: Abnormal   Collection Time: 03/03/16  1:16 PM  Result Value Ref Range Status   Specimen Description BLOOD BLOOD RIGHT ARM  Final   Special Requests IN PEDIATRIC BOTTLE 3CC  Final   Culture  Setup Time   Final    GRAM POSITIVE COCCI IN CLUSTERS AEROBIC BOTTLE ONLY CRITICAL RESULT CALLED TO, READ BACK BY AND VERIFIED WITH: M TURNER 03/04/16 @ 42 M VESTAL    Culture (A)  Final    STAPHYLOCOCCUS SPECIES (COAGULASE NEGATIVE) THE SIGNIFICANCE OF ISOLATING THIS ORGANISM FROM A SINGLE SET OF BLOOD CULTURES WHEN MULTIPLE SETS ARE DRAWN IS UNCERTAIN. PLEASE NOTIFY THE MICROBIOLOGY DEPARTMENT WITHIN ONE WEEK IF SPECIATION AND SENSITIVITIES ARE REQUIRED.    Report Status 03/06/2016 FINAL  Final  Blood Culture ID Panel (Reflexed)     Status: Abnormal   Collection Time: 03/03/16  1:16 PM  Result Value Ref Range Status   Enterococcus species NOT DETECTED NOT DETECTED Final   Vancomycin resistance NOT DETECTED NOT DETECTED Final   Listeria monocytogenes NOT DETECTED NOT DETECTED Final   Staphylococcus species DETECTED (A) NOT DETECTED Final    Comment: CRITICAL RESULT CALLED TO, READ BACK BY AND VERIFIED WITH: M TURNER 03/04/16 @ 50 M VESTAL    Staphylococcus aureus NOT DETECTED NOT DETECTED Final   Methicillin resistance NOT DETECTED NOT DETECTED Final   Streptococcus species NOT DETECTED NOT DETECTED Final   Streptococcus agalactiae NOT DETECTED NOT DETECTED Final   Streptococcus pneumoniae NOT DETECTED NOT DETECTED Final   Streptococcus pyogenes NOT DETECTED NOT DETECTED Final   Acinetobacter baumannii NOT DETECTED NOT DETECTED Final   Enterobacteriaceae species NOT DETECTED NOT DETECTED Final   Enterobacter cloacae complex NOT DETECTED NOT DETECTED Final   Escherichia coli NOT DETECTED NOT DETECTED Final   Klebsiella  oxytoca NOT DETECTED NOT DETECTED Final   Klebsiella pneumoniae NOT DETECTED NOT DETECTED Final   Proteus species NOT DETECTED NOT DETECTED Final   Serratia marcescens NOT DETECTED  NOT DETECTED Final   Carbapenem resistance NOT DETECTED NOT DETECTED Final   Haemophilus influenzae NOT DETECTED NOT DETECTED Final   Neisseria meningitidis NOT DETECTED NOT DETECTED Final   Pseudomonas aeruginosa NOT DETECTED NOT DETECTED Final   Candida albicans NOT DETECTED NOT DETECTED Final   Candida glabrata NOT DETECTED NOT DETECTED Final   Candida krusei NOT DETECTED NOT DETECTED Final   Candida parapsilosis NOT DETECTED NOT DETECTED Final   Candida tropicalis NOT DETECTED NOT DETECTED Final  Culture, respiratory (NON-Expectorated)     Status: None   Collection Time: 03/03/16  1:19 PM  Result Value Ref Range Status   Specimen Description TRACHEAL ASPIRATE  Final   Special Requests NONE  Final   Gram Stain   Final    ABUNDANT WBC PRESENT, PREDOMINANTLY PMN RARE SQUAMOUS EPITHELIAL CELLS PRESENT FEW GRAM NEGATIVE COCCI IN PAIRS RARE GRAM NEGATIVE RODS RARE GRAM POSITIVE COCCI IN PAIRS    Culture MULTIPLE ORGANISMS PRESENT, NONE PREDOMINANT  Final   Report Status 03/05/2016 FINAL  Final         Radiology Studies: No results found.      Scheduled Meds: . antiseptic oral rinse  7 mL Mouth Rinse QID  . aspirin  81 mg Oral Daily  . atorvastatin  80 mg Oral q1800  . chlorhexidine gluconate (SAGE KIT)  15 mL Mouth Rinse BID  . darbepoetin (ARANESP) injection - DIALYSIS  200 mcg Intravenous Q Thu-HD  . feeding supplement (ENSURE ENLIVE)  237 mL Oral TID BM  . feeding supplement (NEPRO CARB STEADY)  237 mL Oral BID BM  . ferric gluconate (FERRLECIT/NULECIT) IV  125 mg Intravenous Q T,Th,Sa-HD  . gabapentin  300 mg Oral BID  . insulin aspart  0-9 Units Subcutaneous Q4H  . ipratropium-albuterol  3 mL Nebulization BID  . isosorbide dinitrate  10 mg Oral TID  . metoprolol tartrate  25 mg Oral  BID  . multivitamin  1 tablet Oral QHS  . pantoprazole  40 mg Oral Q1200   Continuous Infusions:     LOS: 25 days    Time spent: 40 minutes    Tesha Archambeau, Geraldo Docker, MD [facility] Pager [phone]   If 7PM-7AM, please contact night-coverage [email or web address] Password [facility] 03/28/2016, 8:18 AM

## 2016-03-12 NOTE — Progress Notes (Signed)
Pt removed cap from trach and put on passy muir speaking valve. Pt educated and cap replaced.

## 2016-03-12 NOTE — Progress Notes (Signed)
Pt requested to disimpact himself.

## 2016-03-12 NOTE — Progress Notes (Signed)
Patient had Passy Muir valve on, on RT's arrival for 8PM check. RT placed cap back on and explained to patient that the cap has to be on continuously for 48hrs to be able to remove the trach. Patient expressed understanding and RT will continue to monitor.

## 2016-03-12 NOTE — Progress Notes (Signed)
Physical Therapy Treatment Patient Details Name: Tanner Campbell MRN: [id number] DOB: [birth year] Today's Date: 04/02/16    History of Present Illness Tanner Campbell is an 40 y.o. 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 He was intubated 6/23-7/5 (self extubated), 7/10-7/16 (self extubated), was then reintubated. Initially, sputum was positive for H influenza and subsequently for MSSA and Escherichia coli and blood cultures growing a gram-negative anaerobic organism. Thoracentesis on right showed borderline exudate by LDH criteria. [name] 7/18; passed swallow study 8/2; cardiolite stress test 8/2 with global hypokinesis EF 40% and prior infarct    PT Comments    Tanner Campbell c/o fatigue and not feeling well upon PT arrival but is agreeable to therapy.  He requires encouragement to participate and mod +2 assist to boost up to standing and repositioning EOB.  Per chart review, sister is declining LTACH, recommendations have been updated to SNF for additional rehab.   Follow Up Recommendations  Supervision/Assistance - 24 hour;SNF     Equipment Recommendations  Other (comment) (TBA)    Recommendations for Other Services       Precautions / Restrictions Precautions Precautions: Fall Precaution Comments: Trach; Dys 2 with nectar thick liquids Restrictions Weight Bearing Restrictions: No    Mobility  Bed Mobility Overal bed mobility: Needs Assistance Bed Mobility: Rolling;Sit to Sidelying Rolling: Supervision       Sit to sidelying: Min assist;Mod assist General bed mobility comments: Pt able to roll with supervision but requires assist bringing LEs into bed   Transfers Overall transfer level: Needs assistance Equipment used: Rolling walker (2 wheeled);2 person hand held assist Transfers: Sit to/from Stand Sit to Stand:  Mod assist;+2 physical assistance;From elevated surface         General transfer comment: Pt required mod +2 assist to boost up to standing and to support once upright.  Pt unable to take steps and required assist with directing hips so pt could sit higher up on bed.   Ambulation/Gait                 Stairs            Wheelchair Mobility    Modified Rankin (Stroke Patients Only)       Balance Overall balance assessment: Needs assistance Sitting-balance support: No upper extremity supported;Feet supported Sitting balance-Leahy Scale: Fair     Standing balance support: Bilateral upper extremity supported;During functional activity Standing balance-Leahy Scale: Poor                      Cognition Arousal/Alertness: Awake/alert Behavior During Therapy: Anxious;Flat affect Overall Cognitive Status: Within Functional Limits for tasks assessed                      Exercises General Exercises - Lower Extremity Quad Sets: Strengthening;Both;10 reps;Supine Straight Leg Raises: Both;10 reps;Supine    General Comments General comments (skin integrity, edema, etc.): VSS      Pertinent Vitals/Pain Pain Assessment: Faces Faces Pain Scale: Hurts even more Pain Location: buttocks Pain Descriptors / Indicators: Grimacing Pain Intervention(s): Limited activity within patient's tolerance;Monitored during session;Repositioned    Home Living                      Prior Function  PT Goals (current goals can now be found in the care plan section) Acute Rehab PT Goals Patient Stated Goal: to get back to bed PT Goal Formulation: With patient Time For Goal Achievement: 03/19/16 Potential to Achieve Goals: Fair Progress towards PT goals: Not progressing toward goals - comment (due to fatigue and pt declining additional activity)    Frequency  Min 3X/week    PT Plan Discharge plan needs to be updated (sister declining LTACH)     Co-evaluation             End of Session Equipment Utilized During Treatment: Oxygen;Gait belt (trach) Activity Tolerance: Patient limited by fatigue Patient left: with call bell/phone within reach;in bed;with bed alarm set     Time: 9604-5409 PT Time Calculation (min) (ACUTE ONLY): 28 min  Charges:  $Therapeutic Exercise: 8-22 mins $Therapeutic Activity: 8-22 mins                    G Codes:      Encarnacion Chu PT, DPT  Pager: [phone] Phone: [phone] 2016/04/05, 5:02 PM

## 2016-03-12 NOTE — Progress Notes (Signed)
PULMONARY / CRITICAL CARE MEDICINE   Name: Tanner Campbell MRN: [id number] DOB: September 23, 1975    ADMISSION DATE:  02/19/2016  REFERRING MD:  Sharyon Medicus -->McClung   CHIEF COMPLAINT:  Prolonged critical illness, failure to wean, recurrent sepsis. PCCM following for trach care.   HPI: 40 year old male w/ ESRD whom we initially admitted to [facility] from [facility] where he was admitted for ongoing care for PNA and AECOPD. His course was complicated by: HCAP, AECOPD, bacteremia (veillonella species), NSTEMI, pericardial friction rub, GIB, and acute encephalopathy and now severe physical deconditioning and protein calorie malnutrition. Was transferred to [facility] per family request & on-going care. Eventually underwent trach on 7/18 was liberated from the vent and transferred to the IM service. He has been making slow progress. His trach now changed to 6 cuffless and ENT has singed off. PCCM was asked to assist w/ vent trach management and timing of decannulation.    SUBJECTIVE:  Pt denies acute complaints - fevers / chills / increased sputum production.  Tolerating PMV with good voice projection.  Plan for LHC once GI evaluation complete per Cards.  Concern for Ensure coming out of trach per RN - not seen with thick liquids  VITAL SIGNS: BP (!) 153/76 (BP Location: Right Arm)   Pulse 88   Temp 98.5 F (36.9 C) (Oral)   Resp 13   Ht 5\' 5"  (1.651 m)   Wt 112 lb 7 oz (51 kg)   SpO2 99%   BMI 18.71 kg/m   VENTILATOR SETTINGS: FiO2 (%):  [28 %] 28 %  INTAKE / OUTPUT: I/O last 3 completed shifts: In: 50 [P.O.:50] Out: 500 [Other:500]  PHYSICAL EXAMINATION: General: Awake, alert, in NAD Neuro: Follows commands, oriented, remains weak, speech and cough are stronger.  HEENT: Trach in place, 6 cuffless. PMV voice quality improved Cardiac: regular, no murmur Chest: normal work of breathing, clear and w/out accessory use.  Abd: soft, non tender Ext: no edema, decreased muscle bulk Skin: multiple areas of  healing excoriations on the lower extremities  LABS:  BMET  Recent Labs Lab 03/07/16 0834 03/10/16 0712 03/29/2016 0424  NA 135  135 138 134*  K 3.7  3.6 5.4* 4.4  CL 92*  92* 101 97*  CO2 31  31 25 28   BUN 42*  41* 37* 19  CREATININE 4.94*  4.96* 5.25* 3.58*  GLUCOSE 101*  105* 78 123*   CBC  Recent Labs Lab 03/09/16 0431 04/04/2016 0424 04/03/2016 0349  WBC 5.2 5.3 6.7  HGB 10.2* 8.8* 8.9*  HCT 35.2* 29.3* 30.9*  PLT 167 114* 127*    STUDIES:  7/13 Echo >> EF 40 to 45%, mod LVH, small pericardial effusion 7/17 CXR >> stable bilateral pleural effusions, stable patchy opacities in the lung bases- pneumonia vs atelectasis 7/17 ANA >> neg  CULTURES: BCX2 7/10: Veillonella species (Gram negative coccobacilli) resp culture 7/10: Moderate SA and E.coli (both pan sensitive)  ANTIBIOTICS: cipro 7/12 >> 7/14 vanc 7/2 >> 7/17 Cefepime 7/14 >> 7/21 Fluconazole 7/7 >> 7/17 Flagyl 7/17 >>   SIGNIFICANT EVENTS: 7/14 Transfer from [facility] to [facility] 7/18 Trach placed 7/22 off vent to SDU. 7/25 PCCM asked to assist w/ trach care  8/04 MBS, SLP recommending dysphagia 2 diet with chin tucks.  Nuclear stress test >> no ischemia   LINES/TUBES: OETT 7/10>>>7/17; 7/17 >>> Right IJ PICC (? 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   Interval: improved strength. Using PMV, reports voice quality improved, able to cough and clear secretions. I have reviewed GI notes. No EGD planned.  Plan Continue to work w/ SLP for improved voice quality Continue aggressive PT  Continue routine trach care  Will go ahead and initiate red capping trial-->only if he were to have difficulty with this would I down-size him. If no issues and has no desaturation or difficulty we can decannulate at the completion of 48 hrs observation    PCCM will follow intermittently for trach / vent needs.  Simonne Martinet ACNP-BC [facility] Pulmonary/Critical Care Pager # [phone] OR # [phone] if no answer

## 2016-03-13 ENCOUNTER — Inpatient Hospital Stay (HOSPITAL_COMMUNITY): Admission: AD | Disposition: E | Payer: Self-pay | Attending: Internal Medicine

## 2016-03-13 LAB — GLUCOSE, CAPILLARY
GLUCOSE-CAPILLARY: 113 mg/dL — AB (ref 65–99)
GLUCOSE-CAPILLARY: 137 mg/dL — AB (ref 65–99)
GLUCOSE-CAPILLARY: 140 mg/dL — AB (ref 65–99)
Glucose-Capillary: 120 mg/dL — ABNORMAL HIGH (ref 65–99)
Glucose-Capillary: 121 mg/dL — ABNORMAL HIGH (ref 65–99)
Glucose-Capillary: 121 mg/dL — ABNORMAL HIGH (ref 65–99)

## 2016-03-13 LAB — CBC
HCT: 38.2 % — ABNORMAL LOW (ref 39.0–52.0)
Hemoglobin: 11 g/dL — ABNORMAL LOW (ref 13.0–17.0)
MCH: 27.9 pg (ref 26.0–34.0)
MCHC: 28.8 g/dL — ABNORMAL LOW (ref 30.0–36.0)
MCV: 97 fL (ref 78.0–100.0)
PLATELETS: 184 10*3/uL (ref 150–400)
RBC: 3.94 MIL/uL — AB (ref 4.22–5.81)
RDW: 18.8 % — ABNORMAL HIGH (ref 11.5–15.5)
WBC: 13 10*3/uL — AB (ref 4.0–10.5)

## 2016-03-13 SURGERY — LEFT HEART CATH AND CORONARY ANGIOGRAPHY

## 2016-03-13 MED ORDER — SODIUM CHLORIDE 0.9 % IV BOLUS (SEPSIS)
250.0000 mL | Freq: Once | INTRAVENOUS | Status: AC
  Administered 2016-03-13: 250 mL via INTRAVENOUS

## 2016-03-13 MED ORDER — ACETAMINOPHEN 325 MG PO TABS
650.0000 mg | ORAL_TABLET | ORAL | Status: DC | PRN
  Administered 2016-03-13 – 2016-03-18 (×2): 650 mg via ORAL
  Filled 2016-03-13 (×2): qty 2

## 2016-03-13 NOTE — Progress Notes (Signed)
OT Cancellation Note  Patient Details Name: Tanner Campbell MRN: [id number] DOB: 1976/03/27   Cancelled Treatment:    Reason Eval/Treat Not Completed: Medical issues which prohibited therapy.  Walked into pt room and pt with trach in hand.  RN immediately notified.   Casie Sturgeon [place], OTR/L [phone]   [name] 05-Apr-2016, 1:03 PM

## 2016-03-13 NOTE — Care Management Important Message (Signed)
Important Message  Patient Details  Name: Tanner Campbell MRN: [id number] Date of Birth: 08-21-1975   Medicare Important Message Given:  Yes    Bernadette Hoit 03/30/2016, 2:47 PM

## 2016-03-13 NOTE — Progress Notes (Signed)
Tanner Campbell - Stepdown/ICU TEAM  Pike Scantlebury  ZOX:[id number] DOB: 11-05-1975 DOA: 02/19/2016 PCP: No primary care provider on file.    Brief Narrative:  40 y/o M with Hx of MS, DM2, HTN, HLD, ESRD on HD (T/Th/S), COPD, and prior back surgery who initially was admitted to [facility] from 6/21 - 6/29 for acute respiratory failure in the setting of volume overload.  He decompensated 6/23 and required intubation. The patient was found to have H. Influenza positive sputum and treated with Rocephin. CXR demonstrated a large right pleural effusion and he underwent a thoracentesis (1L serous fluid removed - exudative by protein). He had difficulty with weaning and was transferred to [facility] on 6/29 orally intubated for ventilator weaning. The patient developed fever and antibiotics were expanded to vancomycin, cefepime and diflucan. He had progressed to weaning on PSV x 4 hours as of 7/3. He self extubated on 7/5 but required BiPAP off and on thereafter. After HD 7/10his mental status worsened and he became minimally responsive. CXR was consistent with RLL opacification and he was re-intubated. He was re-cultured and also found to have increased Troponin for which Cardiology was consulted. He was placed on LMWH, but had a GIB while on this w/ his hgb dropping as low as 8.4.  On 7/14 he remained ventilator dependent. His sputum cultures grew MSSA and E coli and he was noted to have + blood cultures. Because of these acutely worsening issues it was felt best to transfer him to the ICU at [facility].   Significant Events: 6/21 admitted at [facility] 6/29 transfer to [facility] 7/14 transfer from [facility] to [facility] 7/18 trach placed 8/9 patient pulled out his own trach  Subjective: The patient developed a rectal temperature of 101.5 early this morning, and he also experienced vomiting last night.  Later in the day it was found that he had actually removed his own trach.  At the time of visit his respirations are stable despite his  trach being out.  He complains of feeling "bad in general" but has no specific focus complaints.  He denies any pain in the neck or shortness of breath.    Assessment & Plan:  Acute hypoxic respiratory failure - s/p trach 7/18 - HCAP Ventilator management and trach care per PCCM - completed a course of cefepime - trach now out per pt - PCCM had been performing capping trial   Coag negative Staph species in blood cx Blood cx obtained 7/30 Campbell of 2 + - pt does not appear toxic - afeb w/ normal WBC - most consistent with contaminant  FUO No clear source at present - follow w/o abx coverage for now until source clear - repeat blood cx today   Bacteremia with Veillonella species (Gram negative rod) completed Flagyl x 2 weeks for bacteremia as per ID recs - repeat blood cx today  Bilateral pleural effusions s/p thoracentesis Stable on follow-up chest x-ray 8/8 - no respiratory distress w/ stable oxygenation  NSTEMI vs demand ischemia nuclear medicine stress test noted basal inferior and mid inferior perfusion defecst - Cards planning for cardiac cath but his was postponed due to his acute fever   Small pericardial effusion with friction rub - resolved  ANA negative - TEE without evidence of endocarditis/vegetations - hemodynamically stable - rub has resolved  Mild to moderate aortic regurgitation Hemodynamically stable   ESRD on HD Tue/Th/Sat Nephrology following   Reported recent GIB on LMWH No evidence of acute blood loss a this time -  this occurred while the pt was at [facility] when lovenox was begun during an episode of CP - at this time GI does not feel EGD is warranted  Anemia of chronic kidney disease + acute blood loss  No evidence of blood loss, but Hgb drifted down to 7.Campbell - s/p 1U PRBC 7/24 - Hgb fluctuating mildly - no clinical evidence of GIB at this time - reports of acute blood loss date back to [facility] when pt was placed on lovenox during episode of CP (no records available in Epic)  - Hgb presently climbing  Recent Labs Lab 04/01/2016 0351 03/09/16 0431 03/10/2016 0424 03/25/2016 0349 03/22/2016 0311  HGB 9.3* 10.2* 8.8* 8.9* 11.0*    DM type II w/ hyperglycemia  CBG stable   Acute Encephalopathy in setting of critical illness Resolved  - pt presently A&O and possesses capacity to make his own decisions   Severe malnutrition in context of acute illness Patient is much more alert and has now been cleared for diet   Dysphagia Cleared for modified diet by SLP  Disposition Planning  Pending - difficult to place due to trach AND ESRD on HD - pt now has capacity to make his own decisions regarding placement post hospital - placement should be much easier if he thrives w/o his trach   DVT prophylaxis: SQ heparin  Code Status: FULL CODE Family Communication: no family present at time of exam  Disposition Plan: SDU   Consultants:  PCCM Cardiology  ID Nephrology  ENT  Antimicrobials:  Cipro 7/12>7/14 Vanc 7/2>7/17 Cefepime 7/14>7/21 Fluconazole 7/7>7/17 Flagyl 7/17 > 7/31  Objective: Blood pressure 110/62, pulse 83, temperature 98 F (36.7 C), temperature source Oral, resp. rate (!) 23, height 5' 5"  (Campbell.651 m), weight 55 kg (121 lb 4.Campbell oz), SpO2 99 %.  Intake/Output Summary (Last 24 hours) at 04/02/2016 1446 Last data filed at 03/20/2016 0931  Gross per 24 hour  Intake              847 ml  Output             1500 ml  Net             -653 ml   Filed Weights   03/29/2016 2316 03/17/2016 0500 04/02/2016 0442  Weight: 47 kg (103 lb 9.9 oz) 51 kg (112 lb 7 oz) 55 kg (121 lb 4.Campbell oz)    Examination: Gen:  Alert and conversant Lungs:  CTA th/o despite trach being out - no respiratory distress - no wheeze - no stridor  CV: RRR w/o gallup, rub, or M Abdom:  Thin, NT/ND, soft, bs+, no rebound Ext:  No signif edema - cachectic - little muscle mass   CBC:  Recent Labs Lab 03/10/2016 0351 03/09/16 0431 03/24/2016 0424 03/07/2016 0349 03/14/2016 0311  WBC 5.4 5.2 5.3  6.7 13.0*  HGB 9.3* 10.2* 8.8* 8.9* 11.0*  HCT 32.Campbell* 35.2* 29.3* 30.9* 38.2*  MCV 95.3 95.9 95.8 97.2 97.0  PLT 169 167 114* 127* 656   Basic Metabolic Panel:  Recent Labs Lab 03/07/16 0834 03/10/16 0712 03/18/2016 0424  NA 135  135 138 134*  K 3.7  3.6 5.4* 4.4  CL 92*  92* 101 97*  CO2 31  31 25 28   GLUCOSE 101*  105* 78 123*  BUN 42*  41* 37* 19  CREATININE 4.94*  4.96* 5.25* 3.58*  CALCIUM 8.6*  8.7* 8.8* 8.4*  MG 2.4  --   --   PHOS 4.9*  4.Campbell 3.2     GFR: Estimated Creatinine Clearance: 21.Campbell mL/min (by C-G formula based on SCr of 3.58 mg/dL).  Liver Function Tests:  Recent Labs Lab 03/07/16 0834 03/10/16 0712 03/24/2016 0424  ALBUMIN 2.Campbell* Campbell.9* Campbell.9*    HbA1C: Hgb A1c MFr Bld  Date/Time Value Ref Range Status  02/02/2016 06:50 AM 7.7 (H) 4.8 - 5.6 % Final    Comment:    (NOTE)         Pre-diabetes: 5.7 - 6.4         Diabetes: >6.4         Glycemic control for adults with diabetes: <7.0   01/25/2016 04:32 AM 8.2 (H) 4.0 - 6.0 % Final    CBG:  Recent Labs Lab 03/14/2016 2006 03/15/2016 2349 04/02/2016 0421 03/25/2016 0802 03/25/2016 1211  GLUCAP 86 137* 120* 121* 121*     Scheduled Meds: . antiseptic oral rinse  7 mL Mouth Rinse QID  . aspirin  81 mg Oral Daily  . atorvastatin  80 mg Oral q1800  . chlorhexidine gluconate (SAGE KIT)  15 mL Mouth Rinse BID  . darbepoetin (ARANESP) injection - DIALYSIS  200 mcg Intravenous Q Thu-HD  . feeding supplement (ENSURE ENLIVE)  237 mL Oral BID PC  . feeding supplement (NEPRO CARB STEADY)  237 mL Oral BID BM  . ferric gluconate (FERRLECIT/NULECIT) IV  125 mg Intravenous Q T,Th,Sa-HD  . gabapentin  300 mg Oral BID  . insulin aspart  0-9 Units Subcutaneous Q4H  . ipratropium-albuterol  3 mL Nebulization BID  . isosorbide dinitrate  10 mg Oral TID  . metoprolol tartrate  25 mg Oral BID  . multivitamin  Campbell tablet Oral QHS  . pantoprazole  40 mg Oral Q1200     LOS: 26 days    Cherene Altes, MD [facility] Office  [phone] Pager - Text Page per Amion as per below:  On-Call/Text Page:      [email or web address]      password [email or web address]  If 7PM-7AM, please contact night-coverage [email or web address] Password [email or web address] 03/26/2016, 2:46 PM

## 2016-03-13 NOTE — Progress Notes (Signed)
Pt removed trach himself. RN notifited RT. RT called CCM and spoke with Dr. Vassie Loll. Per MD okay to leave trach out if no distress noted. Stoma covered with gauze, pt left on 2L [place] with SATs 100% and no distress noted. RT will continue to monitor.

## 2016-03-13 NOTE — Progress Notes (Signed)
Speech Language Pathology Treatment: Dysphagia  Patient Details Name: Tanner Campbell MRN: [id number] DOB: [birth year] Today's Date: 03/30/2016 Time: 4098-1191 SLP Time Calculation (min) (ACUTE ONLY): 14 min  Assessment / Plan / Recommendation Clinical Impression  Pt seen this afternoon for dysphagia treatment. Note that pt pulled his trach out earlier today and now remains decannulated. Pt assisted with repositioning for safer PO intake and instructed to apply gentle pressure to occlude stoma to restore adequate pressures for swallowing. Min cues provided for use of chin tuck as well. One delayed cough noted after PO intake. CXR from previous date is stable. Recommend to continue current diet. Will continue to follow for readiness to repeat MBS.   HPI HPI: 40 year old male admitted 02/24/2016 due to recurrent sepsis, failure to wean, multiple intubations. PMH significant forDM2, HTN, HLD, ESRD on HD, COPD. Trach placed 02/10/2016.       SLP Plan  Goals updated (pt now without trach)     Recommendations  Diet recommendations: Dysphagia 2 (fine chop);Nectar-thick liquid Liquids provided via: Cup Medication Administration: Crushed with puree Supervision: Patient able to self feed;Full supervision/cueing for compensatory strategies Compensations: Slow rate;Small sips/bites;Multiple dry swallows after each bite/sip;Chin tuck Postural Changes and/or Swallow Maneuvers: Seated upright 90 degrees;Upright 30-60 min after meal;Chin tuck             Oral Care Recommendations: Oral care BID Follow up Recommendations: Skilled Nursing facility Plan: Goals updated (pt now without trach)     GO                Maxcine Ham 03/25/2016, 3:57 PM  Maxcine Ham, M.A. CCC-SLP [phone]

## 2016-03-13 NOTE — Progress Notes (Addendum)
Pt had an episode of projectile vomiting. Pt's vitals were stable and stas in 95%, RN suctioned the pt and did trach care.

## 2016-03-13 NOTE — Progress Notes (Signed)
PT Cancellation Note  Patient Details Name: Tanner Campbell MRN: [id number] DOB: 09-28-1975   Cancelled Treatment:    Reason Eval/Treat Not Completed: Medical issues which prohibited therapy  Attempted to see patient earlier this morning with BP 77/42 and receiving meds from RN. Noted OT's note that pt currently had removed trach and not appropriate for therapy at this time. If pt remains stable, may attempt therapy later today or tomorrow.  Zuria Fosdick 03/27/2016, 1:15 PM Pager [phone]

## 2016-03-13 NOTE — Progress Notes (Signed)
SUBJECTIVE: Pt does not feel well this am. Febrile overnight.   Tele: sinus  BP (!) 99/51 (BP Location: Right Arm)   Pulse 93   Temp 99.8 F (37.7 C) (Oral)   Resp 15   Ht _0  (1.651 m)   Wt 121 lb 4.1 oz (55 kg)   SpO2 100%   BMI 20.18 kg/m   Intake/Output Summary (Last 24 hours) at 03/29/2016 0828 Last data filed at 03/05/2016 0530  Gross per 24 hour  Intake              847 ml  Output             1500 ml  Net             -653 ml    PHYSICAL EXAM General: Thin, ill appearing. NAD. Alert and oriented x 3.  Psych:  Good affect, responds appropriately Neck: No JVD. No masses noted.  Lungs: Clear bilaterally with no wheezes or rhonci noted.  Heart: RRR with no murmurs noted. Rub noted Abdomen: Bowel sounds are present. Soft, non-tender.  Extremities: No lower extremity edema.   LABS: Basic Metabolic Panel:  Recent Labs  03/10/2016 0424  NA 134*  K 4.4  CL 97*  CO2 28  GLUCOSE 123*  BUN 19  CREATININE 3.58*  CALCIUM 8.4*  PHOS 3.2   CBC:  Recent Labs  04/03/2016 0349 03/29/2016 0311  WBC 6.7 13.0*  HGB 8.9* 11.0*  HCT 30.9* 38.2*  MCV 97.2 97.0  PLT 127* 184   Current Meds: . antiseptic oral rinse  7 mL Mouth Rinse QID  . aspirin  81 mg Oral Daily  . atorvastatin  80 mg Oral q1800  . chlorhexidine gluconate (SAGE KIT)  15 mL Mouth Rinse BID  . darbepoetin (ARANESP) injection - DIALYSIS  200 mcg Intravenous Q Thu-HD  . feeding supplement (ENSURE ENLIVE)  237 mL Oral BID PC  . feeding supplement (NEPRO CARB STEADY)  237 mL Oral BID BM  . ferric gluconate (FERRLECIT/NULECIT) IV  125 mg Intravenous Q T,Th,Sa-HD  . gabapentin  300 mg Oral BID  . insulin aspart  0-9 Units Subcutaneous Q4H  . ipratropium-albuterol  3 mL Nebulization BID  . isosorbide dinitrate  10 mg Oral TID  . metoprolol tartrate  25 mg Oral BID  . multivitamin  1 tablet Oral QHS  . pantoprazole  40 mg Oral Q1200     ASSESSMENT AND PLAN: 40 yo male with PMH of HTN, HLD, DM,  Multiple sclerosis, COPD and ESRD on HD since 05/02/2014 presented with acute resp distress, failed multiple extubation attempt. Acute respiratory failure felt to be due to a combination of Haemophilus pneumonia and volume overload. Patient missed at least 3 episodes of hemodialysis before presentation, nephrology managing hemodialysis. Found to have mildly elevated trop on 7/7-7/8. Cardiology consulted 7/10 for elevated troponin and EKG changes. An echocardiogram 02/07/16 showedmildly reduced LVEF of 45-50% however there is akinesis of the mid inferior and inferoseptal myocardium, mild aortic insufficiency and a small pericardial effusion. A repeat EKG 7/13 showed worsening deep T-wave inversions in the antero-lateral leads with high voltage as well as inferior T-wave inversions. Echo 02/15/16 showed slightly worse LV function with focal inferior wall motion abnormality, which could suggest severe ischemia or infarct in this territory. He experienced acute encephalopathy on 03/03/2016 and cardiac work up was deferred until the patient was more stable. He underwent a Lexiscan myoview on 03/06/2016 which resulted as intermediate risk  with a medium defect of severe severity present in the basal inferior and mid inferior location. EF noted at 30-44%. He has had anemia and has been seen by the GI team. EGD is not recommended.   1. Elevated troponin/Abnormal stress test/chest pain:Pt has had chest pain during the hospitalization. Nuclear stress test with reversible defect in the inferior and inferoseptal region corresponding to wall motion abnormality seen on echo. He is very debilitated. His anemia is stable. Hgb 11 this am. GI team is not planning an EDG at this point. We had planned cardiac cath today but pt febrile this am with vomiting overnight. Will delay cath. Continue ASA, statin and lopressor   2. 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 EF 45-50% by echo and 30-44% by nuclear  stress test.  3. ESRD on HD per nephrology   Lauree Chandler  8/9/20178:28 AM

## 2016-03-13 NOTE — Progress Notes (Addendum)
Pt's temp 101.5 rectally. NP K Schorr notified.  250 bolus Nacl  and tylenol given.   Oral temp 99.1 @ 0630

## 2016-03-13 NOTE — Progress Notes (Signed)
Assessment/Plan:  1. ESRD TTS [place] (and PTA in [facility]). Off usual schedule, will resume TTS in AM 2. Chest pain. NSTEMI. 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 Reduced EF 30-44%. Cardiology to do left heart cath  3. GI - No invasive procedure at this time 4. Mild to moderate AI 5. Acute on chronic resp failure - per CCM. On trach collar.  6. Bilateral pleural effusions - s/p thoracentesis T 6/28 1.1 L. BP has limited getting dry enough to help w/pleural effusions (still seen on radiograph 7/30) 7. Bacteremias (veillonella, GCP clusters 7/30) - s/p cefipime, and finished 14 days flagyl.  8. Anemia - CKD + ? BL 9. Severe malnutrition - per primary service. Eating diet now plus Nepro. 10. Disposition - CM called [facility] on [address] [place]) who indicated they will accept pt with trach as long as it is capped. They will also use hoyer lift prn and he will have to be able to sit in the chair for his HD treatments.   Subjective: Interval History: fever and malaise, therefore cath canceled.   Objective: Vital signs in last 24 hours: Temp:  [98 F (36.7 C)-101.5 F (38.6 C)] 98 F (36.7 C) (08/09 1212) Pulse Rate:  [83-136] 83 (08/09 1216) Resp:  [12-24] 23 (08/09 1216) BP: (99-157)/(51-86) 110/62 (08/09 1212) SpO2:  [82 %-100 %] 99 % (08/09 1216) Weight:  [55 kg (121 lb 4.1 oz)] 55 kg (121 lb 4.1 oz) (08/09 0442) Weight change: -3 kg (-6 lb 9.8 oz)  Intake/Output from previous day: 08/08 0701 - 08/09 0700 In: 847 [P.O.:360; NG/GT:237; IV Piggyback:250] Out: 1500 [Emesis/NG output:1500] Intake/Output this shift: Total I/O In: 240 [P.O.:240] Out: -   General appearance: alert and cooperative Throat: trach GI: soft, non-tender; bowel sounds normal; no masses,  no organomegaly Extremities: atrophy w/o edema  Lab Results:  Recent Labs  03/15/2016 0349 04/04/2016 0311   WBC 6.7 13.0*  HGB 8.9* 11.0*  HCT 30.9* 38.2*  PLT 127* 184   BMET:  Recent Labs  03/28/2016 0424  NA 134*  K 4.4  CL 97*  CO2 28  GLUCOSE 123*  BUN 19  CREATININE 3.58*  CALCIUM 8.4*   No results for input(s): PTH in the last 72 hours. Iron Studies: No results for input(s): IRON, TIBC, TRANSFERRIN, FERRITIN in the last 72 hours. Studies/Results: Dg Chest Port 1 View  Result Date: 03/19/2016 CLINICAL DATA:  Pleural effusion. EXAM: PORTABLE CHEST 1 VIEW COMPARISON:  Radiograph of March 02, 2016. FINDINGS: Stable cardiomegaly. Tracheostomy is in grossly good position. No pneumothorax is noted. Stable bibasilar opacities are noted concerning for atelectasis or infiltrates with associated pleural effusions. Bony thorax is unremarkable. IMPRESSION: Stable bibasilar opacities as described above. Electronically Signed   By: Marijo Conception, M.D.   On: 03/26/2016 10:05   Dg Abd Portable 1v  Result Date: 03/20/2016 CLINICAL DATA:  Patient with abdominal pain and nausea and vomiting postprandial. Patient has been in the hospital extended amount of time and now beginning to consume oral foods. EXAM: PORTABLE ABDOMEN - 1 VIEW COMPARISON:  03/03/2016 FINDINGS: Normal bowel gas pattern. There is residual contrast mixed with stool in the rectum and lower sigmoid colon. Mild generalized increased stool burden noted throughout the colon. No convincing renal or ureteral stones. Status post cholecystectomy, stable. There is opacity at both lung bases stable from the prior exam. IMPRESSION: 1. No bowel obstruction. No  acute findings in the abdomen or pelvis. 2. Mild increased stool throughout the colon. Electronically Signed   By: Lajean Manes M.D.   On: 03/14/2016 13:58   Scheduled: . antiseptic oral rinse  7 mL Mouth Rinse QID  . aspirin  81 mg Oral Daily  . atorvastatin  80 mg Oral q1800  . chlorhexidine gluconate (SAGE KIT)  15 mL Mouth Rinse BID  . darbepoetin (ARANESP) injection - DIALYSIS  200  mcg Intravenous Q Thu-HD  . feeding supplement (ENSURE ENLIVE)  237 mL Oral BID PC  . feeding supplement (NEPRO CARB STEADY)  237 mL Oral BID BM  . ferric gluconate (FERRLECIT/NULECIT) IV  125 mg Intravenous Q T,Th,Sa-HD  . gabapentin  300 mg Oral BID  . insulin aspart  0-9 Units Subcutaneous Q4H  . ipratropium-albuterol  3 mL Nebulization BID  . isosorbide dinitrate  10 mg Oral TID  . metoprolol tartrate  25 mg Oral BID  . multivitamin  1 tablet Oral QHS  . pantoprazole  40 mg Oral Q1200     LOS: 26 days   Pami Wool C 03/27/2016,1:03 PM                    k

## 2016-03-13 NOTE — Plan of Care (Signed)
Problem: Education: Goal: Knowledge of about tracheostomy will improve Outcome: Completed/Met Date Met: 04/01/2016 Patient took trach out at 1245 respiratory called PCCM to see if trach to stay out

## 2016-03-14 DIAGNOSIS — J9621 Acute and chronic respiratory failure with hypoxia: Secondary | ICD-10-CM

## 2016-03-14 LAB — CBC WITH DIFFERENTIAL/PLATELET
Basophils Absolute: 0.1 10*3/uL (ref 0.0–0.1)
Basophils Relative: 0 %
EOS PCT: 0 %
Eosinophils Absolute: 0.1 10*3/uL (ref 0.0–0.7)
HEMATOCRIT: 31.3 % — AB (ref 39.0–52.0)
HEMOGLOBIN: 9.2 g/dL — AB (ref 13.0–17.0)
LYMPHS ABS: 0.7 10*3/uL (ref 0.7–4.0)
LYMPHS PCT: 4 %
MCH: 28.7 pg (ref 26.0–34.0)
MCHC: 29.4 g/dL — ABNORMAL LOW (ref 30.0–36.0)
MCV: 97.5 fL (ref 78.0–100.0)
Monocytes Absolute: 1.7 10*3/uL — ABNORMAL HIGH (ref 0.1–1.0)
Monocytes Relative: 9 %
Neutro Abs: 16.2 10*3/uL — ABNORMAL HIGH (ref 1.7–7.7)
Neutrophils Relative %: 87 %
PLATELETS: 212 10*3/uL (ref 150–400)
RBC: 3.21 MIL/uL — AB (ref 4.22–5.81)
RDW: 18.5 % — ABNORMAL HIGH (ref 11.5–15.5)
WBC: 18.7 10*3/uL — AB (ref 4.0–10.5)

## 2016-03-14 LAB — RENAL FUNCTION PANEL
ANION GAP: 12 (ref 5–15)
Albumin: 2.1 g/dL — ABNORMAL LOW (ref 3.5–5.0)
BUN: 37 mg/dL — ABNORMAL HIGH (ref 6–20)
CHLORIDE: 96 mmol/L — AB (ref 101–111)
CO2: 27 mmol/L (ref 22–32)
Calcium: 8.7 mg/dL — ABNORMAL LOW (ref 8.9–10.3)
Creatinine, Ser: 4.5 mg/dL — ABNORMAL HIGH (ref 0.61–1.24)
GFR, EST AFRICAN AMERICAN: 17 mL/min — AB (ref >60)
GFR, EST NON AFRICAN AMERICAN: 15 mL/min — AB (ref >60)
Glucose, Bld: 98 mg/dL (ref 65–99)
POTASSIUM: 5.1 mmol/L (ref 3.5–5.1)
Phosphorus: 4.7 mg/dL — ABNORMAL HIGH (ref 2.5–4.6)
Sodium: 135 mmol/L (ref 135–145)

## 2016-03-14 LAB — GLUCOSE, CAPILLARY
GLUCOSE-CAPILLARY: 82 mg/dL (ref 65–99)
GLUCOSE-CAPILLARY: 120 mg/dL — AB (ref 65–99)
Glucose-Capillary: 102 mg/dL — ABNORMAL HIGH (ref 65–99)
Glucose-Capillary: 125 mg/dL — ABNORMAL HIGH (ref 65–99)
Glucose-Capillary: 76 mg/dL (ref 65–99)
Glucose-Capillary: 90 mg/dL (ref 65–99)
Glucose-Capillary: 90 mg/dL (ref 65–99)

## 2016-03-14 LAB — MAGNESIUM: Magnesium: 1.9 mg/dL (ref 1.7–2.4)

## 2016-03-14 MED ORDER — DARBEPOETIN ALFA 200 MCG/0.4ML IJ SOSY
PREFILLED_SYRINGE | INTRAMUSCULAR | Status: AC
  Filled 2016-03-14: qty 0.4

## 2016-03-14 MED ORDER — HEPARIN SODIUM (PORCINE) 1000 UNIT/ML DIALYSIS
20.0000 [IU]/kg | INTRAMUSCULAR | Status: DC | PRN
  Filled 2016-03-14: qty 2

## 2016-03-14 MED ORDER — SENNOSIDES-DOCUSATE SODIUM 8.6-50 MG PO TABS
1.0000 | ORAL_TABLET | Freq: Two times a day (BID) | ORAL | Status: DC
  Administered 2016-03-14 – 2016-03-22 (×10): 1 via ORAL
  Filled 2016-03-14 (×10): qty 1

## 2016-03-14 MED ORDER — LIDOCAINE HCL (PF) 1 % IJ SOLN: 5.0000 mL | INTRAMUSCULAR | Status: DC | PRN

## 2016-03-14 MED ORDER — BISACODYL 10 MG RE SUPP
10.0000 mg | Freq: Once | RECTAL | Status: AC
  Administered 2016-03-14: 10 mg via RECTAL
  Filled 2016-03-14: qty 1

## 2016-03-14 MED ORDER — SODIUM CHLORIDE 0.9 % IV SOLN: 100.0000 mL | INTRAVENOUS | Status: DC | PRN

## 2016-03-14 MED ORDER — POLYETHYLENE GLYCOL 3350 17 G PO PACK
17.0000 g | PACK | Freq: Every day | ORAL | Status: DC
  Administered 2016-03-14 – 2016-03-22 (×5): 17 g via ORAL
  Filled 2016-03-14 (×6): qty 1

## 2016-03-14 MED ORDER — ALTEPLASE 2 MG IJ SOLR: 2.0000 mg | Freq: Once | INTRAMUSCULAR | Status: DC | PRN

## 2016-03-14 MED ORDER — HEPARIN SODIUM (PORCINE) 1000 UNIT/ML DIALYSIS
1000.0000 [IU] | INTRAMUSCULAR | Status: DC | PRN
  Filled 2016-03-14: qty 1

## 2016-03-14 MED ORDER — DARBEPOETIN ALFA 200 MCG/0.4ML IJ SOSY
200.0000 ug | PREFILLED_SYRINGE | INTRAMUSCULAR | Status: DC
  Administered 2016-03-14 – 2016-03-21 (×2): 200 ug via INTRAVENOUS
  Filled 2016-03-14: qty 0.4

## 2016-03-14 MED ORDER — LIDOCAINE-PRILOCAINE 2.5-2.5 % EX CREA
1.0000 "application " | TOPICAL_CREAM | CUTANEOUS | Status: DC | PRN
  Filled 2016-03-14: qty 5

## 2016-03-14 MED ORDER — BISACODYL 5 MG PO TBEC
5.0000 mg | DELAYED_RELEASE_TABLET | Freq: Two times a day (BID) | ORAL | Status: DC
  Administered 2016-03-14 – 2016-03-22 (×10): 5 mg via ORAL
  Filled 2016-03-14 (×10): qty 1

## 2016-03-14 MED ORDER — IPRATROPIUM-ALBUTEROL 0.5-2.5 (3) MG/3ML IN SOLN
3.0000 mL | Freq: Three times a day (TID) | RESPIRATORY_TRACT | Status: DC
  Administered 2016-03-14 – 2016-03-22 (×23): 3 mL via RESPIRATORY_TRACT
  Filled 2016-03-14 (×24): qty 3

## 2016-03-14 MED ORDER — MORPHINE SULFATE (PF) 2 MG/ML IV SOLN
INTRAVENOUS | Status: AC
  Administered 2016-03-14: 2 mg via INTRAVENOUS
  Filled 2016-03-14: qty 1

## 2016-03-14 MED ORDER — PENTAFLUOROPROP-TETRAFLUOROETH EX AERO: 1.0000 "application " | INHALATION_SPRAY | CUTANEOUS | Status: DC | PRN

## 2016-03-14 MED ORDER — VANCOMYCIN HCL 500 MG IV SOLR
500.0000 mg | INTRAVENOUS | Status: DC
  Filled 2016-03-14 (×3): qty 500

## 2016-03-14 MED ORDER — VANCOMYCIN HCL IN DEXTROSE 1-5 GM/200ML-% IV SOLN
1000.0000 mg | Freq: Once | INTRAVENOUS | Status: AC
  Administered 2016-03-14: 1000 mg via INTRAVENOUS
  Filled 2016-03-14: qty 200

## 2016-03-14 NOTE — Procedures (Signed)
Tolerating hemodialysis.  No hemodynamic issues.  Tanner Campbell is out-self decannulated! Some arterial pressure issues thought positional. Murrel Freet C

## 2016-03-14 NOTE — Progress Notes (Signed)
SUBJECTIVE: Pt says he does not feel well. Aches all over.   Tele: sinus  BP (!) 142/77 (BP Location: Right Arm)   Pulse 94   Temp 99.1 F (37.3 C) (Oral)   Resp (!) 32   Ht 5' 5"  (1.651 m)   Wt 123 lb 7.3 oz (56 kg)   SpO2 100%   BMI 20.54 kg/m   Intake/Output Summary (Last 24 hours) at 03/14/16 0724 Last data filed at 03/14/16 0600  Gross per 24 hour  Intake              365 ml  Output                0 ml  Net              365 ml    PHYSICAL EXAM General: Thin, cachectic. Sleeping but awakens easily. NAD Alert and oriented x 3.  Psych:  Good affect, responds appropriately Neck: No JVD. No masses noted.  Lungs: Clear bilaterally with no wheezes or rhonci noted.  Heart: RRR with no murmurs noted. Abdomen: Bowel sounds are present. Soft, non-tender.  Extremities: No lower extremity edema.   LABS: Basic Metabolic Panel: No results for input(s): NA, K, CL, CO2, GLUCOSE, BUN, CREATININE, CALCIUM, MG, PHOS in the last 72 hours. CBC:  Recent Labs  03/27/2016 0349 04/04/2016 0311  WBC 6.7 13.0*  HGB 8.9* 11.0*  HCT 30.9* 38.2*  MCV 97.2 97.0  PLT 127* 184    Current Meds: . antiseptic oral rinse  7 mL Mouth Rinse QID  . aspirin  81 mg Oral Daily  . atorvastatin  80 mg Oral q1800  . chlorhexidine gluconate (SAGE KIT)  15 mL Mouth Rinse BID  . darbepoetin (ARANESP) injection - DIALYSIS  200 mcg Intravenous Q Thu-HD  . feeding supplement (ENSURE ENLIVE)  237 mL Oral BID PC  . feeding supplement (NEPRO CARB STEADY)  237 mL Oral BID BM  . ferric gluconate (FERRLECIT/NULECIT) IV  125 mg Intravenous Q T,Th,Sa-HD  . gabapentin  300 mg Oral BID  . insulin aspart  0-9 Units Subcutaneous Q4H  . ipratropium-albuterol  3 mL Nebulization BID  . isosorbide dinitrate  10 mg Oral TID  . metoprolol tartrate  25 mg Oral BID  . multivitamin  1 tablet Oral QHS  . pantoprazole  40 mg Oral Q1200     ASSESSMENT AND PLAN:  40 yo male with PMH of HTN, HLD, DM, Multiple  sclerosis, COPD and ESRD on HD since 05/02/2014 presented with acute resp distress, failed multiple extubation attempt. Acute respiratory failure felt to be due to a combination of Haemophilus pneumonia and volume overload. Patient missed at least 3 episodes of hemodialysis before presentation, nephrology managing hemodialysis. Found to have mildly elevated trop on 7/7-7/8. Cardiology consulted 7/10 for elevated troponin and EKG changes. An echocardiogram 02/07/16 showedmildly reduced LVEF of 45-50% however there is akinesis of the mid inferior and inferoseptal myocardium, mild aortic insufficiency and a small pericardial effusion. A repeat EKG 7/13 showed worsening deep T-wave inversions in the antero-lateral leads with high voltage as well as inferior T-wave inversions. Echo 02/15/16 showed slightly worse LV function with focal inferior wall motion abnormality, which could suggest severe ischemia or infarct in this territory. He experienced acute encephalopathy on 02/27/2016 and cardiac work up was deferred until the patient was more stable. He underwent a Lexiscan myoview on 03/06/2016 which resulted as intermediate risk with a medium defect of severe severity  present in the basal inferior and mid inferior location. EF noted at 30-44%. He has had anemia and has been seen by the GI team. EGD is not recommended.   1. Elevated troponin/Abnormal stress test/chest pain:Pt has had chest pain during the hospitalization but none over the last week. Nuclear stress test with reversible defect in the inferior and inferoseptal region corresponding to wall motion abnormality seen on echo. He is very debilitated. His anemia is stable. GI team is not planning an EDG at this point. We had planned cardiac cath yesterday but he was febrile. He is still having body aches and chills, low grade fevers. His cath is not an urgent issue. Will delay cath for now until he is feeling better. Continue ASA, statin and lopressor.   2.  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 EF 45-50% by echo and 30-44% by nuclear stress test.  3. ESRD on HD per nephrology    Lauree Chandler  8/10/20177:24 AM

## 2016-03-14 NOTE — Progress Notes (Signed)
PULMONARY / CRITICAL CARE MEDICINE   Name: Tanner Campbell MRN: [id number] DOB: 1976/04/20    ADMISSION DATE:  02/11/2016  REFERRING MD:  Sharyon Medicus -->McClung   CHIEF COMPLAINT:  Prolonged critical illness, failure to wean, recurrent sepsis. PCCM following for trach care.   HPI: 40 year old male w/ ESRD whom we initially admitted to [facility] from [facility] where he was admitted for ongoing care for PNA and AECOPD. His course was complicated by: HCAP, AECOPD, bacteremia (veillonella species), NSTEMI, pericardial friction rub, GIB, and acute encephalopathy and now severe physical deconditioning and protein calorie malnutrition. Was transferred to [facility] per family request & on-going care. Eventually underwent trach on 7/18 was liberated from the vent and transferred to the IM service. He has been making slow progress. His trach now changed to 6 cuffless and ENT has singed off. PCCM was asked to assist w/ vent trach management and timing of decannulation.    SUBJECTIVE:  Cleared for D2 (fine chop), nectar thick liquids 8/9.  Pt removed trach 8/9, left out per Dr. Vassie Loll. Pt reports feeling weak / tired, constipation  VITAL SIGNS: BP (!) 142/77 (BP Location: Right Arm)   Pulse 92   Temp 98.3 F (36.8 C) (Oral)   Resp 15   Ht 5\' 5"  (1.651 m)   Wt 121 lb 4.1 oz (55 kg) Comment: bed weight  SpO2 97%   BMI 20.18 kg/m   VENTILATOR SETTINGS:    INTAKE / OUTPUT: I/O last 3 completed shifts: In: 972 [P.O.:485; NG/GT:237; IV Piggyback:250] Out: 1500 [Emesis/NG output:1500]  PHYSICAL EXAMINATION: General: Awake, alert, in NAD Neuro: Follows commands, oriented, remains weak, speech and cough are stronger.  HEENT: stoma clean / covered with dry dressing. Able to phonate with closure of stoma, voice improved Cardiac: regular, no murmur Chest: normal work of breathing, clear and w/out accessory use.  Abd: soft, non tender Ext: no edema, decreased muscle bulk Skin: multiple areas of healing excoriations on  the lower extremities  LABS:  BMET  Recent Labs Lab 03/10/16 0712 03/07/2016 0424  NA 138 134*  K 5.4* 4.4  CL 101 97*  CO2 25 28  BUN 37* 19  CREATININE 5.25* 3.58*  GLUCOSE 78 123*   CBC  Recent Labs Lab 03/05/2016 0424 03/27/2016 0349 03/22/2016 0311  WBC 5.3 6.7 13.0*  HGB 8.8* 8.9* 11.0*  HCT 29.3* 30.9* 38.2*  PLT 114* 127* 184    STUDIES:  7/13 Echo >> EF 40 to 45%, mod LVH, small pericardial effusion 7/17 CXR >> stable bilateral pleural effusions, stable patchy opacities in the lung bases- pneumonia vs atelectasis 7/17 ANA >> neg  CULTURES: BCX2 7/10: Veillonella species (Gram negative coccobacilli) resp culture 7/10: Moderate SA and E.coli (both pan sensitive)  ANTIBIOTICS: cipro 7/12 >> 7/14 vanc 7/2 >> 7/17 Cefepime 7/14 >> 7/21 Fluconazole 7/7 >> 7/17 Flagyl 7/17 >> 7/21  SIGNIFICANT EVENTS: 7/14  Transfer from [facility] to [facility] 7/18  Trach placed 7/22  off vent to SDU. 7/25  PCCM asked to assist w/ trach care  8/04  MBS, SLP recommending dysphagia 2 diet with chin tucks.  Nuclear stress test >> no ischemia 8/09  Pt self de-cannulated, trach left out   LINES/TUBES: OETT 7/10>>>7/17; 7/17 >>> 7/18 Right IJ PICC (? 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  Post trach, self de-cannulated 8/9 and trach left out per RA.     Plan Continue to work w/ SLP for improved voice quality SLP for dietary recommendations Continue aggressive PT efforts Keep trach site covered with protective gauze until closed Clean daily with gentle soap / water and dry Pulmonary hygiene -IS, mobilize  Constipation  Plan Senokot-s QD, hold for dirrhea Dulcolax suppository x1 Defer further mgmt to primary svc  PCCM will sign off.  Please call if new needs arise.    Canary Brim, NP-C [facility] Pgr: [phone] or if no answer [phone] 03/14/2016, 8:43 AM

## 2016-03-14 NOTE — Progress Notes (Addendum)
PROGRESS NOTE    Tanner Campbell  ZCH:[id number] DOB: November 25, 1975 DOA: 03/04/2016 PCP: No primary care provider on file.   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  He decompensated 6/23 and required intubation. The patient was found to have H. Influenza positive sputum and treated with Rocephin. CXR demonstrated a large right pleural effusion and he underwent a thoracentesis (1L serous fluid removed - exudative by protein). He had difficulty with weaning and was transferred to [facility] on 6/29 orally intubated for ventilator weaning. The patient developed fever and antibiotics were expanded to vancomycin, cefepime and diflucan. He had progressed to weaning on PSV x 4 hours as of 7/3. He self extubated on 7/5. Since that time he required BiPAP off and on. After HD 7/10 his mental status worsened and he became minimally responsive. CXR was consistent with RLL opacification and he was re-intubated. He was re-cultured and also found to have increased Troponin for which Cardiology was consulted. He was placed on LMWH, but had a GIB while on this w/ his hgb dropping as low as 8.4.  On 7/14 he remained ventilator dependent. His sputum cultures grew MSSA and E coli and he was noted to have + blood cultures. Because of these acutely worsening issues it was felt best to transfer him to the ICU at [facility]   Subjective: 8/10 A/O 4, able to speak: Decannulated       Assessment & Plan:   Principal Problem:   Acute respiratory failure with hypoxia ([place]) Active Problems:   Sepsis ([place])   ESRD (end stage renal disease) (HCC)   Protein-calorie malnutrition, severe   Acute on chronic systolic CHF (congestive heart failure), NYHA class 3 (HCC)   NSTEMI (non-ST elevated myocardial infarction) ([place])   Bacteremia   Tracheostomy  status (HCC)   End-stage renal disease on hemodialysis ([place])   Intestinal infection due to veillonella   Pericardial effusion   Anemia, chronic disease   Acute blood loss anemia   Severe malnutrition (HCC)   HCAP (healthcare-associated pneumonia)   Aortic regurgitation   Dysphagia   Chronic back pain   Acute respiratory failure ([place])   Encounter for feeding tube placement   COPD exacerbation ([place])    Acute hypoxic respiratory failure - s/p trach 7/18 - HCAP - Completed a course of Cefepime  -DuoNeb QID -Patient decannulated, PCXR 8/11  Tracheostomy -Counseled placed consult respiratory therapy continue to attempt to progress patient daily toward decannulation. Will BE capped today  Bacteremia with positive Veillonella species (Gram negative rod) -Completed Flagyl x 2 weeks for bacteremia as per ID recs   ADDENDUM: Positive 1/2 blood cultures for GPC bacteremia -Given patient's increasing leukocytosis and very frail condition will start vancomycin and await speciation and sensitivities   Leukocytosis -Resolved: Some organisms in trach aspirate but without any additional signs of infection would not treat.  Bilateral pleural effusions s/p thoracentesis -See acute hypoxic respiratory failure  NSTEMI vs demand ischemia -Cardiology suggest he will need an eventual cardiac cath  -7/27 spoke with Dr. Lyman Bishop cardiology and we agreed would perform cardiac cath early next week. Continue to maximize respiratory status -Strict in and out since admission + 2.5 L -Daily weight Filed Weights   03/16/2016 0500 03/05/2016 0442 03/14/16 0315  Weight: 51 kg (112  lb 7 oz) 55 kg (121 lb 4.1 oz) 56 kg (123 lb 7.3 oz)  -Manage fluids per HD;See pleural effusion  -Transfuse for hemoglobin<8  Recent Labs Lab 03/26/2016 0351 03/09/16 0431 03/09/2016 0424 03/09/2016 0349 04/02/2016 0311  HGB 9.3* 10.2* 8.8* 8.9* 11.0*  -8/3 Cardiology requests clearance by GI before they will perform  cardiac catheterization. Have consulted  [facility]  -8/10 though debilitated believe able to withstand Cardiac catheterization. Awaiting cardiology  Small pericardial effusion with friction rub: Pericarditis? -Cardiology following  - ANA negative  - TEE without evidence of endocarditis/vegetations  - 7/27 EKG: Not consistent with pericarditis. - ESR, CRP significantly elevated. Nonspecific considering patient's multiple other medical problems to include renal failure. Will hold on increasing aspirin at this time.  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, Th, S) -Nephrology following   Recent GIB on LMWH -No evidence of acute blood loss a this time,  Anemia of chronic kidney disease + acute blood loss  No evidence of blood loss, but Hgb drifted down to 7.1  - 7/24 transfuse 1U PRBC  -7/27 transfused 1 unit PRBC  DM type II uncontrolled with complications  -8/34 Hemoglobin A1c= 7.7 -Sensitive SSI  Acute Encephalopathy in setting of critical illness -Resolved  Severe malnutrition in context of acute illness -See dysphagia -Ensure TID  Dysphagia -8/3 patient passed swallow test now on dysphagia 3 nectar thick liquid  Chronic back pain -Morphine -Neurontin BID -Air mattress, ensure on rotation at all times    Goals of care -PT recommended CIR  - CIR felt he is more appropriate for LTACH - sister does not want him to go back to [facility] because she didn't feel he was being treated well - plan is for SNF placement, but will have to be decannulated as he is ESRD and requires HD and there are no area SNFs that accept ESRD AND trach patients   -Consult psychiatry on 7/29 to ensure patient has competency. Patient's family unrealistic in their expectations therefore would like patient to make decisions if deemed competent -7/30 meeting with patient/sister/brother-in-law explain plan of care for next week 1. attempted to Decannulate if possible, 2. Cardiac  catheterization 3. Reevaluate swallow.     DVT prophylaxis: Subcutaneous heparin Code Status: Full Family Communication: None Disposition Plan: Vent SNF   Consultants:  PCCM Dr. Lyman Bishop Cardiology  [facility] Nephrology  Dr.Su [facility]   Procedures/Significant Events:  7/21 TEE;- No evidence of endocarditis.-Negative vegetation- Aortic insufficiency is seen, with a central jet. It   is likely due to degenerative changes.   7/24 transfuse 1U PRBC 7/27 PCXR: Cardiomegaly with bilateral from interstitial prominence and bilateral effusions C/W CHF, PNA cannot be excluded.- basilar atelectasis  7/27 transfused 1 unit PRBC 8/2 nuclear medicine myocardial perfusion scan:-no ST segment deviation noted during stress. -Defect 1: medium defect of severe severity present in the basal inferior and mid inferior location.C/W  with prior myocardial infarction.-LVEF=(30-44%).,Global hypokinesis worse in the inferior and inferoseptal regions. -Reversible defect in the inferior and inferoseptal region corresponding to wall motion abnormality seen on echo   Cultures 7/14 MRSA by PCR negative 7/18 blood right hand x2 pending 7/30 blood right hand NGTD 7/30 blood right arm positive coag negative staph(most likely contaminant) 7/30 trach aspirate positive multiple organisms none predominant 8/9 blood right hand positive GPC in clusters 8/9 blood right AC NGTD   Antimicrobials: Cipro 7/12>7/14 Vanc 7/2>7/17 Cefepime 7/14>7/21 Fluconazole 7/7>7/17 Flagyl 7/17 > 8/1 Vancomycin 8/10>>  Devices 7/25 uncuffed #6 [place]>>   LINES / TUBES:  7/29 CorTrak tube placed    Continuous Infusions:         Objective: Vitals:   03/25/2016 2330 03/07/2016 2332 03/14/16 0315 03/14/16 0349  BP: 116/68   (!) 142/77  Pulse: 92 94    Resp:      Temp: 98.7 F (37.1 C)   99.1 F (37.3 C)  TempSrc: Oral   Oral  SpO2: 100% 100%    Weight:   56 kg (123 lb 7.3 oz)   Height:        Intake/Output  Summary (Last 24 hours) at 03/14/16 3762 Last data filed at 03/14/16 0600  Gross per 24 hour  Intake              365 ml  Output                0 ml  Net              365 ml   Filed Weights   03/27/2016 0500 03/05/2016 0442 03/14/16 0315  Weight: 51 kg (112 lb 7 oz) 55 kg (121 lb 4.1 oz) 56 kg (123 lb 7.3 oz)    Examination:  General: A/O 4 , follows commands, positive acute respiratory distress (improved) Eyes: negative scleral hemorrhage, negative anisocoria, negative icterus ENT: Negative Runny nose, negative gingival bleeding, Neck:  Negative scars, masses, torticollis, lymphadenopathy, JVD Lungs: clear to auscultation bilateral, negative wheezes or crackles Cardiovascular:  Regular rhythm and rate without murmur gallop or rub normal S1 and S2 Abdomen: negative abdominal pain, nondistended, positive soft, bowel sounds, no rebound, no ascites, no appreciable mass Extremities: No significant cyanosis, clubbing, or edema bilateral lower extremities Skin: Negative rashes, lesions, ulcers Psychiatric:  Unable to evaluate  Central nervous system:  Cranial nerves II through XII intact, follows commands moves extremities   .     Data Reviewed: Care during the described time interval was provided by me .  I have reviewed this patient's available data, including medical history, events of note, physical examination, and all test results as part of my evaluation. I have personally reviewed and interpreted all radiology studies.  CBC:  Recent Labs Lab 03/10/2016 0351 03/09/16 0431 04/04/2016 0424 03/22/2016 0349 04/01/2016 0311  WBC 5.4 5.2 5.3 6.7 13.0*  HGB 9.3* 10.2* 8.8* 8.9* 11.0*  HCT 32.1* 35.2* 29.3* 30.9* 38.2*  MCV 95.3 95.9 95.8 97.2 97.0  PLT 169 167 114* 127* 831   Basic Metabolic Panel:  Recent Labs Lab 03/07/16 0834 03/10/16 0712 04/02/2016 0424  NA 135  135 138 134*  K 3.7  3.6 5.4* 4.4  CL 92*  92* 101 97*  CO2 _0 GLUCOSE 101*  105* 78 123*  BUN  42*  41* 37* 19  CREATININE 4.94*  4.96* 5.25* 3.58*  CALCIUM 8.6*  8.7* 8.8* 8.4*  MG 2.4  --   --   PHOS 4.9* 4.1 3.2   GFR: Estimated Creatinine Clearance: 21.5 mL/min (by C-G formula based on SCr of 3.58 mg/dL). Liver Function Tests:  Recent Labs Lab 03/07/16 0834 03/10/16 0712 04/04/2016 0424  ALBUMIN 2.1* 1.9* 1.9*   No results for input(s): LIPASE, AMYLASE in the last 168 hours. No results for input(s): AMMONIA in the last 168 hours. Coagulation Profile:  Recent Labs Lab 03/20/2016 0351  INR 1.10   Cardiac Enzymes: No results for input(s): CKTOTAL, CKMB, CKMBINDEX, TROPONINI in the last 168 hours. BNP (  last 3 results) No results for input(s): PROBNP in the last 8760 hours. HbA1C: No results for input(s): HGBA1C in the last 72 hours. CBG:  Recent Labs Lab 03/21/2016 1524 03/18/2016 1958 03/24/2016 2348 03/14/16 0259 03/14/16 0816  GLUCAP 140* 113* 102* 90 90   Lipid Profile: No results for input(s): CHOL, HDL, LDLCALC, TRIG, CHOLHDL, LDLDIRECT in the last 72 hours. Thyroid Function Tests: No results for input(s): TSH, T4TOTAL, FREET4, T3FREE, THYROIDAB in the last 72 hours. Anemia Panel: No results for input(s): VITAMINB12, FOLATE, FERRITIN, TIBC, IRON, RETICCTPCT in the last 72 hours. Urine analysis:    Component Value Date/Time   COLORURINE RED (A) 02/04/2016 1426   APPEARANCEUR CLOUDY (A) 02/04/2016 1426   LABSPEC 1.025 02/04/2016 1426   PHURINE 7.5 02/04/2016 1426   GLUCOSEU 500 (A) 02/04/2016 1426   HGBUR SMALL (A) 02/04/2016 1426   BILIRUBINUR SMALL (A) 02/04/2016 1426   KETONESUR NEGATIVE 02/04/2016 1426   PROTEINUR >300 (A) 02/04/2016 1426   NITRITE NEGATIVE 02/04/2016 1426   LEUKOCYTESUR TRACE (A) 02/04/2016 1426   Sepsis Labs: _0 (procalcitonin:4,lacticidven:4)  ) Recent Results (from the past 240 hour(s))  Culture, blood (routine x 2)     Status: None (Preliminary result)   Collection Time: 03/05/2016  5:00 PM  Result Value Ref  Range Status   Specimen Description BLOOD RIGHT HAND  Final   Special Requests BOTTLES DRAWN AEROBIC AND ANAEROBIC  5CC EAC  Final   Culture PENDING  Incomplete   Report Status PENDING  Incomplete         Radiology Studies: Dg Chest Port 1 View  Result Date: 03/14/2016 CLINICAL DATA:  Pleural effusion. EXAM: PORTABLE CHEST 1 VIEW COMPARISON:  Radiograph of March 02, 2016. FINDINGS: Stable cardiomegaly. Tracheostomy is in grossly good position. No pneumothorax is noted. Stable bibasilar opacities are noted concerning for atelectasis or infiltrates with associated pleural effusions. Bony thorax is unremarkable. IMPRESSION: Stable bibasilar opacities as described above. Electronically Signed   By: Marijo Conception, M.D.   On: 03/15/2016 10:05   Dg Abd Portable 1v  Result Date: 03/24/2016 CLINICAL DATA:  Patient with abdominal pain and nausea and vomiting postprandial. Patient has been in the hospital extended amount of time and now beginning to consume oral foods. EXAM: PORTABLE ABDOMEN - 1 VIEW COMPARISON:  03/03/2016 FINDINGS: Normal bowel gas pattern. There is residual contrast mixed with stool in the rectum and lower sigmoid colon. Mild generalized increased stool burden noted throughout the colon. No convincing renal or ureteral stones. Status post cholecystectomy, stable. There is opacity at both lung bases stable from the prior exam. IMPRESSION: 1. No bowel obstruction. No acute findings in the abdomen or pelvis. 2. Mild increased stool throughout the colon. Electronically Signed   By: Lajean Manes M.D.   On: 03/07/2016 13:58        Scheduled Meds: . antiseptic oral rinse  7 mL Mouth Rinse QID  . aspirin  81 mg Oral Daily  . atorvastatin  80 mg Oral q1800  . chlorhexidine gluconate (SAGE KIT)  15 mL Mouth Rinse BID  . darbepoetin (ARANESP) injection - DIALYSIS  200 mcg Intravenous Q Thu-HD  . feeding supplement (ENSURE ENLIVE)  237 mL Oral BID PC  . feeding supplement (NEPRO CARB  STEADY)  237 mL Oral BID BM  . ferric gluconate (FERRLECIT/NULECIT) IV  125 mg Intravenous Q T,Th,Sa-HD  . gabapentin  300 mg Oral BID  . insulin aspart  0-9 Units Subcutaneous Q4H  . ipratropium-albuterol  3  mL Nebulization BID  . isosorbide dinitrate  10 mg Oral TID  . metoprolol tartrate  25 mg Oral BID  . multivitamin  1 tablet Oral QHS  . pantoprazole  40 mg Oral Q1200   Continuous Infusions:     LOS: 27 days    Time spent: 40 minutes    , Geraldo Docker, MD [facility] Pager [phone]   If 7PM-7AM, please contact night-coverage [email or web address] Password [email or web address] 03/14/2016, 8:24 AM

## 2016-03-14 NOTE — Progress Notes (Signed)
Physical Therapy Treatment Patient Details Name: Tanner Campbell MRN: [id number] DOB: April 22, 1976 Today's Date: 03/14/2016    History of Present Illness Tanner Campbell is an 40 y.o. 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 He was intubated 6/23-7/5 (self extubated), 7/10-7/16 (self extubated), was then reintubated. Initially, sputum was positive for H influenza and subsequently for MSSA and Escherichia coli and blood cultures growing a gram-negative anaerobic organism. Thoracentesis on right showed borderline exudate by LDH criteria. Lurline Idol 7/18; passed swallow study 8/2; cardiolite stress test 8/2 with global hypokinesis EF 40% and prior infarct    PT Comments    Patient in HD all morning. Up in chair on arrival and states he has been up in chair 3-4 hours. Patient requesting assist back to bed, and then decided he needed BSC. Multiple sit to stand and stand-pivots throughout session (moderate assist). Patient hurting from constipation and too fatigued for further activity/exercises.    Follow Up Recommendations  Supervision/Assistance - 24 hour;SNF     Equipment Recommendations  Other (comment) (TBA)    Recommendations for Other Services       Precautions / Restrictions Precautions Precautions: Fall Precaution Comments: Dys 2 with nectar thick liquids Restrictions Weight Bearing Restrictions: No    Mobility  Bed Mobility Overal bed mobility: Needs Assistance Bed Mobility: Sit to Sidelying         Sit to sidelying: Min assist General bed mobility comments: Pt controlled his upper body to lower to his side; required assist to raise his legs fully onto bed  Transfers Overall transfer level: Needs assistance Equipment used: None   Sit to Stand: Mod assist;From elevated surface;Max assist Stand pivot transfers: Mod  assist       General transfer comment: x4; weakest in early to midrange coming to stand; PT in front with gait belt and blocking pts knee  Ambulation/Gait                 Stairs            Wheelchair Mobility    Modified Rankin (Stroke Patients Only)       Balance   Sitting-balance support: No upper extremity supported;Feet supported Sitting balance-Leahy Scale: (P) Good     Standing balance support: Bilateral upper extremity supported Standing balance-Leahy Scale: Poor                      Cognition Arousal/Alertness: Awake/alert Behavior During Therapy: Anxious;Flat affect (anxious to get to [facility]) Overall Cognitive Status: Within Functional Limits for tasks assessed                      Exercises      General Comments General comments (skin integrity, edema, etc.): (P) Assisted recliner to [facility]; BSC to bed      Pertinent Vitals/Pain Pain Assessment: Faces Faces Pain Scale: Hurts whole lot Pain Location: rectum/constipated Pain Descriptors / Indicators: Cramping Pain Intervention(s): Limited activity within patient's tolerance;Repositioned;Other (comment) (assisted to [facility]; RN in to assist with pericare)    Home Living                      Prior Function            PT Goals (current goals can now be found in the  care plan section) Acute Rehab PT Goals Patient Stated Goal: to get back to bed PT Goal Formulation: With patient Time For Goal Achievement: 03/28/16 Potential to Achieve Goals: Good Progress towards PT goals: Goals met and updated - see care plan    Frequency  Min 3X/week    PT Plan Discharge plan needs to be updated (sister declining LTACH)    Co-evaluation             End of Session Equipment Utilized During Treatment: Oxygen;Gait belt (trach) Activity Tolerance: Patient limited by fatigue;Patient limited by pain Patient left: with call bell/phone within reach;in bed;with bed alarm set      Time: 4401-0272 PT Time Calculation (min) (ACUTE ONLY): 34 min  Charges:  $Therapeutic Activity: 23-37 mins                    G Codes:      Kenzleigh Sedam 27-Mar-2016, 5:01 PM  Pager [phone]

## 2016-03-14 NOTE — Progress Notes (Signed)
PHARMACY - PHYSICIAN COMMUNICATION CRITICAL VALUE ALERT - BLOOD CULTURE IDENTIFICATION (BCID)  Results for orders placed or performed during the hospital encounter of 02/21/2016  Blood Culture ID Panel (Reflexed) (Collected: 03/03/2016  1:16 PM)  Result Value Ref Range   Enterococcus species NOT DETECTED NOT DETECTED   Vancomycin resistance NOT DETECTED NOT DETECTED   Listeria monocytogenes NOT DETECTED NOT DETECTED   Staphylococcus species DETECTED (A) NOT DETECTED   Staphylococcus aureus NOT DETECTED NOT DETECTED   Methicillin resistance NOT DETECTED NOT DETECTED   Streptococcus species NOT DETECTED NOT DETECTED   Streptococcus agalactiae NOT DETECTED NOT DETECTED   Streptococcus pneumoniae NOT DETECTED NOT DETECTED   Streptococcus pyogenes NOT DETECTED NOT DETECTED   Acinetobacter baumannii NOT DETECTED NOT DETECTED   Enterobacteriaceae species NOT DETECTED NOT DETECTED   Enterobacter cloacae complex NOT DETECTED NOT DETECTED   Escherichia coli NOT DETECTED NOT DETECTED   Klebsiella oxytoca NOT DETECTED NOT DETECTED   Klebsiella pneumoniae NOT DETECTED NOT DETECTED   Proteus species NOT DETECTED NOT DETECTED   Serratia marcescens NOT DETECTED NOT DETECTED   Carbapenem resistance NOT DETECTED NOT DETECTED   Haemophilus influenzae NOT DETECTED NOT DETECTED   Neisseria meningitidis NOT DETECTED NOT DETECTED   Pseudomonas aeruginosa NOT DETECTED NOT DETECTED   Candida albicans NOT DETECTED NOT DETECTED   Candida glabrata NOT DETECTED NOT DETECTED   Candida krusei NOT DETECTED NOT DETECTED   Candida parapsilosis NOT DETECTED NOT DETECTED   Candida tropicalis NOT DETECTED NOT DETECTED    Name of physician (or Provider) Contacted: [place]  Changes to prescribed antibiotics required: vancomycin  Pollyann Samples, PharmD, BCPS 03/14/2016, 7:07 PM Pager: [phone]

## 2016-03-14 NOTE — Progress Notes (Signed)
Pharmacy Antibiotic Note Tanner Campbell is a 40 y.o. male admitted on 02/21/2016. Pharmacy has been consulted for vancomycin dosing to r/o bacteremia.   Plan: 1. Vancomycin 1 gram IV x 1 now followed by 500 mg post IHD on TTS   Height:  (165.1 cm) Weight: 119 lb 4.3 oz (54.1 kg) IBW/kg (Calculated) : 61.5  Temp (24hrs), Avg:98.7 F (37.1 C), Min:98.3 F (36.8 C), Max:99.1 F (37.3 C)   Recent Labs Lab 03/09/16 0431 03/10/16 0712 03/10/2016 0424 03/22/2016 0349 03/20/2016 0311 03/14/16 0900  WBC 5.2  --  5.3 6.7 13.0* 18.7*  CREATININE  --  5.25* 3.58*  --   --  4.50*    Estimated Creatinine Clearance: 16.5 mL/min (by C-G formula based on SCr of 4.5 mg/dL).    Allergies  Allergen Reactions  . Penicillins Other (See Comments)    Reaction: Unknown    Antimicrobials this admission: 8/9 Vancomycim >>   Dose adjustments this admission: n/a  Microbiology results: 8/9 BCx: GPC, BCID px 7/30 BCx: CONS  Thank you for allowing pharmacy to be a part of this patient's care.  Pollyann Samples, PharmD, BCPS 03/14/2016, 7:05 PM Pager: [phone]

## 2016-03-15 ENCOUNTER — Inpatient Hospital Stay (HOSPITAL_COMMUNITY): Payer: Medicare Other

## 2016-03-15 DIAGNOSIS — IMO0002 Reserved for concepts with insufficient information to code with codable children: Secondary | ICD-10-CM

## 2016-03-15 DIAGNOSIS — E1165 Type 2 diabetes mellitus with hyperglycemia: Secondary | ICD-10-CM

## 2016-03-15 DIAGNOSIS — E118 Type 2 diabetes mellitus with unspecified complications: Secondary | ICD-10-CM

## 2016-03-15 LAB — CBC WITH DIFFERENTIAL/PLATELET
BASOS ABS: 0 10*3/uL (ref 0.0–0.1)
BASOS PCT: 0 %
Eosinophils Absolute: 0 10*3/uL (ref 0.0–0.7)
Eosinophils Relative: 0 %
HEMATOCRIT: 30.3 % — AB (ref 39.0–52.0)
HEMOGLOBIN: 8.7 g/dL — AB (ref 13.0–17.0)
Lymphocytes Relative: 6 %
Lymphs Abs: 0.8 10*3/uL (ref 0.7–4.0)
MCH: 28.2 pg (ref 26.0–34.0)
MCHC: 28.7 g/dL — ABNORMAL LOW (ref 30.0–36.0)
MCV: 98.4 fL (ref 78.0–100.0)
Monocytes Absolute: 1.6 10*3/uL — ABNORMAL HIGH (ref 0.1–1.0)
Monocytes Relative: 13 %
NEUTROS ABS: 10.3 10*3/uL — AB (ref 1.7–7.7)
NEUTROS PCT: 81 %
Platelets: 244 10*3/uL (ref 150–400)
RBC: 3.08 MIL/uL — AB (ref 4.22–5.81)
RDW: 18.6 % — ABNORMAL HIGH (ref 11.5–15.5)
WBC: 12.7 10*3/uL — AB (ref 4.0–10.5)

## 2016-03-15 LAB — BASIC METABOLIC PANEL
Anion gap: 9 (ref 5–15)
BUN: 19 mg/dL (ref 6–20)
CHLORIDE: 96 mmol/L — AB (ref 101–111)
CO2: 29 mmol/L (ref 22–32)
Calcium: 8.5 mg/dL — ABNORMAL LOW (ref 8.9–10.3)
Creatinine, Ser: 3.07 mg/dL — ABNORMAL HIGH (ref 0.61–1.24)
GFR, EST AFRICAN AMERICAN: 27 mL/min — AB (ref >60)
GFR, EST NON AFRICAN AMERICAN: 24 mL/min — AB (ref >60)
Glucose, Bld: 111 mg/dL — ABNORMAL HIGH (ref 65–99)
POTASSIUM: 3.9 mmol/L (ref 3.5–5.1)
SODIUM: 134 mmol/L — AB (ref 135–145)

## 2016-03-15 LAB — MAGNESIUM: MAGNESIUM: 1.8 mg/dL (ref 1.7–2.4)

## 2016-03-15 LAB — GLUCOSE, CAPILLARY
GLUCOSE-CAPILLARY: 115 mg/dL — AB (ref 65–99)
GLUCOSE-CAPILLARY: 145 mg/dL — AB (ref 65–99)
GLUCOSE-CAPILLARY: 221 mg/dL — AB (ref 65–99)
Glucose-Capillary: 104 mg/dL — ABNORMAL HIGH (ref 65–99)
Glucose-Capillary: 241 mg/dL — ABNORMAL HIGH (ref 65–99)

## 2016-03-15 LAB — LACTIC ACID, PLASMA: LACTIC ACID, VENOUS: 0.7 mmol/L (ref 0.5–1.9)

## 2016-03-15 NOTE — Progress Notes (Signed)
Physical Therapy Treatment Patient Details Name: Tanner Campbell MRN: [id number] DOB: 06-12-1976 Today's Date: 03/15/2016    History of Present Illness Tanner Campbell is an 40 y.o. 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 He was intubated 6/23-7/5 (self extubated), 7/10-7/16 (self extubated), was then reintubated. Initially, sputum was positive for H influenza and subsequently for MSSA and Escherichia coli and blood cultures growing a gram-negative anaerobic organism. Thoracentesis on right showed borderline exudate by LDH criteria. Tanner Campbell 7/18; passed swallow study 8/2; cardiolite stress test 8/2 with global hypokinesis EF 40% and prior infarct    PT Comments    Patient able to stand x 17 minutes (in stedy with shins against shinguard/pad). He was able to perform tasks with unilateral and even for short periods no UE support.   Follow Up Recommendations  Supervision/Assistance - 24 hour;SNF     Equipment Recommendations  Other (comment) (TBA)    Recommendations for Other Services Rehab consult     Precautions / Restrictions Precautions Precautions: Fall Precaution Comments: Dys 2 with nectar thick liquids    Mobility  Bed Mobility Overal bed mobility: Needs Assistance Bed Mobility: Sidelying to Sit;Sit to Sidelying   Sidelying to sit: Min assist     Sit to sidelying: Min assist General bed mobility comments: Min A to lift trunk from bed and min A to lift LEs onto bed   Transfers Overall transfer level: Needs assistance Equipment used: Ambulation equipment used Transfers: Sit to/from Stand Sit to Stand: Mod assist;+2 physical assistance         General transfer comment: Pt requires mod A +2 to lift buttocks from bed and assist and increased time to extend hips and knees   Ambulation/Gait                  Stairs            Wheelchair Mobility    Modified Rankin (Stroke Patients Only)       Balance Overall balance assessment: Needs assistance Sitting-balance support: No upper extremity supported Sitting balance-Leahy Scale: Good     Standing balance support: Bilateral upper extremity supported Standing balance-Leahy Scale: Poor Standing balance comment: Pt was able to stand for 15 - 20 seconds in the stedy without UE support. He performed unilateral reaching with each UE x 4., but required unilateral to bil. UE support the remainder of the time                     Cognition Arousal/Alertness: Awake/alert Behavior During Therapy: Flat affect Overall Cognitive Status: Impaired/Different from baseline                 General Comments: Pt told therapists that he did his four hours sitting in recliner this am.  He reports he got up at 330 this am.   However, in discussion with RN, this does not appear to have occurred this am.   unsure is this is due to memory deficit, or pt purposefully providing inaccurate information     Exercises General Exercises - Lower Extremity Mini-Sqauts: AROM;Both;Other reps (comment) (3 (in stedy))    General Comments        Pertinent Vitals/Pain Pain Assessment: Faces Faces Pain Scale: Hurts little more Pain Location: "everywhere" Pain Descriptors / Indicators: Grimacing Pain Intervention(s): Monitored during  session;Repositioned    Home Living                      Prior Function            PT Goals (current goals can now be found in the care plan section) Acute Rehab PT Goals Patient Stated Goal: to go home  Time For Goal Achievement: 03/28/16 Progress towards PT goals: Progressing toward goals    Frequency  Min 3X/week    PT Plan Current plan remains appropriate    Co-evaluation PT/OT/SLP Co-Evaluation/Treatment: Yes Reason for Co-Treatment: Complexity of the patient's impairments (multi-system  involvement);For patient/therapist safety PT goals addressed during session: Mobility/safety with mobility;Balance;Strengthening/ROM OT goals addressed during session: ADL's and self-care     End of Session Equipment Utilized During Treatment: Oxygen;Gait belt Activity Tolerance: Patient tolerated treatment well Patient left: in bed;with call bell/phone within reach;with bed alarm set     Time: 1011-1051 PT Time Calculation (min) (ACUTE ONLY): 40 min  Charges:  $Therapeutic Activity: 23-37 mins                    G Codes:      Maygan Koeller Apr 14, 2016, 3:56 PM Pager [phone]

## 2016-03-15 NOTE — Progress Notes (Signed)
Occupational Therapy Treatment Patient Details Name: Tanner Campbell MRN: [id number] DOB: 1976/01/31 Today's Date: 03/15/2016    History of present illness Tanner Campbell is an 40 y.o. 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 He was intubated 6/23-7/5 (self extubated), 7/10-7/16 (self extubated), was then reintubated. Initially, sputum was positive for H influenza and subsequently for MSSA and Escherichia coli and blood cultures growing a gram-negative anaerobic organism. Thoracentesis on right showed borderline exudate by LDH criteria. Tanner Campbell 7/18; passed swallow study 8/2; cardiolite stress test 8/2 with global hypokinesis EF 40% and prior infarct   OT comments  Pt demonstrates significant improvement with activity tolerance/endurance.  He was able to stand x 17 mins with min guard to min A using stedy.   UE strength improving.  Feel he would benefit from CIR stay prior to returning home.   Follow Up Recommendations  CIR;Supervision/Assistance - 24 hour    Equipment Recommendations  None recommended by OT    Recommendations for Other Services      Precautions / Restrictions Precautions Precautions: Fall Precaution Comments: Dys 2 with nectar thick liquids       Mobility Bed Mobility Overal bed mobility: Needs Assistance Bed Mobility: Sidelying to Sit;Sit to Sidelying   Sidelying to sit: Min assist     Sit to sidelying: Min assist General bed mobility comments: Min A to lift trunk from bed and min A to lift LEs onto bed   Transfers Overall transfer level: Needs assistance Equipment used: Ambulation equipment used Transfers: Sit to/from Stand Sit to Stand: Mod assist;+2 physical assistance         General transfer comment: Pt requires mod A +2 to lift buttocks from bed and assist and increased time to extend hips  and knees     Balance Overall balance assessment: Needs assistance Sitting-balance support: Bilateral upper extremity supported Sitting balance-Leahy Scale: Good     Standing balance support: Bilateral upper extremity supported;No upper extremity supported;Single extremity supported Standing balance-Leahy Scale: Poor Standing balance comment: Pt was able to stand for 15 - 20 seconds in the stedy without UE support.  He performed unilateral reaching with each UE x 4., but required unilateral to bil. UE support the remainder of the time                    ADL Overall ADL's : Needs assistance/impaired                         Toilet Transfer: Moderate assistance;+2 for physical assistance;Stand-pivot Toilet Transfer Details (indicate cue type and reason): mod A +2 to power up into standing using the stedy.   Toileting- Clothing Manipulation and Hygiene: Total assistance;Sit to/from stand Toileting - Clothing Manipulation Details (indicate cue type and reason): Pt incontinent of stool upon standing.  required total A for clean up      Functional mobility during ADLs: Moderate assistance;+2 for physical assistance        Vision                     Perception     Praxis      Cognition   Behavior During Therapy: Flat affect Overall Cognitive Status: Impaired/Different from baseline  General Comments: Pt told therapists that he did his four hours sitting in recliner this am.  He reports he got up at 330 this am.   However, in discussion with RN, this does not appear to have occurred this am.   unsure is this is due to memory deficit, or pt purposefully providing inaccurate information     Extremity/Trunk Assessment               Exercises     Shoulder Instructions       General Comments      Pertinent Vitals/ Pain       Pain Assessment: Faces Faces Pain Scale: Hurts little more Pain Location: generalized Pain  Descriptors / Indicators: Grimacing Pain Intervention(s): Monitored during session  Home Living                                          Prior Functioning/Environment              Frequency Min 2X/week     Progress Toward Goals  OT Goals(current goals can now be found in the care plan section)  Progress towards OT goals: Progressing toward goals (goals updated )  Acute Rehab OT Goals Patient Stated Goal: to go home  OT Goal Formulation: With patient Time For Goal Achievement: 03/29/16 Potential to Achieve Goals: Good ADL Goals Pt Will Perform Grooming: with min assist;standing Pt Will Perform Upper Body Bathing: with min assist;sitting Pt Will Transfer to Toilet: with mod assist;bedside commode;stand pivot transfer Additional ADL Goal #1: Pt will perform sit to/from stands consistently with min assist as a precursor for ADLs   Plan Discharge plan remains appropriate    Co-evaluation    PT/OT/SLP Co-Evaluation/Treatment: Yes Reason for Co-Treatment: Complexity of the patient's impairments (multi-system involvement);For patient/therapist safety   OT goals addressed during session: ADL's and self-care      End of Session Equipment Utilized During Treatment: Oxygen;Gait belt   Activity Tolerance Patient tolerated treatment well   Patient Left in bed;with call bell/phone within reach;with bed alarm set   Nurse Communication Mobility status        Time: 1610-9604 OT Time Calculation (min): 43 min  Charges: OT General Charges $OT Visit: 1 Procedure OT Treatments $Therapeutic Activity: 8-22 mins  Tanner Campbell M 03/15/2016, 2:52 PM

## 2016-03-15 NOTE — Care Management Important Message (Signed)
Important Message  Patient Details  Name: Tanner Campbell MRN: [id number] Date of Birth: June 13, 1976   Medicare Important Message Given:  Yes    Bernadette Hoit 03/15/2016, 1:22 PM

## 2016-03-15 NOTE — Progress Notes (Signed)
SUBJECTIVE: No dyspnea or chest pain.   Tele: sinus  BP (!) 91/42   Pulse 84   Temp 99.6 F (37.6 C) (Oral)   Resp 16   Ht 5' 5"  (1.651 m)   Wt 119 lb 4.3 oz (54.1 kg)   SpO2 100%   BMI 19.85 kg/m   Intake/Output Summary (Last 24 hours) at 03/15/16 0847 Last data filed at 03/15/16 0309  Gross per 24 hour  Intake                0 ml  Output             1000 ml  Net            -1000 ml    PHYSICAL EXAM General: Thin, cachectic. Sleeping but awakens easily. NAD Alert and oriented x 3.  Psych:  Good affect, responds appropriately Neck: No JVD. No masses noted.  Lungs: Clear bilaterally with no wheezes or rhonci noted.  Heart: RRR with no murmurs noted. Abdomen: Bowel sounds are present. Soft, non-tender.  Extremities: No lower extremity edema.   LABS: Basic Metabolic Panel:  Recent Labs  03/14/16 0900  NA 135  K 5.1  CL 96*  CO2 27  GLUCOSE 98  BUN 37*  CREATININE 4.50*  CALCIUM 8.7*  MG 1.9  PHOS 4.7*   CBC:  Recent Labs  03/30/2016 0311 03/14/16 0900  WBC 13.0* 18.7*  NEUTROABS  --  16.2*  HGB 11.0* 9.2*  HCT 38.2* 31.3*  MCV 97.0 97.5  PLT 184 212   Current Meds: . antiseptic oral rinse  7 mL Mouth Rinse QID  . aspirin  81 mg Oral Daily  . atorvastatin  80 mg Oral q1800  . bisacodyl  5 mg Oral BID  . chlorhexidine gluconate (SAGE KIT)  15 mL Mouth Rinse BID  . darbepoetin (ARANESP) injection - DIALYSIS  200 mcg Intravenous Q Thu-HD  . feeding supplement (ENSURE ENLIVE)  237 mL Oral BID PC  . feeding supplement (NEPRO CARB STEADY)  237 mL Oral BID BM  . ferric gluconate (FERRLECIT/NULECIT) IV  125 mg Intravenous Q T,Th,Sa-HD  . gabapentin  300 mg Oral BID  . insulin aspart  0-9 Units Subcutaneous Q4H  . ipratropium-albuterol  3 mL Nebulization TID  . isosorbide dinitrate  10 mg Oral TID  . metoprolol tartrate  25 mg Oral BID  . multivitamin  1 tablet Oral QHS  . pantoprazole  40 mg Oral Q1200  . polyethylene glycol  17 g Oral Daily    . senna-docusate  1 tablet Oral BID  . [START ON 03/16/2016] vancomycin  500 mg Intravenous Q T,Th,Sa-HD     ASSESSMENT AND PLAN: 40 yo male with PMH of HTN, HLD, DM, Multiple sclerosis, COPD and ESRD on HD since 05/02/2014 presented with acute resp distress, failed multiple extubation attempt. Acute respiratory failure felt to be due to a combination of Haemophilus pneumonia and volume overload. Patient missed at least 3 episodes of hemodialysis before presentation, nephrology managing hemodialysis. Found to have mildly elevated trop on 7/7-7/8. Cardiology consulted 7/10 for elevated troponin and EKG changes. An echocardiogram 02/07/16 showedmildly reduced LVEF of 45-50% however there is akinesis of the mid inferior and inferoseptal myocardium, mild aortic insufficiency and a small pericardial effusion. A repeat EKG 7/13 showed worsening deep T-wave inversions in the antero-lateral leads with high voltage as well as inferior T-wave inversions. Echo 02/15/16 showed slightly worse LV function with focal inferior wall motion  abnormality. TEE 02/13/2016 with no evidence of valvular vegetations. Cardiac workup delayed due to encephalopathy. He underwent a Lexiscan myoview on 03/06/2016 which resulted as intermediate risk with a medium defect of severe severity present in the basal inferior and mid inferior location. EF noted at 30-44%. He has had anemia and has been seen by the GI team. EGD is not recommended. Planning for cardiac cath delayed due to his ongoing medical issues.  1. Elevated troponin/Abnormal stress test/chest pain:Pt has had chest pain during the hospitalization but none over the last week. Nuclear stress test with reversible defect in the inferior and inferoseptal region corresponding to wall motion abnormality seen on echo. He will need cardiac cath to define his coronary anatomy before discharge but this is low priority right now. He is febrile and bacteremic on IV antibiotics again now. His cath  is not an urgent issue. Will delay cath for now until febrile illness, bacteremia resolves. Continue ASA, statin and lopressor.   2. 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 EF 45-50% by echo and 30-44% by nuclear stress test.  3. ESRD on HD per nephrology        Lauree Chandler  8/11/20178:47 AM

## 2016-03-15 NOTE — Progress Notes (Signed)
PROGRESS NOTE    Tanner Campbell  DJT:[id number] DOB: 09/20/1975 DOA: 02/04/2016 PCP: No primary care provider on file.   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  He decompensated 6/23 and required intubation. The patient was found to have H. Influenza positive sputum and treated with Rocephin. CXR demonstrated a large right pleural effusion and he underwent a thoracentesis (1L serous fluid removed - exudative by protein). He had difficulty with weaning and was transferred to [facility] on 6/29 orally intubated for ventilator weaning. The patient developed fever and antibiotics were expanded to vancomycin, cefepime and diflucan. He had progressed to weaning on PSV x 4 hours as of 7/3. He self extubated on 7/5. Since that time he required BiPAP off and on. After HD 7/10 his mental status worsened and he became minimally responsive. CXR was consistent with RLL opacification and he was re-intubated. He was re-cultured and also found to have increased Troponin for which Cardiology was consulted. He was placed on LMWH, but had a GIB while on this w/ his hgb dropping as low as 8.4.  On 7/14 he remained ventilator dependent. His sputum cultures grew MSSA and E coli and he was noted to have + blood cultures. Because of these acutely worsening issues it was felt best to transfer him to the ICU at [facility].   Subjective: 8/11 A/O 4, AFEBRILE OVERNIGHT able to speak: Decannulated. Still some difficulty with PO food. States was able to stand for~30 minutes but needed help rising from the sitting position      Assessment & Plan:   Principal Problem:   Acute respiratory failure with hypoxia ([place]) Active Problems:   Sepsis ([place])   ESRD (end stage renal disease) (HCC)   Protein-calorie malnutrition, severe   Acute on chronic  systolic CHF (congestive heart failure), NYHA class 3 (HCC)   NSTEMI (non-ST elevated myocardial infarction) ([place])   Bacteremia   Tracheostomy status (HCC)   End-stage renal disease on hemodialysis ([place])   Intestinal infection due to veillonella   Pericardial effusion   Anemia, chronic disease   Acute blood loss anemia   Severe malnutrition (HCC)   HCAP (healthcare-associated pneumonia)   Aortic regurgitation   Dysphagia   Chronic back pain   Respiratory failure ([place])   Encounter for feeding tube placement   COPD exacerbation ([place])    Acute hypoxic respiratory failure - s/p trach 7/18 - HCAP - Completed a course of Cefepime  -DuoNeb QID -Patient decannulated, PCXR 8/11  Tracheostomy -Counseled placed consult respiratory therapy continue to attempt to progress patient daily toward decannulation. Will BE capped today  Bacteremia with positive Veillonella species (Gram negative rod) -Completed Flagyl x 2 weeks for bacteremia as per ID recs   Positive 1/2 blood cultures for GPC bacteremia -Given patient's increasing leukocytosis and very frail condition. Continue vancomycin until speciation and susceptibility finalized  Leukocytosis -Resolved: Some organisms in trach aspirate but without any additional signs of infection would not treat.  Bilateral pleural effusions s/p thoracentesis -See acute hypoxic respiratory failure  NSTEMI vs demand ischemia -Cardiology suggest he will need an eventual cardiac cath  -7/27 spoke with Dr. Lyman Bishop cardiology and we agreed would perform cardiac cath early next week. Continue to maximize respiratory status -Strict in and out since admission + 2.9  L -Daily weight Filed Weights   03/14/16 0315 03/14/16 0831 03/14/16 1215  Weight: 56 kg (123 lb 7.3 oz) 55 kg (121 lb 4.1 oz) 54.1 kg (119 lb 4.3 oz)  -Manage fluids per HD;See pleural effusion  -Transfuse for hemoglobin<8  Recent Labs Lab 03/09/16 0431 03/14/2016 0424  04/02/2016 0349 03/07/2016 0311 03/14/16 0900  HGB 10.2* 8.8* 8.9* 11.0* 9.2*  -8/3 Cardiology requests clearance by GI before they will perform cardiac catheterization. Have consulted  [facility]  -8/10 though debilitated believe able to withstand Cardiac catheterization. Awaiting cardiology  Small pericardial effusion with friction rub: Pericarditis? -Cardiology following  - ANA negative  - TEE without evidence of endocarditis/vegetations  - 7/27 EKG: Not consistent with pericarditis. - ESR, CRP significantly elevated. Nonspecific considering patient's multiple other medical problems to include renal failure. Will hold on increasing aspirin at this time.  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, Th, S) -Nephrology following   Recent GIB on LMWH -No evidence of acute blood loss a this time,  Anemia of chronic kidney disease + acute blood loss  No evidence of blood loss, but Hgb drifted down to 7.1  - 7/24 transfuse 1U PRBC  -7/27 transfused 1 unit PRBC  DM type II uncontrolled with complications  -9/47 Hemoglobin A1c= 7.7 -Sensitive SSI  Acute Encephalopathy in setting of critical illness -Resolved  Severe malnutrition in context of acute illness -See dysphagia -Ensure TID  Dysphagia -8/3 patient passed swallow test now on dysphagia 3 nectar thick liquid -Hopefully will have a FEES on 8/12 most likely patient still aspirating.  Chronic back pain -Morphine -Neurontin BID -Air mattress, ensure on rotation at all times    Goals of care -PT recommended CIR  - CIR felt he is more appropriate for LTACH - sister does not want him to go back to [facility] because she didn't feel he was being treated well - plan is for SNF placement, but will have to be decannulated as he is ESRD and requires HD and there are no area SNFs that accept ESRD AND trach patients   -Consult psychiatry on 7/29 to ensure patient has competency. Patient's family unrealistic in their  expectations therefore would like patient to make decisions if deemed competent -7/30 meeting with patient/sister/brother-in-law explain plan of care for next week 1. attempted to Decannulate if possible, 2. Cardiac catheterization 3. Reevaluate swallow.     DVT prophylaxis: Subcutaneous heparin Code Status: Full Family Communication: None Disposition Plan: Vent SNF   Consultants:  PCCM Dr. Lyman Bishop Cardiology  [facility] Nephrology  Dr.Su [facility] ENT   Procedures/Significant Events:  7/21 TEE;- No evidence of endocarditis.-Negative vegetation- Aortic insufficiency is seen, with a central jet. It   is likely due to degenerative changes.   7/24 transfuse 1U PRBC 7/27 PCXR: Cardiomegaly with bilateral from interstitial prominence and bilateral effusions C/W CHF, PNA cannot be excluded.- basilar atelectasis  7/27 transfused 1 unit PRBC 8/2 nuclear medicine myocardial perfusion scan:-no ST segment deviation noted during stress. -Defect 1: medium defect of severe severity present in the basal inferior and mid inferior location.C/W  with prior myocardial infarction.-LVEF=(30-44%).,Global hypokinesis worse in the inferior and inferoseptal regions. -Reversible defect in the inferior and inferoseptal region corresponding to wall motion abnormality seen on echo   Cultures 7/14 MRSA by PCR negative 7/18 blood right hand x2 pending 7/30 blood right hand NGTD 7/30 blood right arm positive coag negative staph(most likely contaminant) 7/30 trach aspirate positive multiple organisms none predominant 8/9  blood right hand positive GPC in clusters 8/9 blood right AC NGTD   Antimicrobials: Cipro 7/12>7/14 Vanc 7/2>7/17 Cefepime 7/14>7/21 Fluconazole 7/7>7/17 Flagyl 7/17 > 8/1 Vancomycin 8/10>>  Devices 7/25 uncuffed #6 [place]>>   LINES / TUBES:  7/29 CorTrak tube placed    Continuous Infusions:         Objective: Vitals:   03/15/16 0019 03/15/16 0300 03/15/16 0417 03/15/16  0740  BP:   (!) 91/42   Pulse: 87  83 84  Resp: 15  11 16   Temp:  99.6 F (37.6 C)    TempSrc:  Oral    SpO2: 100%  100% 100%  Weight:      Height:        Intake/Output Summary (Last 24 hours) at 03/15/16 8850 Last data filed at 03/15/16 0309  Gross per 24 hour  Intake                0 ml  Output             1000 ml  Net            -1000 ml   Filed Weights   03/14/16 0315 03/14/16 0831 03/14/16 1215  Weight: 56 kg (123 lb 7.3 oz) 55 kg (121 lb 4.1 oz) 54.1 kg (119 lb 4.3 oz)    Examination:  General: A/O 4 , Sitting on edge of bed eating pudding for speech therapy, follows commands, positive acute respiratory distress (improved) Eyes: negative scleral hemorrhage, negative anisocoria, negative icterus ENT: Negative Runny nose, negative gingival bleeding, Neck:  Negative scars, masses, torticollis, lymphadenopathy, JVD Lungs: clear to auscultation bilateral, negative wheezes or crackles Cardiovascular:  Regular rhythm and rate without murmur gallop or rub normal S1 and S2 Abdomen: negative abdominal pain, nondistended, positive soft, bowel sounds, no rebound, no ascites, no appreciable mass Extremities: No significant cyanosis, clubbing, or edema bilateral lower extremities Skin: Negative rashes, lesions, ulcers Psychiatric:  Unable to evaluate  Central nervous system:  Cranial nerves II through XII intact, follows commands moves extremities   .     Data Reviewed: Care during the described time interval was provided by me .  I have reviewed this patient's available data, including medical history, events of note, physical examination, and all test results as part of my evaluation. I have personally reviewed and interpreted all radiology studies.  CBC:  Recent Labs Lab 03/09/16 0431 03/14/2016 0424 03/17/2016 0349 03/25/2016 0311 03/14/16 0900  WBC 5.2 5.3 6.7 13.0* 18.7*  NEUTROABS  --   --   --   --  16.2*  HGB 10.2* 8.8* 8.9* 11.0* 9.2*  HCT 35.2* 29.3* 30.9* 38.2*  31.3*  MCV 95.9 95.8 97.2 97.0 97.5  PLT 167 114* 127* 184 277   Basic Metabolic Panel:  Recent Labs Lab 03/10/16 0712 03/25/2016 0424 03/14/16 0900  NA 138 134* 135  K 5.4* 4.4 5.1  CL 101 97* 96*  CO2 25 28 27   GLUCOSE 78 123* 98  BUN 37* 19 37*  CREATININE 5.25* 3.58* 4.50*  CALCIUM 8.8* 8.4* 8.7*  MG  --   --  1.9  PHOS 4.1 3.2 4.7*   GFR: Estimated Creatinine Clearance: 16.5 mL/min (by C-G formula based on SCr of 4.5 mg/dL). Liver Function Tests:  Recent Labs Lab 03/10/16 0712 03/29/2016 0424 03/14/16 0900  ALBUMIN 1.9* 1.9* 2.1*   No results for input(s): LIPASE, AMYLASE in the last 168 hours. No results for input(s): AMMONIA in the last 168 hours. Coagulation  Profile: No results for input(s): INR, PROTIME in the last 168 hours. Cardiac Enzymes: No results for input(s): CKTOTAL, CKMB, CKMBINDEX, TROPONINI in the last 168 hours. BNP (last 3 results) No results for input(s): PROBNP in the last 8760 hours. HbA1C: No results for input(s): HGBA1C in the last 72 hours. CBG:  Recent Labs Lab 03/14/16 1708 03/14/16 2026 03/14/16 2353 03/15/16 0321 03/15/16 0810  GLUCAP 82 120* 125* 104* 115*   Lipid Profile: No results for input(s): CHOL, HDL, LDLCALC, TRIG, CHOLHDL, LDLDIRECT in the last 72 hours. Thyroid Function Tests: No results for input(s): TSH, T4TOTAL, FREET4, T3FREE, THYROIDAB in the last 72 hours. Anemia Panel: No results for input(s): VITAMINB12, FOLATE, FERRITIN, TIBC, IRON, RETICCTPCT in the last 72 hours. Urine analysis:    Component Value Date/Time   COLORURINE RED (A) 02/04/2016 1426   APPEARANCEUR CLOUDY (A) 02/04/2016 1426   LABSPEC 1.025 02/04/2016 1426   PHURINE 7.5 02/04/2016 1426   GLUCOSEU 500 (A) 02/04/2016 1426   HGBUR SMALL (A) 02/04/2016 1426   BILIRUBINUR SMALL (A) 02/04/2016 1426   KETONESUR NEGATIVE 02/04/2016 1426   PROTEINUR >300 (A) 02/04/2016 1426   NITRITE NEGATIVE 02/04/2016 1426   LEUKOCYTESUR TRACE (A) 02/04/2016  1426   Sepsis Labs: @LABRCNTIP (procalcitonin:4,lacticidven:4)  ) Recent Results (from the past 240 hour(s))  Culture, blood (routine x 2)     Status: None (Preliminary result)   Collection Time: 04/04/2016  5:00 PM  Result Value Ref Range Status   Specimen Description BLOOD RIGHT HAND  Final   Special Requests BOTTLES DRAWN AEROBIC AND ANAEROBIC  5CC   Final   Culture  Setup Time   Final    GRAM POSITIVE COCCI IN CLUSTERS ANAEROBIC BOTTLE ONLY CRITICAL RESULT CALLED TO, READ BACK BY AND VERIFIED WITH: A. JOHNSTON, PHARM AT [place] ON [id number] BY S. YARBROUGH    Culture NO GROWTH < 24 HOURS  Final   Report Status PENDING  Incomplete  Culture, blood (routine x 2)     Status: None (Preliminary result)   Collection Time: 03/07/2016  6:32 PM  Result Value Ref Range Status   Specimen Description BLOOD RIGHT ANTECUBITAL  Final   Special Requests IN PEDIATRIC BOTTLE [place]  Final   Culture NO GROWTH < 24 HOURS  Final   Report Status PENDING  Incomplete         Radiology Studies: Dg Chest Port 1 View  Result Date: 03/15/2016 CLINICAL DATA:  Acute onset of shortness of breath. Assess pneumonia. Initial encounter. EXAM: PORTABLE CHEST 1 VIEW COMPARISON:  Chest radiograph performed 03/27/2016 FINDINGS: Bibasilar airspace opacities raise concern for pneumonia. Underlying vascular congestion is noted. Small bilateral pleural effusions are seen. No pneumothorax is identified. The cardiomediastinal silhouette is normal in size. No acute osseous abnormalities are identified. Cervical spinal fusion hardware is noted. IMPRESSION: Bibasilar airspace opacities raise concern for pneumonia. Underlying vascular congestion noted. Small bilateral pleural effusions seen. Electronically Signed   By: Garald Balding M.D.   On: 03/15/2016 05:08        Scheduled Meds: . antiseptic oral rinse  7 mL Mouth Rinse QID  . aspirin  81 mg Oral Daily  . atorvastatin  80 mg Oral q1800  . bisacodyl  5 mg Oral BID  .  chlorhexidine gluconate (SAGE KIT)  15 mL Mouth Rinse BID  . darbepoetin (ARANESP) injection - DIALYSIS  200 mcg Intravenous Q Thu-HD  . feeding supplement (ENSURE ENLIVE)  237 mL Oral BID PC  . feeding supplement (NEPRO CARB STEADY)  237 mL Oral BID BM  . ferric gluconate (FERRLECIT/NULECIT) IV  125 mg Intravenous Q T,Th,Sa-HD  . gabapentin  300 mg Oral BID  . insulin aspart  0-9 Units Subcutaneous Q4H  . ipratropium-albuterol  3 mL Nebulization TID  . isosorbide dinitrate  10 mg Oral TID  . metoprolol tartrate  25 mg Oral BID  . multivitamin  1 tablet Oral QHS  . pantoprazole  40 mg Oral Q1200  . polyethylene glycol  17 g Oral Daily  . senna-docusate  1 tablet Oral BID  . [START ON 03/16/2016] vancomycin  500 mg Intravenous Q T,Th,Sa-HD   Continuous Infusions:     LOS: 28 days    Time spent: 40 minutes    Takhia Spoon, Geraldo Docker, MD [facility] Pager [phone]   If 7PM-7AM, please contact night-coverage [email or web address] Password [email or web address] 03/15/2016, 9:06 AM

## 2016-03-15 NOTE — Progress Notes (Signed)
Assessment/Plan:  1. ESRD TTS [place] (and PTA in [facility]). Off usual schedule, will resume TTS in AM 2. Chest pain. 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 Reduced EF 30-44%. Cardiology to do left heart cath when stable  3. GI- No invasive procedure at this time 4. Mild to moderate AI 5. Acute on chronic resp failure - per CCM. On trach collar.  6. Bilateral pleural effusions- s/p thoracentesis T 6/28 1.1 L. BP has limited getting dry enough to help w/pleural effusions (still seen on radiograph 7/30) 7. Bacteremias (veillonella, GCP clusters 7/30) - s/p cefipime, and finished 14 days flagyl.  8. Trached 9. Anemia - CKD + ? BL 10. Severe malnutrition - per primary service. Eating diet now plus Nepro. 11. Disposition - CM called [facility] on [address] [place]) who indicated they will accept pt with trach as long as it is capped. They will also use hoyer lift prn and he willhave to be able to sit in the chair for his HD treatments.   Objective: Vital signs in last 24 hours: Temp:  [98.5 F (36.9 C)-99.6 F (37.6 C)] 99.6 F (37.6 C) (08/11 0300) Pulse Rate:  [79-106] 84 (08/11 0740) Resp:  [11-21] 16 (08/11 0740) BP: (91-143)/(42-90) 91/42 (08/11 0417) SpO2:  [98 %-100 %] 100 % (08/11 0740) Weight:  [54.1 kg (119 lb 4.3 oz)] 54.1 kg (119 lb 4.3 oz) (08/10 1215) Weight change: -1 kg (-2 lb 3.3 oz)  Intake/Output from previous day: 08/10 0701 - 08/11 0700 In: 0  Out: 1000  Intake/Output this shift: No intake/output data recorded.  General appearance: alert Extremities: wasting muscularity  Lab Results:  Recent Labs  03/09/2016 0311 03/14/16 0900  WBC 13.0* 18.7*  HGB 11.0* 9.2*  HCT 38.2* 31.3*  PLT 184 212   BMET:  Recent Labs  03/14/16 0900  NA 135  K 5.1  CL 96*  CO2 27  GLUCOSE 98  BUN 37*  CREATININE 4.50*  CALCIUM 8.7*   No results for  input(s): PTH in the last 72 hours. Iron Studies: No results for input(s): IRON, TIBC, TRANSFERRIN, FERRITIN in the last 72 hours. Studies/Results: Dg Chest Port 1 View  Result Date: 03/15/2016 CLINICAL DATA:  Acute onset of shortness of breath. Assess pneumonia. Initial encounter. EXAM: PORTABLE CHEST 1 VIEW COMPARISON:  Chest radiograph performed 03/09/2016 FINDINGS: Bibasilar airspace opacities raise concern for pneumonia. Underlying vascular congestion is noted. Small bilateral pleural effusions are seen. No pneumothorax is identified. The cardiomediastinal silhouette is normal in size. No acute osseous abnormalities are identified. Cervical spinal fusion hardware is noted. IMPRESSION: Bibasilar airspace opacities raise concern for pneumonia. Underlying vascular congestion noted. Small bilateral pleural effusions seen. Electronically Signed   By: Garald Balding M.D.   On: 03/15/2016 05:08    Scheduled: . antiseptic oral rinse  7 mL Mouth Rinse QID  . aspirin  81 mg Oral Daily  . atorvastatin  80 mg Oral q1800  . bisacodyl  5 mg Oral BID  . chlorhexidine gluconate (SAGE KIT)  15 mL Mouth Rinse BID  . darbepoetin (ARANESP) injection - DIALYSIS  200 mcg Intravenous Q Thu-HD  . feeding supplement (ENSURE ENLIVE)  237 mL Oral BID PC  . feeding supplement (NEPRO CARB STEADY)  237 mL Oral BID BM  . ferric gluconate (FERRLECIT/NULECIT) IV  125 mg Intravenous Q T,Th,Sa-HD  . gabapentin  300 mg Oral BID  . insulin aspart  0-9 Units Subcutaneous Q4H  . ipratropium-albuterol  3 mL Nebulization TID  . isosorbide dinitrate  10 mg Oral TID  . metoprolol tartrate  25 mg Oral BID  . multivitamin  1 tablet Oral QHS  . pantoprazole  40 mg Oral Q1200  . polyethylene glycol  17 g Oral Daily  . senna-docusate  1 tablet Oral BID  . [START ON 03/16/2016] vancomycin  500 mg Intravenous Q T,Th,Sa-HD     LOS: 28 days   Yovanni Frenette C 03/15/2016,8:55 AM

## 2016-03-15 NOTE — Progress Notes (Signed)
Speech Language Pathology Treatment: Dysphagia  Patient Details Name: Tanner Campbell MRN: [id number] DOB: [birth year] Today's Date: 03/15/2016 Time: 8185-6314 SLP Time Calculation (min) (ACUTE ONLY): 45 min  Assessment / Plan / Recommendation Clinical Impression  Dysphagia treatment completed during dinner meal.  Pt self-decannulated yesterday; still with open stoma, requiring finger occlusion to prevent loss of air.  Pt with poor recall of need for chin tuck with solids and liquids - min intermittent cues required initially; pt with improved carry-over by end of meal.  Intermittent weak, cough noted initially - decreased episodes of coughing as session progressed.  However,at end of session, chocolate pudding ejected from stoma upon cough.  D/W Dr. Joseph Art.  Recommend cautious and limited PO consumption tonight with FULL supervision and cues; NPO if repeated episode of aspiration/POs at stoma site. Repeat MBS next date.  Suspect ongoing laryngeal trauma after multiple intubations.    HPI HPI: 40 year old male admitted 02-18-2016 due to recurrent sepsis, failure to wean, multiple intubations. PMH significant forDM2, HTN, HLD, ESRD on HD, COPD. Trach placed 02/26/2016.  MBS 8/2 silent aspiration thins and nectar; started on Dys 2 diet with nectar thick liquids with strict use of chin tuck for airway protection.  Self decannulated 03/14/16.      SLP Plan        Recommendations  Diet recommendations: Dysphagia 2 (fine chop);Nectar-thick liquid Liquids provided via: Cup Medication Administration: Crushed with puree Supervision: Patient able to self feed;Full supervision/cueing for compensatory strategies Compensations: Slow rate;Small sips/bites;Multiple dry swallows after each bite/sip;Chin tuck Postural Changes and/or Swallow Maneuvers: Seated upright 90 degrees;Upright 30-60 min after meal;Chin tuck             Oral Care Recommendations: Oral care BID     GO              Tanner Campbell, [place]  CCC/SLP Pager [phone]   Blenda Mounts [place] 03/15/2016, 6:03 PM

## 2016-03-16 LAB — RENAL FUNCTION PANEL
ANION GAP: 14 (ref 5–15)
Albumin: 1.9 g/dL — ABNORMAL LOW (ref 3.5–5.0)
BUN: 33 mg/dL — ABNORMAL HIGH (ref 6–20)
CHLORIDE: 95 mmol/L — AB (ref 101–111)
CO2: 26 mmol/L (ref 22–32)
Calcium: 8.6 mg/dL — ABNORMAL LOW (ref 8.9–10.3)
Creatinine, Ser: 3.73 mg/dL — ABNORMAL HIGH (ref 0.61–1.24)
GFR calc non Af Amer: 19 mL/min — ABNORMAL LOW (ref >60)
GFR, EST AFRICAN AMERICAN: 22 mL/min — AB (ref >60)
GLUCOSE: 143 mg/dL — AB (ref 65–99)
POTASSIUM: 4.1 mmol/L (ref 3.5–5.1)
Phosphorus: 3.8 mg/dL (ref 2.5–4.6)
SODIUM: 135 mmol/L (ref 135–145)

## 2016-03-16 LAB — CULTURE, BLOOD (ROUTINE X 2)

## 2016-03-16 LAB — CBC
HEMATOCRIT: 30.6 % — AB (ref 39.0–52.0)
HEMATOCRIT: 29 % — AB (ref 39.0–52.0)
HEMOGLOBIN: 9.1 g/dL — AB (ref 13.0–17.0)
Hemoglobin: 8.6 g/dL — ABNORMAL LOW (ref 13.0–17.0)
MCH: 28.5 pg (ref 26.0–34.0)
MCH: 28.8 pg (ref 26.0–34.0)
MCHC: 29.7 g/dL — AB (ref 30.0–36.0)
MCHC: 29.7 g/dL — AB (ref 30.0–36.0)
MCV: 95.9 fL (ref 78.0–100.0)
MCV: 97 fL (ref 78.0–100.0)
PLATELETS: 256 10*3/uL (ref 150–400)
Platelets: 268 10*3/uL (ref 150–400)
RBC: 3.19 MIL/uL — AB (ref 4.22–5.81)
RBC: 2.99 MIL/uL — ABNORMAL LOW (ref 4.22–5.81)
RDW: 18 % — ABNORMAL HIGH (ref 11.5–15.5)
RDW: 18.3 % — AB (ref 11.5–15.5)
WBC: 12.9 10*3/uL — ABNORMAL HIGH (ref 4.0–10.5)
WBC: 11.5 10*3/uL — AB (ref 4.0–10.5)

## 2016-03-16 LAB — COMPREHENSIVE METABOLIC PANEL
ALBUMIN: 1.8 g/dL — AB (ref 3.5–5.0)
ALT: 19 U/L (ref 17–63)
AST: 30 U/L (ref 15–41)
Alkaline Phosphatase: 239 U/L — ABNORMAL HIGH (ref 38–126)
Anion gap: 9 (ref 5–15)
BILIRUBIN TOTAL: 0.7 mg/dL (ref 0.3–1.2)
BUN: 16 mg/dL (ref 6–20)
CO2: 30 mmol/L (ref 22–32)
CREATININE: 2.13 mg/dL — AB (ref 0.61–1.24)
Calcium: 8 mg/dL — ABNORMAL LOW (ref 8.9–10.3)
Chloride: 96 mmol/L — ABNORMAL LOW (ref 101–111)
GFR calc Af Amer: 43 mL/min — ABNORMAL LOW (ref >60)
GFR, EST NON AFRICAN AMERICAN: 37 mL/min — AB (ref >60)
GLUCOSE: 96 mg/dL (ref 65–99)
POTASSIUM: 3.1 mmol/L — AB (ref 3.5–5.1)
Sodium: 135 mmol/L (ref 135–145)
TOTAL PROTEIN: 5.3 g/dL — AB (ref 6.5–8.1)

## 2016-03-16 LAB — GLUCOSE, CAPILLARY
GLUCOSE-CAPILLARY: 359 mg/dL — AB (ref 65–99)
Glucose-Capillary: 129 mg/dL — ABNORMAL HIGH (ref 65–99)
Glucose-Capillary: 143 mg/dL — ABNORMAL HIGH (ref 65–99)
Glucose-Capillary: 253 mg/dL — ABNORMAL HIGH (ref 65–99)

## 2016-03-16 MED ORDER — HEPARIN SODIUM (PORCINE) 1000 UNIT/ML DIALYSIS: 20.0000 [IU]/kg | INTRAMUSCULAR | Status: DC | PRN

## 2016-03-16 MED ORDER — SODIUM CHLORIDE 0.9 % IV SOLN: 100.0000 mL | INTRAVENOUS | Status: DC | PRN

## 2016-03-16 MED ORDER — PENTAFLUOROPROP-TETRAFLUOROETH EX AERO: 1.0000 "application " | INHALATION_SPRAY | CUTANEOUS | Status: DC | PRN

## 2016-03-16 MED ORDER — LIDOCAINE-PRILOCAINE 2.5-2.5 % EX CREA
1.0000 "application " | TOPICAL_CREAM | CUTANEOUS | Status: DC | PRN
  Filled 2016-03-16: qty 5

## 2016-03-16 MED ORDER — LIDOCAINE HCL (PF) 1 % IJ SOLN: 5.0000 mL | INTRAMUSCULAR | Status: DC | PRN

## 2016-03-16 MED ORDER — HEPARIN SODIUM (PORCINE) 1000 UNIT/ML DIALYSIS: 1000.0000 [IU] | INTRAMUSCULAR | Status: DC | PRN

## 2016-03-16 MED ORDER — ALTEPLASE 2 MG IJ SOLR: 2.0000 mg | Freq: Once | INTRAMUSCULAR | Status: DC | PRN

## 2016-03-16 MED ORDER — VANCOMYCIN HCL IN DEXTROSE 500-5 MG/100ML-% IV SOLN
INTRAVENOUS | Status: AC
  Administered 2016-03-16: 500 mg
  Filled 2016-03-16: qty 100

## 2016-03-16 NOTE — Progress Notes (Signed)
SLP Cancellation Note  Patient Details Name: Crewe Heathman MRN: [id number] DOB: July 09, 1976   Cancelled MBS due to pt in HD.  Will f/u next date for completion - please hold meal tray if any s/s of aspiration.                                                                                                  Blenda Mounts [place] 03/16/2016, 11:56 AM

## 2016-03-16 NOTE — Progress Notes (Signed)
PROGRESS NOTE    Tanner Campbell  MRN:[id number] DOB: 07/14/1976 DOA: 03/04/2016 PCP: No primary care provider on file.   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  He decompensated 6/23 and required intubation. The patient was found to have H. Influenza positive sputum and treated with Rocephin. CXR demonstrated a large right pleural effusion and he underwent a thoracentesis (1L serous fluid removed - exudative by protein). He had difficulty with weaning and was transferred to [facility] on 6/29 orally intubated for ventilator weaning. The patient developed fever and antibiotics were expanded to vancomycin, cefepime and diflucan. He had progressed to weaning on PSV x 4 hours as of 7/3. He self extubated on 7/5. Since that time he required BiPAP off and on. After HD 7/10 his mental status worsened and he became minimally responsive. CXR was consistent with RLL opacification and he was re-intubated. He was re-cultured and also found to have increased Troponin for which Cardiology was consulted. He was placed on LMWH, but had a GIB while on this w/ his hgb dropping as low as 8.4.  On 7/14 he remained ventilator dependent. His sputum cultures grew MSSA and E coli and he was noted to have + blood cultures. Because of these acutely worsening issues it was felt best to transfer him to the ICU at [facility].   Subjective: 8/12 A/O 4, AFEBRILE OVERNIGHT able to speak: Decannulated. Still some difficulty with PO food. 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   Pericardial effusion   Anemia, chronic disease   Acute blood loss anemia   Severe malnutrition (HCC)   HCAP (healthcare-associated pneumonia)   Aortic regurgitation   Dysphagia   Chronic back pain   Respiratory failure (HCC)   Encounter for feeding tube placement   COPD exacerbation (HCC)   Uncontrolled type 2 diabetes mellitus with complication (HCC)    Acute hypoxic respiratory failure - s/p trach 7/18 - HCAP - Completed a course of Cefepime  -DuoNeb QID -Patient decannulated, PCXR 8/11  Tracheostomy -Counseled placed consult respiratory therapy continue to attempt to progress patient daily toward decannulation. Will BE capped today  Bacteremia with positive Veillonella species (Gram negative rod) -Completed Flagyl x 2 weeks for bacteremia as per ID recs   Positive 1/2 blood cultures for GPC bacteremia -Given patient's increasing leukocytosis and very frail condition. Continue vancomycin until speciation and susceptibility finalized  Leukocytosis -Resolved: Some organisms in trach aspirate but without any additional signs of infection would not treat.  Bilateral pleural effusions s/p thoracentesis -See acute hypoxic respiratory failure  NSTEMI vs demand ischemia -Cardiology suggest he will need an eventual cardiac cath  -7/27 spoke with Dr. Kenneth Hilty cardiology and we agreed would perform cardiac cath early next week. Continue to maximize   respiratory status -Strict in and out since admission + 2.9 L -Daily weight Filed Weights   03/16/16 0041 03/16/16 0500 03/16/16 0830  Weight: 58 kg (127 lb 13.9 oz) 58 kg (127 lb 13.9 oz) 57 kg (125 lb 10.6 oz)  -Manage fluids per HD;See pleural effusion  -Transfuse for  hemoglobin<8  Recent Labs Lab 03/27/2016 0349 03/30/2016 0311 03/14/16 0900 03/15/16 0921 03/16/16 0757  HGB 8.9* 11.0* 9.2* 8.7* 9.1*  -8/3 Cardiology requests clearance by GI before they will perform cardiac catheterization. Have consulted  [facility]  -8/10 though debilitated believe able to withstand Cardiac catheterization. Awaiting cardiology  Small pericardial effusion with friction rub: Pericarditis? -Cardiology following  - ANA negative  - TEE without evidence of endocarditis/vegetations  - 7/27 EKG: Not consistent with pericarditis. - ESR, CRP significantly elevated. Nonspecific considering patient's multiple other medical problems to include renal failure. Will hold on increasing aspirin at this time.  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, Th, S) -Nephrology following   Recent GIB on LMWH -No evidence of acute blood loss a this time,  Anemia of chronic kidney disease + acute blood loss  No evidence of blood loss, but Hgb drifted down to 7.1  - 7/24 transfuse 1U PRBC  -7/27 transfused 1 unit PRBC  DM type II uncontrolled with complications  -6/30 Hemoglobin A1c= 7.7 -Sensitive SSI  Acute Encephalopathy in setting of critical illness -Resolved  Severe malnutrition in context of acute illness -See dysphagia -Ensure TID  Dysphagia -8/3 patient passed swallow test now on dysphagia 3 nectar thick liquid -Hopefully will have a FEES on 8/12 most likely patient still aspirating.  Chronic back pain -Morphine -Neurontin BID -Air mattress, ensure on rotation at all times    Goals of care -PT recommended CIR  - CIR felt he is more appropriate for LTACH - sister does not want him to go back to [facility] because she didn't feel he was being treated well - plan is for SNF placement, but will have to be decannulated as he is ESRD and requires HD and there are no area SNFs that accept ESRD AND trach patients   -Consult psychiatry on 7/29 to ensure  patient has competency. Patient's family unrealistic in their expectations therefore would like patient to make decisions if deemed competent -7/30 meeting with patient/sister/brother-in-law explain plan of care for next week 1. attempted to Decannulate if possible, 2. Cardiac catheterization 3. Reevaluate swallow.     DVT prophylaxis: Subcutaneous heparin Code Status: Full Family Communication: None Disposition Plan: [facility] SNF   Consultants:  PCCM Dr. Kenneth Hilty Cardiology  [facility] Nephrology  Dr.Su Teoh ENT   Procedures/Significant Events:  7/21 TEE;- No evidence of endocarditis.-Negative vegetation- Aortic insufficiency is seen, with a central jet. It   is likely due to degenerative changes.   7/24 transfuse 1U PRBC 7/27 PCXR: Cardiomegaly with bilateral from interstitial prominence and bilateral effusions C/W CHF, PNA cannot be excluded.- basilar atelectasis  7/27 transfused 1 unit PRBC 8/2 nuclear medicine myocardial perfusion scan:-no ST segment deviation noted during stress. -Defect 1: medium defect of severe severity present in the basal inferior and mid inferior location.C/W  with prior myocardial infarction.-LVEF=(30-44%).,Global hypokinesis worse in the inferior and inferoseptal regions. -Reversible defect in the inferior and inferoseptal region corresponding to wall motion abnormality seen on echo   Cultures 7/14 MRSA by PCR negative 7/18 blood right hand x2 pending 7/30 blood right hand NGTD 7/30 blood right arm positive coag negative staph(most likely   contaminant) 7/30 trach aspirate positive multiple organisms none predominant 8/9 blood right hand positive GPC in clusters 8/9 blood right AC NGTD   Antimicrobials: Cipro 7/12>7/14 Vanc 7/2>7/17 Cefepime 7/14>7/21 Fluconazole 7/7>7/17 Flagyl 7/17 > 8/1 Vancomycin 8/10>>  Devices 7/25 uncuffed #6 Shiley>>   LINES / TUBES:  7/29 CorTrak tube placed    Continuous Infusions:          Objective: Vitals:   03/16/16 1406 03/16/16 1515 03/16/16 1933 03/16/16 2006  BP:  (!) 147/65    Pulse:  (!) 106    Resp:  17    Temp:  98.5 F (36.9 C)  99.2 F (37.3 C)  TempSrc:  Oral  Oral  SpO2: 100% 100% 100%   Weight:      Height:        Intake/Output Summary (Last 24 hours) at 03/16/16 2013 Last data filed at 03/16/16 1540  Gross per 24 hour  Intake              240 ml  Output                2 ml  Net              238 ml   Filed Weights   03/16/16 0041 03/16/16 0500 03/16/16 0830  Weight: 58 kg (127 lb 13.9 oz) 58 kg (127 lb 13.9 oz) 57 kg (125 lb 10.6 oz)    Examination:  General: A/O 4 , Sitting on edge of bed eating pudding for speech therapy, follows commands, positive acute respiratory distress (improved) Eyes: negative scleral hemorrhage, negative anisocoria, negative icterus ENT: Negative Runny nose, negative gingival bleeding, Neck:  Negative scars, masses, torticollis, lymphadenopathy, JVD Lungs: clear to auscultation bilateral, negative wheezes or crackles Cardiovascular:  Regular rhythm and rate without murmur gallop or rub normal S1 and S2 Abdomen: negative abdominal pain, nondistended, positive soft, bowel sounds, no rebound, no ascites, no appreciable mass Extremities: No significant cyanosis, clubbing, or edema bilateral lower extremities Skin: Negative rashes, lesions, ulcers Psychiatric:  Unable to evaluate  Central nervous system:  Cranial nerves II through XII intact, follows commands moves extremities   .     Data Reviewed: Care during the described time interval was provided by me .  I have reviewed this patient's available data, including medical history, events of note, physical examination, and all test results as part of my evaluation. I have personally reviewed and interpreted all radiology studies.  CBC:  Recent Labs Lab 04/03/2016 0349  0311 03/14/16 0900 03/15/16 0921 03/16/16 0757  WBC 6.7 13.0* 18.7*  12.7* 12.9*  NEUTROABS  --   --  16.2* 10.3*  --   HGB 8.9* 11.0* 9.2* 8.7* 9.1*  HCT 30.9* 38.2* 31.3* 30.3* 30.6*  MCV 97.2 97.0 97.5 98.4 95.9  PLT 127* 184 212 244 268   Basic Metabolic Panel:  Recent Labs Lab 03/10/16 0712 03/10/2016 0424 03/14/16 0900 03/15/16 0921 03/16/16 0757  NA 138 134* 135 134* 135  K 5.4* 4.4 5.1 3.9 4.1  CL 101 97* 96* 96* 95*  CO2 25 28 27 29 26  GLUCOSE 78 123* 98 111* 143*  BUN 37* 19 37* 19 33*  CREATININE 5.25* 3.58* 4.50* 3.07* 3.73*  CALCIUM 8.8* 8.4* 8.7* 8.5* 8.6*  MG  --   --  1.9 1.8  --   PHOS 4.1 3.2 4.7*  --  3.8   GFR: Estimated Creatinine Clearance: 21 mL/min (by C-G formula based on SCr of 3.73   mg/dL). Liver Function Tests:  Recent Labs Lab 03/10/16 0712 03/30/2016 0424 03/14/16 0900 03/16/16 0757  ALBUMIN 1.9* 1.9* 2.1* 1.9*   No results for input(s): LIPASE, AMYLASE in the last 168 hours. No results for input(s): AMMONIA in the last 168 hours. Coagulation Profile: No results for input(s): INR, PROTIME in the last 168 hours. Cardiac Enzymes: No results for input(s): CKTOTAL, CKMB, CKMBINDEX, TROPONINI in the last 168 hours. BNP (last 3 results) No results for input(s): PROBNP in the last 8760 hours. HbA1C: No results for input(s): HGBA1C in the last 72 hours. CBG:  Recent Labs Lab 03/15/16 1635 03/15/16 2019 03/16/16 0039 03/16/16 0422 03/16/16 1703  GLUCAP 241* 221* 129* 143* 359*   Lipid Profile: No results for input(s): CHOL, HDL, LDLCALC, TRIG, CHOLHDL, LDLDIRECT in the last 72 hours. Thyroid Function Tests: No results for input(s): TSH, T4TOTAL, FREET4, T3FREE, THYROIDAB in the last 72 hours. Anemia Panel: No results for input(s): VITAMINB12, FOLATE, FERRITIN, TIBC, IRON, RETICCTPCT in the last 72 hours. Urine analysis:    Component Value Date/Time   COLORURINE RED (A) 02/04/2016 1426   APPEARANCEUR CLOUDY (A) 02/04/2016 1426   LABSPEC 1.025 02/04/2016 1426   PHURINE 7.5 02/04/2016 1426    GLUCOSEU 500 (A) 02/04/2016 1426   HGBUR SMALL (A) 02/04/2016 1426   BILIRUBINUR SMALL (A) 02/04/2016 1426   KETONESUR NEGATIVE 02/04/2016 1426   PROTEINUR >300 (A) 02/04/2016 1426   NITRITE NEGATIVE 02/04/2016 1426   LEUKOCYTESUR TRACE (A) 02/04/2016 1426   Sepsis Labs: _0 (procalcitonin:4,lacticidven:4)  ) Recent Results (from the past 240 hour(s))  Culture, blood (routine x 2)     Status: Abnormal (Preliminary result)   Collection Time: 04/01/2016  5:00 PM  Result Value Ref Range Status   Specimen Description BLOOD RIGHT HAND  Final   Special Requests BOTTLES DRAWN AEROBIC AND ANAEROBIC  5CC   Final   Culture  Setup Time   Final    GRAM POSITIVE COCCI IN CLUSTERS ANAEROBIC BOTTLE ONLY CRITICAL RESULT CALLED TO, READ BACK BY AND VERIFIED WITH: A. JOHNSTON, PHARM AT [place] ON [id number] BY S. YARBROUGH    Culture (A)  Final    STAPHYLOCOCCUS SPECIES (COAGULASE NEGATIVE) SUSCEPTIBILITIES TO FOLLOW    Report Status PENDING  Incomplete  Culture, blood (routine x 2)     Status: None (Preliminary result)   Collection Time: 03/22/2016  6:32 PM  Result Value Ref Range Status   Specimen Description BLOOD RIGHT ANTECUBITAL  Final   Special Requests IN PEDIATRIC BOTTLE [place]  Final   Culture NO GROWTH 3 DAYS  Final   Report Status PENDING  Incomplete         Radiology Studies: Dg Chest Port 1 View  Result Date: 03/15/2016 CLINICAL DATA:  Acute onset of shortness of breath. Assess pneumonia. Initial encounter. EXAM: PORTABLE CHEST 1 VIEW COMPARISON:  Chest radiograph performed 03/31/2016 FINDINGS: Bibasilar airspace opacities raise concern for pneumonia. Underlying vascular congestion is noted. Small bilateral pleural effusions are seen. No pneumothorax is identified. The cardiomediastinal silhouette is normal in size. No acute osseous abnormalities are identified. Cervical spinal fusion hardware is noted. IMPRESSION: Bibasilar airspace opacities raise concern for pneumonia. Underlying  vascular congestion noted. Small bilateral pleural effusions seen. Electronically Signed   By: Garald Balding M.D.   On: 03/15/2016 05:08        Scheduled Meds: . antiseptic oral rinse  7 mL Mouth Rinse QID  . aspirin  81 mg Oral Daily  . atorvastatin  80 mg Oral q1800  .  bisacodyl  5 mg Oral BID  . chlorhexidine gluconate (SAGE KIT)  15 mL Mouth Rinse BID  . darbepoetin (ARANESP) injection - DIALYSIS  200 mcg Intravenous Q Thu-HD  . feeding supplement (ENSURE ENLIVE)  237 mL Oral BID PC  . feeding supplement (NEPRO CARB STEADY)  237 mL Oral BID BM  . gabapentin  300 mg Oral BID  . insulin aspart  0-9 Units Subcutaneous Q4H  . ipratropium-albuterol  3 mL Nebulization TID  . isosorbide dinitrate  10 mg Oral TID  . metoprolol tartrate  25 mg Oral BID  . multivitamin  1 tablet Oral QHS  . pantoprazole  40 mg Oral Q1200  . polyethylene glycol  17 g Oral Daily  . senna-docusate  1 tablet Oral BID  . vancomycin  500 mg Intravenous Q T,Th,Sa-HD   Continuous Infusions:     LOS: 29 days    Time spent: 40 minutes    Dorota Heinrichs, Geraldo Docker, MD [facility] Pager [phone]   If 7PM-7AM, please contact night-coverage [email or web address] Password [facility] 03/16/2016, 8:13 PM

## 2016-03-16 NOTE — Clinical Social Work Note (Signed)
CSW completed chart review. Current d/c plan appears to be for placement at [facility] when medically stable.  Per MD note- patient is scheduled for a cardiac cath next week; also self de-cannulated on 03/14/16.  SW services will continue to monitor and assist with d/c when stable.  Lorri Frederick. Jaci Lazier, LCSW [phone]  (weekend coverage)

## 2016-03-16 NOTE — Procedures (Signed)
Tolerating HD.  Will keep even today. No hemodynamic issues. Tanner Campbell C

## 2016-03-17 ENCOUNTER — Encounter (HOSPITAL_COMMUNITY): Payer: Self-pay | Admitting: Radiology

## 2016-03-17 ENCOUNTER — Inpatient Hospital Stay (HOSPITAL_COMMUNITY): Payer: Medicare Other

## 2016-03-17 LAB — GLUCOSE, CAPILLARY
GLUCOSE-CAPILLARY: 149 mg/dL — AB (ref 65–99)
GLUCOSE-CAPILLARY: 242 mg/dL — AB (ref 65–99)
Glucose-Capillary: 119 mg/dL — ABNORMAL HIGH (ref 65–99)
Glucose-Capillary: 201 mg/dL — ABNORMAL HIGH (ref 65–99)
Glucose-Capillary: 232 mg/dL — ABNORMAL HIGH (ref 65–99)
Glucose-Capillary: 263 mg/dL — ABNORMAL HIGH (ref 65–99)
Glucose-Capillary: 353 mg/dL — ABNORMAL HIGH (ref 65–99)

## 2016-03-17 NOTE — Progress Notes (Signed)
   03/17/16 1123  RT Progression Team  O2 Device Nasal Cannula  SpO2 100 %  Gauze remains over stoma.

## 2016-03-17 NOTE — Progress Notes (Signed)
PROGRESS NOTE    Tanner Campbell  PIR:[id number] DOB: Apr 12, 1976 DOA: 02/07/2016 PCP: No primary care provider on file.   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  He decompensated 6/23 and required intubation. The patient was found to have H. Influenza positive sputum and treated with Rocephin. CXR demonstrated a large right pleural effusion and he underwent a thoracentesis (1L serous fluid removed - exudative by protein). He had difficulty with weaning and was transferred to [facility] on 6/29 orally intubated for ventilator weaning. The patient developed fever and antibiotics were expanded to vancomycin, cefepime and diflucan. He had progressed to weaning on PSV x 4 hours as of 7/3. He self extubated on 7/5. Since that time he required BiPAP off and on. After HD 7/10 his mental status worsened and he became minimally responsive. CXR was consistent with RLL opacification and he was re-intubated. He was re-cultured and also found to have increased Troponin for which Cardiology was consulted. He was placed on LMWH, but had a GIB while on this w/ his hgb dropping as low as 8.4.  On 7/14 he remained ventilator dependent. His sputum cultures grew MSSA and E coli and he was noted to have + blood cultures. Because of these acutely worsening issues it was felt best to transfer him to the ICU at [facility].   Subjective: 8/13 A/O 4, AFEBRILE OVERNIGHT able to speak: Threatened to leave AMA. Request to attempt to sit in dialysis chair next HD      Assessment & Plan:   Principal Problem:   Acute respiratory failure with hypoxia ([place]) Active Problems:   Sepsis ([place])   ESRD (end stage renal disease) ([place])   Protein-calorie malnutrition, severe   Acute on chronic systolic CHF (congestive heart failure), NYHA class 3 (HCC)  NSTEMI (non-ST elevated myocardial infarction) ([place])   Bacteremia   Tracheostomy status (HCC)   End-stage renal disease on hemodialysis ([place])   Intestinal infection due to veillonella   Pericardial effusion   Anemia, chronic disease   Acute blood loss anemia   Severe malnutrition (HCC)   HCAP (healthcare-associated pneumonia)   Aortic regurgitation   Dysphagia   Chronic back pain   Respiratory failure ([place])   Encounter for feeding tube placement   COPD exacerbation ([place])   Uncontrolled type 2 diabetes mellitus with complication ([place])    Acute hypoxic respiratory failure - s/p trach 7/18 - HCAP - Completed a course of Cefepime  -DuoNeb QID -Patient decannulated, doing well  Tracheostomy -See respiratory failure  Bacteremia with positive Veillonella species (Gram negative rod) -Completed Flagyl x 2 weeks for bacteremia as per ID recs   positive coag negative staph (normally considered contaminant)/Leukocytosis -Given patient's improving leukocytosis and very frail condition. Continue vancomycin until normalization of leukocytosis. Otherwise patient will not be able to have cardiac catheterization: Cardiology has been very reluctant given patient's frail condition to perform catheterization. -Resolving since starting vancomycin.   Bilateral pleural effusions s/p thoracentesis -See acute hypoxic respiratory failure  NSTEMI vs demand ischemia -Cardiology suggest he will need an eventual cardiac cath  -7/27 spoke with Dr. Lyman Bishop cardiology and we agreed would perform cardiac cath early next week. Continue to maximize respiratory status -Strict in and out since admission + 3.9 L -Daily weight [employer]  03/16/16 0500 03/16/16 0830 03/17/16 0441  Weight: 58 kg (127 lb 13.9 oz) 57 kg (125 lb 10.6 oz) 56 kg (123 lb 7.3 oz)  -Manage fluids per HD;See pleural effusion  -Transfuse for hemoglobin<8 Recent Labs Lab 03/14/2016 0311 03/14/16 0900 03/15/16 0921  03/16/16 0757 03/16/16 2302  HGB 11.0* 9.2* 8.7* 9.1* 8.6*  -8/7 [facility] Cleared patient for catheterization -8/13 though debilitated believe able to withstand Cardiac catheterization. Awaiting cardiology if cardiology still believes requires cardiac catheterization. 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, Th, S) -Nephrology following   Recent GIB on LMWH -No evidence of acute blood loss a this time,  Anemia of chronic kidney disease + acute blood loss  No evidence of blood loss, but Hgb drifted down to 7.1  - 7/24 transfuse 1U PRBC  -7/27 transfused 1 unit PRBC  DM type II uncontrolled with complications  -7/32 Hemoglobin A1c= 7.7 -Sensitive SSI  Acute Encephalopathy in setting of critical illness -Resolved  Chronic CVA   Severe malnutrition in context of acute illness -See dysphagia -Ensure TID  Dysphagia -8/3 patient passed swallow test now on dysphagia 3 nectar thick liquid -Hopefully will have a FEES on 8/12 most likely patient still aspirating.  Chronic back pain -Morphine -Neurontin BID -Air mattress, ensure on rotation at all times    Goals of care -PT recommended CIR  - CIR felt he is more appropriate for LTACH - sister does not want him to go back to [facility] because she didn't feel he was being treated well - plan is for SNF placement, but will have to be decannulated as he is ESRD and requires HD and there are no area SNFs that accept ESRD AND trach patients   -Consult psychiatry on 7/29 to ensure patient has competency. Patient's family unrealistic in their expectations therefore would like patient to make decisions if deemed competent -8/13 meeting with patient/sister/brother-in-law explain plan of care for next week 1. Patient thinks he can sit in HCT chair for 4 hours at next session. 2. Cardiac catheterization? 3. Clearance by cardiology to travel to [place] SNF   DVT  prophylaxis: Subcutaneous heparin Code Status: Full Family Communication: Spoke with sister and brother-in-law Disposition Plan: SNF   Consultants:  PCCM Dr. Lyman Bishop Cardiology  [facility] Nephrology  Dr.Su [facility] ENT   Procedures/Significant Events:  7/21 TEE;- No evidence of endocarditis.-Negative vegetation- Aortic insufficiency is seen, with a central jet. It   is likely due to degenerative changes.   7/24 transfuse 1U PRBC 7/27 PCXR: Cardiomegaly with bilateral from interstitial prominence and bilateral effusions C/W CHF, PNA cannot be excluded.- basilar atelectasis  7/27 transfused 1 unit PRBC 8/2 nuclear medicine myocardial perfusion scan:-no ST segment deviation noted during stress. -Defect 1: medium defect of severe severity present in the basal inferior and mid inferior location.C/W  with prior myocardial infarction.-LVEF=(30-44%).,Global hypokinesis worse in the inferior and inferoseptal regions. -Reversible defect in the inferior and inferoseptal region corresponding to wall motion abnormality seen on echo 8/13 CT head W Wo contrast:-No acute infarct- Small chronic infarcts at the right frontal and parietal lobes    Cultures 7/14 MRSA by PCR negative 7/18 blood right hand x2 pending 7/30 blood right hand NGTD 7/30 blood right arm positive coag negative staph(most likely contaminant) 7/30 trach aspirate positive multiple organisms none predominant 8/9 blood right hand positive coag negative staph (normally considered contaminant) 8/9 blood right AC NGTD   Antimicrobials: Cipro  7/12>7/14 Vanc 7/2>7/17 Cefepime 7/14>7/21 Fluconazole 7/7>7/17 Flagyl 7/17 > 8/1 Vancomycin 8/10>>  Devices 7/25 uncuffed #6 [place]>>   LINES / TUBES:  7/29 CorTrak tube placed    Continuous Infusions:         Objective: Vitals:   03/17/16 0750 03/17/16 1123 03/17/16 1200 03/17/16 1204  BP: (!) 146/78  (!) 157/79 (!) 157/79  Pulse: 93  (!) 104 (!) 103  Resp: (!) 30  20  (!) 23  Temp: 98 F (36.7 C)   98.7 F (37.1 C)  TempSrc: Oral   Oral  SpO2: 100% 100% 100% 100%  Weight:      Height:        Intake/Output Summary (Last 24 hours) at 03/17/16 1531 Last data filed at 03/17/16 0900  Gross per 24 hour  Intake              797 ml  Output                1 ml  Net              796 ml   Filed Weights   03/16/16 0500 03/16/16 0830 03/17/16 0441  Weight: 58 kg (127 lb 13.9 oz) 57 kg (125 lb 10.6 oz) 56 kg (123 lb 7.3 oz)    Examination:  General: A/O 4 , Sitting on edge of bed follows commands,Negative acute respiratory distress  Eyes: negative scleral hemorrhage, negative anisocoria, negative icterus ENT: Negative Runny nose, negative gingival bleeding, Neck:  Negative scars, masses, torticollis, lymphadenopathy, JVD Lungs: clear to auscultation bilateral, negative wheezes or crackles Cardiovascular:  Regular rhythm and rate without murmur gallop or rub normal S1 and S2 Abdomen: negative abdominal pain, nondistended, positive soft, bowel sounds, no rebound, no ascites, no appreciable mass Extremities: No significant cyanosis, clubbing, or edema bilateral lower extremities Skin: Negative rashes, lesions, ulcers Psychiatric:  Unable to evaluate  Central nervous system:  Cranial nerves II through XII intact, follows commands moves extremities   .     Data Reviewed: Care during the described time interval was provided by me .  I have reviewed this patient's available data, including medical history, events of note, physical examination, and all test results as part of my evaluation. I have personally reviewed and interpreted all radiology studies.  CBC:  Recent Labs Lab 03/30/2016 0311 03/14/16 0900 03/15/16 0921 03/16/16 0757 03/16/16 2302  WBC 13.0* 18.7* 12.7* 12.9* 11.5*  NEUTROABS  --  16.2* 10.3*  --   --   HGB 11.0* 9.2* 8.7* 9.1* 8.6*  HCT 38.2* 31.3* 30.3* 30.6* 29.0*  MCV 97.0 97.5 98.4 95.9 97.0  PLT 184 212 244 268 784    Basic Metabolic Panel:  Recent Labs Lab 03/25/2016 0424 03/14/16 0900 03/15/16 0921 03/16/16 0757 03/16/16 2302  NA 134* 135 134* 135 135  K 4.4 5.1 3.9 4.1 3.1*  CL 97* 96* 96* 95* 96*  CO2 _0 GLUCOSE 123* 98 111* 143* 96  BUN 19 37* 19 33* 16  CREATININE 3.58* 4.50* 3.07* 3.73* 2.13*  CALCIUM 8.4* 8.7* 8.5* 8.6* 8.0*  MG  --  1.9 1.8  --   --   PHOS 3.2 4.7*  --  3.8  --    GFR: Estimated Creatinine Clearance: 36.2 mL/min (by C-G formula based on SCr of 2.13 mg/dL). Liver Function Tests:  Recent Labs Lab 03/22/2016 0424 03/14/16 0900 03/16/16 0757 03/16/16 2302  AST  --   --   --  30  ALT  --   --   --  19  ALKPHOS  --   --   --  239*  BILITOT  --   --   --  0.7  PROT  --   --   --  5.3*  ALBUMIN 1.9* 2.1* 1.9* 1.8*   No results for input(s): LIPASE, AMYLASE in the last 168 hours. No results for input(s): AMMONIA in the last 168 hours. Coagulation Profile: No results for input(s): INR, PROTIME in the last 168 hours. Cardiac Enzymes: No results for input(s): CKTOTAL, CKMB, CKMBINDEX, TROPONINI in the last 168 hours. BNP (last 3 results) No results for input(s): PROBNP in the last 8760 hours. HbA1C: No results for input(s): HGBA1C in the last 72 hours. CBG:  Recent Labs Lab 03/16/16 2025 03/17/16 0032 03/17/16 0421 03/17/16 0748 03/17/16 1202  GLUCAP 253* 119* 232* 149* 242*   Lipid Profile: No results for input(s): CHOL, HDL, LDLCALC, TRIG, CHOLHDL, LDLDIRECT in the last 72 hours. Thyroid Function Tests: No results for input(s): TSH, T4TOTAL, FREET4, T3FREE, THYROIDAB in the last 72 hours. Anemia Panel: No results for input(s): VITAMINB12, FOLATE, FERRITIN, TIBC, IRON, RETICCTPCT in the last 72 hours. Urine analysis:    Component Value Date/Time   COLORURINE RED (A) 02/04/2016 1426   APPEARANCEUR CLOUDY (A) 02/04/2016 1426   LABSPEC 1.025 02/04/2016 1426   PHURINE 7.5 02/04/2016 1426   GLUCOSEU 500 (A) 02/04/2016 1426   HGBUR SMALL  (A) 02/04/2016 1426   BILIRUBINUR SMALL (A) 02/04/2016 1426   KETONESUR NEGATIVE 02/04/2016 1426   PROTEINUR >300 (A) 02/04/2016 1426   NITRITE NEGATIVE 02/04/2016 1426   LEUKOCYTESUR TRACE (A) 02/04/2016 1426   Sepsis Labs: _0 (procalcitonin:4,lacticidven:4)  ) Recent Results (from the past 240 hour(s))  Culture, blood (routine x 2)     Status: Abnormal (Preliminary result)   Collection Time: 03/15/2016  5:00 PM  Result Value Ref Range Status   Specimen Description BLOOD RIGHT HAND  Final   Special Requests BOTTLES DRAWN AEROBIC AND ANAEROBIC  5CC   Final   Culture  Setup Time   Final    GRAM POSITIVE COCCI IN CLUSTERS ANAEROBIC BOTTLE ONLY CRITICAL RESULT CALLED TO, READ BACK BY AND VERIFIED WITH: A. JOHNSTON, PHARM AT [id number] ON [id number] BY S. YARBROUGH    Culture (A)  Final    STAPHYLOCOCCUS SPECIES (COAGULASE NEGATIVE) Unable to obtain susceptibility results.  Sent to [place] for further susceptibility testing. RESULT CALLED TO, READ BACK BY AND VERIFIED WITH: D MUHORO,RN AT 0906 03/17/16 BY L BENFIELD    Report Status PENDING  Incomplete  Culture, blood (routine x 2)     Status: None (Preliminary result)   Collection Time: 03/22/2016  6:32 PM  Result Value Ref Range Status   Specimen Description BLOOD RIGHT ANTECUBITAL  Final   Special Requests IN PEDIATRIC BOTTLE 1CC  Final   Culture NO GROWTH 4 DAYS  Final   Report Status PENDING  Incomplete         Radiology Studies: Ct Head Wo Contrast  Result Date: 03/17/2016 CLINICAL DATA:  Status post syncope tonight.  Initial encounter. EXAM: CT HEAD WITHOUT CONTRAST TECHNIQUE: Contiguous axial images were obtained from the base of the skull through the vertex without intravenous contrast. COMPARISON:  None. FINDINGS: There is no evidence of acute infarction, mass lesion, or intra- or extra-axial hemorrhage on CT. 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 Mild periventricular white matter change  likely reflects small  vessel ischemic microangiopathy. The brainstem and fourth ventricle are within normal limits. The basal ganglia are unremarkable in appearance. The cerebral hemispheres demonstrate grossly normal gray-white differentiation. No mass effect or midline shift is seen. There is no evidence of fracture; visualized osseous structures are unremarkable in appearance. The orbits are within normal limits. The paranasal sinuses and mastoid air cells are well-aerated. No significant soft tissue abnormalities are seen. IMPRESSION: 1. No acute intracranial pathology seen on CT. 2. 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 3. Mild small vessel ischemic microangiopathy. Electronically Signed   By: Garald Balding M.D.   On: 03/17/2016 00:43   Dg Swallowing Func-speech Pathology  Result Date: 03/17/2016 Objective Swallowing Evaluation: Type of Study: MBS-Modified Barium Swallow Study Patient Details Name: Ayvion Kavanagh MRN: [id number] Date of Birth: 1975-12-09 Today's Date: 03/17/2016 Time: SLP Start Time (ACUTE ONLY): 1047-SLP Stop Time (ACUTE ONLY): 1115 SLP Time Calculation (min) (ACUTE ONLY): 28 min Past Medical History: Past Medical History: Diagnosis Date . COPD (chronic obstructive pulmonary disease) ([place])  . Diabetes mellitus without complication ([place])  . Hemodialysis patient ([place])  . High cholesterol  . Hypertension  . Neuropathy ([place])  . Renal disorder  Past Surgical History: Past Surgical History: Procedure Laterality Date . BACK SURGERY   . CHOLECYSTECTOMY   . NECK SURGERY   . TEE WITHOUT CARDIOVERSION N/A 03/03/2016  Procedure: TRANSESOPHAGEAL ECHOCARDIOGRAM (TEE);  Surgeon: Sanda Klein, MD;  Location: [place];  Service: Cardiovascular;  Laterality: N/A; . TRACHEOSTOMY TUBE PLACEMENT N/A 03/04/2016  Procedure: TRACHEOSTOMY;  Surgeon: Leta Baptist, MD;  Location: [facility] OR;  Service: ENT;  Laterality: N/A; HPI: 40 year old male admitted 02/17/2016 due to recurrent sepsis, failure to  wean, multiple intubations. PMH significant forDM2, HTN, HLD, ESRD on HD, COPD, and ACDF C5-6 2009. Trach placed 02/03/2016.  MBS 8/2 silent aspiration thins and nectar; started on Dys 2 diet with nectar thick liquids with strict use of chin tuck for airway protection.  Self decannulated 03/14/16. Subjective: not feeling well Assessment / Plan / Recommendation CHL IP CLINICAL IMPRESSIONS 03/17/2016 Therapy Diagnosis Moderate pharyngeal phase dysphagia Clinical Impression Pt presents with a persisting moderate pharyngeal dysphagia s/p decannulation due to likely laryngeal trauma after two prolonged oral intubations with self-extubation x2, compounded by significant deconditioning.  Repeated MBS reveals delayed swallow trigger, decreased mobility of hyolaryngeal complex, and decreased laryngeal closure.  These deficits led to post-swallow residue, immediate silent aspiration of thin liquids, and high penetration of nectars.  A chin tuck was no longer beneficial in protecting airway, but limiting rate of eating and bolus size minimized aspiration risk.  Presence of cervical hardware at C5-6 contributed to residue above UES that heightens risk for spillover into larynx. For now, continue dysphagia 2, nectar-thick liquids; crush meds.  Pt tends to be impulsive and eats quickly - he will need cues to eat with caution, slower rate and smaller bolus sizes.    Impact on safety and function Moderate aspiration risk   CHL IP TREATMENT RECOMMENDATION 03/17/2016 Treatment Recommendations Therapy as outlined in treatment plan below   Prognosis 03/17/2016 Prognosis for Safe Diet Advancement Good Barriers to Reach Goals Severity of deficits Barriers/Prognosis Comment -- CHL IP DIET RECOMMENDATION 03/17/2016 SLP Diet Recommendations Dysphagia 2 (Fine chop) solids;Nectar thick liquid Liquid Administration via Cup Medication Administration Crushed with puree Compensations Slow rate;Small sips/bites;Multiple dry swallows after each bite/sip  Postural Changes Seated upright at 90 degrees   CHL IP OTHER RECOMMENDATIONS 03/17/2016 Recommended Consults -- Oral Care  Recommendations Oral care BID Other Recommendations --   CHL IP FOLLOW UP RECOMMENDATIONS 03/21/2016 Follow up Recommendations Skilled Nursing facility   [facility] IP FREQUENCY AND DURATION 03/17/2016 Speech Therapy Frequency (ACUTE ONLY) min 2x/week Treatment Duration 2 weeks      CHL IP ORAL PHASE 03/17/2016 Oral Phase WFL Oral - Pudding Teaspoon -- Oral - Pudding Cup -- Oral - Honey Teaspoon -- Oral - Honey Cup -- Oral - Nectar Teaspoon -- Oral - Nectar Cup -- Oral - Nectar Straw -- Oral - Thin Teaspoon -- Oral - Thin Cup -- Oral - Thin Straw -- Oral - Puree -- Oral - Mech Soft -- Oral - Regular -- Oral - Multi-Consistency -- Oral - Pill -- Oral Phase - Comment --  CHL IP PHARYNGEAL PHASE 03/17/2016 Pharyngeal Phase Impaired Pharyngeal- Pudding Teaspoon -- Pharyngeal -- Pharyngeal- Pudding Cup -- Pharyngeal -- Pharyngeal- Honey Teaspoon NT Pharyngeal -- Pharyngeal- Honey Cup NT Pharyngeal -- Pharyngeal- Nectar Teaspoon NT Pharyngeal -- Pharyngeal- Nectar Cup Delayed swallow initiation-pyriform sinuses;Reduced epiglottic inversion;Reduced anterior laryngeal mobility;Reduced laryngeal elevation;Penetration/Aspiration before swallow;Pharyngeal residue - valleculae;Pharyngeal residue - pyriform Pharyngeal Material enters airway, remains ABOVE vocal cords and not ejected out Pharyngeal- Nectar Straw -- Pharyngeal -- Pharyngeal- Thin Teaspoon NT Pharyngeal -- Pharyngeal- Thin Cup Delayed swallow initiation-pyriform sinuses;Reduced epiglottic inversion;Reduced anterior laryngeal mobility;Reduced laryngeal elevation;Reduced airway/laryngeal closure;Penetration/Aspiration during swallow;Trace aspiration Pharyngeal Material enters airway, passes BELOW cords without attempt by patient to eject out (silent aspiration) Pharyngeal- Thin Straw NT Pharyngeal -- Pharyngeal- Puree Delayed swallow  initiation-vallecula;Reduced epiglottic inversion;Reduced anterior laryngeal mobility;Reduced laryngeal elevation;Reduced tongue base retraction;Pharyngeal residue - valleculae;Pharyngeal residue - pyriform Pharyngeal -- Pharyngeal- Mechanical Soft Delayed swallow initiation-vallecula;Reduced epiglottic inversion;Reduced anterior laryngeal mobility;Reduced laryngeal elevation;Reduced tongue base retraction;Pharyngeal residue - valleculae;Pharyngeal residue - pyriform Pharyngeal -- Pharyngeal- Regular -- Pharyngeal -- Pharyngeal- Multi-consistency -- Pharyngeal -- Pharyngeal- Pill -- Pharyngeal -- Pharyngeal Comment --  CHL IP CERVICAL ESOPHAGEAL PHASE 03/17/2016 Cervical Esophageal Phase (No Data) Pudding Teaspoon -- Pudding Cup -- Honey Teaspoon -- Honey Cup -- Nectar Teaspoon -- Nectar Cup -- Nectar Straw -- Thin Teaspoon -- Thin Cup -- Thin Straw -- Puree -- Mechanical Soft -- Regular -- Multi-consistency -- Pill -- Cervical Esophageal Comment -- No flowsheet data found. Amanda L. Tivis Ringer, [place] CCC/SLP Pager [phone] Juan Quam Laurice 03/17/2016, 11:36 AM                   Scheduled Meds: . antiseptic oral rinse  7 mL Mouth Rinse QID  . aspirin  81 mg Oral Daily  . atorvastatin  80 mg Oral q1800  . bisacodyl  5 mg Oral BID  . chlorhexidine gluconate (SAGE KIT)  15 mL Mouth Rinse BID  . darbepoetin (ARANESP) injection - DIALYSIS  200 mcg Intravenous Q Thu-HD  . feeding supplement (ENSURE ENLIVE)  237 mL Oral BID PC  . feeding supplement (NEPRO CARB STEADY)  237 mL Oral BID BM  . gabapentin  300 mg Oral BID  . insulin aspart  0-9 Units Subcutaneous Q4H  . ipratropium-albuterol  3 mL Nebulization TID  . isosorbide dinitrate  10 mg Oral TID  . metoprolol tartrate  25 mg Oral BID  . multivitamin  1 tablet Oral QHS  . pantoprazole  40 mg Oral Q1200  . polyethylene glycol  17 g Oral Daily  . senna-docusate  1 tablet Oral BID  . vancomycin  500 mg Intravenous Q T,Th,Sa-HD   Continuous  Infusions:     LOS: 30 days    Time  spent: 40 minutes    Daniel Ritthaler, Geraldo Docker, MD [facility] Pager [phone]   If 7PM-7AM, please contact night-coverage [email or web address] Password [facility] 03/17/2016, 3:31 PM

## 2016-03-17 NOTE — Progress Notes (Signed)
Speech Pathology:  MBSS complete. Full report located under chart review in imaging section.  Continue dysphagia 2, nectar liquids.   Byanca Kasper L. Samson Frederic, [place] CCC/SLP Pager [phone]

## 2016-03-17 NOTE — Progress Notes (Signed)
Assessment/Plan:  1. ESRD TTS [place] (and PTA in [facility]). Will cont. TTS in AM 2. Chest pain. 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 Reduced EF 30-44%. Cardiology to do left heart cath when stable  3. GI- No invasive procedure at this time 4. Mild to moderate AI 5. Acute on chronic resp failure - per CCM. Trached & S/p self decannulation  6. Bilateral pleural effusions- s/p thoracentesis T 6/28 1.1 L. BP has limited getting dry enough to help w/pleural effusions (still seen on radiograph 7/30) 7. Bacteremia, staph species - s/p cefipime, and finished 14 days flagyl.  8. Severe malnutrition - per primary service. Eating diet now plus Nepro. 9. Disposition -  [facility] on [address] [place]), indicated they will accept pt with trach as long as it is capped. They will also use hoyer lift prn and he willhave to be able to sit in the chair for his HD treatments.  Subjective: Interval History: c/o pain in AV access area post HD  Objective: Vital signs in last 24 hours: Temp:  [98 F (36.7 C)-100 F (37.8 C)] 98.7 F (37.1 C) (08/13 1204) Pulse Rate:  [89-106] 103 (08/13 1204) Resp:  [14-30] 23 (08/13 1204) BP: (124-165)/(50-79) 157/79 (08/13 1204) SpO2:  [99 %-100 %] 100 % (08/13 1204) Weight:  [56 kg (123 lb 7.3 oz)] 56 kg (123 lb 7.3 oz) (08/13 0441) Weight change: -1 kg (-2 lb 3.3 oz)  Intake/Output from previous day: 08/12 0701 - 08/13 0700 In: 677 [P.O.:677] Out: 2 [Stool:2] Intake/Output this shift: Total I/O In: 360 [P.O.:360] Out: -   General appearance: alert and cooperative Extremities: muscle wasting, AV access LUE with thrill and echymoses  Lab Results:  Recent Labs  03/16/16 0757 03/16/16 2302  WBC 12.9* 11.5*  HGB 9.1* 8.6*  HCT 30.6* 29.0*  PLT 268 256   BMET:  Recent Labs  03/16/16 0757 03/16/16 2302  NA 135 135  K 4.1 3.1*  CL  95* 96*  CO2 26 30  GLUCOSE 143* 96  BUN 33* 16  CREATININE 3.73* 2.13*  CALCIUM 8.6* 8.0*   No results for input(s): PTH in the last 72 hours. Iron Studies: No results for input(s): IRON, TIBC, TRANSFERRIN, FERRITIN in the last 72 hours. Studies/Results: Ct Head Wo Contrast  Result Date: 03/17/2016 CLINICAL DATA:  Status post syncope tonight.  Initial encounter. EXAM: CT HEAD WITHOUT CONTRAST TECHNIQUE: Contiguous axial images were obtained from the base of the skull through the vertex without intravenous contrast. COMPARISON:  None. FINDINGS: There is no evidence of acute infarction, mass lesion, or intra- or extra-axial hemorrhage on CT. 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 Mild periventricular white matter change likely reflects small vessel ischemic microangiopathy. The brainstem and fourth ventricle are within normal limits. The basal ganglia are unremarkable in appearance. The cerebral hemispheres demonstrate grossly normal gray-white differentiation. No mass effect or midline shift is seen. There is no evidence of fracture; visualized osseous structures are unremarkable in appearance. The orbits are within normal limits. The paranasal sinuses and mastoid air cells are well-aerated. No significant soft tissue abnormalities are seen. IMPRESSION: 1. No acute intracranial pathology seen on CT. 2. 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 3. Mild small vessel ischemic microangiopathy. Electronically Signed   By: Garald Balding M.D.   On: 03/17/2016 00:43   Dg Swallowing Func-speech Pathology  Result Date: 03/17/2016 Objective Swallowing Evaluation: Type of Study: MBS-Modified Barium Swallow Study Patient Details Name: Tanner Campbell MRN: [id number] Date of Birth: November 03, 1975 Today's Date: 03/17/2016 Time: SLP Start Time (ACUTE ONLY): 1047-SLP Stop Time (ACUTE ONLY): 1115 SLP Time Calculation (min) (ACUTE ONLY): 28 min Past Medical History:  Past Medical History: Diagnosis Date . COPD (chronic obstructive pulmonary disease) ([place])  . Diabetes mellitus without complication ([place])  . Hemodialysis patient ([place])  . High cholesterol  . Hypertension  . Neuropathy ([place])  . Renal disorder  Past Surgical History: Past Surgical History: Procedure Laterality Date . BACK SURGERY   . CHOLECYSTECTOMY   . NECK SURGERY   . TEE WITHOUT CARDIOVERSION N/A 02/09/2016  Procedure: TRANSESOPHAGEAL ECHOCARDIOGRAM (TEE);  Surgeon: Sanda Klein, MD;  Location: [place];  Service: Cardiovascular;  Laterality: N/A; . TRACHEOSTOMY TUBE PLACEMENT N/A 03/01/2016  Procedure: TRACHEOSTOMY;  Surgeon: Leta Baptist, MD;  Location: [facility] OR;  Service: ENT;  Laterality: N/A; HPI: 40 year old male admitted 02/08/2016 due to recurrent sepsis, failure to wean, multiple intubations. PMH significant forDM2, HTN, HLD, ESRD on HD, COPD, and ACDF C5-6 2009. Trach placed 02/15/2016.  MBS 8/2 silent aspiration thins and nectar; started on Dys 2 diet with nectar thick liquids with strict use of chin tuck for airway protection.  Self decannulated 03/14/16. Subjective: not feeling well Assessment / Plan / Recommendation CHL IP CLINICAL IMPRESSIONS 03/17/2016 Therapy Diagnosis Moderate pharyngeal phase dysphagia Clinical Impression Pt presents with a persisting moderate pharyngeal dysphagia s/p decannulation due to likely laryngeal trauma after two prolonged oral intubations with self-extubation x2, compounded by significant deconditioning.  Repeated MBS reveals delayed swallow trigger, decreased mobility of hyolaryngeal complex, and decreased laryngeal closure.  These deficits led to post-swallow residue, immediate silent aspiration of thin liquids, and high penetration of nectars.  A chin tuck was no longer beneficial in protecting airway, but limiting rate of eating and bolus size minimized aspiration risk.  Presence of cervical hardware at C5-6 contributed to residue above UES that heightens risk for spillover  into larynx. For now, continue dysphagia 2, nectar-thick liquids; crush meds.  Pt tends to be impulsive and eats quickly - he will need cues to eat with caution, slower rate and smaller bolus sizes.    Impact on safety and function Moderate aspiration risk   CHL IP TREATMENT RECOMMENDATION 03/17/2016 Treatment Recommendations Therapy as outlined in treatment plan below   Prognosis 03/17/2016 Prognosis for Safe Diet Advancement Good Barriers to Reach Goals Severity of deficits Barriers/Prognosis Comment -- CHL IP DIET RECOMMENDATION 03/17/2016 SLP Diet Recommendations Dysphagia 2 (Fine chop) solids;Nectar thick liquid Liquid Administration via Cup Medication Administration Crushed with puree Compensations Slow rate;Small sips/bites;Multiple dry swallows after each bite/sip Postural Changes Seated upright at 90 degrees   CHL IP OTHER RECOMMENDATIONS 03/17/2016 Recommended Consults -- Oral Care Recommendations Oral care BID Other Recommendations --   CHL IP FOLLOW UP RECOMMENDATIONS 03/30/2016 Follow up Recommendations Skilled Nursing facility   [facility] IP FREQUENCY AND DURATION 03/17/2016 Speech Therapy Frequency (ACUTE ONLY) min 2x/week Treatment Duration 2 weeks      CHL IP ORAL PHASE 03/17/2016 Oral Phase WFL Oral - Pudding Teaspoon -- Oral - Pudding Cup -- Oral - Honey Teaspoon -- Oral - Honey Cup -- Oral - Nectar Teaspoon -- Oral - Nectar Cup -- Oral - Nectar Straw -- Oral - Thin Teaspoon -- Oral - Thin Cup -- Oral - Thin Straw -- Oral - Puree -- Oral - Mech Soft -- Oral - Regular -- Oral -  Multi-Consistency -- Oral - Pill -- Oral Phase - Comment --  CHL IP PHARYNGEAL PHASE 03/17/2016 Pharyngeal Phase Impaired Pharyngeal- Pudding Teaspoon -- Pharyngeal -- Pharyngeal- Pudding Cup -- Pharyngeal -- Pharyngeal- Honey Teaspoon NT Pharyngeal -- Pharyngeal- Honey Cup NT Pharyngeal -- Pharyngeal- Nectar Teaspoon NT Pharyngeal -- Pharyngeal- Nectar Cup Delayed swallow initiation-pyriform sinuses;Reduced epiglottic inversion;Reduced  anterior laryngeal mobility;Reduced laryngeal elevation;Penetration/Aspiration before swallow;Pharyngeal residue - valleculae;Pharyngeal residue - pyriform Pharyngeal Material enters airway, remains ABOVE vocal cords and not ejected out Pharyngeal- Nectar Straw -- Pharyngeal -- Pharyngeal- Thin Teaspoon NT Pharyngeal -- Pharyngeal- Thin Cup Delayed swallow initiation-pyriform sinuses;Reduced epiglottic inversion;Reduced anterior laryngeal mobility;Reduced laryngeal elevation;Reduced airway/laryngeal closure;Penetration/Aspiration during swallow;Trace aspiration Pharyngeal Material enters airway, passes BELOW cords without attempt by patient to eject out (silent aspiration) Pharyngeal- Thin Straw NT Pharyngeal -- Pharyngeal- Puree Delayed swallow initiation-vallecula;Reduced epiglottic inversion;Reduced anterior laryngeal mobility;Reduced laryngeal elevation;Reduced tongue base retraction;Pharyngeal residue - valleculae;Pharyngeal residue - pyriform Pharyngeal -- Pharyngeal- Mechanical Soft Delayed swallow initiation-vallecula;Reduced epiglottic inversion;Reduced anterior laryngeal mobility;Reduced laryngeal elevation;Reduced tongue base retraction;Pharyngeal residue - valleculae;Pharyngeal residue - pyriform Pharyngeal -- Pharyngeal- Regular -- Pharyngeal -- Pharyngeal- Multi-consistency -- Pharyngeal -- Pharyngeal- Pill -- Pharyngeal -- Pharyngeal Comment --  CHL IP CERVICAL ESOPHAGEAL PHASE 03/17/2016 Cervical Esophageal Phase (No Data) Pudding Teaspoon -- Pudding Cup -- Honey Teaspoon -- Honey Cup -- Nectar Teaspoon -- Nectar Cup -- Nectar Straw -- Thin Teaspoon -- Thin Cup -- Thin Straw -- Puree -- Mechanical Soft -- Regular -- Multi-consistency -- Pill -- Cervical Esophageal Comment -- No flowsheet data found. Amanda L. Tivis Ringer, [place] CCC/SLP Pager [phone] Tanner Campbell 03/17/2016, 11:36 AM               Scheduled: . antiseptic oral rinse  7 mL Mouth Rinse QID  . aspirin  81 mg Oral Daily  .  atorvastatin  80 mg Oral q1800  . bisacodyl  5 mg Oral BID  . chlorhexidine gluconate (SAGE KIT)  15 mL Mouth Rinse BID  . darbepoetin (ARANESP) injection - DIALYSIS  200 mcg Intravenous Q Thu-HD  . feeding supplement (ENSURE ENLIVE)  237 mL Oral BID PC  . feeding supplement (NEPRO CARB STEADY)  237 mL Oral BID BM  . gabapentin  300 mg Oral BID  . insulin aspart  0-9 Units Subcutaneous Q4H  . ipratropium-albuterol  3 mL Nebulization TID  . isosorbide dinitrate  10 mg Oral TID  . metoprolol tartrate  25 mg Oral BID  . multivitamin  1 tablet Oral QHS  . pantoprazole  40 mg Oral Q1200  . polyethylene glycol  17 g Oral Daily  . senna-docusate  1 tablet Oral BID  . vancomycin  500 mg Intravenous Q T,Th,Sa-HD     LOS: 30 days   Chandrea Zellman C 03/17/2016,12:56 PM

## 2016-03-17 NOTE — Progress Notes (Signed)
Pharmacy Antibiotic Note Tanner Campbell is a 40 y.o. male admitted on 03/01/2016 with recurrent sepsis.  Pharmacy has been consulted for vancomycin dosing to rule out bacteremia.  He has ESRD and is on TTS dialysis.   Plan: - Continue Vanc 500mg  IV q-HD TTS - Monitor HD tolerance, micro data, vanc level as indicated - Consider stopping vanc as CoNS is likely a contaminant   Height: 5\' 5"  (165.1 cm) Weight: 123 lb 7.3 oz (56 kg) IBW/kg (Calculated) : 61.5  Temp (24hrs), Avg:99.1 F (37.3 C), Min:98 F (36.7 C), Max:100 F (37.8 C)   Recent Labs Lab 03/08/2016 0424  03/06/2016 0311 03/14/16 0900 03/15/16 0921 03/16/16 0757 03/16/16 2302  WBC 5.3  < > 13.0* 18.7* 12.7* 12.9* 11.5*  CREATININE 3.58*  --   --  4.50* 3.07* 3.73* 2.13*  LATICACIDVEN  --   --   --   --  0.7  --   --   < > = values in this interval not displayed.  Estimated Creatinine Clearance: 36.2 mL/min (by C-G formula based on SCr of 2.13 mg/dL).    Allergies  Allergen Reactions  . Penicillins Other (See Comments)    Reaction: Unknown    Antimicrobials this admission: Vanc 7/2 >> 7/17, resumed 8/10 >> Cefepime 7/14 >> 7/21  Flagyl 7/17 >> 7/31  Dose adjustments this admission: 7/14 0500 VR = 18 mcg/mL 7/14 2000 VR = 21 mcg/mL  Microbiology results: 7/14 MRSA PCR - negative 7/18 BCx x2 - negative 7/30 TA - negative 7/30 BCx x2 - 1 of 2 CoNS (BCID Staph spp) 8/9 BCx x2 - 1 of 2 GPC     Betzaira Mentel D. Laney Potash, PharmD, BCPS Pager:  [phone] 03/17/2016, 8:49 AM

## 2016-03-18 LAB — CULTURE, BLOOD (ROUTINE X 2): CULTURE: NO GROWTH

## 2016-03-18 LAB — GLUCOSE, CAPILLARY
GLUCOSE-CAPILLARY: 179 mg/dL — AB (ref 65–99)
GLUCOSE-CAPILLARY: 188 mg/dL — AB (ref 65–99)
GLUCOSE-CAPILLARY: 95 mg/dL (ref 65–99)
Glucose-Capillary: 129 mg/dL — ABNORMAL HIGH (ref 65–99)
Glucose-Capillary: 131 mg/dL — ABNORMAL HIGH (ref 65–99)
Glucose-Capillary: 166 mg/dL — ABNORMAL HIGH (ref 65–99)

## 2016-03-18 MED ORDER — GUAIFENESIN ER 600 MG PO TB12
600.0000 mg | ORAL_TABLET | Freq: Two times a day (BID) | ORAL | Status: DC
  Administered 2016-03-18 (×2): 600 mg via ORAL
  Filled 2016-03-18 (×2): qty 1

## 2016-03-18 MED ORDER — DIPHENHYDRAMINE HCL 12.5 MG/5ML PO ELIX
12.5000 mg | ORAL_SOLUTION | Freq: Three times a day (TID) | ORAL | Status: DC | PRN
  Administered 2016-03-18: 12.5 mg via ORAL
  Filled 2016-03-18 (×2): qty 10

## 2016-03-18 MED ORDER — PROMETHAZINE HCL 25 MG/ML IJ SOLN
25.0000 mg | Freq: Once | INTRAMUSCULAR | Status: AC
Start: 2016-03-18 — End: 2016-03-18
  Administered 2016-03-18: 25 mg via INTRAVENOUS
  Filled 2016-03-18: qty 1

## 2016-03-18 MED ORDER — METOCLOPRAMIDE HCL 5 MG/ML IJ SOLN
5.0000 mg | Freq: Four times a day (QID) | INTRAMUSCULAR | Status: DC
  Administered 2016-03-18 – 2016-03-22 (×14): 5 mg via INTRAVENOUS
  Filled 2016-03-18 (×14): qty 2

## 2016-03-18 NOTE — Plan of Care (Signed)
Problem: Skin Integrity: Goal: Risk for impaired skin integrity will decrease Outcome: Progressing Patient pressure ulcer on his sacrum is improving and foam pad is changed every shift. Patient is able to tun himself. No other new skin integrity complications.  Problem: Tissue Perfusion: Goal: Risk factors for ineffective tissue perfusion will decrease Outcome: Progressing Explained to patient the need for the use of SCD's. He agreed to wear them so an order for them was placed. They will be placed tonight.  Problem: Fluid Volume: Goal: Ability to maintain a balanced intake and output will improve Outcome: Progressing Patient was very nauseous today so he did not consume many liquids. He understands his fluid restrictions and understands that they have to be thickened. Reinforced his fluid restrictions.

## 2016-03-18 NOTE — Progress Notes (Addendum)
Pt. states continuous pain 10/10 in R temple/face. Morphine and oxy uneffective. Dr paged and prescribed PRN Tylenol, Benadryl and Phenergan.  MD aware of pts ongoing pain. MD suggest a neuro consult if PRN meds do not work  Gaetano Net

## 2016-03-18 NOTE — Consult Note (Addendum)
Date: 03/18/2016               Patient Name:  Tanner Campbell MRN: [id number]  DOB: [birth year] Age / Sex: 40 y.o., male   PCP: No primary care provider on file.         Requesting Physician: Dr. Cherene Altes, MD    Consulting Reason:  Need for Cardiac Cath. Cos, of increased trop. 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  who initially was admitted to [facility] from 6/21 - 6/29 for acute respiratory failure in the setting of volume overload. He decompensated 6/23 and required intubation. The patient was found to have H. Influenza positive sputum and treated with Rocephin. CXR demonstrated a large right pleural effusion and he underwent a thoracentesis (1L serous fluid removed - exudative by protein). He had difficulty with weaning and was transferred to [facility] on 6/29 orally intubated for ventilator weaning. The patient developed fever and antibiotics were expanded to vancomycin, cefepime and diflucan. He had progressed to weaning on PSV x 4 hours as of 7/3. He self extubated on 7/5. Since that time he required BiPAP off and on.After HD 7/10 his mental status worsened and he became minimally responsive. CXR was consistent with RLL opacification and he was re-intubated. He was re-cultured and also found to have increased Troponin for which Cardiology was consulted. He was placed on LMWH, but had a GIB while on this w/ his hgb dropping as low as 8.4. On 7/14 he remained ventilator dependent. His sputum cultures grew MSSA and E coli and he was noted to have + blood cultures. Because of these acutely worsening issues it was felt best to transfer him to the ICU at [facility].  Pt. Was seen in step down unit today, sleeping and hardly responsive to voice stimulus. C/O nausea, vomiting and right temporal pain today. Unable to provide any detail and rest of history was obtained from  previous notes.H/O fall this morning.  Cardiology saw him previously and wants EGD done before making a decision about cardiac Cath.EGD was scheduled for 8/7 but then cancelled? Unable to find any final report. Meds: Current Facility-Administered Medications  Medication Dose Route Frequency Provider Last Rate Last Dose  . acetaminophen (TYLENOL) tablet 650 mg  650 mg Oral Q4H PRN Rhetta Mura Schorr, NP   650 mg at 03/18/16 0407  . albuterol (PROVENTIL) (2.5 MG/3ML) 0.083% nebulizer solution 2.5 mg  2.5 mg Nebulization Q2H PRN Cherene Altes, MD   2.5 mg at 03/14/16 1400  . antiseptic oral rinse solution (CORINZ)  7 mL Mouth Rinse QID Collene Gobble, MD   7 mL at 03/18/16 0450  . aspirin chewable tablet 81 mg  81 mg Oral Daily Cherene Altes, MD   81 mg at 03/18/16 1109  . atorvastatin (LIPITOR) tablet 80 mg  80 mg Oral q1800 Cherene Altes, MD   80 mg at 03/17/16 1648  . bisacodyl (DULCOLAX) EC tablet 5 mg  5 mg Oral BID Allie Bossier, MD   5 mg at 03/18/16 1109  . chlorhexidine gluconate (SAGE KIT) (PERIDEX) 0.12 % solution 15 mL  15 mL Mouth Rinse BID Collene Gobble, MD   15 mL at 03/17/16 2006  . Darbepoetin Alfa (ARANESP) injection 200 mcg  200 mcg Intravenous Q Thu-HD Allie Bossier, MD  200 mcg at 03/14/16 1021  . dextrose 50 % solution 25 mL  25 mL Intravenous PRN Gardiner Barefoot, NP   25 mL at 02/29/16 2050  . diphenhydrAMINE (BENADRYL) 12.5 MG/5ML elixir 12.5-25 mg  12.5-25 mg Oral Q8H PRN Etta Quill, DO   12.5 mg at 03/18/16 0407  . feeding supplement (ENSURE ENLIVE) (ENSURE ENLIVE) liquid 237 mL  237 mL Oral BID PC Allie Bossier, MD   Stopped at 03/17/16 0900  . feeding supplement (NEPRO CARB STEADY) liquid 237 mL  237 mL Oral BID BM Allie Bossier, MD   237 mL at 03/17/16 2126  . gabapentin (NEURONTIN) tablet 300 mg  300 mg Oral BID Cherene Altes, MD   300 mg at 03/18/16 1106  . guaiFENesin (MUCINEX) 12 hr tablet 600 mg  600 mg Oral BID Rondel Jumbo, PA-C    600 mg at 03/18/16 1106  . insulin aspart (novoLOG) injection 0-9 Units  0-9 Units Subcutaneous Q4H Allie Bossier, MD   2 Units at 03/18/16 0900  . ipratropium-albuterol (DUONEB) 0.5-2.5 (3) MG/3ML nebulizer solution 3 mL  3 mL Nebulization TID Allie Bossier, MD   3 mL at 03/18/16 0743  . isosorbide dinitrate (ISORDIL) tablet 10 mg  10 mg Oral TID Cherene Altes, MD   10 mg at 03/18/16 1109  . metoprolol (LOPRESSOR) injection 5 mg  5 mg Intravenous QID PRN Sela Hua, MD      . metoprolol tartrate (LOPRESSOR) tablet 25 mg  25 mg Oral BID Cherene Altes, MD   25 mg at 03/18/16 1107  . morphine 2 MG/ML injection 1-2 mg  1-2 mg Intravenous Q2H PRN Cherene Altes, MD   2 mg at 03/18/16 0210  . multivitamin (RENA-VIT) tablet 1 tablet  1 tablet Oral QHS Collene Gobble, MD   1 tablet at 03/17/16 2114  . nitroGLYCERIN (NITROSTAT) SL tablet 0.4 mg  0.4 mg Sublingual Q5 min PRN Anders Simmonds, MD   0.4 mg at 03/05/16 0657  . ondansetron (ZOFRAN) injection 4 mg  4 mg Intravenous Q6H PRN Allie Bossier, MD   4 mg at 03/18/16 0855  . oxyCODONE (Oxy IR/ROXICODONE) immediate release tablet 5 mg  5 mg Oral Q4H PRN Cherene Altes, MD   5 mg at 03/17/16 2338  . pantoprazole (PROTONIX) EC tablet 40 mg  40 mg Oral Q1200 Cherene Altes, MD   40 mg at 03/17/16 1342  . polyethylene glycol (MIRALAX / GLYCOLAX) packet 17 g  17 g Oral Daily Allie Bossier, MD   17 g at 03/18/16 1106  . senna-docusate (Senokot-S) tablet 1 tablet  1 tablet Oral BID Donita Brooks, NP   1 tablet at 03/18/16 1107  . sennosides (SENOKOT) 8.8 MG/5ML syrup 10 mL  10 mL Oral QHS PRN Rigoberto Noel, MD   10 mL at 02/25/16 2220  . vancomycin (VANCOCIN) 500 mg in sodium chloride 0.9 % 100 mL IVPB  500 mg Intravenous Q T,Th,Sa-HD Allie Bossier, MD        Allergies: Allergies as of 02/19/2016 - Review Complete 02/29/2016  Allergen Reaction Noted  . Penicillins Other (See Comments) 12/22/2015   Past Medical History:  Diagnosis  Date  . COPD (chronic obstructive pulmonary disease) ([place])   . Diabetes mellitus without complication ([place])   . Hemodialysis patient ([place])   . High cholesterol   . Hypertension   . Neuropathy ([place])   .  Renal disorder    Past Surgical History:  Procedure Laterality Date  . BACK SURGERY    . CHOLECYSTECTOMY    . NECK SURGERY    . TEE WITHOUT CARDIOVERSION N/A 02/25/2016   Procedure: TRANSESOPHAGEAL ECHOCARDIOGRAM (TEE);  Surgeon: Sanda Klein, MD;  Location: [place];  Service: Cardiovascular;  Laterality: N/A;  . TRACHEOSTOMY TUBE PLACEMENT N/A 02/14/2016   Procedure: TRACHEOSTOMY;  Surgeon: Leta Baptist, MD;  Location: [facility] OR;  Service: ENT;  Laterality: N/A;   Family History  Problem Relation Age of Onset  . Rheum arthritis Neg Hx   . Osteoarthritis Neg Hx   . Asthma Neg Hx   . Cancer Neg Hx   . Diabetes Neg Hx    Social History   Social History  . Marital status: Single    Spouse name: N/A  . Number of children: N/A  . Years of education: N/A   Occupational History  . disabled    Social History Main Topics  . Smoking status: Current Every Day Smoker  . Smokeless tobacco: Not on file  . Alcohol use No  . Drug use: No  . Sexual activity: Not on file   Other Topics Concern  . Not on file   Social History Narrative  . No narrative on file    Review of Systems: Pertinent items are noted in HPI.  Physical Exam: Blood pressure (!) 160/78, pulse 92, temperature 99.1 F (37.3 C), temperature source Oral, resp. rate 17, height _0  (1.651 m), weight 132 lb 4.4 oz (60 kg), SpO2 99 %.  General: Lean, sick looking guy, no acute distress Chest: Clear bilaterally CVS: RRR, Friction rub  Abdomen: Soft, non tender, non distended, BS +ve Extremities: No significant cyanosis, clubbing, or edema bilateral lower extremities  Lab results: CBC Latest Ref Rng & Units 03/16/2016 03/16/2016 03/15/2016  WBC 4.0 - 10.5 K/uL 11.5(H) 12.9(H) 12.7(H)  Hemoglobin 13.0 - 17.0 g/dL  8.6(L) 9.1(L) 8.7(L)  Hematocrit 39.0 - 52.0 % 29.0(L) 30.6(L) 30.3(L)  Platelets 150 - 400 K/uL 256 268 244   CMP Latest Ref Rng & Units 03/16/2016 03/16/2016 03/15/2016  Glucose 65 - 99 mg/dL 96 143(H) 111(H)  BUN 6 - 20 mg/dL 16 33(H) 19  Creatinine 0.61 - 1.24 mg/dL 2.13(H) 3.73(H) 3.07(H)  Sodium 135 - 145 mmol/L 135 135 134(L)  Potassium 3.5 - 5.1 mmol/L 3.1(L) 4.1 3.9  Chloride 101 - 111 mmol/L 96(L) 95(L) 96(L)  CO2 22 - 32 mmol/L _1 Calcium 8.9 - 10.3 mg/dL 8.0(L) 8.6(L) 8.5(L)  Total Protein 6.5 - 8.1 g/dL 5.3(L) - -  Total Bilirubin 0.3 - 1.2 mg/dL 0.7 - -  Alkaline Phos 38 - 126 U/L 239(H) - -  AST 15 - 41 U/L 30 - -  ALT 17 - 63 U/L 19 - -    Imaging results:  Ct Head Wo Contrast  Result Date: 03/17/2016 CLINICAL DATA:  Status post syncope tonight.  Initial encounter. EXAM: CT HEAD WITHOUT CONTRAST TECHNIQUE: Contiguous axial images were obtained from the base of the skull through the vertex without intravenous contrast. COMPARISON:  None. FINDINGS: There is no evidence of acute infarction, mass lesion, or intra- or extra-axial hemorrhage on CT. 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 Mild periventricular white matter change likely reflects small vessel ischemic microangiopathy. The brainstem and fourth ventricle are within normal limits. The basal ganglia are unremarkable in appearance. The cerebral hemispheres demonstrate grossly normal gray-white differentiation. No mass  effect or midline shift is seen. There is no evidence of fracture; visualized osseous structures are unremarkable in appearance. The orbits are within normal limits. The paranasal sinuses and mastoid air cells are well-aerated. No significant soft tissue abnormalities are seen. IMPRESSION: 1. No acute intracranial pathology seen on CT. 2. 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 3. Mild small vessel ischemic microangiopathy. Electronically Signed    By: Garald Balding M.D.   On: 03/17/2016 00:43   Dg Swallowing Func-speech Pathology  Result Date: 03/17/2016 Objective Swallowing Evaluation: Type of Study: MBS-Modified Barium Swallow Study Patient Details Name: Kalden Wanke MRN: [id number] Date of Birth: 27-Jun-1976 Today's Date: 03/17/2016 Time: SLP Start Time (ACUTE ONLY): 1047-SLP Stop Time (ACUTE ONLY): 1115 SLP Time Calculation (min) (ACUTE ONLY): 28 min Past Medical History: Past Medical History: Diagnosis Date . COPD (chronic obstructive pulmonary disease) ([place])  . Diabetes mellitus without complication ([place])  . Hemodialysis patient ([place])  . High cholesterol  . Hypertension  . Neuropathy ([place])  . Renal disorder  Past Surgical History: Past Surgical History: Procedure Laterality Date . BACK SURGERY   . CHOLECYSTECTOMY   . NECK SURGERY   . TEE WITHOUT CARDIOVERSION N/A 02/05/2016  Procedure: TRANSESOPHAGEAL ECHOCARDIOGRAM (TEE);  Surgeon: Sanda Klein, MD;  Location: [place];  Service: Cardiovascular;  Laterality: N/A; . TRACHEOSTOMY TUBE PLACEMENT N/A 03/02/2016  Procedure: TRACHEOSTOMY;  Surgeon: Leta Baptist, MD;  Location: [facility] OR;  Service: ENT;  Laterality: N/A; HPI: 40 year old male admitted 02/09/2016 due to recurrent sepsis, failure to wean, multiple intubations. PMH significant forDM2, HTN, HLD, ESRD on HD, COPD, and ACDF C5-6 2009. Trach placed 03/02/2016.  MBS 8/2 silent aspiration thins and nectar; started on Dys 2 diet with nectar thick liquids with strict use of chin tuck for airway protection.  Self decannulated 03/14/16. Subjective: not feeling well Assessment / Plan / Recommendation CHL IP CLINICAL IMPRESSIONS 03/17/2016 Therapy Diagnosis Moderate pharyngeal phase dysphagia Clinical Impression Pt presents with a persisting moderate pharyngeal dysphagia s/p decannulation due to likely laryngeal trauma after two prolonged oral intubations with self-extubation x2, compounded by significant deconditioning.  Repeated MBS reveals delayed swallow  trigger, decreased mobility of hyolaryngeal complex, and decreased laryngeal closure.  These deficits led to post-swallow residue, immediate silent aspiration of thin liquids, and high penetration of nectars.  A chin tuck was no longer beneficial in protecting airway, but limiting rate of eating and bolus size minimized aspiration risk.  Presence of cervical hardware at C5-6 contributed to residue above UES that heightens risk for spillover into larynx. For now, continue dysphagia 2, nectar-thick liquids; crush meds.  Pt tends to be impulsive and eats quickly - he will need cues to eat with caution, slower rate and smaller bolus sizes.    Impact on safety and function Moderate aspiration risk   CHL IP TREATMENT RECOMMENDATION 03/17/2016 Treatment Recommendations Therapy as outlined in treatment plan below   Prognosis 03/17/2016 Prognosis for Safe Diet Advancement Good Barriers to Reach Goals Severity of deficits Barriers/Prognosis Comment -- CHL IP DIET RECOMMENDATION 03/17/2016 SLP Diet Recommendations Dysphagia 2 (Fine chop) solids;Nectar thick liquid Liquid Administration via Cup Medication Administration Crushed with puree Compensations Slow rate;Small sips/bites;Multiple dry swallows after each bite/sip Postural Changes Seated upright at 90 degrees   CHL IP OTHER RECOMMENDATIONS 03/17/2016 Recommended Consults -- Oral Care Recommendations Oral care BID Other Recommendations --   CHL IP FOLLOW UP RECOMMENDATIONS 03/28/2016 Follow up Recommendations Skilled Nursing facility   [facility] IP FREQUENCY AND DURATION 03/17/2016  Speech Therapy Frequency (ACUTE ONLY) min 2x/week Treatment Duration 2 weeks      CHL IP ORAL PHASE 03/17/2016 Oral Phase WFL Oral - Pudding Teaspoon -- Oral - Pudding Cup -- Oral - Honey Teaspoon -- Oral - Honey Cup -- Oral - Nectar Teaspoon -- Oral - Nectar Cup -- Oral - Nectar Straw -- Oral - Thin Teaspoon -- Oral - Thin Cup -- Oral - Thin Straw -- Oral - Puree -- Oral - Mech Soft -- Oral - Regular -- Oral  - Multi-Consistency -- Oral - Pill -- Oral Phase - Comment --  CHL IP PHARYNGEAL PHASE 03/17/2016 Pharyngeal Phase Impaired Pharyngeal- Pudding Teaspoon -- Pharyngeal -- Pharyngeal- Pudding Cup -- Pharyngeal -- Pharyngeal- Honey Teaspoon NT Pharyngeal -- Pharyngeal- Honey Cup NT Pharyngeal -- Pharyngeal- Nectar Teaspoon NT Pharyngeal -- Pharyngeal- Nectar Cup Delayed swallow initiation-pyriform sinuses;Reduced epiglottic inversion;Reduced anterior laryngeal mobility;Reduced laryngeal elevation;Penetration/Aspiration before swallow;Pharyngeal residue - valleculae;Pharyngeal residue - pyriform Pharyngeal Material enters airway, remains ABOVE vocal cords and not ejected out Pharyngeal- Nectar Straw -- Pharyngeal -- Pharyngeal- Thin Teaspoon NT Pharyngeal -- Pharyngeal- Thin Cup Delayed swallow initiation-pyriform sinuses;Reduced epiglottic inversion;Reduced anterior laryngeal mobility;Reduced laryngeal elevation;Reduced airway/laryngeal closure;Penetration/Aspiration during swallow;Trace aspiration Pharyngeal Material enters airway, passes BELOW cords without attempt by patient to eject out (silent aspiration) Pharyngeal- Thin Straw NT Pharyngeal -- Pharyngeal- Puree Delayed swallow initiation-vallecula;Reduced epiglottic inversion;Reduced anterior laryngeal mobility;Reduced laryngeal elevation;Reduced tongue base retraction;Pharyngeal residue - valleculae;Pharyngeal residue - pyriform Pharyngeal -- Pharyngeal- Mechanical Soft Delayed swallow initiation-vallecula;Reduced epiglottic inversion;Reduced anterior laryngeal mobility;Reduced laryngeal elevation;Reduced tongue base retraction;Pharyngeal residue - valleculae;Pharyngeal residue - pyriform Pharyngeal -- Pharyngeal- Regular -- Pharyngeal -- Pharyngeal- Multi-consistency -- Pharyngeal -- Pharyngeal- Pill -- Pharyngeal -- Pharyngeal Comment --  CHL IP CERVICAL ESOPHAGEAL PHASE 03/17/2016 Cervical Esophageal Phase (No Data) Pudding Teaspoon -- Pudding Cup -- Honey  Teaspoon -- Honey Cup -- Nectar Teaspoon -- Nectar Cup -- Nectar Straw -- Thin Teaspoon -- Thin Cup -- Thin Straw -- Puree -- Mechanical Soft -- Regular -- Multi-consistency -- Pill -- Cervical Esophageal Comment -- No flowsheet data found. Amanda L. Tivis Ringer, [place] CCC/SLP Pager [phone] Juan Quam Laurice 03/17/2016, 11:36 AM               Assessment, Plan, & Recommendations by Problem:  Mr. 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 Admitted twice with respiratory failure first due to Vol. overload and second due to sepsis.  Trop. Leak/NSTEMI: He has increase in his Trop. On 7/11.He does not have any current c/o chest pain.  Nuclear stress test with reversible defect in the inferior and inferoseptal region corresponding to wall motion abnormality seen on echo. Pt. Was discussed and seen with Dr. Burt Knack and he advised that cath. Is not urgent for him and can be done once he recovered from his acute illness.He can go to [place] for his rehab. -Continue with ASA, statin and lopressor.  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 EF 45-50% by echo and 30-44% by nuclear stress test.  ESRD on HD per nephrology   Acute hypoxic respiratory failure - s/p trach 7/18- HCAP - Completed a course of Cefepime  -DuoNeb QID -Patient decannulated, doing well  Leukocytosis/+ve Coag negative Staph.Bactremia:  -Continue vancomycin until normalization of leukocytosis   Dispo: Disposition is deferred at this time, awaiting improvement of current medical problems. Anticipated discharge in approximately 2-3 day(s).    Signed: Lorella Nimrod, MD 03/18/2016, 11:46 AM  Patient seen, examined. Available data reviewed. Agree with findings, assessment, and plan as outlined by Dr Reesa Chew. The patient is chronically ill-appearing. His appearance is cachectic. Jugular venous pressure is normal. Lung fields are clear. Heart is regular rate  and rhythm with a loud friction rub. Abdomen is soft and nontender. Extremities show muscle wasting and no edema.  Myoview reviewed: Myocardial perfusion is abnormal. Findings consistent with prior myocardial infarction. This is an intermediate risk study. Overall left ventricular systolic function was abnormal. LV cavity size is normal. The left ventricular ejection fraction is moderately decreased (30-44%). There is no prior study for comparison.  This patient has multiple severe medical problems including end-stage renal disease, chronic respiratory failure with previous tracheostomy, and severe protein calorie malnutrition. I do not think he is well enough to be considered a candidate for cardiac catheterization. I also don't think he would be a candidate for revascularization. Would continue medical therapy as tolerated. He has a known pericardial effusion and friction rub on exam, but no clinical evidence of acute pericarditis. Will check an EKG. I suspect he will need extensive rehabilitation. He continues on Isordil, metoprolol, and high-dose atorvastatin. I think medical therapy is most appropriate for presumed CAD.  Sherren Mocha, M.D. 03/18/2016 2:22 PM

## 2016-03-18 NOTE — Progress Notes (Signed)
Patient seen and evaluated on floor due to ongoing headache.  This ongoing for past 2 days he says.  It is severe and in his R temple.  Not alleviated by oxy and morphine.  No neck stiffness, headache does not get worse with neck movement, no L head pain.  1) thankfully patient has already had CT head 24 hours ago which was negative, thus SAH seems highly unlikely. 2) has low grade fever of 99.4, is on vancomycin for non aureus staph in blood.  No meningismus, no true (100.4+) fever.  Meningitis also felt less likely. 3) Will try treating with tylenol, if that fails then try benadryl and phenergan

## 2016-03-18 NOTE — Plan of Care (Signed)
Problem: Activity: Goal: Ability to tolerate increased activity will improve Patient tolerated sitting in chair for 4 hours. He complained of pain in lower back and sacrum area so ordered a pressure redistribution cushion and gave pain medication.   Problem: Coping: Goal: Level of anxiety will decrease Outcome: Progressing Patient anxiety is being managed with use of rest, relaxation, and television.

## 2016-03-18 NOTE — Progress Notes (Signed)
     03/18/16 1030  What Happened  Was fall witnessed? No  Was patient injured? No  Patient found on floor  Found by Staff-comment Vicente Serene, NT)  Stated prior activity other (comment) (patient sleeping- rolled over)  Follow Up  MD notified Dr. Sharon Seller  Time MD notified 1100  Family notified Yes-comment (crystal- sister)  Time family notified 1100  Additional tests No  Progress note created (see row info) Yes    Was the fall witnessed: no  Patient condition before and after the fall: Patient resting in bed prior to fall, "rolled over and fell out" . Patient c/o some right ankle pain, but within an hour of fall pain has completely resolved.   Patient's reaction to the fall: Patient helped back in bed and back to sleep.  Name of the doctor that was notified including date and time: Dr. Sharon Seller, 03/18/16; 1100  Any interventions and vital signs: Patient did not require intervention, VSS. Post fall huddle completed, and patient family notified.   Noe Gens, RN

## 2016-03-18 NOTE — Progress Notes (Signed)
Patient tolerated sitting in chair for four hours. He used a Pressure redistribution cushion to relieve discomfort. Genelle Balameron D Andie Mungin, RN

## 2016-03-18 NOTE — Progress Notes (Signed)
[facility] ROUNDING NOTE   Subjective:   Interval History: resting in bed  S/p fall      refusing to sit in chair on dialysis   Objective:  Vital signs in last 24 hours:  Temp:  [97.3 F (36.3 C)-99.4 F (37.4 C)] 97.3 F (36.3 C) (08/14 0900) Pulse Rate:  [88-104] 100 (08/14 0955) Resp:  [13-31] 17 (08/14 0955) BP: (129-176)/(59-86) 152/78 (08/14 0955) SpO2:  [93 %-100 %] 95 % (08/14 0955) Weight:  [60 kg (132 lb 4.4 oz)] 60 kg (132 lb 4.4 oz) (08/14 0448)  Weight change: 3 kg (6 lb 9.8 oz) Filed Weights   03/16/16 0830 03/17/16 0441 03/18/16 0448  Weight: 57 kg (125 lb 10.6 oz) 56 kg (123 lb 7.3 oz) 60 kg (132 lb 4.4 oz)    Intake/Output: I/O last 3 completed shifts: In: 1577 [P.O.:1577] Out: -    Intake/Output this shift:  Total I/O In: 120 [P.O.:120] Out: 200 [Emesis/NG output:200]  CVS- RRR RS- CTA ABD- BS present soft non-distended EXT- no edemamuscle wasting, AV access LUE with thrill and echymoses   Basic Metabolic Panel:  Recent Labs Lab 03/14/16 0900 03/15/16 0921 03/16/16 0757 03/16/16 2302  NA 135 134* 135 135  K 5.1 3.9 4.1 3.1*  CL 96* 96* 95* 96*  CO2 _0 GLUCOSE 98 111* 143* 96  BUN 37* 19 33* 16  CREATININE 4.50* 3.07* 3.73* 2.13*  CALCIUM 8.7* 8.5* 8.6* 8.0*  MG 1.9 1.8  --   --   PHOS 4.7*  --  3.8  --     Liver Function Tests:  Recent Labs Lab 03/14/16 0900 03/16/16 0757 03/16/16 2302  AST  --   --  30  ALT  --   --  19  ALKPHOS  --   --  239*  BILITOT  --   --  0.7  PROT  --   --  5.3*  ALBUMIN 2.1* 1.9* 1.8*   No results for input(s): LIPASE, AMYLASE in the last 168 hours. No results for input(s): AMMONIA in the last 168 hours.  CBC:  Recent Labs Lab 03/29/2016 0311 03/14/16 0900 03/15/16 0921 03/16/16 0757 03/16/16 2302  WBC 13.0* 18.7* 12.7* 12.9* 11.5*  NEUTROABS  --  16.2* 10.3*  --   --   HGB 11.0* 9.2* 8.7* 9.1* 8.6*  HCT 38.2* 31.3* 30.3* 30.6* 29.0*  MCV 97.0 97.5 98.4 95.9  97.0  PLT 184 212 244 268 256    Cardiac Enzymes: No results for input(s): CKTOTAL, CKMB, CKMBINDEX, TROPONINI in the last 168 hours.  BNP: Invalid input(s): POCBNP  CBG:  Recent Labs Lab 03/17/16 1606 03/17/16 1951 03/17/16 2333 03/18/16 0426 03/18/16 0825  GLUCAP 353* 201* 65* 38* 188*    Microbiology: Results for orders placed or performed during the hospital encounter of 02/12/2016  MRSA PCR Screening     Status: None   Collection Time: 02/14/2016  6:48 PM  Result Value Ref Range Status   MRSA by PCR NEGATIVE NEGATIVE Final    Comment:        The GeneXpert MRSA Assay (FDA approved for NASAL specimens only), is one component of a comprehensive MRSA colonization surveillance program. It is not intended to diagnose MRSA infection nor to guide or monitor treatment for MRSA infections.   Culture, blood (Routine X 2) w Reflex to ID Panel     Status: None   Collection Time: 02/15/2016  9:52 AM  Result Value Ref  Range Status   Specimen Description BLOOD RIGHT HAND  Final   Special Requests IN PEDIATRIC BOTTLE  3CC  Final   Culture NO GROWTH 5 DAYS  Final   Report Status 02/25/2016 FINAL  Final  Culture, blood (Routine X 2) w Reflex to ID Panel     Status: None   Collection Time: 02/08/2016  9:56 AM  Result Value Ref Range Status   Specimen Description BLOOD RIGHT HAND  Final   Special Requests IN PEDIATRIC BOTTLE  2CC  Final   Culture NO GROWTH 5 DAYS  Final   Report Status 02/25/2016 FINAL  Final  Culture, blood (routine x 2)     Status: None   Collection Time: 03/03/16  1:11 PM  Result Value Ref Range Status   Specimen Description BLOOD BLOOD RIGHT HAND  Final   Special Requests IN PEDIATRIC BOTTLE 3CC  Final   Culture NO GROWTH 5 DAYS  Final   Report Status 03/15/2016 FINAL  Final  Culture, blood (routine x 2)     Status: Abnormal   Collection Time: 03/03/16  1:16 PM  Result Value Ref Range Status   Specimen Description BLOOD BLOOD RIGHT ARM  Final   Special  Requests IN PEDIATRIC BOTTLE 3CC  Final   Culture  Setup Time   Final    GRAM POSITIVE COCCI IN CLUSTERS AEROBIC BOTTLE ONLY CRITICAL RESULT CALLED TO, READ BACK BY AND VERIFIED WITH: M TURNER 03/04/16 @ 70 M VESTAL    Culture STAPHYLOCOCCUS SPECIES (COAGULASE NEGATIVE) (A)  Final   Report Status 03/16/2016 FINAL  Final  Blood Culture ID Panel (Reflexed)     Status: Abnormal   Collection Time: 03/03/16  1:16 PM  Result Value Ref Range Status   Enterococcus species NOT DETECTED NOT DETECTED Final   Vancomycin resistance NOT DETECTED NOT DETECTED Final   Listeria monocytogenes NOT DETECTED NOT DETECTED Final   Staphylococcus species DETECTED (A) NOT DETECTED Final    Comment: CRITICAL RESULT CALLED TO, READ BACK BY AND VERIFIED WITH: M TURNER 03/04/16 @ 1143 M VESTAL    Staphylococcus aureus NOT DETECTED NOT DETECTED Final   Methicillin resistance NOT DETECTED NOT DETECTED Final   Streptococcus species NOT DETECTED NOT DETECTED Final   Streptococcus agalactiae NOT DETECTED NOT DETECTED Final   Streptococcus pneumoniae NOT DETECTED NOT DETECTED Final   Streptococcus pyogenes NOT DETECTED NOT DETECTED Final   Acinetobacter baumannii NOT DETECTED NOT DETECTED Final   Enterobacteriaceae species NOT DETECTED NOT DETECTED Final   Enterobacter cloacae complex NOT DETECTED NOT DETECTED Final   Escherichia coli NOT DETECTED NOT DETECTED Final   Klebsiella oxytoca NOT DETECTED NOT DETECTED Final   Klebsiella pneumoniae NOT DETECTED NOT DETECTED Final   Proteus species NOT DETECTED NOT DETECTED Final   Serratia marcescens NOT DETECTED NOT DETECTED Final   Carbapenem resistance NOT DETECTED NOT DETECTED Final   Haemophilus influenzae NOT DETECTED NOT DETECTED Final   Neisseria meningitidis NOT DETECTED NOT DETECTED Final   Pseudomonas aeruginosa NOT DETECTED NOT DETECTED Final   Candida albicans NOT DETECTED NOT DETECTED Final   Candida glabrata NOT DETECTED NOT DETECTED Final   Candida  krusei NOT DETECTED NOT DETECTED Final   Candida parapsilosis NOT DETECTED NOT DETECTED Final   Candida tropicalis NOT DETECTED NOT DETECTED Final  Culture, respiratory (NON-Expectorated)     Status: None   Collection Time: 03/03/16  1:19 PM  Result Value Ref Range Status   Specimen Description TRACHEAL ASPIRATE  Final  Special Requests NONE  Final   Gram Stain   Final    ABUNDANT WBC PRESENT, PREDOMINANTLY PMN RARE SQUAMOUS EPITHELIAL CELLS PRESENT FEW GRAM NEGATIVE COCCI IN PAIRS RARE GRAM NEGATIVE RODS RARE GRAM POSITIVE COCCI IN PAIRS    Culture MULTIPLE ORGANISMS PRESENT, NONE PREDOMINANT  Final   Report Status 03/05/2016 FINAL  Final  Culture, blood (routine x 2)     Status: Abnormal (Preliminary result)   Collection Time: 03/29/2016  5:00 PM  Result Value Ref Range Status   Specimen Description BLOOD RIGHT HAND  Final   Special Requests BOTTLES DRAWN AEROBIC AND ANAEROBIC  5CC   Final   Culture  Setup Time   Final    GRAM POSITIVE COCCI IN CLUSTERS ANAEROBIC BOTTLE ONLY CRITICAL RESULT CALLED TO, READ BACK BY AND VERIFIED WITH: A. JOHNSTON, PHARM AT 1743 ON [id number] BY S. YARBROUGH    Culture (A)  Final    STAPHYLOCOCCUS SPECIES (COAGULASE NEGATIVE) Unable to obtain susceptibility results.  Sent to [facility] for further susceptibility testing. RESULT CALLED TO, READ BACK BY AND VERIFIED WITH: D MUHORO,RN AT 0906 03/17/16 BY L BENFIELD    Report Status PENDING  Incomplete  Culture, blood (routine x 2)     Status: None (Preliminary result)   Collection Time: 04/04/2016  6:32 PM  Result Value Ref Range Status   Specimen Description BLOOD RIGHT ANTECUBITAL  Final   Special Requests IN PEDIATRIC BOTTLE 1CC  Final   Culture NO GROWTH 4 DAYS  Final   Report Status PENDING  Incomplete    Coagulation Studies: No results for input(s): LABPROT, INR in the last 72 hours.  Urinalysis: No results for input(s): COLORURINE, LABSPEC, PHURINE, GLUCOSEU, HGBUR, BILIRUBINUR, KETONESUR,  PROTEINUR, UROBILINOGEN, NITRITE, LEUKOCYTESUR in the last 72 hours.  Invalid input(s): APPERANCEUR    Imaging: Ct Head Wo Contrast  Result Date: 03/17/2016 CLINICAL DATA:  Status post syncope tonight.  Initial encounter. EXAM: CT HEAD WITHOUT CONTRAST TECHNIQUE: Contiguous axial images were obtained from the base of the skull through the vertex without intravenous contrast. COMPARISON:  None. FINDINGS: There is no evidence of acute infarction, mass lesion, or intra- or extra-axial hemorrhage on CT. 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 Mild periventricular white matter change likely reflects small vessel ischemic microangiopathy. The brainstem and fourth ventricle are within normal limits. The basal ganglia are unremarkable in appearance. The cerebral hemispheres demonstrate grossly normal gray-white differentiation. No mass effect or midline shift is seen. There is no evidence of fracture; visualized osseous structures are unremarkable in appearance. The orbits are within normal limits. The paranasal sinuses and mastoid air cells are well-aerated. No significant soft tissue abnormalities are seen. IMPRESSION: 1. No acute intracranial pathology seen on CT. 2. 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 3. Mild small vessel ischemic microangiopathy. Electronically Signed   By: Roanna Raider M.D.   On: 03/17/2016 00:43   Dg Swallowing Func-speech Pathology  Result Date: 03/17/2016 Objective Swallowing Evaluation: Type of Study: MBS-Modified Barium Swallow Study Patient Details Name: Tanner Campbell MRN: [id number] Date of Birth: Aug 05, 1976 Today's Date: 03/17/2016 Time: SLP Start Time (ACUTE ONLY): 1047-SLP Stop Time (ACUTE ONLY): 1115 SLP Time Calculation (min) (ACUTE ONLY): 28 min Past Medical History: Past Medical History: Diagnosis Date . COPD (chronic obstructive pulmonary disease) (HCC)  . Diabetes mellitus without complication (HCC)  .  Hemodialysis patient (HCC)  . High cholesterol  . Hypertension  . Neuropathy (HCC)  . Renal  disorder  Past Surgical History: Past Surgical History: Procedure Laterality Date . BACK SURGERY   . CHOLECYSTECTOMY   . NECK SURGERY   . TEE WITHOUT CARDIOVERSION N/A 02/21/2016  Procedure: TRANSESOPHAGEAL ECHOCARDIOGRAM (TEE);  Surgeon: Sanda Klein, MD;  Location: [place];  Service: Cardiovascular;  Laterality: N/A; . TRACHEOSTOMY TUBE PLACEMENT N/A 02/15/2016  Procedure: TRACHEOSTOMY;  Surgeon: Leta Baptist, MD;  Location: [facility] OR;  Service: ENT;  Laterality: N/A; HPI: 40 year old male admitted 02/13/2016 due to recurrent sepsis, failure to wean, multiple intubations. PMH significant forDM2, HTN, HLD, ESRD on HD, COPD, and ACDF C5-6 2009. Trach placed 03/02/2016.  MBS 8/2 silent aspiration thins and nectar; started on Dys 2 diet with nectar thick liquids with strict use of chin tuck for airway protection.  Self decannulated 03/14/16. Subjective: not feeling well Assessment / Plan / Recommendation CHL IP CLINICAL IMPRESSIONS 03/17/2016 Therapy Diagnosis Moderate pharyngeal phase dysphagia Clinical Impression Pt presents with a persisting moderate pharyngeal dysphagia s/p decannulation due to likely laryngeal trauma after two prolonged oral intubations with self-extubation x2, compounded by significant deconditioning.  Repeated MBS reveals delayed swallow trigger, decreased mobility of hyolaryngeal complex, and decreased laryngeal closure.  These deficits led to post-swallow residue, immediate silent aspiration of thin liquids, and high penetration of nectars.  A chin tuck was no longer beneficial in protecting airway, but limiting rate of eating and bolus size minimized aspiration risk.  Presence of cervical hardware at C5-6 contributed to residue above UES that heightens risk for spillover into larynx. For now, continue dysphagia 2, nectar-thick liquids; crush meds.  Pt tends to be impulsive and eats quickly - he will need cues to  eat with caution, slower rate and smaller bolus sizes.    Impact on safety and function Moderate aspiration risk   CHL IP TREATMENT RECOMMENDATION 03/17/2016 Treatment Recommendations Therapy as outlined in treatment plan below   Prognosis 03/17/2016 Prognosis for Safe Diet Advancement Good Barriers to Reach Goals Severity of deficits Barriers/Prognosis Comment -- CHL IP DIET RECOMMENDATION 03/17/2016 SLP Diet Recommendations Dysphagia 2 (Fine chop) solids;Nectar thick liquid Liquid Administration via Cup Medication Administration Crushed with puree Compensations Slow rate;Small sips/bites;Multiple dry swallows after each bite/sip Postural Changes Seated upright at 90 degrees   CHL IP OTHER RECOMMENDATIONS 03/17/2016 Recommended Consults -- Oral Care Recommendations Oral care BID Other Recommendations --   CHL IP FOLLOW UP RECOMMENDATIONS 03/20/2016 Follow up Recommendations Skilled Nursing facility   [facility] IP FREQUENCY AND DURATION 03/17/2016 Speech Therapy Frequency (ACUTE ONLY) min 2x/week Treatment Duration 2 weeks      CHL IP ORAL PHASE 03/17/2016 Oral Phase WFL Oral - Pudding Teaspoon -- Oral - Pudding Cup -- Oral - Honey Teaspoon -- Oral - Honey Cup -- Oral - Nectar Teaspoon -- Oral - Nectar Cup -- Oral - Nectar Straw -- Oral - Thin Teaspoon -- Oral - Thin Cup -- Oral - Thin Straw -- Oral - Puree -- Oral - Mech Soft -- Oral - Regular -- Oral - Multi-Consistency -- Oral - Pill -- Oral Phase - Comment --  CHL IP PHARYNGEAL PHASE 03/17/2016 Pharyngeal Phase Impaired Pharyngeal- Pudding Teaspoon -- Pharyngeal -- Pharyngeal- Pudding Cup -- Pharyngeal -- Pharyngeal- Honey Teaspoon NT Pharyngeal -- Pharyngeal- Honey Cup NT Pharyngeal -- Pharyngeal- Nectar Teaspoon NT Pharyngeal -- Pharyngeal- Nectar Cup Delayed swallow initiation-pyriform sinuses;Reduced epiglottic inversion;Reduced anterior laryngeal mobility;Reduced laryngeal elevation;Penetration/Aspiration before swallow;Pharyngeal residue - valleculae;Pharyngeal residue -  pyriform Pharyngeal Material enters airway, remains ABOVE vocal cords and not ejected out Pharyngeal- Nectar Straw --  Pharyngeal -- Pharyngeal- Thin Teaspoon NT Pharyngeal -- Pharyngeal- Thin Cup Delayed swallow initiation-pyriform sinuses;Reduced epiglottic inversion;Reduced anterior laryngeal mobility;Reduced laryngeal elevation;Reduced airway/laryngeal closure;Penetration/Aspiration during swallow;Trace aspiration Pharyngeal Material enters airway, passes BELOW cords without attempt by patient to eject out (silent aspiration) Pharyngeal- Thin Straw NT Pharyngeal -- Pharyngeal- Puree Delayed swallow initiation-vallecula;Reduced epiglottic inversion;Reduced anterior laryngeal mobility;Reduced laryngeal elevation;Reduced tongue base retraction;Pharyngeal residue - valleculae;Pharyngeal residue - pyriform Pharyngeal -- Pharyngeal- Mechanical Soft Delayed swallow initiation-vallecula;Reduced epiglottic inversion;Reduced anterior laryngeal mobility;Reduced laryngeal elevation;Reduced tongue base retraction;Pharyngeal residue - valleculae;Pharyngeal residue - pyriform Pharyngeal -- Pharyngeal- Regular -- Pharyngeal -- Pharyngeal- Multi-consistency -- Pharyngeal -- Pharyngeal- Pill -- Pharyngeal -- Pharyngeal Comment --  CHL IP CERVICAL ESOPHAGEAL PHASE 03/17/2016 Cervical Esophageal Phase (No Data) Pudding Teaspoon -- Pudding Cup -- Honey Teaspoon -- Honey Cup -- Nectar Teaspoon -- Nectar Cup -- Nectar Straw -- Thin Teaspoon -- Thin Cup -- Thin Straw -- Puree -- Mechanical Soft -- Regular -- Multi-consistency -- Pill -- Cervical Esophageal Comment -- No flowsheet data found. Amanda L. Tivis Ringer, [place] CCC/SLP Pager [phone] Juan Quam Laurice 03/17/2016, 11:36 AM                Medications:     . antiseptic oral rinse  7 mL Mouth Rinse QID  . aspirin  81 mg Oral Daily  . atorvastatin  80 mg Oral q1800  . bisacodyl  5 mg Oral BID  . chlorhexidine gluconate (SAGE KIT)  15 mL Mouth Rinse BID  . darbepoetin  (ARANESP) injection - DIALYSIS  200 mcg Intravenous Q Thu-HD  . feeding supplement (ENSURE ENLIVE)  237 mL Oral BID PC  . feeding supplement (NEPRO CARB STEADY)  237 mL Oral BID BM  . gabapentin  300 mg Oral BID  . guaiFENesin  600 mg Oral BID  . insulin aspart  0-9 Units Subcutaneous Q4H  . ipratropium-albuterol  3 mL Nebulization TID  . isosorbide dinitrate  10 mg Oral TID  . metoprolol tartrate  25 mg Oral BID  . multivitamin  1 tablet Oral QHS  . pantoprazole  40 mg Oral Q1200  . polyethylene glycol  17 g Oral Daily  . senna-docusate  1 tablet Oral BID  . vancomycin  500 mg Intravenous Q T,Th,Sa-HD   acetaminophen, albuterol, dextrose, diphenhydrAMINE, metoprolol, morphine injection, nitroGLYCERIN, ondansetron (ZOFRAN) IV, oxyCODONE, sennosides  Assessment/ Plan:   1. ESRD   TTS dialysis [facility]) 2. 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 GI  Stable still with projectile vomiting 4, Anemia stable 5. Acute on chronic respiratory failure -- Per CCM 6. Bilateral pleural effusion 7. Bacteremia staph s/p cefipime 8. Severe malnutrition 9. Disposition  -- return to [facility] unit     LOS: 12 Rebeccah Ivins W _0 _1 :56 AM

## 2016-03-18 NOTE — Progress Notes (Signed)
Called patient's sister to inform her that the patient had fallen out of bed but was not injured. She voiced understanding. She was confused about how he had put his side rail down. Further informed her that he had 3 rails up due to the fact we are not allowed to put up all 4 rails as it is considered a restraint. Per sister, she requests that the patient have all four side rails up while he is sleeping as he is a restless sleeper. Side rail raised and bed alarm reset.   Noe Gens, RN

## 2016-03-18 NOTE — Progress Notes (Signed)
PT Cancellation Note  Patient Details Name: Tanner Campbell MRN: [id number] DOB: 01-17-1976   Cancelled Treatment:    Reason Eval/Treat Not Completed: Medical issues which prohibited therapy (Per RN pt just with +emesis).  Will continue to follow acutely and will attempt to return later today, schedule permitting.   Encarnacion Chu PT, DPT  Pager: [phone] Phone: [phone] 03/18/2016, 8:56 AM

## 2016-03-18 NOTE — Progress Notes (Signed)
[place] TEAM 1 - Stepdown/ICU TEAM  Shaheim Mahar  LYY:[id number] DOB: 07/10/1976 DOA: 02/11/2016 PCP: No primary care provider on file.    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  He decompensated 6/23 and required intubation. The patient was found to have H. Influenza positive sputum and treated with Rocephin. CXR demonstrated a large right pleural effusion and he underwent a thoracentesis (1L serous fluid removed - exudative by protein). He had difficulty with weaning and was transferred to [facility] on 6/29 orally intubated for ventilator weaning. The patient developed fever and antibiotics were expanded to vancomycin, cefepime and diflucan. He had progressed to weaning on PSV x 4 hours as of 7/3. He self extubated on 7/5 but required BiPAP off and on thereafter. After HD 7/10his mental status worsened and he became minimally responsive. CXR was consistent with RLL opacification and he was re-intubated. He was re-cultured and also found to have increased Troponin for which Cardiology was consulted. He was placed on LMWH, but had a GIB while on this w/ his hgb dropping as low as 8.4.  On 7/14 he remained ventilator dependent. His sputum cultures grew MSSA and E coli and he was noted to have + blood cultures. Because of these acutely worsening issues it was felt best to transfer him to the ICU at [facility].   Significant Events: 6/21 admitted at [facility] 6/29 transfer to [facility] 7/14 transfer from [facility] to [facility] 7/18 trach placed 8/9 patient pulled out his own trach  Subjective: The patient is complaining of unrelenting nausea with some vomiting earlier today.  He denies hematemesis.  He denies chest pain shortness of breath or abdominal pain.  He states his appetite is poor.  He is presently sitting up in a chair and tolerating  this well.  Assessment & Plan:  Acute hypoxic respiratory failure - s/p trach 7/18 - HCAP trach out per pt 8/9 - PCCM had been performing capping trial - no evidence of distress   Coag negative Staph species in blood cx 7/30 and again 8/9 Blood cx obtained 7/30 1 of 2 + - repeat cx on 8/9 again 1 of 2 +, raising concern this represented a true bacteremia - cont empiric abx - the pt has already had a TEE this admit, though it was before these blood cx were obtained - at present he is not stable enough from a resp standpoint to allow for repeat TEE - will have to consider a 4 week empiric course of abx  Bacteremia with Veillonella species (Gram negative rod) completed Flagyl x 2 weeks for bacteremia as per ID recs - repeat blood cx today  Bilateral pleural effusions s/p thoracentesis Stable on follow-up chest x-ray 8/8 - no respiratory distress w/ stable oxygenation  NSTEMI vs demand ischemia nuclear medicine stress test noted basal inferior and mid inferior perfusion defecst - Cards has come to the conclusion of the patient is not an appropriate candidate for cardiac cath and is recommending ongoing medical therapy  Small pericardial effusion with friction rub - resolved  ANA negative - TEE without evidence of endocarditis/vegetations - hemodynamically stable   Mild to moderate aortic regurgitation Hemodynamically stable   ESRD on HD Tue/Th/Sat Nephrology following   Reported recent GIB on LMWH No evidence of acute blood loss a this time -  this occurred while the pt was at [facility] when lovenox was begun during an episode of CP - GI did not feel EGD was warranted  Anemia of chronic kidney disease + acute blood loss  No evidence of blood loss presently - reports of acute blood loss date back to [facility] when pt was placed on lovenox during episode of CP (no records available in Epic) - Hgb presently climbing  Recent Labs Lab 03/21/2016 0311 03/14/16 0900 03/15/16 0921 03/16/16 0757  03/16/16 2302  HGB 11.0* 9.2* 8.7* 9.1* 8.6*    DM type II w/ hyperglycemia  CBG variable - follow without change in treatment plan today  Acute Encephalopathy in setting of critical illness Resolved   Severe malnutrition in context of acute illness Patient is much more alert and has now been cleared for diet   Dysphagia Cleared for modified diet by SLP  Disposition Planning  Pending - difficult to place due to trach AND ESRD on HD - pt now has capacity to make his own decisions regarding placement post hospital - placement should be much easier if he thrives w/o his trach   DVT prophylaxis: SQ heparin  Code Status: FULL CODE Family Communication: no family present at time of exam  Disposition Plan: goal is for SNF rehab stay   Consultants:  PCCM Cardiology  ID Nephrology  ENT  Antimicrobials:  Cipro 7/12>7/14 Vanc 7/2>7/17 Vanc 8/10 > Cefepime 7/14>7/21 Fluconazole 7/7>7/17 Flagyl 7/17 > 7/31  Objective: Blood pressure 117/60, pulse 88, temperature 99.1 F (37.3 C), temperature source Oral, resp. rate 19, height _0  (1.651 m), weight 60 kg (132 lb 4.4 oz), SpO2 98 %.  Intake/Output Summary (Last 24 hours) at 03/18/16 1548 Last data filed at 03/18/16 1130  Gross per 24 hour  Intake              640 ml  Output              200 ml  Net              440 ml   Filed Weights   03/16/16 0830 03/17/16 0441 03/18/16 0448  Weight: 57 kg (125 lb 10.6 oz) 56 kg (123 lb 7.3 oz) 60 kg (132 lb 4.4 oz)    Examination: Gen:  Alert and conversant - sitting up in chair Lungs:  Good air movement throughout all fields with no focal crackles and no wheeze CV: Regular rate and rhythm without gallop or appreciable murmur Abdom:  Thin, NT/ND, soft, bs+, no rebound Ext:  No signif edema - cachectic  CBC:  Recent Labs Lab 04/02/2016 0311 03/14/16 0900 03/15/16 0921 03/16/16 0757 03/16/16 2302  WBC 13.0* 18.7* 12.7* 12.9* 11.5*  NEUTROABS  --  16.2* 10.3*  --   --   HGB  11.0* 9.2* 8.7* 9.1* 8.6*  HCT 38.2* 31.3* 30.3* 30.6* 29.0*  MCV 97.0 97.5 98.4 95.9 97.0  PLT 184 212 244 268 740   Basic Metabolic Panel:  Recent Labs Lab 03/14/16 0900 03/15/16 0921 03/16/16 0757 03/16/16 2302  NA 135 134* 135 135  K 5.1 3.9 4.1 3.1*  CL 96* 96* 95* 96*  CO2 _1 GLUCOSE 98 111* 143* 96  BUN 37* 19 33* 16  CREATININE 4.50* 3.07* 3.73* 2.13*  CALCIUM 8.7* 8.5* 8.6* 8.0*  MG 1.9 1.8  --   --   PHOS 4.7*  --  3.8  --      GFR: Estimated Creatinine Clearance: 38.7  mL/min (by C-G formula based on SCr of 2.13 mg/dL).  Liver Function Tests:  Recent Labs Lab 03/14/16 0900 03/16/16 0757 03/16/16 2302  AST  --   --  30  ALT  --   --  19  ALKPHOS  --   --  239*  BILITOT  --   --  0.7  PROT  --   --  5.3*  ALBUMIN 2.1* 1.9* 1.8*    HbA1C: Hgb A1c MFr Bld  Date/Time Value Ref Range Status  02/02/2016 06:50 AM 7.7 (H) 4.8 - 5.6 % Final    Comment:    (NOTE)         Pre-diabetes: 5.7 - 6.4         Diabetes: >6.4         Glycemic control for adults with diabetes: <7.0   01/25/2016 04:32 AM 8.2 (H) 4.0 - 6.0 % Final    CBG:  Recent Labs Lab 03/17/16 1951 03/17/16 2333 03/18/16 0426 03/18/16 0825 03/18/16 1136  GLUCAP 201* 263* 179* 188* 129*     Scheduled Meds: . antiseptic oral rinse  7 mL Mouth Rinse QID  . aspirin  81 mg Oral Daily  . atorvastatin  80 mg Oral q1800  . bisacodyl  5 mg Oral BID  . chlorhexidine gluconate (SAGE KIT)  15 mL Mouth Rinse BID  . darbepoetin (ARANESP) injection - DIALYSIS  200 mcg Intravenous Q Thu-HD  . feeding supplement (ENSURE ENLIVE)  237 mL Oral BID PC  . feeding supplement (NEPRO CARB STEADY)  237 mL Oral BID BM  . gabapentin  300 mg Oral BID  . guaiFENesin  600 mg Oral BID  . insulin aspart  0-9 Units Subcutaneous Q4H  . ipratropium-albuterol  3 mL Nebulization TID  . isosorbide dinitrate  10 mg Oral TID  . metoprolol tartrate  25 mg Oral BID  . multivitamin  1 tablet Oral QHS  .  pantoprazole  40 mg Oral Q1200  . polyethylene glycol  17 g Oral Daily  . senna-docusate  1 tablet Oral BID  . vancomycin  500 mg Intravenous Q T,Th,Sa-HD     LOS: 31 days    Cherene Altes, MD [facility] Office  [phone] Pager - Text Page per Amion as per below:  On-Call/Text Page:      [email or web address]      password [email or web address]  If 7PM-7AM, please contact night-coverage [email or web address] Password [facility] 03/18/2016, 3:48 PM

## 2016-03-18 NOTE — Progress Notes (Signed)
Physical Therapy Treatment Patient Details Name: Tanner AdieJerome Profit MRN: [id number] DOB: Jun 07, 1976 Today's Date: 03/18/2016    History of Present Illness Tanner Campbell is an 40 y.o. male with PMH of DM II, HTN, HLD, ESRD on HD (T, Th, S), COPD and prior back surgery who initially was admitted to [facility] from 6/21 - 6/29 for acute respiratory failure in the setting of volume overload. He was intubated 6/23-7/5 (self extubated), 7/10-7/16 (self extubated), was then reintubated. Initially, sputum was positive for H influenza and subsequently for MSSA and Escherichia coli and blood cultures growing a gram-negative anaerobic organism. Thoracentesis on right showed borderline exudate by LDH criteria. 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  Fell OOB while sleeping 03/18/16.    PT Comments    Kevin FentonJerome was motivated today and was agreeable to therapy.  He requires mod assist to boost up to standing using Stedy.  Once transferred to chair focused exercises on strengthening of quads with eccentric lowering.  Pt will benefit from continued skilled PT services to increase functional independence and safety.   Follow Up Recommendations  Supervision/Assistance - 24 hour;SNF     Equipment Recommendations  Other (comment) (TBA)    Recommendations for Other Services       Precautions / Restrictions Precautions Precautions: Fall Precaution Comments: Dys 2 with nectar thick liquids Restrictions Weight Bearing Restrictions: No    Mobility  Bed Mobility Overal bed mobility: Needs Assistance Bed Mobility: Supine to Sit     Supine to sit: Mod assist     General bed mobility comments: Mod assist to elevate trunk as pt uses bed rail  Transfers Overall transfer level: Needs assistance Equipment used: Ambulation equipment used Transfers: Sit to/from Stand Sit to Stand: Mod assist;+2 physical assistance;+2 safety/equipment          General transfer comment: Pt requires mod A +2 to lift buttocks from bed and assist and increased time to extend hips and knees.  Stood from bed x1 and from [employer] x1.  Began feeling nauseous after standing from [employer] and requested to sit.  Ambulation/Gait                 Stairs            Wheelchair Mobility    Modified Rankin (Stroke Patients Only)       Balance Overall balance assessment: Needs assistance Sitting-balance support: No upper extremity supported;Feet supported Sitting balance-Leahy Scale: Fair     Standing balance support: Bilateral upper extremity supported;During functional activity Standing balance-Leahy Scale: Poor                      Cognition Arousal/Alertness: Awake/alert Behavior During Therapy: Flat affect Overall Cognitive Status: Impaired/Different from baseline Area of Impairment: Problem solving             Problem Solving: Slow processing;Difficulty sequencing;Requires verbal cues      Exercises General Exercises - Lower Extremity Long Arc Quad: Strengthening;Both;Seated;5 reps Other Exercises Other Exercises: Reaching x10 each UE same side and cross body with opposite UE resting in lap.  Pt fatigues quickly. Other Exercises: Eccentric lowering from knee extension Bil LEs x10 seated in chair    General Comments General comments (skin integrity, edema, etc.): Pt encouraged to sit in chair to improve lung capacity, understanding and agreeable.  Contacted RN who placed order for pressure distribution chair pad.  Informed RN that pt should limit time sitting  on pillow to ~1.5 hrs, hopeful that the cushion will be delivered in this time frame.      Pertinent Vitals/Pain Pain Assessment: No/denies pain Pain Intervention(s): Monitored during session    Home Living                      Prior Function            PT Goals (current goals can now be found in the care plan section) Acute Rehab PT  Goals Patient Stated Goal: I want to get to the chair and eat PT Goal Formulation: With patient Time For Goal Achievement: 03/28/16 Potential to Achieve Goals: Good Progress towards PT goals: Progressing toward goals    Frequency  Min 3X/week    PT Plan Current plan remains appropriate    Co-evaluation             End of Session Equipment Utilized During Treatment: Oxygen;Gait belt Activity Tolerance: Patient tolerated treatment well;Patient limited by fatigue Patient left: with call bell/phone within reach;in chair;with chair alarm set;Other (comment) (sitting on pillow)     Time: 1610-9604 PT Time Calculation (min) (ACUTE ONLY): 35 min  Charges:  $Therapeutic Exercise: 8-22 mins $Therapeutic Activity: 8-22 mins                    G Codes:       Encarnacion Chu PT, DPT  Pager: [phone] Phone: [phone] 03/18/2016, 3:42 PM

## 2016-03-18 NOTE — Care Management Important Message (Signed)
Important Message  Patient Details  Name: Tanner Campbell MRN: [id number] Date of Birth: [birth year]   Medicare Important Message Given:  Yes    [name] 03/18/2016, 10:38 AM

## 2016-03-19 LAB — CBC
HEMATOCRIT: 31.2 % — AB (ref 39.0–52.0)
HEMOGLOBIN: 9 g/dL — AB (ref 13.0–17.0)
MCH: 28 pg (ref 26.0–34.0)
MCHC: 28.8 g/dL — ABNORMAL LOW (ref 30.0–36.0)
MCV: 96.9 fL (ref 78.0–100.0)
PLATELETS: 302 10*3/uL (ref 150–400)
RBC: 3.22 MIL/uL — AB (ref 4.22–5.81)
RDW: 18.6 % — ABNORMAL HIGH (ref 11.5–15.5)
WBC: 16 10*3/uL — AB (ref 4.0–10.5)

## 2016-03-19 LAB — GLUCOSE, CAPILLARY
GLUCOSE-CAPILLARY: 124 mg/dL — AB (ref 65–99)
GLUCOSE-CAPILLARY: 255 mg/dL — AB (ref 65–99)
GLUCOSE-CAPILLARY: 282 mg/dL — AB (ref 65–99)
GLUCOSE-CAPILLARY: 306 mg/dL — AB (ref 65–99)
Glucose-Capillary: 143 mg/dL — ABNORMAL HIGH (ref 65–99)

## 2016-03-19 LAB — COMPREHENSIVE METABOLIC PANEL
ALT: 20 U/L (ref 17–63)
ANION GAP: 11 (ref 5–15)
AST: 30 U/L (ref 15–41)
Albumin: 1.8 g/dL — ABNORMAL LOW (ref 3.5–5.0)
Alkaline Phosphatase: 231 U/L — ABNORMAL HIGH (ref 38–126)
BUN: 50 mg/dL — ABNORMAL HIGH (ref 6–20)
CHLORIDE: 94 mmol/L — AB (ref 101–111)
CO2: 28 mmol/L (ref 22–32)
Calcium: 8.4 mg/dL — ABNORMAL LOW (ref 8.9–10.3)
Creatinine, Ser: 4.52 mg/dL — ABNORMAL HIGH (ref 0.61–1.24)
GFR, EST AFRICAN AMERICAN: 17 mL/min — AB (ref >60)
GFR, EST NON AFRICAN AMERICAN: 15 mL/min — AB (ref >60)
Glucose, Bld: 107 mg/dL — ABNORMAL HIGH (ref 65–99)
POTASSIUM: 5.1 mmol/L (ref 3.5–5.1)
SODIUM: 133 mmol/L — AB (ref 135–145)
Total Bilirubin: 1 mg/dL (ref 0.3–1.2)
Total Protein: 5.8 g/dL — ABNORMAL LOW (ref 6.5–8.1)

## 2016-03-19 MED ORDER — VANCOMYCIN HCL IN DEXTROSE 500-5 MG/100ML-% IV SOLN
INTRAVENOUS | Status: AC
  Administered 2016-03-19: 500 mg
  Filled 2016-03-19: qty 100

## 2016-03-19 MED ORDER — LIDOCAINE HCL (PF) 1 % IJ SOLN: 5.0000 mL | INTRAMUSCULAR | Status: DC | PRN

## 2016-03-19 MED ORDER — ALTEPLASE 2 MG IJ SOLR: 2.0000 mg | Freq: Once | INTRAMUSCULAR | Status: DC | PRN

## 2016-03-19 MED ORDER — SODIUM CHLORIDE 0.9 % IV SOLN: 100.0000 mL | INTRAVENOUS | Status: DC | PRN

## 2016-03-19 MED ORDER — HEPARIN SODIUM (PORCINE) 1000 UNIT/ML DIALYSIS: 1000.0000 [IU] | INTRAMUSCULAR | Status: DC | PRN

## 2016-03-19 MED ORDER — LIDOCAINE-PRILOCAINE 2.5-2.5 % EX CREA: 1.0000 "application " | TOPICAL_CREAM | CUTANEOUS | Status: DC | PRN

## 2016-03-19 MED ORDER — PENTAFLUOROPROP-TETRAFLUOROETH EX AERO: 1.0000 "application " | INHALATION_SPRAY | CUTANEOUS | Status: DC | PRN

## 2016-03-19 MED ORDER — PROMETHAZINE HCL 25 MG/ML IJ SOLN
12.5000 mg | Freq: Once | INTRAMUSCULAR | Status: AC
  Administered 2016-03-19: 12.5 mg via INTRAVENOUS
  Filled 2016-03-19: qty 1

## 2016-03-19 NOTE — Procedures (Signed)
I have seen and examined this patient and agree with the plan of care. Seen on dialysis  BP 150/86  No edema AVF left  Goal 2000 Hb   Tarah Buboltz W 03/19/2016, 11:17 AM

## 2016-03-19 NOTE — Progress Notes (Signed)
    Subjective:  No chest pain or dyspnea. Had dialysis this morning.   Objective:  Vital Signs in the last 24 hours: Temp:  [97.3 F (36.3 C)-100.3 F (37.9 C)] 100.3 F (37.9 C) (08/15 0438) Pulse Rate:  [80-105] 93 (08/15 0200) Resp:  [15-28] 21 (08/15 0600) BP: (106-176)/(58-86) 123/63 (08/15 0600) SpO2:  [93 %-100 %] 98 % (08/15 0722) Weight:  [59 kg (130 lb 1.1 oz)] 59 kg (130 lb 1.1 oz) (08/15 0500)  Intake/Output from previous day: 08/14 0701 - 08/15 0700 In: 700 [P.O.:700] Out: 200 [Emesis/NG output:200]  Physical Exam: Pt is alert and oriented, chronically ill-appearing in NAD HEENT: normal Neck: JVP - normal Lungs: CTA bilaterally CV: RRR with friction rub, decreased from yesterday's exam Abd: soft, NT, Positive BS, no hepatomegaly Ext: no C/C/E Skin: warm/dry no rash   Lab Results:  Recent Labs  03/16/16 2302 03/19/16 0357  WBC 11.5* 16.0*  HGB 8.6* 9.0*  PLT 256 302    Recent Labs  03/16/16 2302 03/19/16 0357  NA 135 133*  K 3.1* 5.1  CL 96* 94*  CO2 30 28  GLUCOSE 96 107*  BUN 16 50*  CREATININE 2.13* 4.52*   No results for input(s): TROPONINI in the last 72 hours.  Invalid input(s): CK, MB  Cardiac Studies: EKG: NSR, LVH with repolarization abnormality, age-indeterminate inferior MI  Tele: Sinus rhythm, sinus tachycardia  Assessment/Plan:  1. Elevated troponin - demand ischemia most likely etiology as there is minimal troponin elevation with flat trend in this patient who has been critically ill and has ESRD. 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 No chest pain.  2. Acute on chronic systolic heart failure: in context of ESRD and multiple medical problems. Volume removal as tolerated with HD  As per yesterday's note, I do not think further invasive cardiac evaluation will impact this patient's outcome in a positive way. Favor ongoing medical management of presumed CAD. From my perspective, there is no cardiac  contraindication for him to transfer out to a rehab facility. However, considering the complexity of his medical problems, may be best to be done locally.  Tonny Bollmanooper, Arjen Deringer, M.D. 03/19/2016, 7:41 AM

## 2016-03-19 NOTE — Progress Notes (Signed)
Occupational Therapy Treatment Patient Details Name: Tanner Campbell MRN: [id number] DOB: 01-26-1976 Today's Date: 03/19/2016    History of present illness Tanner Campbell is an 40 y.o. male with PMH of DM II, HTN, HLD, ESRD on HD (T, Th, S), COPD and prior back surgery who initially was admitted to [facility] from 6/21 - 6/29 for acute respiratory failure in the setting of volume overload. He was intubated 6/23-7/5 (self extubated), 7/10-7/16 (self extubated), was then reintubated. Initially, sputum was positive for H influenza and subsequently for MSSA and Escherichia coli and blood cultures growing a gram-negative anaerobic organism. Thoracentesis on right showed borderline exudate by LDH criteria. Tanner Campbell 7/18; passed swallow study 8/2; cardiolite stress test 8/2 with global hypokinesis EF 40% and prior infarct.  Fell OOB while sleeping 03/18/16.   OT comments  Pt fatigued this pm.  He attempted to move into standing with max a and use of stedy, but unable.  Performed UE exercise.   Follow Up Recommendations  CIR;Supervision/Assistance - 24 hour    Equipment Recommendations  None recommended by OT    Recommendations for Other Services      Precautions / Restrictions Precautions Precautions: Fall Precaution Comments: Dys 2 with nectar thick liquids       Mobility Bed Mobility Overal bed mobility: Needs Assistance Bed Mobility: Sit to Supine;Rolling;Sidelying to Sit Rolling: Supervision Sidelying to sit: Mod assist   Sit to supine: Min assist   General bed mobility comments: assist to lift shoulders from bed and assist to lift LEs  Transfers Overall transfer level: Needs assistance Equipment used: Ambulation equipment used Transfers: Sit to/from Stand           General transfer comment: Attempted sit to stand x 2 with stedy and max A, but pt unable to achieve.  He was fatigued afterwards and returned to supine     Balance Overall balance assessment: Needs  assistance Sitting-balance support: Feet supported Sitting balance-Leahy Scale: Fair                             ADL                                                Vision                     Perception     Praxis      Cognition   Behavior During Therapy: Flat affect Overall Cognitive Status: Impaired/Different from baseline Area of Impairment: Problem solving   Current Attention Level: Selective          Problem Solving: Slow processing;Difficulty sequencing      Extremity/Trunk Assessment               Exercises General Exercises - Upper Extremity Shoulder Flexion: AAROM;10 reps;Seated;Right;Left   Shoulder Instructions       General Comments      Pertinent Vitals/ Pain       Pain Assessment: Faces Faces Pain Scale: Hurts little more Pain Location: Lt shoulder with ROM and neck  Pain Descriptors / Indicators: Grimacing Pain Intervention(s): Monitored during session;Repositioned  Home Living  Prior Functioning/Environment              Frequency Min 2X/week     Progress Toward Goals  OT Goals(current goals can now be found in the care plan section)  Progress towards OT goals: Progressing toward goals     Plan Discharge plan remains appropriate    Co-evaluation                 End of Session Equipment Utilized During Treatment: Oxygen   Activity Tolerance Patient limited by fatigue   Patient Left in bed;with call bell/phone within reach;with bed alarm set   Nurse Communication Mobility status        Time: [id number] OT Time Calculation (min): 15 min  Charges: OT General Charges $OT Visit: 1 Procedure OT Treatments $Therapeutic Activity: 8-22 mins  Tanner Campbell M 03/19/2016, 3:44 PM

## 2016-03-19 NOTE — Progress Notes (Signed)
PROGRESS NOTE    Tanner Campbell  WUX:[id number] DOB: May 19, 1976 DOA: 02/09/2016 PCP: No primary care provider on file.   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  He decompensated 6/23 and required intubation. The patient was found to have H. Influenza positive sputum and treated with Rocephin. CXR demonstrated a large right pleural effusion and he underwent a thoracentesis (1L serous fluid removed - exudative by protein). He had difficulty with weaning and was transferred to [facility] on 6/29 orally intubated for ventilator weaning. The patient developed fever and antibiotics were expanded to vancomycin, cefepime and diflucan. He had progressed to weaning on PSV x 4 hours as of 7/3. He self extubated on 7/5. Since that time he required BiPAP off and on. After HD 7/10 his mental status worsened and he became minimally responsive. CXR was consistent with RLL opacification and he was re-intubated. He was re-cultured and also found to have increased Troponin for which Cardiology was consulted. He was placed on LMWH, but had a GIB while on this w/ his hgb dropping as low as 8.4.  On 7/14 he remained ventilator dependent. His sputum cultures grew MSSA and E coli and he was noted to have + blood cultures. Because of these acutely worsening issues it was felt best to transfer him to the ICU at [facility].   Subjective: 8/15 A/O 4, MAXIMUM TEMPERATURE last 24 hours 37.9C. States was able to sit in chair almost the entire HD session today.       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   Pericardial effusion   Anemia, chronic disease   Acute blood loss anemia   Severe malnutrition (HCC)   HCAP (healthcare-associated pneumonia)   Aortic regurgitation   Dysphagia   Chronic back pain   Respiratory failure ([name])   Encounter for feeding tube placement   COPD exacerbation ([name])   Uncontrolled type 2 diabetes mellitus with complication ([name])    Acute hypoxic respiratory failure - s/p trach 7/18 - HCAP - Completed a course of Cefepime  -DuoNeb QID -Patient decannulated, doing well  Tracheostomy -See respiratory failure  Bacteremia with positive Veillonella species (Gram negative rod) -Completed Flagyl x 2 weeks for bacteremia as per ID recs   positive coag negative staph (normally considered contaminant)/Leukocytosis -Given patient's improving leukocytosis and very frail condition. Continue vancomycin until normalization of leukocytosis. Otherwise patient will not be able to have cardiac catheterization: Cardiology has been very reluctant given patient's frail condition to perform catheterization. -WBC bumped up today. But patient sitting on side of bed feels improved, will monitor   Bilateral pleural effusions s/p thoracentesis -See acute hypoxic respiratory failure  NSTEMI vs demand ischemia -Cardiology suggest he will need an eventual cardiac cath  - Continue to maximize respiratory status -Strict in and out since admission + 3.7 L -Daily weight Filed Weights   03/18/16 0448 03/19/16  0500 03/19/16 0743  Weight: 60 kg (132 lb 4.4 oz) 59 kg (130 lb 1.1 oz) 61 kg (134 lb 7.7 oz)  -Manage fluids per HD;See pleural effusion  -Transfuse for hemoglobin<8 Recent Labs Lab 03/14/16 0900 03/15/16 0921 03/16/16 0757 03/16/16 2302 03/19/16 0357  HGB 9.2* 8.7* 9.1* 8.6* 9.0*  -8/7  [facility] Cleared patient for catheterization -8/15 spoke with Dr. Sherren Mocha Cardiology, is reviewing chart and will see patient today does not believe a cardiac catheterization will benefit patient.   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  Chronic back pain -Morphine -Neurontin BID -Air mattress, ensure on rotation at all times    Goals of care -PT recommended CIR  - CIR felt he is more appropriate for LTACH - sister does not want him to go back to [facility] because she didn't feel he was being treated well - plan is for SNF placement, but will have to be decannulated as he is ESRD and requires HD and there are no area SNFs that accept ESRD AND trach patients   -Consult psychiatry on 7/29 to ensure patient has competency. Patient's family unrealistic in their expectations therefore would like patient to make decisions if deemed competent -8/13 meeting with patient/sister/brother-in-law explain plan of care for next week 1. Patient thinks he can sit in HCT chair for 4 hours at next session. 2. Cardiac catheterization? 3. Clearance by cardiology to travel to [place] SNF   DVT prophylaxis: Subcutaneous heparin Code Status: Full Family Communication: Spoke with sister and brother-in-law Disposition Plan: SNF   Consultants:  PCCM Dr. Lyman Bishop Cardiology  [facility] Nephrology  Dr.Su [facility] ENT   Procedures/Significant Events:  7/21 TEE;- No  evidence of endocarditis.-Negative vegetation- Aortic insufficiency is seen, with a central jet. It   is likely due to degenerative changes.   7/24 transfuse 1U PRBC 7/27 PCXR: Cardiomegaly with bilateral from interstitial prominence and bilateral effusions C/W CHF, PNA cannot be excluded.- basilar atelectasis  7/27 transfused 1 unit PRBC 8/2 nuclear medicine myocardial perfusion scan:-no ST segment deviation noted during stress. -Defect 1: medium defect of severe severity present in the basal inferior and mid inferior location.C/W  with prior myocardial infarction.-LVEF=(30-44%).,Global hypokinesis worse in the inferior and inferoseptal regions. -Reversible defect in the inferior and inferoseptal region corresponding to wall motion abnormality seen on echo 8/13 CT head W Wo contrast:-No acute infarct- Small chronic infarcts at the right frontal and parietal lobes MBS 8/13: Passed   Cultures 7/14 MRSA by PCR negative 7/18 blood right hand x2 pending 7/30 blood right hand NGTD 7/30 blood right arm positive coag negative staph(most likely contaminant) 7/30 trach aspirate positive multiple organisms none predominant 8/9 blood right hand positive coag negative staph (normally considered contaminant) 8/9 blood right AC NGTD   Antimicrobials: Cipro 7/12>7/14 Vanc 7/2>7/17 Cefepime 7/14>7/21 Fluconazole 7/7>7/17 Flagyl 7/17 > 8/1 Vancomycin 8/10>>  Devices 7/25 uncuffed #6 [place]>>   LINES / TUBES:  7/29 CorTrak tube placed    Continuous Infusions:         Objective: Vitals:   03/19/16 0900 03/19/16 0930 03/19/16 1000 03/19/16 1030  BP: 127/78 132/76 (!) 144/82 (!) 162/88  Pulse: 97 93 93 95  Resp:  16 16 16   Temp:      TempSrc:      SpO2:      Weight:      Height:        Intake/Output Summary (Last 24 hours) at 03/19/16 1122 Last data filed at 03/19/16 0440  Gross per 24 hour  Intake              580 ml  Output                0 ml  Net              580 ml    Filed Weights   03/18/16 0448 03/19/16 0500 03/19/16 0743  Weight: 60 kg (132 lb 4.4 oz) 59 kg (130 lb 1.1 oz) 61 kg (134 lb 7.7 oz)    Examination:  General: A/O 4 , Sitting on edge of bed follows commands,Negative acute respiratory distress  Eyes: negative scleral hemorrhage, negative anisocoria, negative icterus ENT: Negative Runny nose, negative gingival bleeding, Neck:  Negative scars, masses, torticollis, lymphadenopathy, JVD Lungs: clear to auscultation bilateral, positive by basilar crackles, negative wheezes Cardiovascular:  Regular rhythm and rate without murmur gallop or rub normal S1 and S2 Abdomen: negative abdominal pain, nondistended, positive soft, bowel sounds, no rebound, no ascites, no appreciable mass Extremities: No significant cyanosis, clubbing, or edema bilateral lower extremities Skin: Negative rashes, lesions, ulcers Psychiatric:  Unable to evaluate  Central nervous system:  Cranial nerves II through XII intact, follows commands moves extremities   .     Data Reviewed: Care during the described time interval was provided by me .  I have reviewed this patient's available data, including medical history, events of note, physical examination, and all test results as part of my evaluation. I have personally reviewed and interpreted all radiology studies.  CBC:  Recent Labs Lab 03/14/16 0900 03/15/16 0921 03/16/16 0757 03/16/16 2302 03/19/16 0357  WBC 18.7* 12.7* 12.9* 11.5* 16.0*  NEUTROABS 16.2* 10.3*  --   --   --   HGB 9.2* 8.7* 9.1* 8.6* 9.0*  HCT 31.3* 30.3* 30.6* 29.0* 31.2*  MCV 97.5 98.4 95.9 97.0 96.9  PLT 212 244 268 256 224   Basic Metabolic Panel:  Recent Labs Lab 03/14/16 0900 03/15/16 0921 03/16/16 0757 03/16/16 2302 03/19/16 0357  NA 135 134* 135 135 133*  K 5.1 3.9 4.1 3.1* 5.1  CL 96* 96* 95* 96* 94*  CO2 27 29 26 30 28   GLUCOSE 98 111* 143* 96 107*  BUN 37* 19 33* 16 50*  CREATININE 4.50* 3.07* 3.73* 2.13* 4.52*   CALCIUM 8.7* 8.5* 8.6* 8.0* 8.4*  MG 1.9 1.8  --   --   --   PHOS 4.7*  --  3.8  --   --    GFR: Estimated Creatinine Clearance: 18.6 mL/min (by C-G formula based on SCr of 4.52 mg/dL). Liver Function Tests:  Recent Labs Lab 03/14/16 0900 03/16/16 0757 03/16/16 2302 03/19/16 0357  AST  --   --  30 30  ALT  --   --  19 20  ALKPHOS  --   --  239* 231*  BILITOT  --   --  0.7 1.0  PROT  --   --  5.3* 5.8*  ALBUMIN 2.1* 1.9* 1.8* 1.8*   No results for input(s): LIPASE, AMYLASE in the last 168 hours. No results for input(s): AMMONIA in the last 168 hours. Coagulation Profile: No results for input(s): INR, PROTIME in the last 168 hours. Cardiac Enzymes: No results for input(s): CKTOTAL, CKMB, CKMBINDEX, TROPONINI in the last 168 hours. BNP (last 3 results) No results for input(s): PROBNP in the last 8760 hours. HbA1C: No results for input(s): HGBA1C in the last 72 hours. CBG:  Recent Labs Lab 03/18/16 1713 03/18/16 2107 03/19/16 0000 03/19/16 0018 03/19/16 0445  GLUCAP 166* 95 131* 143* 124*   Lipid Profile: No results for input(s): CHOL, HDL, LDLCALC, TRIG, CHOLHDL, LDLDIRECT in the last 72 hours. Thyroid Function Tests: No results for input(s): TSH, T4TOTAL, FREET4, T3FREE, THYROIDAB in the last 72 hours. Anemia Panel: No results for input(s): VITAMINB12, FOLATE, FERRITIN, TIBC, IRON, RETICCTPCT in the last 72 hours. Urine analysis:    Component Value Date/Time   COLORURINE RED (A) 02/04/2016 1426   APPEARANCEUR CLOUDY (A) 02/04/2016 1426   LABSPEC 1.025 02/04/2016 1426   PHURINE 7.5 02/04/2016 1426   GLUCOSEU 500 (A) 02/04/2016 1426   HGBUR SMALL (A) 02/04/2016 1426   BILIRUBINUR SMALL (A) 02/04/2016 1426   KETONESUR NEGATIVE 02/04/2016 1426   PROTEINUR >300 (A) 02/04/2016 1426   NITRITE NEGATIVE 02/04/2016 1426   LEUKOCYTESUR TRACE (A) 02/04/2016 1426   Sepsis Labs: @LABRCNTIP (procalcitonin:4,lacticidven:4)  ) Recent Results (from the past 240  hour(s))  Culture, blood (routine x 2)     Status: Abnormal (Preliminary result)   Collection Time: 03/29/2016  5:00 PM  Result Value Ref Range Status   Specimen Description BLOOD RIGHT HAND  Final   Special Requests BOTTLES DRAWN AEROBIC AND ANAEROBIC  5CC   Final   Culture  Setup Time   Final    GRAM POSITIVE COCCI IN CLUSTERS ANAEROBIC BOTTLE ONLY CRITICAL RESULT CALLED TO, READ BACK BY AND VERIFIED WITH: A. JOHNSTON, PHARM AT [id number] ON [id number] BY S. YARBROUGH    Culture (A)  Final    STAPHYLOCOCCUS SPECIES (COAGULASE NEGATIVE) Unable to obtain susceptibility results.  Sent to [place] for further susceptibility testing. RESULT CALLED TO, READ BACK BY AND VERIFIED WITH: D MUHORO,RN AT 0906 03/17/16 BY L BENFIELD    Report Status PENDING  Incomplete  Culture, blood (routine x 2)     Status: None   Collection Time: 03/21/2016  6:32 PM  Result Value Ref Range Status   Specimen Description BLOOD RIGHT ANTECUBITAL  Final   Special Requests IN PEDIATRIC BOTTLE [place]  Final   Culture NO GROWTH 5 DAYS  Final   Report Status 03/18/2016 FINAL  Final         Radiology Studies: No results found.      Scheduled Meds: . antiseptic oral rinse  7 mL Mouth Rinse QID  . aspirin  81 mg Oral Daily  . atorvastatin  80 mg Oral q1800  . bisacodyl  5 mg Oral BID  . chlorhexidine gluconate (SAGE KIT)  15 mL Mouth Rinse BID  . darbepoetin (ARANESP) injection - DIALYSIS  200 mcg Intravenous Q Thu-HD  . feeding supplement (ENSURE ENLIVE)  237 mL Oral BID PC  . feeding supplement (NEPRO CARB STEADY)  237 mL Oral BID BM  . gabapentin  300 mg Oral BID  . insulin aspart  0-9 Units Subcutaneous Q4H  . ipratropium-albuterol  3 mL Nebulization TID  . isosorbide dinitrate  10 mg Oral TID  . metoCLOPramide (REGLAN) injection  5 mg Intravenous Q6H  . metoprolol tartrate  25 mg Oral BID  . multivitamin  1 tablet Oral QHS  . pantoprazole  40 mg Oral Q1200  . polyethylene glycol  17 g Oral Daily  .  senna-docusate  1 tablet Oral BID  . vancomycin  500 mg Intravenous Q T,Th,Sa-HD   Continuous Infusions:     LOS: 32 days    Time spent: 40 minutes    Kynan Peasley, Geraldo Docker, MD [facility] Pager [phone]   If 7PM-7AM, please contact night-coverage [email or web address] Password [email or web address] 03/19/2016, 11:22 AM

## 2016-03-19 NOTE — NC FL2 (Signed)
[place] LEVEL OF CARE SCREENING TOOL     IDENTIFICATION  Patient Name: Tanner Campbell Birthdate: 1976/05/28 Sex: male Admission Date (Current Location): 02/17/2016  [facility] and [place] Number:  Herbalist and Address:  [facility] [place]. [facility], [place] [address], [place], [place] [address]      Provider Number: [id number]  Attending Physician Name and Address:  Allie Bossier, MD  Relative Name and Phone Number:  Emilio Aspen (sister) [phone]    Current Level of Care: Hospital Recommended Level of Care: [place] Prior Approval Number:    Date Approved/Denied:   PASRR Number: [id number]  Discharge Plan: SNF    Current Diagnoses: Patient Active Problem List   Diagnosis Date Noted  . Uncontrolled type 2 diabetes mellitus with complication ([place])   . Respiratory failure ([place])   . Encounter for feeding tube placement   . COPD exacerbation ([place])   . Dysphagia   . Chronic back pain   . HCAP (healthcare-associated pneumonia)   . Aortic regurgitation   . End-stage renal disease on hemodialysis ([place])   . 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 Pericardial effusion   . Anemia, chronic disease   . Acute blood loss anemia   . Severe malnutrition ([place])   . Tracheostomy status ([place])   . Bacteremia 02/19/2016  . Acute on chronic systolic CHF (congestive heart failure), NYHA class 3 ([place])   . NSTEMI (non-ST elevated myocardial infarction) ([place])   . Protein-calorie malnutrition, severe 02/17/2016  . Acute respiratory failure ([place])   . ESRD (end stage renal disease) ([place])   . Bilateral pneumonia   . Elevated troponin 02/12/2016  . CHF (congestive heart failure) ([place])   . Pleural effusion   . Pressure ulcer 01/31/2016  . S/P thoracentesis   . Bleeding from open wound of chest wall   . Acute on chronic renal failure ([place])   . Hypoxia 01/25/2016  . Dyspnea 01/25/2016  . Hyperkalemia 01/25/2016  . Acute on chronic  respiratory failure ([place]) 01/25/2016  . Accelerated hypertension 12/22/2015  . Flash pulmonary edema ([place]) 12/22/2015  . Sepsis ([place]) 12/22/2015  . ESRD on dialysis ([place]) 12/22/2015  . Type 2 diabetes mellitus ([place]) 12/22/2015  . COPD (chronic obstructive pulmonary disease) ([place]) 12/22/2015    Orientation RESPIRATION BLADDER Height & Weight     Self, Time, Situation, Place  O2 ([place] 2L) Continent Weight: 61 kg (134 lb 7.7 oz) Height:  _0  (165.1 cm)  BEHAVIORAL SYMPTOMS/MOOD NEUROLOGICAL BOWEL NUTRITION STATUS   (NONE)  (NONE) Continent Diet  AMBULATORY STATUS COMMUNICATION OF NEEDS Skin   Extensive Assist Verbally PU Stage and Appropriate Care, Surgical wounds (Stage II PU on Ischial Tuberosity;Stage II on sacrum; closed incision on neck)   PU Stage 2 Dressing:  (Foam Dressing PRN )                   Personal Care Assistance Level of Assistance  Bathing, Dressing, Feeding Bathing Assistance: Maximum assistance Feeding assistance: Limited assistance Dressing Assistance: Maximum assistance     Functional Limitations Info  Sight, Hearing, Speech Sight Info: Adequate Hearing Info: Adequate Speech Info: Adequate    SPECIAL CARE FACTORS FREQUENCY  OT (By licensed OT)     PT Frequency: 5x/week OT Frequency: 5/wk            Contractures Contractures Info: Not present    Additional Factors Info  Code Status, Allergies, Insulin Sliding Scale Code Status Info:  Full Allergies Info: Penicillins   Insulin Sliding Scale Info: insulin aspart (novoLOG) injection 0-9 Units       Current Medications (03/19/2016):  This is the current hospital active medication list Current Facility-Administered Medications  Medication Dose Route Frequency Provider Last Rate Last Dose  . 0.9 %  sodium chloride infusion  100 mL Intravenous PRN Edrick Oh, MD      . 0.9 %  sodium chloride infusion  100 mL Intravenous PRN Edrick Oh, MD      . acetaminophen (TYLENOL) tablet 650 mg  650 mg  Oral Q4H PRN Jeryl Columbia, NP   650 mg at 03/18/16 0407  . albuterol (PROVENTIL) (2.5 MG/3ML) 0.083% nebulizer solution 2.5 mg  2.5 mg Nebulization Q2H PRN Cherene Altes, MD   2.5 mg at 03/14/16 1400  . alteplase (CATHFLO ACTIVASE) injection 2 mg  2 mg Intracatheter Once PRN Edrick Oh, MD      . antiseptic oral rinse solution (CORINZ)  7 mL Mouth Rinse QID Collene Gobble, MD   7 mL at 03/19/16 0456  . aspirin chewable tablet 81 mg  81 mg Oral Daily Cherene Altes, MD   81 mg at 03/18/16 1109  . atorvastatin (LIPITOR) tablet 80 mg  80 mg Oral q1800 Cherene Altes, MD   80 mg at 03/18/16 1728  . bisacodyl (DULCOLAX) EC tablet 5 mg  5 mg Oral BID Allie Bossier, MD   5 mg at 03/18/16 2130  . chlorhexidine gluconate (SAGE KIT) (PERIDEX) 0.12 % solution 15 mL  15 mL Mouth Rinse BID Collene Gobble, MD   15 mL at 03/18/16 2138  . Darbepoetin Alfa (ARANESP) injection 200 mcg  200 mcg Intravenous Q Thu-HD Allie Bossier, MD   200 mcg at 03/14/16 1021  . dextrose 50 % solution 25 mL  25 mL Intravenous PRN Gardiner Barefoot, NP   25 mL at 02/29/16 2050  . diphenhydrAMINE (BENADRYL) 12.5 MG/5ML elixir 12.5-25 mg  12.5-25 mg Oral Q8H PRN Etta Quill, DO   12.5 mg at 03/18/16 0407  . feeding supplement (ENSURE ENLIVE) (ENSURE ENLIVE) liquid 237 mL  237 mL Oral BID PC Allie Bossier, MD   Stopped at 03/17/16 0900  . feeding supplement (NEPRO CARB STEADY) liquid 237 mL  237 mL Oral BID BM Allie Bossier, MD   237 mL at 03/18/16 2129  . gabapentin (NEURONTIN) tablet 300 mg  300 mg Oral BID Cherene Altes, MD   300 mg at 03/18/16 2130  . heparin injection 1,000 Units  1,000 Units Dialysis PRN Edrick Oh, MD      . insulin aspart (novoLOG) injection 0-9 Units  0-9 Units Subcutaneous Q4H Allie Bossier, MD   1 Units at 03/19/16 0455  . ipratropium-albuterol (DUONEB) 0.5-2.5 (3) MG/3ML nebulizer solution 3 mL  3 mL Nebulization TID Allie Bossier, MD   3 mL at 03/19/16 0721  . isosorbide  dinitrate (ISORDIL) tablet 10 mg  10 mg Oral TID Cherene Altes, MD   10 mg at 03/18/16 2130  . lidocaine (PF) (XYLOCAINE) 1 % injection 5 mL  5 mL Intradermal PRN Edrick Oh, MD      . lidocaine-prilocaine (EMLA) cream 1 application  1 application Topical PRN Edrick Oh, MD      . metoCLOPramide (REGLAN) injection 5 mg  5 mg Intravenous Q6H Cherene Altes, MD   5 mg at 03/19/16 0510  . metoprolol (LOPRESSOR) injection 5  mg  5 mg Intravenous QID PRN Sela Hua, MD      . metoprolol tartrate (LOPRESSOR) tablet 25 mg  25 mg Oral BID Cherene Altes, MD   25 mg at 03/18/16 2130  . morphine 2 MG/ML injection 1-2 mg  1-2 mg Intravenous Q2H PRN Cherene Altes, MD   2 mg at 03/18/16 1727  . multivitamin (RENA-VIT) tablet 1 tablet  1 tablet Oral QHS Collene Gobble, MD   1 tablet at 03/18/16 2130  . nitroGLYCERIN (NITROSTAT) SL tablet 0.4 mg  0.4 mg Sublingual Q5 min PRN Anders Simmonds, MD   0.4 mg at 03/05/16 0657  . ondansetron (ZOFRAN) injection 4 mg  4 mg Intravenous Q6H PRN Allie Bossier, MD   4 mg at 03/19/16 0035  . oxyCODONE (Oxy IR/ROXICODONE) immediate release tablet 5 mg  5 mg Oral Q4H PRN Cherene Altes, MD   5 mg at 03/17/16 2338  . pantoprazole (PROTONIX) EC tablet 40 mg  40 mg Oral Q1200 Cherene Altes, MD   40 mg at 03/18/16 1223  . pentafluoroprop-tetrafluoroeth (GEBAUERS) aerosol 1 application  1 application Topical PRN Edrick Oh, MD      . polyethylene glycol (MIRALAX / GLYCOLAX) packet 17 g  17 g Oral Daily Allie Bossier, MD   17 g at 03/18/16 1106  . senna-docusate (Senokot-S) tablet 1 tablet  1 tablet Oral BID Donita Brooks, NP   1 tablet at 03/18/16 2129  . sennosides (SENOKOT) 8.8 MG/5ML syrup 10 mL  10 mL Oral QHS PRN Rigoberto Noel, MD   10 mL at 02/25/16 2220  . vancomycin (VANCOCIN) 500 mg in sodium chloride 0.9 % 100 mL IVPB  500 mg Intravenous Q T,Th,Sa-HD Allie Bossier, MD         Discharge Medications: Please see discharge summary for a list of  discharge medications.  Relevant Imaging Results:  Relevant Lab Results:   Additional Information TTS dialysis ( [facility])  T[place]ani [place], [place], [place]

## 2016-03-19 NOTE — Progress Notes (Signed)
Speech Language Pathology Treatment: Dysphagia  Patient Details Name: Tanner Campbell MRN: [id number] DOB: [birth year] Today's Date: 03/19/2016 Time: 6644-0347 SLP Time Calculation (min) (ACUTE ONLY): 15 min  Assessment / Plan / Recommendation Clinical Impression  Pt seen for dysphagia followup. Pt able to recall majority of swallow precautions. Reinforced the importance of slow rate and small bites/sips. Pt able to demonstrate precautions [facility] after initial review. Education completed re: rationale for nectar-thick- pt verbalized understanding. Recommend: Continue with current diet will follow and advance as appropriate.    HPI HPI: 40 year old male admitted 02/24/2016 due to recurrent sepsis, failure to wean, multiple intubations. PMH significant forDM2, HTN, HLD, ESRD on HD, COPD, and ACDF C5-6 2009. Trach placed 02/12/2016.  MBS 8/2 silent aspiration thins and nectar; started on Dys 2 diet with nectar thick liquids with strict use of chin tuck for airway protection.  Self decannulated 03/14/16.      SLP Plan  Continue with current plan of care     Recommendations  Diet recommendations: Dysphagia 2 (fine chop);Nectar-thick liquid Liquids provided via: Cup Medication Administration: Crushed with puree Supervision: Patient able to self feed;Full supervision/cueing for compensatory strategies Compensations: Slow rate;Small sips/bites;Multiple dry swallows after each bite/sip Postural Changes and/or Swallow Maneuvers: Seated upright 90 degrees;Upright 30-60 min after meal;Chin tuck             Plan: Continue with current plan of care     GO                [name] Crafts MA, CCC-SLP 03/19/2016, 4:32 PM

## 2016-03-19 NOTE — Progress Notes (Signed)
Nutrition Follow-up  DOCUMENTATION CODES:   Severe malnutrition in context of acute illness/injury  INTERVENTION:    Continue Nepro Shake po BID, each supplement provides 425 kcal and 19 grams protein   Continue Ensure Enlive po BID, each supplement provides 350 kcal and 20 grams of protein  NUTRITION DIAGNOSIS:   Increased nutrient needs related to chronic illness, wound healing as evidenced by estimated needs, ongoing  GOAL:   Patient will meet greater than or equal to 90% of their needs, progressing  MONITOR:   PO intake, Supplement acceptance, Labs, Weight trends, Skin, I & O's  ASSESSMENT:   40 year old esrd admitted initially to [facility] from [facility] d/t PNA and failure to wean. Self extubated 7/5. Clinically deteriorated over the following days. Required re-intubation 7/10 w/ findings of new HCAP and bacteremia. Transferred to [facility] on 7/14 with concerns for sepsis, NSTEMI, GIB, and endocarditis.   Pt continues on a Dys 2-nectar thick liquid diet. PO intake variable at 0-100% per flowsheet records. Pt self-decannulated 8/9. Having some nausea & vomiting. Nephrology following for HD. Receiving Ensure Enlive & Nepro supplements.  Diet Order:  DIET DYS 2 Room service appropriate? Yes; Fluid consistency: Nectar Thick; Fluid restriction: 1200 mL Fluid  Skin:  Wound (see comment) (stage II pressure ulcers on sacrum and ischial tuberosity)  Last BM:  8/8  Height:   Ht Readings from Last 1 Encounters:  02/27/2016 5\' 5"  (1.651 m)    Weight:   Wt Readings from Last 1 Encounters:  03/19/16 130 lb 1.1 oz (59 kg)   8/14  132 lb 8/13  123 lb 8/12  125 lb 8/10  121 lb 8/09  121 lb 8/08  112 lb 8/07  103 lb 8/07  126 lb 8/06  122 lb 8/05  124 lb 8/04  120 lb 8/03  124 lb 8/02  126 lb  Ideal Body Weight:  59.1 kg  BMI:  Body mass index is 21.64 kg/m.  Estimated Nutritional Needs:   Kcal:  1800-2050  Protein:  85-100 gm  Fluid:  1.7-1.9 L  EDUCATION NEEDS:    No education needs identified at this time  Maureen Chatters, RD, LDN Pager #: [phone] After-Hours Pager #: [phone]

## 2016-03-20 DIAGNOSIS — I6930 Unspecified sequelae of cerebral infarction: Secondary | ICD-10-CM

## 2016-03-20 DIAGNOSIS — I6350 Cerebral infarction due to unspecified occlusion or stenosis of unspecified cerebral artery: Secondary | ICD-10-CM

## 2016-03-20 LAB — CBC WITH DIFFERENTIAL/PLATELET
Basophils Absolute: 0 10*3/uL (ref 0.0–0.1)
Basophils Relative: 0 %
Eosinophils Absolute: 0 10*3/uL (ref 0.0–0.7)
Eosinophils Relative: 0 %
HEMATOCRIT: 29 % — AB (ref 39.0–52.0)
HEMOGLOBIN: 8.5 g/dL — AB (ref 13.0–17.0)
LYMPHS ABS: 1 10*3/uL (ref 0.7–4.0)
LYMPHS PCT: 8 %
MCH: 28.2 pg (ref 26.0–34.0)
MCHC: 29.3 g/dL — AB (ref 30.0–36.0)
MCV: 96.3 fL (ref 78.0–100.0)
Monocytes Absolute: 2.2 10*3/uL — ABNORMAL HIGH (ref 0.1–1.0)
Monocytes Relative: 18 %
NEUTROS ABS: 9.1 10*3/uL — AB (ref 1.7–7.7)
Neutrophils Relative %: 74 %
Platelets: 246 10*3/uL (ref 150–400)
RBC: 3.01 MIL/uL — ABNORMAL LOW (ref 4.22–5.81)
RDW: 18.5 % — AB (ref 11.5–15.5)
WBC: 12.4 10*3/uL — AB (ref 4.0–10.5)

## 2016-03-20 LAB — GLUCOSE, CAPILLARY
GLUCOSE-CAPILLARY: 151 mg/dL — AB (ref 65–99)
GLUCOSE-CAPILLARY: 262 mg/dL — AB (ref 65–99)
GLUCOSE-CAPILLARY: 187 mg/dL — AB (ref 65–99)
Glucose-Capillary: 145 mg/dL — ABNORMAL HIGH (ref 65–99)
Glucose-Capillary: 175 mg/dL — ABNORMAL HIGH (ref 65–99)
Glucose-Capillary: 276 mg/dL — ABNORMAL HIGH (ref 65–99)

## 2016-03-20 LAB — BASIC METABOLIC PANEL
ANION GAP: 8 (ref 5–15)
BUN: 29 mg/dL — ABNORMAL HIGH (ref 6–20)
CALCIUM: 8.2 mg/dL — AB (ref 8.9–10.3)
CHLORIDE: 96 mmol/L — AB (ref 101–111)
CO2: 29 mmol/L (ref 22–32)
CREATININE: 2.47 mg/dL — AB (ref 0.61–1.24)
GFR calc Af Amer: 36 mL/min — ABNORMAL LOW (ref >60)
GFR calc non Af Amer: 31 mL/min — ABNORMAL LOW (ref >60)
GLUCOSE: 125 mg/dL — AB (ref 65–99)
Potassium: 4.3 mmol/L (ref 3.5–5.1)
Sodium: 133 mmol/L — ABNORMAL LOW (ref 135–145)

## 2016-03-20 LAB — BLOOD GAS, ARTERIAL
Acid-Base Excess: 5.8 mmol/L — ABNORMAL HIGH (ref 0.0–2.0)
Bicarbonate: 30.3 mEq/L — ABNORMAL HIGH (ref 20.0–24.0)
Drawn by: 100061
FIO2: 40
O2 Saturation: 98.3 %
PCO2 ART: 49 mmHg — AB (ref 35.0–45.0)
PH ART: 7.408 (ref 7.350–7.450)
Patient temperature: 98.6
TCO2: 31.9 mmol/L (ref 0–100)
pO2, Arterial: 102 mmHg — ABNORMAL HIGH (ref 80.0–100.0)

## 2016-03-20 LAB — MAGNESIUM: Magnesium: 1.6 mg/dL — ABNORMAL LOW (ref 1.7–2.4)

## 2016-03-20 NOTE — Progress Notes (Signed)
Pt pulled trach out on 03/19/2016. Pt has gaze over stoma. Pt on 2 LPMNC. Pt in no distress at this time. Pt does have trach supplies in room if needed.

## 2016-03-20 NOTE — Progress Notes (Signed)
PROGRESS NOTE    Tanner Campbell  OJJ:[id number] DOB: 04-24-1976 DOA: 02/27/2016 PCP: No primary care provider on file.   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  He decompensated 6/23 and required intubation. The patient was found to have H. Influenza positive sputum and treated with Rocephin. CXR demonstrated a large right pleural effusion and he underwent a thoracentesis (1L serous fluid removed - exudative by protein). He had difficulty with weaning and was transferred to [facility] on 6/29 orally intubated for ventilator weaning. The patient developed fever and antibiotics were expanded to vancomycin, cefepime and diflucan. He had progressed to weaning on PSV x 4 hours as of 7/3. He self extubated on 7/5. Since that time he required BiPAP off and on. After HD 7/10 his mental status worsened and he became minimally responsive. CXR was consistent with RLL opacification and he was re-intubated. He was re-cultured and also found to have increased Troponin for which Cardiology was consulted. He was placed on LMWH, but had a GIB while on this w/ his hgb dropping as low as 8.4.  On 7/14 he remained ventilator dependent. His sputum cultures grew MSSA and E coli and he was noted to have + blood cultures. Because of these acutely worsening issues it was felt best to transfer him to the ICU at [facility].   Subjective: 8/16 A/O 4, MAXIMUM TEMPERATURE last 24 hours 38.4C. 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   Pericardial effusion   Anemia, chronic disease   Acute blood loss anemia   Severe malnutrition (HCC)   HCAP (healthcare-associated pneumonia)   Aortic regurgitation   Dysphagia   Chronic back pain   Respiratory failure ([place])   Encounter for feeding tube placement   COPD exacerbation ([place])   Uncontrolled type 2 diabetes mellitus with complication ([place])    Acute hypoxic respiratory failure - s/p trach 7/18 - HCAP - Completed a course of Cefepime  -DuoNeb QID -Patient decannulated, doing well  Tracheostomy -See respiratory failure  Bacteremia with positive Veillonella species (Gram negative rod) -Completed Flagyl x 2 weeks for bacteremia as per ID recs   positive coag negative staph (normally considered contaminant)/Leukocytosis -Given patient's improving leukocytosis and very frail condition. Continue vancomycin until normalization of leukocytosis. Otherwise patient will not be able to have cardiac catheterization: Cardiology has been very reluctant given patient's frail condition to perform catheterization. -WBC bumped up today. But patient sitting on side of bed feels improved, will monitor   Bilateral pleural effusions s/p thoracentesis -See acute hypoxic respiratory failure  NSTEMI vs demand ischemia -Cardiology suggest he will need an eventual cardiac cath  - Continue to maximize respiratory status -Strict in and out since admission + 3.8 L (question accuracy) -Daily weight Filed Weights   03/19/16 0743 03/19/16  1155 03/20/16 0338  Weight: 61 kg (134 lb 7.7 oz) 59 kg (130 lb 1.1 oz) 57 kg (125 lb 10.6 oz)  -Manage fluids per HD;See pleural effusion  -Transfuse for hemoglobin<8  Recent Labs Lab 03/15/16 0921 03/16/16 0757 03/16/16 2302 03/19/16 0357 03/20/16 0551  HGB 8.7* 9.1* 8.6* 9.0*  8.5*  -8/7 [facility] Cleared patient for catheterization -8/15 Dr. Sherren Mocha Cardiology Recommendation:, I do not think further invasive cardiac evaluation will impact this patient's outcome in a positive way.No cardiac contraindication for him to transfer out to a rehab facility, but recommends local facility.   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  Chronic back pain -Morphine -Neurontin BID -Air mattress, ensure on rotation at all times    Goals of care -PT recommended CIR  - CIR felt he is more appropriate for LTACH - sister does not want him to go back to [facility] because she didn't feel he was being treated well - plan is for SNF placement, but will have to be decannulated as he is ESRD and requires HD and there are no area SNFs that accept ESRD AND trach patients   -Consult psychiatry on 7/29 to ensure patient has competency. Patient's family unrealistic in their expectations therefore would like patient to make decisions if deemed competent -8/13 meeting with patient/sister/brother-in-law explain plan of care for next week 1. Patient thinks he can sit in HCT chair for 4 hours at next session. 2. Cardiac catheterization? 3. Clearance by cardiology to travel to [place] SNF   DVT prophylaxis: Subcutaneous heparin Code Status: Full Family Communication: Spoke with sister and  brother-in-law Disposition Plan: SNF   Consultants:  PCCM Dr. Lyman Bishop Cardiology  ID Nephrology  Dr.Su [facility]NT   Procedures/Significant Events:  7/21 TEE;- No evidence of endocarditis.-Negative vegetation- Aortic insufficiency is seen, with a central jet. It   is likely due to degenerative changes.   7/24 transfuse 1U PRBC 7/27 PCXR: Cardiomegaly with bilateral from interstitial prominence and bilateral effusions C/W CHF, PNA cannot be excluded.- basilar atelectasis  7/27 transfused 1 unit PRBC 8/2 nuclear medicine myocardial perfusion scan:-no ST segment deviation noted during stress. -Defect 1: medium defect of severe severity present in the basal inferior and mid inferior location.C/W  with prior myocardial infarction.-LVEF=(30-44%).,Global hypokinesis worse in the inferior and inferoseptal regions. -Reversible defect in the inferior and inferoseptal region corresponding to wall motion abnormality seen on echo 8/13 CT head W Wo contrast:-No acute infarct- Small chronic infarcts at the right frontal and parietal lobes MBS 8/13: Passed   Cultures 7/14 MRSA by PCR negative 7/18 blood right hand x2 pending 7/30 blood right hand NGTD 7/30 blood right arm positive coag negative staph(most likely contaminant) 7/30 trach aspirate positive multiple organisms none predominant 8/9 blood right hand positive coag negative staph (normally considered contaminant) 8/9 blood right AC NGTD   Antimicrobials: Cipro  7/12>7/14 Vanc 7/2>7/17 Cefepime 7/14>7/21 Fluconazole 7/7>7/17 Flagyl 7/17 > 8/1 Vancomycin 8/10>>  Devices 7/25 uncuffed #6 [place]>>   LINES / TUBES:  7/29 CorTrak tube placed    Continuous Infusions:         Objective: Vitals:   03/20/16 0503 03/20/16 0600 03/20/16 0700 03/20/16 0810  BP: 110/67 (!) 151/84 129/68 (!) 152/80  Pulse:  91    Resp: _0 (!) 6  Temp:    97.9 F (36.6 C)  TempSrc:    Oral  SpO2:  100%  99%  Weight:      Height:         Intake/Output Summary (Last 24 hours) at 03/20/16 0915 Last data filed at 03/20/16 0300  Gross per 24 hour  Intake              120 ml  Output             1500 ml  Net            -1380 ml   Filed Weights   03/19/16 0743 03/19/16 1155 03/20/16 0338  Weight: 61 kg (134 lb 7.7 oz) 59 kg (130 lb 1.1 oz) 57 kg (125 lb 10.6 oz)    Examination:  General: A/O 4 , Sitting on edge of bed follows commands,Negative acute respiratory distress. Very frustrated overall medical condition.  Eyes: negative scleral hemorrhage, negative anisocoria, negative icterus ENT: Negative Runny nose, negative gingival bleeding, Neck:  Negative scars, masses, torticollis, lymphadenopathy, JVD Lungs: clear to auscultation bilateral, positive by basilar crackles, negative wheezes Cardiovascular:  Regular rhythm and rate without murmur gallop or rub normal S1 and S2 Abdomen: negative abdominal pain, nondistended, positive soft, bowel sounds, no rebound, no ascites, no appreciable mass Extremities: No significant cyanosis, clubbing, or edema bilateral lower extremities Skin: Negative rashes, lesions, ulcers Psychiatric:  Unable to evaluate  Central nervous system:  Cranial nerves II through XII intact, follows commands moves extremities   .     Data Reviewed: Care during the described time interval was provided by me .  I have reviewed this patient's available data, including medical history, events of note, physical examination, and all test results as part of my evaluation. I have personally reviewed and interpreted all radiology studies.  CBC:  Recent Labs Lab 03/14/16 0900 03/15/16 0921 03/16/16 0757 03/16/16 2302 03/19/16 0357 03/20/16 0551  WBC 18.7* 12.7* 12.9* 11.5* 16.0* 12.4*  NEUTROABS 16.2* 10.3*  --   --   --  9.1*  HGB 9.2* 8.7* 9.1* 8.6* 9.0* 8.5*  HCT 31.3* 30.3* 30.6* 29.0* 31.2* 29.0*  MCV 97.5 98.4 95.9 97.0 96.9 96.3  PLT 212 244 268 256 302 244   Basic Metabolic  Panel:  Recent Labs Lab 03/14/16 0900 03/15/16 0921 03/16/16 0757 03/16/16 2302 03/19/16 0357 03/20/16 0551  NA 135 134* 135 135 133* 133*  K 5.1 3.9 4.1 3.1* 5.1 4.3  CL 96* 96* 95* 96* 94* 96*  CO2 _1 GLUCOSE 98 111* 143* 96 107* 125*  BUN 37* 19 33* 16 50* 29*  CREATININE 4.50* 3.07* 3.73* 2.13* 4.52* 2.47*  CALCIUM 8.7* 8.5* 8.6* 8.0* 8.4* 8.2*  MG 1.9 1.8  --   --   --  1.6*  PHOS 4.7*  --  3.8  --   --   --    GFR: Estimated Creatinine Clearance: 31.7 mL/min (by C-G formula based on SCr of 2.47 mg/dL). Liver Function Tests:  Recent  Labs Lab 03/14/16 0900 03/16/16 0757 03/16/16 2302 03/19/16 0357  AST  --   --  30 30  ALT  --   --  19 20  ALKPHOS  --   --  239* 231*  BILITOT  --   --  0.7 1.0  PROT  --   --  5.3* 5.8*  ALBUMIN 2.1* 1.9* 1.8* 1.8*   No results for input(s): LIPASE, AMYLASE in the last 168 hours. No results for input(s): AMMONIA in the last 168 hours. Coagulation Profile: No results for input(s): INR, PROTIME in the last 168 hours. Cardiac Enzymes: No results for input(s): CKTOTAL, CKMB, CKMBINDEX, TROPONINI in the last 168 hours. BNP (last 3 results) No results for input(s): PROBNP in the last 8760 hours. HbA1C: No results for input(s): HGBA1C in the last 72 hours. CBG:  Recent Labs Lab 03/19/16 1648 03/19/16 2009 03/19/16 2348 03/20/16 0329 03/20/16 0807  GLUCAP 306* 255* 282* 175* 151*   Lipid Profile: No results for input(s): CHOL, HDL, LDLCALC, TRIG, CHOLHDL, LDLDIRECT in the last 72 hours. Thyroid Function Tests: No results for input(s): TSH, T4TOTAL, FREET4, T3FREE, THYROIDAB in the last 72 hours. Anemia Panel: No results for input(s): VITAMINB12, FOLATE, FERRITIN, TIBC, IRON, RETICCTPCT in the last 72 hours. Urine analysis:    Component Value Date/Time   COLORURINE RED (A) 02/04/2016 1426   APPEARANCEUR CLOUDY (A) 02/04/2016 1426   LABSPEC 1.025 02/04/2016 1426   PHURINE 7.5 02/04/2016 1426   GLUCOSEU  500 (A) 02/04/2016 1426   HGBUR SMALL (A) 02/04/2016 1426   BILIRUBINUR SMALL (A) 02/04/2016 1426   KETONESUR NEGATIVE 02/04/2016 1426   PROTEINUR >300 (A) 02/04/2016 1426   NITRITE NEGATIVE 02/04/2016 1426   LEUKOCYTESUR TRACE (A) 02/04/2016 1426   Sepsis Labs: _0 (procalcitonin:4,lacticidven:4)  ) Recent Results (from the past 240 hour(s))  Culture, blood (routine x 2)     Status: Abnormal (Preliminary result)   Collection Time: 04/01/2016  5:00 PM  Result Value Ref Range Status   Specimen Description BLOOD RIGHT HAND  Final   Special Requests BOTTLES DRAWN AEROBIC AND ANAEROBIC  5CC   Final   Culture  Setup Time   Final    GRAM POSITIVE COCCI IN CLUSTERS ANAEROBIC BOTTLE ONLY CRITICAL RESULT CALLED TO, READ BACK BY AND VERIFIED WITH: A. JOHNSTON, PHARM AT [id number] ON [id number] BY S. YARBROUGH    Culture (A)  Final    STAPHYLOCOCCUS SPECIES (COAGULASE NEGATIVE) Unable to obtain susceptibility results.  Sent to [facility] for further susceptibility testing. RESULT CALLED TO, READ BACK BY AND VERIFIED WITH: D MUHORO,RN AT 0906 03/17/16 BY L BENFIELD    Report Status PENDING  Incomplete  Culture, blood (routine x 2)     Status: None   Collection Time: 03/14/2016  6:32 PM  Result Value Ref Range Status   Specimen Description BLOOD RIGHT ANTECUBITAL  Final   Special Requests IN PEDIATRIC BOTTLE [place]  Final   Culture NO GROWTH 5 DAYS  Final   Report Status 03/18/2016 FINAL  Final         Radiology Studies: No results found.      Scheduled Meds: . antiseptic oral rinse  7 mL Mouth Rinse QID  . aspirin  81 mg Oral Daily  . atorvastatin  80 mg Oral q1800  . bisacodyl  5 mg Oral BID  . chlorhexidine gluconate (SAGE KIT)  15 mL Mouth Rinse BID  . darbepoetin (ARANESP) injection - DIALYSIS  200 mcg Intravenous Q Thu-HD  . feeding  supplement (ENSURE ENLIVE)  237 mL Oral BID PC  . feeding supplement (NEPRO CARB STEADY)  237 mL Oral BID BM  . gabapentin  300 mg Oral BID  .  insulin aspart  0-9 Units Subcutaneous Q4H  . ipratropium-albuterol  3 mL Nebulization TID  . isosorbide dinitrate  10 mg Oral TID  . metoCLOPramide (REGLAN) injection  5 mg Intravenous Q6H  . metoprolol tartrate  25 mg Oral BID  . multivitamin  1 tablet Oral QHS  . pantoprazole  40 mg Oral Q1200  . polyethylene glycol  17 g Oral Daily  . senna-docusate  1 tablet Oral BID  . vancomycin  500 mg Intravenous Q T,Th,Sa-HD   Continuous Infusions:     LOS: 33 days    Time spent: 40 minutes    Lillan Mccreadie, Geraldo Docker, MD [facility] Pager [phone]   If 7PM-7AM, please contact night-coverage [email or web address] Password [email or web address] 03/20/2016, 9:15 AM

## 2016-03-20 NOTE — Progress Notes (Signed)
Pt now threatening to hit RT if she hurts him during ABG blood draw. Pt talked down and has not made a violent move at this time.

## 2016-03-20 NOTE — Progress Notes (Signed)
CSW followed up with patient to discuss rehab options now that he is decannulated- pt somewhat wary of going to a rehab center but is agreeable to CSW contacting patient sister to discuss and is agreeable to what his sister decides.  CSW spoke with pt sister Crystal who states he would need rehab because she is unable to accommodate him at home at this time (states she home schools her children)- hopeful for placement in [place]   CSW will continue to follow  Burna SisJenna H. Ramin Zoll, LCSWA Clinical Social Worker [phone]

## 2016-03-20 NOTE — Progress Notes (Signed)
Pt complaining of nausea throughout the night, some relief with zofran earlier in the night, but no relief with am dose. Pt complaining of new sharp left upper abdominal pain. Morphine given and Craige Cotta, NP [facility]) notified. Will continue to monitor.

## 2016-03-20 NOTE — Progress Notes (Signed)
Pharmacy Antibiotic Note  Tanner Campbell is a 40 y.o. male admitted on 02/23/2016 with R/O bacteremia.  Pharmacy has been consulted for Vancomycin dosing.  This pt has ESRD with TTS dialysis.  Plan: Continue Vancomycin 500mg  IV qHD, next dose 8/17 Monitor HD tolerance, micro data, and clinical status Check steady state Vanc level pre-HD if Vanc to continue Consider stopping Vanc as Coag neg staph is probably a contaminant  Height: 5\' 5"  (165.1 cm) Weight: 125 lb 10.6 oz (57 kg) IBW/kg (Calculated) : 61.5  Temp (24hrs), Avg:99.2 F (37.3 C), Min:97.9 F (36.6 C), Max:101.1 F (38.4 C)   Recent Labs Lab 03/15/16 0921 03/16/16 0757 03/16/16 2302 03/19/16 0357 03/20/16 0551  WBC 12.7* 12.9* 11.5* 16.0* 12.4*  CREATININE 3.07* 3.73* 2.13* 4.52* 2.47*  LATICACIDVEN 0.7  --   --   --   --     Estimated Creatinine Clearance: 31.7 mL/min (by C-G formula based on SCr of 2.47 mg/dL).    Allergies  Allergen Reactions  . Penicillins Other (See Comments)    Reaction: Unknown    Antimicrobials this admission: Vanc 7/2 >> 7/17, resumed 8/10 >> Cefepime 7/14 >> 7/21  Flagyl 7/17 >> 7/31  Dose adjustments this admission: 7/14 0500 VR = 18 mcg/mL 7/14 2000 VR = 21 mcg/mL  Microbiology results: 7/14 MRSA PCR - negative 7/18 BCx x2 - negative 7/30 TA - negative 7/30 BCx x2 - 1 of 2 CoNS (BCID Staph spp) 8/9 BCx x2 - 1 of 2 GPC  Thank you for allowing pharmacy to be a part of this patient's care.   Marisue HumbleKendra Cameron Katayama, PharmD Clinical Pharmacist [facility]

## 2016-03-20 NOTE — Progress Notes (Signed)
[facility] ROUNDING NOTE   Subjective:   Interval History:  Appears much brighter today   Objective:  Vital signs in last 24 hours:  Temp:  [97.9 F (36.6 C)-101.1 F (38.4 C)] 97.9 F (36.6 C) (08/16 0810) Pulse Rate:  [91-102] 91 (08/16 0600) Resp:  [6-22] 6 (08/16 0810) BP: (110-166)/(65-88) 152/80 (08/16 0810) SpO2:  [95 %-100 %] 99 % (08/16 0810) Weight:  [57 kg (125 lb 10.6 oz)-59 kg (130 lb 1.1 oz)] 57 kg (125 lb 10.6 oz) (08/16 0338)  Weight change: 2 kg (4 lb 6.6 oz) Filed Weights   03/19/16 0743 03/19/16 1155 03/20/16 0338  Weight: 61 kg (134 lb 7.7 oz) 59 kg (130 lb 1.1 oz) 57 kg (125 lb 10.6 oz)    Intake/Output: I/O last 3 completed shifts: In: 120 [P.O.:120] Out: 1500 [Other:1500]   Intake/Output this shift:  No intake/output data recorded.  CVS- RRR RS- CTA ABD- BS present soft non-distended EXT- no edemamuscle wasting, AV access LUE with thrill and echymoses   Basic Metabolic Panel:  Recent Labs Lab 03/14/16 0900 03/15/16 0921 03/16/16 0757 03/16/16 2302 03/19/16 0357 03/20/16 0551  NA 135 134* 135 135 133* 133*  K 5.1 3.9 4.1 3.1* 5.1 4.3  CL 96* 96* 95* 96* 94* 96*  CO2 _0 GLUCOSE 98 111* 143* 96 107* 125*  BUN 37* 19 33* 16 50* 29*  CREATININE 4.50* 3.07* 3.73* 2.13* 4.52* 2.47*  CALCIUM 8.7* 8.5* 8.6* 8.0* 8.4* 8.2*  MG 1.9 1.8  --   --   --  1.6*  PHOS 4.7*  --  3.8  --   --   --     Liver Function Tests:  Recent Labs Lab 03/14/16 0900 03/16/16 0757 03/16/16 2302 03/19/16 0357  AST  --   --  30 30  ALT  --   --  19 20  ALKPHOS  --   --  239* 231*  BILITOT  --   --  0.7 1.0  PROT  --   --  5.3* 5.8*  ALBUMIN 2.1* 1.9* 1.8* 1.8*   No results for input(s): LIPASE, AMYLASE in the last 168 hours. No results for input(s): AMMONIA in the last 168 hours.  CBC:  Recent Labs Lab 03/14/16 0900 03/15/16 0921 03/16/16 0757 03/16/16 2302 03/19/16 0357 03/20/16 0551  WBC 18.7* 12.7* 12.9*  11.5* 16.0* 12.4*  NEUTROABS 16.2* 10.3*  --   --   --  9.1*  HGB 9.2* 8.7* 9.1* 8.6* 9.0* 8.5*  HCT 31.3* 30.3* 30.6* 29.0* 31.2* 29.0*  MCV 97.5 98.4 95.9 97.0 96.9 96.3  PLT 212 244 268 256 302 246    Cardiac Enzymes: No results for input(s): CKTOTAL, CKMB, CKMBINDEX, TROPONINI in the last 168 hours.  BNP: Invalid input(s): POCBNP  CBG:  Recent Labs Lab 03/19/16 1648 03/19/16 2009 03/19/16 2348 03/20/16 0329 03/20/16 0807  GLUCAP 306* 255* 282* 175* 151*    Microbiology: Results for orders placed or performed during the hospital encounter of 02/10/2016  MRSA PCR Screening     Status: None   Collection Time: 02/06/2016  6:48 PM  Result Value Ref Range Status   MRSA by PCR NEGATIVE NEGATIVE Final    Comment:        The GeneXpert MRSA Assay (FDA approved for NASAL specimens only), is one component of a comprehensive MRSA colonization surveillance program. It is not intended to diagnose MRSA infection nor to guide or monitor treatment  for MRSA infections.   Culture, blood (Routine X 2) w Reflex to ID Panel     Status: None   Collection Time: 02/03/2016  9:52 AM  Result Value Ref Range Status   Specimen Description BLOOD RIGHT HAND  Final   Special Requests IN PEDIATRIC BOTTLE  3CC  Final   Culture NO GROWTH 5 DAYS  Final   Report Status 02/25/2016 FINAL  Final  Culture, blood (Routine X 2) w Reflex to ID Panel     Status: None   Collection Time: 02/08/2016  9:56 AM  Result Value Ref Range Status   Specimen Description BLOOD RIGHT HAND  Final   Special Requests IN PEDIATRIC BOTTLE  2CC  Final   Culture NO GROWTH 5 DAYS  Final   Report Status 02/25/2016 FINAL  Final  Culture, blood (routine x 2)     Status: None   Collection Time: 03/03/16  1:11 PM  Result Value Ref Range Status   Specimen Description BLOOD BLOOD RIGHT HAND  Final   Special Requests IN PEDIATRIC BOTTLE 3CC  Final   Culture NO GROWTH 5 DAYS  Final   Report Status 03/22/2016 FINAL  Final  Culture,  blood (routine x 2)     Status: Abnormal   Collection Time: 03/03/16  1:16 PM  Result Value Ref Range Status   Specimen Description BLOOD BLOOD RIGHT ARM  Final   Special Requests IN PEDIATRIC BOTTLE 3CC  Final   Culture  Setup Time   Final    GRAM POSITIVE COCCI IN CLUSTERS AEROBIC BOTTLE ONLY CRITICAL RESULT CALLED TO, READ BACK BY AND VERIFIED WITH: M TURNER 03/04/16 @ 1143 M VESTAL    Culture STAPHYLOCOCCUS SPECIES (COAGULASE NEGATIVE) (A)  Final   Report Status 03/16/2016 FINAL  Final  Blood Culture ID Panel (Reflexed)     Status: Abnormal   Collection Time: 03/03/16  1:16 PM  Result Value Ref Range Status   Enterococcus species NOT DETECTED NOT DETECTED Final   Vancomycin resistance NOT DETECTED NOT DETECTED Final   Listeria monocytogenes NOT DETECTED NOT DETECTED Final   Staphylococcus species DETECTED (A) NOT DETECTED Final    Comment: CRITICAL RESULT CALLED TO, READ BACK BY AND VERIFIED WITH: M TURNER 03/04/16 @ 1143 M VESTAL    Staphylococcus aureus NOT DETECTED NOT DETECTED Final   Methicillin resistance NOT DETECTED NOT DETECTED Final   Streptococcus species NOT DETECTED NOT DETECTED Final   Streptococcus agalactiae NOT DETECTED NOT DETECTED Final   Streptococcus pneumoniae NOT DETECTED NOT DETECTED Final   Streptococcus pyogenes NOT DETECTED NOT DETECTED Final   Acinetobacter baumannii NOT DETECTED NOT DETECTED Final   Enterobacteriaceae species NOT DETECTED NOT DETECTED Final   Enterobacter cloacae complex NOT DETECTED NOT DETECTED Final   Escherichia coli NOT DETECTED NOT DETECTED Final   Klebsiella oxytoca NOT DETECTED NOT DETECTED Final   Klebsiella pneumoniae NOT DETECTED NOT DETECTED Final   Proteus species NOT DETECTED NOT DETECTED Final   Serratia marcescens NOT DETECTED NOT DETECTED Final   Carbapenem resistance NOT DETECTED NOT DETECTED Final   Haemophilus influenzae NOT DETECTED NOT DETECTED Final   Neisseria meningitidis NOT DETECTED NOT DETECTED Final    Pseudomonas aeruginosa NOT DETECTED NOT DETECTED Final   Candida albicans NOT DETECTED NOT DETECTED Final   Candida glabrata NOT DETECTED NOT DETECTED Final   Candida krusei NOT DETECTED NOT DETECTED Final   Candida parapsilosis NOT DETECTED NOT DETECTED Final   Candida tropicalis NOT DETECTED NOT DETECTED Final    Culture, respiratory (NON-Expectorated)     Status: None   Collection Time: 03/03/16  1:19 PM  Result Value Ref Range Status   Specimen Description TRACHEAL ASPIRATE  Final   Special Requests NONE  Final   Gram Stain   Final    ABUNDANT WBC PRESENT, PREDOMINANTLY PMN RARE SQUAMOUS EPITHELIAL CELLS PRESENT FEW GRAM NEGATIVE COCCI IN PAIRS RARE GRAM NEGATIVE RODS RARE GRAM POSITIVE COCCI IN PAIRS    Culture MULTIPLE ORGANISMS PRESENT, NONE PREDOMINANT  Final   Report Status 03/05/2016 FINAL  Final  Culture, blood (routine x 2)     Status: Abnormal (Preliminary result)   Collection Time: 03/11/2016  5:00 PM  Result Value Ref Range Status   Specimen Description BLOOD RIGHT HAND  Final   Special Requests BOTTLES DRAWN AEROBIC AND ANAEROBIC  5CC   Final   Culture  Setup Time   Final    GRAM POSITIVE COCCI IN CLUSTERS ANAEROBIC BOTTLE ONLY CRITICAL RESULT CALLED TO, READ BACK BY AND VERIFIED WITH: A. JOHNSTON, PHARM AT [id number] ON [id number] BY S. YARBROUGH    Culture (A)  Final    STAPHYLOCOCCUS SPECIES (COAGULASE NEGATIVE) Unable to obtain susceptibility results.  Sent to [place] for further susceptibility testing. RESULT CALLED TO, READ BACK BY AND VERIFIED WITH: D MUHORO,RN AT 0906 03/17/16 BY L BENFIELD    Report Status PENDING  Incomplete  Culture, blood (routine x 2)     Status: None   Collection Time: 03/10/2016  6:32 PM  Result Value Ref Range Status   Specimen Description BLOOD RIGHT ANTECUBITAL  Final   Special Requests IN PEDIATRIC BOTTLE [place]  Final   Culture NO GROWTH 5 DAYS  Final   Report Status 03/18/2016 FINAL  Final    Coagulation Studies: No results for input(s):  LABPROT, INR in the last 72 hours.  Urinalysis: No results for input(s): COLORURINE, LABSPEC, PHURINE, GLUCOSEU, HGBUR, BILIRUBINUR, KETONESUR, PROTEINUR, UROBILINOGEN, NITRITE, LEUKOCYTESUR in the last 72 hours.  Invalid input(s): APPERANCEUR    Imaging: No results found.   Medications:     . antiseptic oral rinse  7 mL Mouth Rinse QID  . aspirin  81 mg Oral Daily  . atorvastatin  80 mg Oral q1800  . bisacodyl  5 mg Oral BID  . chlorhexidine gluconate (SAGE KIT)  15 mL Mouth Rinse BID  . darbepoetin (ARANESP) injection - DIALYSIS  200 mcg Intravenous Q Thu-HD  . feeding supplement (ENSURE ENLIVE)  237 mL Oral BID PC  . feeding supplement (NEPRO CARB STEADY)  237 mL Oral BID BM  . gabapentin  300 mg Oral BID  . insulin aspart  0-9 Units Subcutaneous Q4H  . ipratropium-albuterol  3 mL Nebulization TID  . isosorbide dinitrate  10 mg Oral TID  . metoCLOPramide (REGLAN) injection  5 mg Intravenous Q6H  . metoprolol tartrate  25 mg Oral BID  . multivitamin  1 tablet Oral QHS  . pantoprazole  40 mg Oral Q1200  . polyethylene glycol  17 g Oral Daily  . senna-docusate  1 tablet Oral BID  . vancomycin  500 mg Intravenous Q T,Th,Sa-HD   acetaminophen, albuterol, dextrose, diphenhydrAMINE, metoprolol, morphine injection, nitroGLYCERIN, ondansetron (ZOFRAN) IV, oxyCODONE, sennosides  Assessment/ Plan:  SUMMARY  initially was admitted to [facility] from 6/21 - 6/29 for acute respiratory failure in the setting of volume overload. He decompensated 6/23 and required intubation. The patient was found to have H. Influenza positive sputum and treated with Rocephin. CXR demonstrated a large right pleural  effusion and he underwent a thoracentesis (1L serous fluid removed - exudative by protein). He had difficulty with weaning and was transferred to [facility] on 6/29 orally intubated for ventilator weaning. The patient developed fever and antibiotics were expanded to vancomycin, cefepime and diflucan. He  had progressed to weaning on PSV x 4 hours as of 7/3. He self extubated on 7/5. Since that time he required BiPAP off and on.After HD 7/10 his mental status worsened and he became minimally responsive. CXR was consistent with RLL opacification and he was re-intubated. He was re-cultured and also found to have increased Troponin for which Cardiology was consulted. He was placed on LMWH, but had a GIB while on this w/ his hgb dropping as low as 8.4. On 7/14 he remained ventilator dependent. His sputum cultures grew MSSA and E coli and he was noted to have + blood cultures. Because of these acutely worsening issues it was felt best to transfer him to the ICU at [facility].    1. ESRD   TTS dialysis [facility]) 2. 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 GI  Stable   4, Anemia stable   Transfusion 7/24 and 7/27 5. Acute on chronic respiratory failure -- Per CCM 6. Bilateral pleural effusion 7. Bacteremia staph s/p cefipime   8. Severe malnutrition 9. Disposition  -- return to [place] unit   LOS: [address] R[address] W _0 _1 :59 AM

## 2016-03-20 NOTE — Progress Notes (Signed)
Physical Therapy Treatment Patient Details Name: Tanner AdieJerome Pfeifle MRN: [id number] DOB: [birth year] Today's Date: 03/20/2016    History of Present Illness Tanner Campbell is an 40 y.o. 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 He was intubated 6/23-7/5 (self extubated), 7/10-7/16 (self extubated), was then reintubated. Initially, sputum was positive for H influenza and subsequently for MSSA and Escherichia coli and blood cultures growing a gram-negative anaerobic organism. Thoracentesis on right showed borderline exudate by LDH criteria. 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  Fell OOB while sleeping 03/18/16.    PT Comments    Mr. Curb agreeable to therapy.  He required mod +2 assist to boost up to standing from bed and from stedy.  Pt c/o sharp severe Bil calf pain with WBing activity, see general comments below for more detail.  RN notified of concern for possible DVT. Will continue to follow acutely.   Follow Up Recommendations  Supervision/Assistance - 24 hour;SNF     Equipment Recommendations  Other (comment) (TBA)    Recommendations for Other Services       Precautions / Restrictions Precautions Precautions: Fall Precaution Comments: Dys 2 with nectar thick liquids Restrictions Weight Bearing Restrictions: No    Mobility  Bed Mobility               General bed mobility comments: Pt sitting EOB upon PT arrival  Transfers Overall transfer level: Needs assistance Equipment used: Ambulation equipment used Transfers: Sit to/from Stand Sit to Stand: Mod assist;+2 physical assistance;+2 safety/equipment         General transfer comment: Pt requires mod A +2 to lift buttocks from bed and assist and increased time to extend hips and knees.   Stood from bed x1 and from [employer] x1.  Pt c/o sharp pain in Rt calf>Lt with WBing, shouting out.  Ambulation/Gait                 Stairs            Wheelchair Mobility    Modified Rankin (Stroke Patients Only)       Balance Overall balance assessment: Needs assistance Sitting-balance support: No upper extremity supported;Feet supported Sitting balance-Leahy Scale: Good     Standing balance support: Bilateral upper extremity supported;During functional activity Standing balance-Leahy Scale: Poor                      Cognition Arousal/Alertness: Awake/alert Behavior During Therapy: Flat affect Overall Cognitive Status: Impaired/Different from baseline Area of Impairment: Problem solving             Problem Solving: Slow processing;Difficulty sequencing;Requires verbal cues      Exercises General Exercises - Lower Extremity Ankle Circles/Pumps: AROM;Both;10 reps;Seated (prior to pt c/o pain)    General Comments General comments (skin integrity, edema, etc.): Pt shouts out and c/o sharp pain in Rt calf>Lt with WBing activity.  Very sensative to light squeeze to calf Bil or to passive DF.  Neither calf is warm or red. No SOB. RN notified of concern for possible DVT.      Pertinent Vitals/Pain Pain Assessment: Faces Faces Pain Scale: Hurts whole lot Pain Location: Bil calf  Pain Descriptors / Indicators: Sharp;Grimacing;Guarding Pain Intervention(s): Limited activity within patient's tolerance;Monitored during  session;Repositioned (notified RN)    Home Living                      Prior Function            PT Goals (current goals can now be found in the care plan section) Acute Rehab PT Goals Patient Stated Goal: to get stronger PT Goal Formulation: With patient Time For Goal Achievement: 03/28/16 Potential to Achieve Goals: Good Progress towards PT goals: Not progressing toward goals - comment (limited by pain this session)     Frequency  Min 3X/week    PT Plan Current plan remains appropriate    Co-evaluation             End of Session Equipment Utilized During Treatment: Oxygen;Gait belt Activity Tolerance: Patient limited by pain Patient left: with call bell/phone within reach;Other (comment);in bed;with bed alarm set (sitting EOB)     Time: 1340-1409 PT Time Calculation (min) (ACUTE ONLY): 29 min  Charges:  $Therapeutic Activity: 23-37 mins                    G Codes:       Encarnacion Chu PT, DPT  Pager: [phone] Phone: [phone] 03/20/2016, 4:02 PM
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
## 2016-03-20 NOTE — Progress Notes (Signed)
Patient sat out of bed to reclinig chairfor 1.5 hrs and refused to sit any longer. He is c/o of chair being uncomfortable despite having sit cushion in place.
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
## 2016-03-20 NOTE — Progress Notes (Signed)
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 Craige Cotta, NP notified and RN advised to place 4x4 gauze and wrap with coban. Site wrapped per verbal order. Will continue to monitor.

## 2016-03-21 DIAGNOSIS — R55 Syncope and collapse: Secondary | ICD-10-CM

## 2016-03-21 LAB — BASIC METABOLIC PANEL
ANION GAP: 12 (ref 5–15)
BUN: 50 mg/dL — ABNORMAL HIGH (ref 6–20)
CHLORIDE: 89 mmol/L — AB (ref 101–111)
CO2: 30 mmol/L (ref 22–32)
Calcium: 8.2 mg/dL — ABNORMAL LOW (ref 8.9–10.3)
Creatinine, Ser: 3.61 mg/dL — ABNORMAL HIGH (ref 0.61–1.24)
GFR calc Af Amer: 23 mL/min — ABNORMAL LOW (ref >60)
GFR, EST NON AFRICAN AMERICAN: 19 mL/min — AB (ref >60)
GLUCOSE: 203 mg/dL — AB (ref 65–99)
POTASSIUM: 6.1 mmol/L — AB (ref 3.5–5.1)
Sodium: 131 mmol/L — ABNORMAL LOW (ref 135–145)

## 2016-03-21 LAB — CBC WITH DIFFERENTIAL/PLATELET
BASOS PCT: 0 %
Basophils Absolute: 0 10*3/uL (ref 0.0–0.1)
EOS PCT: 0 %
Eosinophils Absolute: 0 10*3/uL (ref 0.0–0.7)
HEMATOCRIT: 29.9 % — AB (ref 39.0–52.0)
HEMOGLOBIN: 8.7 g/dL — AB (ref 13.0–17.0)
Lymphocytes Relative: 4 %
Lymphs Abs: 0.9 10*3/uL (ref 0.7–4.0)
MCH: 27.7 pg (ref 26.0–34.0)
MCHC: 29.1 g/dL — ABNORMAL LOW (ref 30.0–36.0)
MCV: 95.2 fL (ref 78.0–100.0)
MONOS PCT: 6 %
Monocytes Absolute: 1.3 10*3/uL — ABNORMAL HIGH (ref 0.1–1.0)
NEUTROS PCT: 90 %
Neutro Abs: 20 10*3/uL — ABNORMAL HIGH (ref 1.7–7.7)
Platelets: 295 10*3/uL (ref 150–400)
RBC: 3.14 MIL/uL — AB (ref 4.22–5.81)
RDW: 18.4 % — ABNORMAL HIGH (ref 11.5–15.5)
WBC: 22.2 10*3/uL — AB (ref 4.0–10.5)

## 2016-03-21 LAB — GLUCOSE, CAPILLARY
GLUCOSE-CAPILLARY: 205 mg/dL — AB (ref 65–99)
GLUCOSE-CAPILLARY: 208 mg/dL — AB (ref 65–99)
Glucose-Capillary: 197 mg/dL — ABNORMAL HIGH (ref 65–99)
Glucose-Capillary: 305 mg/dL — ABNORMAL HIGH (ref 65–99)
Glucose-Capillary: 91 mg/dL (ref 65–99)
Glucose-Capillary: 116 mg/dL — ABNORMAL HIGH (ref 65–99)

## 2016-03-21 LAB — MAGNESIUM: Magnesium: 1.5 mg/dL — ABNORMAL LOW (ref 1.7–2.4)

## 2016-03-21 MED ORDER — MORPHINE SULFATE (PF) 2 MG/ML IV SOLN
INTRAVENOUS | Status: AC
  Administered 2016-03-21: 2 mg via INTRAVENOUS
  Filled 2016-03-21: qty 1

## 2016-03-21 MED ORDER — DARBEPOETIN ALFA 200 MCG/0.4ML IJ SOSY
PREFILLED_SYRINGE | INTRAMUSCULAR | Status: AC
  Filled 2016-03-21: qty 0.4

## 2016-03-21 NOTE — Progress Notes (Signed)
03/21/16 0330  What Happened  Was fall witnessed? No  Was patient injured? No  Patient found on floor (kneeling by side of bed)  Found by Staff-comment Joyce Gross(Kay, RN)  Stated prior activity other (comment) (pt states he knelt by side of bed d/t nausea/abdominal pain)  Follow Up  MD notified Craige CottaKirby, NP  Time MD notified [id number]  Family notified No- patient refusal (pt states he got onto ground purposefully, pt a&ox4)  Progress note created (see row info) Yes  Adult Fall Risk Assessment  Risk Factor Category (scoring not indicated) Fall has occurred during this admission (document High fall risk)  Patient's Fall Risk High Fall Risk (>13 points)  Adult Fall Risk Interventions  Required Bundle Interventions *See Row Information* High fall risk - low, moderate, and high requirements implemented  Additional Interventions Individualized elimination schedule;Use of appropriate toileting equipment (bedpan, BSC, etc.);Room near nurses station  Screening for Fall Injury Risk  Risk For Fall Injury- See Row Information  Nurse judgement  Injury Prevention Interventions Specialty Low Bed  Vitals  Temp 99.7 F (37.6 C)  Temp Source Oral  BP 129/64  MAP (mmHg) 78  BP Location Right Arm  BP Method Automatic  Patient Position (if appropriate) Lying  Pulse Rate Source Monitor  ECG Heart Rate (!) 109  Cardiac Rhythm ST  Oxygen Therapy  SpO2 99 %  O2 Device Nasal Cannula  O2 Flow Rate (L/min) 5 L/min  Pain Assessment  Pain Assessment 0-10  Pain Score 10  Pain Type Acute pain  Pain Location Abdomen  Pain Orientation Left  Pain Descriptors / Indicators Sharp  Pain Intervention(s) Medication (See eMAR)  PCA/Epidural/Spinal Assessment  Respiratory Pattern Regular  Neurological  Level of Consciousness Alert  Orientation Level Oriented X4  Cognition Appropriate attention/concentration;Appropriate judgement;Appropriate safety awareness;Follows commands  Speech Clear;Other (Comment) (difficulty  speaking, places hand over stoma to speak clearly)  Pupil Assessment  No  R Pupil Size (mm) 3  R Pupil Shape Oval  R Pupil Reaction Sluggish  L Pupil Size (mm) 3  L Pupil Shape Oval  L Pupil Reaction Sluggish  Additional Pupil Assessments No  Facial Symmetry Symmetrical  R Hand Grip Present  L Hand Grip Present  RUE Motor Response Purposeful movement  RUE Motor Strength 4  LUE Motor Response Purposeful movement  LUE Motor Strength 4  RLE Motor Response Purposeful movement  RLE Motor Strength 3  LLE Motor Response Purposeful movement  LLE Motor Strength 3  Neuro Symptoms None  Glasgow Coma Scale  Eye Opening 4  Best Verbal Response (NON-intubated) 5  Best Motor Response 6  Glasgow Coma Scale Score 15  Musculoskeletal  Musculoskeletal (WDL) X  Generalized Weakness Yes  Weight Bearing Restrictions No  Integumentary  Integumentary (WDL) X  Skin Color Pale  Skin Condition Dry  Skin Integrity Ecchymosis (Where fistula is located)  Ecchymosis Location Leg  Ecchymosis Location Orientation Left;Right  Petechiae Location Orientation Right;Left  Skin Turgor Non-tenting
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
## 2016-03-21 NOTE — Progress Notes (Signed)
Patient was dangling on side of bed with assist when he asked to use the bedpan. RN assisted patient back into bed and bedpan placed under patient. Call bell within reach, specialty low bed placed in lowest position, bed alarm on, yellow fall risk socks in place, 3 side rails up. Patient was asked to call when finished. RN left the room to care for another patient. At [place], RN arrived to patient's room after hearing the bed alarm and found patient kneeling by side of bed, facing the bed, with forearms/upper body resting on the bed. Joyce Gross, RN notified this RN of the findings and this RN arrived to room immediately. Patient found in the same position as mentioned above. Patient assisted back to bed by this RN; Joyce Gross, RN; and Len Childs, RN. Patient states he knelt on the ground purposefully because of his ongoing abdominal pain and nausea. Patient had previously been medicated (approximately 2 hours before) for these complaints with morphine and Zofran, and stated he felt "a little better" after the intervention. Patient was alert and oriented on assessment and complains of no other injuries or pain post-fall. Craige Cotta, NP notified of the fall and findings.

## 2016-03-21 NOTE — Progress Notes (Signed)
Pt to HD via recliner.  VSS

## 2016-03-21 NOTE — Progress Notes (Signed)
PROGRESS NOTE    Tanner Campbell  ZOX:[id number] DOB: 1976-01-09 DOA: 02/10/2016 PCP: No primary care provider on file.   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  He decompensated 6/23 and required intubation. The patient was found to have H. Influenza positive sputum and treated with Rocephin. CXR demonstrated a large right pleural effusion and he underwent a thoracentesis (1L serous fluid removed - exudative by protein). He had difficulty with weaning and was transferred to [facility] on 6/29 orally intubated for ventilator weaning. The patient developed fever and antibiotics were expanded to vancomycin, cefepime and diflucan. He had progressed to weaning on PSV x 4 hours as of 7/3. He self extubated on 7/5. Since that time he required BiPAP off and on. After HD 7/10 his mental status worsened and he became minimally responsive. CXR was consistent with RLL opacification and he was re-intubated. He was re-cultured and also found to have increased Troponin for which Cardiology was consulted. He was placed on LMWH, but had a GIB while on this w/ his hgb dropping as low as 8.4.  On 7/14 he remained ventilator dependent. His sputum cultures grew MSSA and E coli and he was noted to have + blood cultures. Because of these acutely worsening issues it was felt best to transfer him to the ICU at [facility].   Subjective: 8/17 A/O 4, afebrile last 24 States wants to sit the entire HD session in a chair today. States positive postprandial N/V, negative CP, negative SOB today.    Assessment & Plan:   Principal Problem:   Acute respiratory failure with hypoxia (HCC) Active Problems:   Sepsis ([place])   ESRD (end stage renal disease) (HCC)   Protein-calorie malnutrition, severe   Acute on chronic systolic CHF (congestive heart  failure), NYHA class 3 (HCC)   NSTEMI (non-ST elevated myocardial infarction) ([place])   Bacteremia   Tracheostomy status (HCC)   End-stage renal disease on hemodialysis ([place])   Intestinal infection due to veillonella   Pericardial effusion   Anemia, chronic disease   Acute blood loss anemia   Severe malnutrition (HCC)   HCAP (healthcare-associated pneumonia)   Aortic regurgitation   Dysphagia   Chronic back pain   Respiratory failure ([place])   Encounter for feeding tube placement   COPD exacerbation ([place])   Uncontrolled type 2 diabetes mellitus with complication ([place])   Chronic cerebrovascular accident (CVA) ([place])   Syncopal episodes    Acute hypoxic respiratory failure - s/p trach 7/18 - HCAP - Completed a course of Cefepime  -DuoNeb QID -Patient decannulated, doing well  Tracheostomy -See respiratory failure  Bacteremia with positive Veillonella species (Gram negative rod) -Completed Flagyl x 2 weeks for bacteremia as per ID recs   positive coag negative staph (normally considered contaminant)/Leukocytosis -Completed 6 days antibiotics, afebrile although increased WBC: DC and monitor.  Bilateral pleural effusions s/p thoracentesis -See acute hypoxic respiratory failure  NSTEMI vs demand ischemia -Strict in and out since admission + 3.8 L (question accuracy) -Daily weight Filed Weights   03/19/16 1155 03/20/16 0338 03/21/16 0411  Weight: 59 kg (130 lb 1.1 oz) 57 kg (125 lb 10.6 oz) 58 kg (127 lb 13.9 oz)  -Manage fluids per HD;See pleural effusion  -Transfuse for hemoglobin<8  Recent Labs Lab 03/16/16 0757  03/16/16 2302 03/19/16 0357 03/20/16 0551 03/21/16 0409  HGB 9.1* 8.6* 9.0* 8.5* 8.7*  -8/7 [facility] Cleared patient for catheterization -8/15 Dr. Sherren Mocha Cardiology Recommendation:, I do not think further invasive cardiac evaluation will impact this patient's outcome in a positive way.No cardiac contraindication for him to transfer out to a rehab  facility, but recommends local facility.  Pericardial effusion -On previous echocardiogram was small but patient now has a significant rub. 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, Th, S) -Nephrology following  -On 8/17 patient to take HD in an HD chair: Patient understands must be able to sit in chair for 4 hours for HD as outpatient  Recent GIB on LMWH -No evidence of acute blood loss a this time,  Anemia of chronic kidney disease + acute blood loss  No evidence of blood loss, but Hgb drifted down to 7.1  - 7/24 transfuse 1U PRBC  -7/27 transfused 1 unit PRBC  DM type II uncontrolled with complications  -3/71 Hemoglobin A1c= 7.7 -Sensitive SSI  Acute Encephalopathy in setting of critical illness -Resolved  Chronic CVA   Severe malnutrition in context of acute illness -See dysphagia -Ensure TID  Dysphagia -MBS 8/13; Dysphagia 2 nectar thick.  Chronic back pain -Morphine -Neurontin BID -Air mattress, ensure on rotation at all times    Goals of care -PT recommended CIR  - CIR felt he is more appropriate for LTACH - sister does not want him to go back to [facility] because she didn't feel he was being treated well - plan is for SNF placement, but will have to be decannulated as he is ESRD and requires HD and there are no area SNFs that accept ESRD AND trach patients   -Consult psychiatry on 7/29 to ensure patient has competency. Patient's family unrealistic in their expectations therefore would like patient to make decisions if deemed competent -8/15 barriers to discharge ( patient/sister/brother-in-law) 1. Patient must be able to sit in HD chair for 4 hours at next session.  NOTE: Cardiology has cleared patient No cardiac catheterization but concur that patient should remaining area for initial rehabilitation.    DVT prophylaxis: Subcutaneous heparin Code Status: Full Family Communication: Spoke with sister and brother-in-law Disposition Plan:  SNF   Consultants:  PCCM Dr. Lyman Bishop Cardiology  ID Nephrology  Dr.Su [facility] ENT   Procedures/Significant Events:  7/21 TEE;- No evidence of endocarditis.-Negative vegetation- Aortic insufficiency is seen, with a central jet. It   is likely due to degenerative changes.   7/24 transfuse 1U PRBC 7/27 PCXR: Cardiomegaly with bilateral from interstitial prominence and bilateral effusions C/W CHF, PNA cannot be excluded.- basilar atelectasis  7/27 transfused 1 unit PRBC 8/2 nuclear medicine myocardial perfusion scan:-no ST segment deviation noted during stress. -Defect 1: medium defect of severe severity present in the basal inferior and mid inferior location.C/W  with prior myocardial infarction.-LVEF=(30-44%).,Global hypokinesis worse in the inferior and inferoseptal regions. -Reversible defect in the inferior and inferoseptal region corresponding to wall motion abnormality seen on echo 8/13 CT head W Wo contrast:-No acute infarct- Small chronic infarcts at the right frontal and parietal lobes MBS 8/13: Passed   Cultures 7/14 MRSA by PCR negative 7/18 blood right hand x2 pending 7/30 blood right hand NGTD 7/30 blood right arm positive coag negative staph(most likely contaminant) 7/30 trach aspirate positive multiple organisms none predominant 8/9 blood right hand positive coag negative staph (normally considered contaminant) 8/9 blood right AC NGTD   Antimicrobials: Cipro 7/12>7/14 Vanc 7/2>7/17  Cefepime 7/14>7/21 Fluconazole 7/7>7/17 Flagyl 7/17 > 8/1 Vancomycin 8/10>> 8/17  Devices 7/25 uncuffed #6 [place]>>   LINES / TUBES:  7/29 CorTrak tube placed    Continuous Infusions:         Objective: Vitals:   03/21/16 1600 03/21/16 1630 03/21/16 1725 03/21/16 1817  BP: 131/67 134/74 (!) 143/80 137/68  Pulse: 96 97 90 (!) 107  Resp:   14 16  Temp:   97 F (36.1 C) 99.3 F (37.4 C)  TempSrc:   Oral Oral  SpO2:   97%   Weight:      Height:         Intake/Output Summary (Last 24 hours) at 03/21/16 1822 Last data filed at 03/21/16 1725  Gross per 24 hour  Intake              370 ml  Output             2335 ml  Net            -1965 ml   Filed Weights   03/19/16 1155 03/20/16 0338 03/21/16 0411  Weight: 59 kg (130 lb 1.1 oz) 57 kg (125 lb 10.6 oz) 58 kg (127 lb 13.9 oz)    Examination:  General: A/O 4 , Laying in bed follows commands,Negative acute respiratory distress. Very frustrated overall medical condition.  Eyes: negative scleral hemorrhage, negative anisocoria, negative icterus ENT: Negative Runny nose, negative gingival bleeding, Neck:  Negative scars, masses, torticollis, lymphadenopathy, JVD Lungs: clear to auscultation bilateral, positive by basilar crackles, negative wheezes Cardiovascular:  Regular rhythm and rate without murmur gallop, loud rub appreciated today, normal S1 and S2 Abdomen: negative abdominal pain, nondistended, positive soft, bowel sounds, no rebound, no ascites, no appreciable mass Extremities: No significant cyanosis, clubbing, or edema bilateral lower extremities Skin: Negative rashes, lesions, ulcers Psychiatric:  Unable to evaluate  Central nervous system:  Cranial nerves II through XII intact, follows commands moves extremities   .     Data Reviewed: Care during the described time interval was provided by me .  I have reviewed this patient's available data, including medical history, events of note, physical examination, and all test results as part of my evaluation. I have personally reviewed and interpreted all radiology studies.  CBC:  Recent Labs Lab 03/15/16 0921 03/16/16 0757 03/16/16 2302 03/19/16 0357 03/20/16 0551 03/21/16 0409  WBC 12.7* 12.9* 11.5* 16.0* 12.4* 22.2*  NEUTROABS 10.3*  --   --   --  9.1* 20.0*  HGB 8.7* 9.1* 8.6* 9.0* 8.5* 8.7*  HCT 30.3* 30.6* 29.0* 31.2* 29.0* 29.9*  MCV 98.4 95.9 97.0 96.9 96.3 95.2  PLT 244 268 256 302 246 119   Basic  Metabolic Panel:  Recent Labs Lab 03/15/16 0921 03/16/16 0757 03/16/16 2302 03/19/16 0357 03/20/16 0551 03/21/16 0409  NA 134* 135 135 133* 133* 131*  K 3.9 4.1 3.1* 5.1 4.3 6.1*  CL 96* 95* 96* 94* 96* 89*  CO2 29 26 30 28 29 30   GLUCOSE 111* 143* 96 107* 125* 203*  BUN 19 33* 16 50* 29* 50*  CREATININE 3.07* 3.73* 2.13* 4.52* 2.47* 3.61*  CALCIUM 8.5* 8.6* 8.0* 8.4* 8.2* 8.2*  MG 1.8  --   --   --  1.6* 1.5*  PHOS  --  3.8  --   --   --   --    GFR: Estimated Creatinine Clearance: 22.1 mL/min (by C-G formula based on SCr of 3.61 mg/dL). Liver Function Tests:  Recent Labs Lab 03/16/16 0757 03/16/16 2302 03/19/16 0357  AST  --  30 30  ALT  --  19 20  ALKPHOS  --  239* 231*  BILITOT  --  0.7 1.0  PROT  --  5.3* 5.8*  ALBUMIN 1.9* 1.8* 1.8*   No results for input(s): LIPASE, AMYLASE in the last 168 hours. No results for input(s): AMMONIA in the last 168 hours. Coagulation Profile: No results for input(s): INR, PROTIME in the last 168 hours. Cardiac Enzymes: No results for input(s): CKTOTAL, CKMB, CKMBINDEX, TROPONINI in the last 168 hours. BNP (last 3 results) No results for input(s): PROBNP in the last 8760 hours. HbA1C: No results for input(s): HGBA1C in the last 72 hours. CBG:  Recent Labs Lab 03/20/16 2332 03/21/16 0409 03/21/16 0755 03/21/16 1216 03/21/16 1815  GLUCAP 145* 197* 205* 305* 91   Lipid Profile: No results for input(s): CHOL, HDL, LDLCALC, TRIG, CHOLHDL, LDLDIRECT in the last 72 hours. Thyroid Function Tests: No results for input(s): TSH, T4TOTAL, FREET4, T3FREE, THYROIDAB in the last 72 hours. Anemia Panel: No results for input(s): VITAMINB12, FOLATE, FERRITIN, TIBC, IRON, RETICCTPCT in the last 72 hours. Urine analysis:    Component Value Date/Time   COLORURINE RED (A) 02/04/2016 1426   APPEARANCEUR CLOUDY (A) 02/04/2016 1426   LABSPEC 1.025 02/04/2016 1426   PHURINE 7.5 02/04/2016 1426   GLUCOSEU 500 (A) 02/04/2016 1426   HGBUR  SMALL (A) 02/04/2016 1426   BILIRUBINUR SMALL (A) 02/04/2016 1426   KETONESUR NEGATIVE 02/04/2016 1426   PROTEINUR >300 (A) 02/04/2016 1426   NITRITE NEGATIVE 02/04/2016 1426   LEUKOCYTESUR TRACE (A) 02/04/2016 1426   Sepsis Labs: @LABRCNTIP (procalcitonin:4,lacticidven:4)  ) Recent Results (from the past 240 hour(s))  Culture, blood (routine x 2)     Status: Abnormal (Preliminary result)   Collection Time: 03/20/2016  5:00 PM  Result Value Ref Range Status   Specimen Description BLOOD RIGHT HAND  Final   Special Requests BOTTLES DRAWN AEROBIC AND ANAEROBIC  5CC   Final   Culture  Setup Time   Final    GRAM POSITIVE COCCI IN CLUSTERS ANAEROBIC BOTTLE ONLY CRITICAL RESULT CALLED TO, READ BACK BY AND VERIFIED WITH: A. JOHNSTON, PHARM AT [id number] ON [id number] BY S. YARBROUGH    Culture (A)  Final    STAPHYLOCOCCUS SPECIES (COAGULASE NEGATIVE) Unable to obtain susceptibility results.  Sent to [employer] for further susceptibility testing. RESULT CALLED TO, READ BACK BY AND VERIFIED WITH: D MUHORO,RN AT 0906 03/17/16 BY L BENFIELD    Report Status PENDING  Incomplete  Susceptibility, Aer + Anaerob     Status: None   Collection Time: 03/24/2016  5:00 PM  Result Value Ref Range Status   Suscept, Aer + Anaerob Preliminary report  Final    Comment: (NOTE) Performed At: [facility] [place], [place] [id number] Lindon Romp MD KX:[id number]    Source of Sample BLOOD  Final    Comment: COAGULASE NEGATIVE STAPH FOR SENSITIVITY TESTING  Susceptibility Result     Status: Abnormal (Preliminary result)   Collection Time: 03/10/2016  5:00 PM  Result Value Ref Range Status   Suscept Result 1 Comment (A)  Final    Comment: (NOTE) Coagulase negative Staphylococcus species. Identification performed by account, not confirmed by this laboratory. Performed At: [facility] [place], [place] [id number] Lindon Romp MD OF:[id number]    Antimicrobial Suscept  PENDING  Incomplete  Culture, blood (routine x 2)  Status: None   Collection Time: 03/28/2016  6:32 PM  Result Value Ref Range Status   Specimen Description BLOOD RIGHT ANTECUBITAL  Final   Special Requests IN PEDIATRIC BOTTLE [place]  Final   Culture NO GROWTH 5 DAYS  Final   Report Status 03/18/2016 FINAL  Final         Radiology Studies: No results found.      Scheduled Meds: . antiseptic oral rinse  7 mL Mouth Rinse QID  . aspirin  81 mg Oral Daily  . atorvastatin  80 mg Oral q1800  . bisacodyl  5 mg Oral BID  . chlorhexidine gluconate (SAGE KIT)  15 mL Mouth Rinse BID  . darbepoetin (ARANESP) injection - DIALYSIS  200 mcg Intravenous Q Thu-HD  . feeding supplement (ENSURE ENLIVE)  237 mL Oral BID PC  . feeding supplement (NEPRO CARB STEADY)  237 mL Oral BID BM  . gabapentin  300 mg Oral BID  . insulin aspart  0-9 Units Subcutaneous Q4H  . ipratropium-albuterol  3 mL Nebulization TID  . isosorbide dinitrate  10 mg Oral TID  . metoCLOPramide (REGLAN) injection  5 mg Intravenous Q6H  . metoprolol tartrate  25 mg Oral BID  . multivitamin  1 tablet Oral QHS  . pantoprazole  40 mg Oral Q1200  . polyethylene glycol  17 g Oral Daily  . senna-docusate  1 tablet Oral BID   Continuous Infusions:     LOS: 34 days    Time spent: 40 minutes    Kambrey Hagger, Geraldo Docker, MD [facility] Pager [phone]   If 7PM-7AM, please contact night-coverage [email or web address] Password [facility] 03/21/2016, 6:22 PM

## 2016-03-21 NOTE — Progress Notes (Signed)
Speech Language Pathology Treatment: Dysphagia  Patient Details Name: Tanner Campbell MRN: [id number] DOB: [birth year] Today's Date: 03/21/2016 Time: 1150-1209 SLP Time Calculation (min) (ACUTE ONLY): 19 min  Assessment / Plan / Recommendation Clinical Impression  Pt with persisting dysphagia and high risk of aspiration of thin liquids.  Battling nausea today.  Instructed in execution of Masako maneuver for base of tongue strengthening -mod cues for success.  Removed ice from thickened drink to prevent thinning of liquid.  Stoma still patent with air escaping despite gauze and length of time since decannulation.  Pt with min overall cues needed to carry-out basic precautions for safety with POs.  Slow but functional progress.  Continue SLP services.    HPI HPI: 40 year old male admitted 02/29/2016 due to recurrent sepsis, failure to wean, multiple intubations. PMH significant forDM2, HTN, HLD, ESRD on HD, COPD, and ACDF C5-6 2009. Trach placed 02/19/2016.  MBS 8/2 silent aspiration thins and nectar; started on Dys 2 diet with nectar thick liquids with strict use of chin tuck for airway protection.  Self decannulated 03/14/16.  Repeat MBS 8/13 with continued moderate dysphagia, silent aspiration of thin liquids.  Pt continues on dys 2, nectars.       SLP Plan  Continue with current plan of care     Recommendations  Diet recommendations: Dysphagia 2 (fine chop);Nectar-thick liquid Liquids provided via: Cup Medication Administration: Crushed with puree Supervision: Patient able to self feed;Full supervision/cueing for compensatory strategies Compensations: Slow rate;Small sips/bites;Multiple dry swallows after each bite/sip Postural Changes and/or Swallow Maneuvers: Seated upright 90 degrees;Upright 30-60 min after meal;Chin tuck             Plan: Continue with current plan of care     GO                Blenda Mounts Laurice 03/21/2016, 12:13 PM

## 2016-03-21 NOTE — Progress Notes (Signed)
Inpatient Diabetes Program Recommendations  AACE/ADA: New Consensus Statement on Inpatient Glycemic Control (2015)  Target Ranges:  Prepandial:   less than 140 mg/dL      Peak postprandial:   less than 180 mg/dL (1-2 hours)      Critically ill patients:  140 - 180 mg/dL   Lab Results  Component Value Date   GLUCAP 305 (H) 03/21/2016   HGBA1C 7.7 (H) 02/02/2016    Review of Glycemic Control    Inpatient Diabetes Program Recommendations:    Consider addition of Novolog 2 units tidwc for meal coverage insulin.  Will follow. Thank you. Ailene Ards, RD, LDN, CDE Inpatient Diabetes Coordinator [phone]

## 2016-03-21 NOTE — Care Management Important Message (Signed)
Important Message  Patient Details  Name: Tanner Campbell MRN: [id number] Date of Birth: [birth year]   Medicare Important Message Given:  Yes    [name] Abena 03/21/2016, 11:45 AM

## 2016-03-21 NOTE — Progress Notes (Signed)
[facility] when ready for DC- left message for pt sister to inform  Per MD plan for transfer to SNF early next week as long as pt is able to sit for dialysis  CSW will continue to follow  Burna SisJenna H. Alain Deschene, LCSWA Clinical Social Worker [phone]

## 2016-03-21 NOTE — Procedures (Signed)
I have seen and examined this patient and agree with the plan of care   Patient seen on dialysis with no complaints  K 6.1     Ca 8.2   Hb 8.7   Worthington Cruzan W 03/21/2016, 11:56 AM

## 2016-03-22 ENCOUNTER — Other Ambulatory Visit (HOSPITAL_COMMUNITY): Payer: Medicare Other

## 2016-03-22 LAB — CBC WITH DIFFERENTIAL/PLATELET
Basophils Absolute: 0 10*3/uL (ref 0.0–0.1)
Basophils Relative: 0 %
Eosinophils Absolute: 0 10*3/uL (ref 0.0–0.7)
Eosinophils Relative: 0 %
HCT: 29.7 % — ABNORMAL LOW (ref 39.0–52.0)
Hemoglobin: 8.5 g/dL — ABNORMAL LOW (ref 13.0–17.0)
Lymphocytes Relative: 2 %
Lymphs Abs: 0.5 10*3/uL — ABNORMAL LOW (ref 0.7–4.0)
MCH: 27.9 pg (ref 26.0–34.0)
MCHC: 28.6 g/dL — ABNORMAL LOW (ref 30.0–36.0)
MCV: 97.4 fL (ref 78.0–100.0)
Monocytes Absolute: 2 10*3/uL — ABNORMAL HIGH (ref 0.1–1.0)
Monocytes Relative: 9 %
Neutro Abs: 21 10*3/uL — ABNORMAL HIGH (ref 1.7–7.7)
Neutrophils Relative %: 89 %
Platelets: 267 10*3/uL (ref 150–400)
RBC: 3.05 MIL/uL — ABNORMAL LOW (ref 4.22–5.81)
RDW: 18.5 % — ABNORMAL HIGH (ref 11.5–15.5)
WBC: 23.5 10*3/uL — ABNORMAL HIGH (ref 4.0–10.5)

## 2016-03-22 LAB — BASIC METABOLIC PANEL
Anion gap: 13 (ref 5–15)
BUN: 25 mg/dL — ABNORMAL HIGH (ref 6–20)
CO2: 29 mmol/L (ref 22–32)
Calcium: 8.2 mg/dL — ABNORMAL LOW (ref 8.9–10.3)
Chloride: 91 mmol/L — ABNORMAL LOW (ref 101–111)
Creatinine, Ser: 2.3 mg/dL — ABNORMAL HIGH (ref 0.61–1.24)
GFR calc Af Amer: 39 mL/min — ABNORMAL LOW (ref >60)
GFR calc non Af Amer: 34 mL/min — ABNORMAL LOW (ref >60)
Glucose, Bld: 165 mg/dL — ABNORMAL HIGH (ref 65–99)
Potassium: 5 mmol/L (ref 3.5–5.1)
Sodium: 133 mmol/L — ABNORMAL LOW (ref 135–145)

## 2016-03-22 LAB — GLUCOSE, CAPILLARY
GLUCOSE-CAPILLARY: 158 mg/dL — AB (ref 65–99)
GLUCOSE-CAPILLARY: 258 mg/dL — AB (ref 65–99)
Glucose-Capillary: 193 mg/dL — ABNORMAL HIGH (ref 65–99)
Glucose-Capillary: 176 mg/dL — ABNORMAL HIGH (ref 65–99)
Glucose-Capillary: 224 mg/dL — ABNORMAL HIGH (ref 65–99)

## 2016-03-22 LAB — MAGNESIUM: Magnesium: 1.6 mg/dL — ABNORMAL LOW (ref 1.7–2.4)

## 2016-03-22 MED ORDER — HEPARIN SODIUM (PORCINE) 5000 UNIT/ML IJ SOLN
5000.0000 [IU] | Freq: Three times a day (TID) | INTRAMUSCULAR | Status: DC
  Administered 2016-03-22: 5000 [IU] via SUBCUTANEOUS
  Filled 2016-03-22: qty 1

## 2016-03-22 MED ORDER — GI COCKTAIL ~~LOC~~
30.0000 mL | Freq: Once | ORAL | Status: AC
  Administered 2016-03-22: 30 mL via ORAL
  Filled 2016-03-22: qty 30

## 2016-03-22 MED ORDER — INSULIN ASPART 100 UNIT/ML ~~LOC~~ SOLN: 0.0000 [IU] | Freq: Three times a day (TID) | SUBCUTANEOUS | Status: DC

## 2016-03-22 MED ORDER — METOCLOPRAMIDE HCL 5 MG PO TABS: 5.0000 mg | ORAL_TABLET | Freq: Three times a day (TID) | ORAL | Status: DC

## 2016-03-22 MED ORDER — LORAZEPAM 2 MG/ML IJ SOLN
1.0000 mg | Freq: Once | INTRAMUSCULAR | Status: AC
  Administered 2016-03-22: 1 mg via INTRAVENOUS
  Filled 2016-03-22: qty 1
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
## 2016-03-22 NOTE — Progress Notes (Signed)
Pt transferred to 6E15 for camera surveillance. Bed alarm activated. No safety sitters available at this time. Unit nurse tech to stay with patient at this time. Dondra SpryMoore, Dorena Dorfman Islee, RN
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
## 2016-03-22 NOTE — Progress Notes (Signed)
Pt transferred to 6E29 from [facility] via bed. Pt remains on air overlay mattress. Oriented to room, staff, and equipment. Suction set-up at bedside. V/s stable. Will continue to monitor closely. Dondra Spry, RN

## 2016-03-22 NOTE — Progress Notes (Signed)
[place] TEAM 1 - Stepdown/ICU TEAM  Tanner Campbell  ZHY:[id number] DOB: [birth year] DOA: 02/06/2016 PCP: No primary care provider on file.    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  He decompensated 6/23 and required intubation. The patient was found to have H. Influenza positive sputum and treated with Rocephin. CXR demonstrated a large right pleural effusion and he underwent a thoracentesis (1L serous fluid removed - exudative by protein). He had difficulty with weaning and was transferred to [facility] on 6/29 orally intubated for ventilator weaning. The patient developed fever and antibiotics were expanded to vancomycin, cefepime and diflucan. He had progressed to weaning on PSV x 4 hours as of 7/3. He self extubated on 7/5 but required BiPAP off and on thereafter. After HD 7/10his mental status worsened and he became minimally responsive. CXR was consistent with RLL opacification and he was re-intubated. He was re-cultured and also found to have increased Troponin for which Cardiology was consulted. He was placed on LMWH, but had a GIB while on this w/ his hgb dropping as low as 8.4.  On 7/14 he remained ventilator dependent. His sputum cultures grew MSSA and E coli and he was noted to have + blood cultures. Because of these acutely worsening issues it was felt best to transfer him to the ICU at [facility].   Significant Events: 6/21 admitted at [facility] 6/29 transfer to [facility] 7/14 transfer from [facility] to [facility] 7/18 trach placed 8/9 patient pulled out his own trach 8/18 transferred to [place] bed   Subjective: The patient is resting comfortably and in no respiratory distress.  He crawled into the floor earlier this morning but there was no evidence of injury or fall.  This is repeated behavior despite him being educated on the  need to cease this behavior.  Assessment & Plan:  Acute hypoxic respiratory failure - s/p trach 7/18 - HCAP trach out per pt 8/9 - no evidence of distress   Coag negative Staph species in blood cx 7/30 and again 8/9 Blood cx obtained 7/30 1 of 2 + - repeat cx on 8/9 again 1 of 2 + raising concern this represented a true bacteremia - empiric abx tx dosed for equivalent of 1 week  - the pt had already had a TEE this admit, though it was before these blood cx were obtained - currently off abx and afebrile - if WBC does not normalize, or if fever returns, will have low threshold to resume abx - if fever recurs, repeat blood cx will be indicated, with possible need to commit to 4 full weeks of IV abx   Bacteremia with Veillonella species (Gram negative rod) completed Flagyl x 2 weeks for bacteremia as per ID recs - repeat blood cx today  Bilateral pleural effusions s/p thoracentesis Stable on follow-up chest x-ray 8/8 - no respiratory distress w/ stable oxygenation  NSTEMI vs demand ischemia nuclear medicine stress test noted basal inferior and mid inferior perfusion defecst - Cards has come to the conclusion the patient is not an appropriate candidate for cardiac cath and is recommending ongoing medical therapy  Small pericardial effusion with friction rub - resolved  ANA negative - TEE without evidence of endocarditis/vegetations - hemodynamically stable   Mild to moderate aortic regurgitation Hemodynamically stable   ESRD on  HD Tue/Th/Sat Nephrology following   Reported recent GIB on LMWH No evidence of acute blood loss a this time - this occurred while the pt was at [facility] when lovenox was begun during an episode of CP - GI did not feel EGD was warranted  Anemia of chronic kidney disease + acute blood loss  No evidence of blood loss presently - reports of acute blood loss date back to [facility] when pt was placed on lovenox during episode of CP (no records available in Epic) - Hgb presently  stable   Recent Labs Lab 03/16/16 2302 03/19/16 0357 03/20/16 0551 03/21/16 0409 03/22/16 0358  HGB 8.6* 9.0* 8.5* 8.7* 8.5*    DM type II w/ hyperglycemia  CBG variable - follow   Acute Encephalopathy in setting of critical illness Resolved   Severe malnutrition in context of acute illness Patient is much more alert and has now been cleared for diet - tolerating well thus far   Dysphagia Cleared for modified diet by SLP  Disposition Planning  Pending SNF placement   DVT prophylaxis: SQ heparin  Code Status: FULL CODE Family Communication: no family present at time of exam  Disposition Plan: goal is for SNF placement   Consultants:  PCCM Cardiology  ID Nephrology  ENT  Antimicrobials:  Cipro 7/12>7/14 Vanc 7/2>7/17 Vanc 8/10 > 8/15 Cefepime 7/14>7/21 Fluconazole 7/7>7/17 Flagyl 7/17 > 7/31  Objective: Blood pressure 117/71, pulse 94, temperature 98.8 F (37.1 C), temperature source Oral, resp. rate 17, height 5' 5"  (1.651 m), weight 65 kg (143 lb 4.8 oz), SpO2 100 %.  Intake/Output Summary (Last 24 hours) at 03/22/16 1624 Last data filed at 03/22/16 1407  Gross per 24 hour  Intake              840 ml  Output             2334 ml  Net            -1494 ml   Filed Weights   03/21/16 0411 03/21/16 1817 03/22/16 0331  Weight: 58 kg (127 lb 13.9 oz) 57 kg (125 lb 10.6 oz) 65 kg (143 lb 4.8 oz)    Examination: Gen:  Resting comfortably in bed  Lungs:  Good air movement throughout all fields CV: Regular rate and rhythm  Abdom:  Thin, NT/ND, soft, bs+, no rebound Ext:  No signif edema - low muscle mass   CBC:  Recent Labs Lab 03/16/16 2302 03/19/16 0357 03/20/16 0551 03/21/16 0409 03/22/16 0358  WBC 11.5* 16.0* 12.4* 22.2* 23.5*  NEUTROABS  --   --  9.1* 20.0* 21.0*  HGB 8.6* 9.0* 8.5* 8.7* 8.5*  HCT 29.0* 31.2* 29.0* 29.9* 29.7*  MCV 97.0 96.9 96.3 95.2 97.4  PLT 256 302 246 295 546   Basic Metabolic Panel:  Recent Labs Lab  03/16/16 0757 03/16/16 2302 03/19/16 0357 03/20/16 0551 03/21/16 0409 03/22/16 0358  NA 135 135 133* 133* 131* 133*  K 4.1 3.1* 5.1 4.3 6.1* 5.0  CL 95* 96* 94* 96* 89* 91*  CO2 26 30 28 29 30 29   GLUCOSE 143* 96 107* 125* 203* 165*  BUN 33* 16 50* 29* 50* 25*  CREATININE 3.73* 2.13* 4.52* 2.47* 3.61* 2.30*  CALCIUM 8.6* 8.0* 8.4* 8.2* 8.2* 8.2*  MG  --   --   --  1.6* 1.5* 1.6*  PHOS 3.8  --   --   --   --   --      GFR: Estimated  Creatinine Clearance: 36.8 mL/min (by C-G formula based on SCr of 2.3 mg/dL).  Liver Function Tests:  Recent Labs Lab 03/16/16 0757 03/16/16 2302 03/19/16 0357  AST  --  30 30  ALT  --  19 20  ALKPHOS  --  239* 231*  BILITOT  --  0.7 1.0  PROT  --  5.3* 5.8*  ALBUMIN 1.9* 1.8* 1.8*    HbA1C: Hgb A1c MFr Bld  Date/Time Value Ref Range Status  02/02/2016 06:50 AM 7.7 (H) 4.8 - 5.6 % Final    Comment:    (NOTE)         Pre-diabetes: 5.7 - 6.4         Diabetes: >6.4         Glycemic control for adults with diabetes: <7.0   01/25/2016 04:32 AM 8.2 (H) 4.0 - 6.0 % Final    CBG:  Recent Labs Lab 03/21/16 2325 03/22/16 0315 03/22/16 0834 03/22/16 1119 03/22/16 1611  GLUCAP 208* 158* 193* 176* 224*     Scheduled Meds: . antiseptic oral rinse  7 mL Mouth Rinse QID  . aspirin  81 mg Oral Daily  . atorvastatin  80 mg Oral q1800  . bisacodyl  5 mg Oral BID  . chlorhexidine gluconate (SAGE KIT)  15 mL Mouth Rinse BID  . darbepoetin (ARANESP) injection - DIALYSIS  200 mcg Intravenous Q Thu-HD  . feeding supplement (ENSURE ENLIVE)  237 mL Oral BID PC  . feeding supplement (NEPRO CARB STEADY)  237 mL Oral BID BM  . gabapentin  300 mg Oral BID  . insulin aspart  0-9 Units Subcutaneous Q4H  . ipratropium-albuterol  3 mL Nebulization TID  . isosorbide dinitrate  10 mg Oral TID  . metoCLOPramide (REGLAN) injection  5 mg Intravenous Q6H  . metoprolol tartrate  25 mg Oral BID  . multivitamin  1 tablet Oral QHS  . pantoprazole  40 mg  Oral Q1200  . polyethylene glycol  17 g Oral Daily  . senna-docusate  1 tablet Oral BID     LOS: 35 days    Cherene Altes, MD [facility] Office  [phone] Pager - Text Page per Amion as per below:  On-Call/Text Page:      [email or web address]      password [email or web address]  If 7PM-7AM, please contact night-coverage [email or web address] Password [facility] 03/22/2016, 4:24 PM

## 2016-03-22 NOTE — Progress Notes (Signed)
Page to Dr. Sharon Seller for notification of patient crawling onto floor.

## 2016-03-22 NOTE — Progress Notes (Signed)
Chest pain 2/10 at this time.

## 2016-03-22 NOTE — Progress Notes (Signed)
Pt found on the ground next to the bed and reports that he "crawled out of bed to go to the bathroom". Pt denies hitting his head, no notable signs of trauma or injury noted. Bed was in the low position, as it is an air overlay bed, with all four side rails up. Pt reports he "crawled through the opening between the two rails". Pt assisted back to bed. 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 He states " I just didn't want to go in the bed all over myself".

## 2016-03-22 NOTE — Progress Notes (Signed)
[facility] ROUNDING NOTE   Subjective:   Interval History: no changes this am appears stable   Objective:  Vital signs in last 24 hours:  Temp:  [97 F (36.1 C)-99.8 F (37.7 C)] 98.4 F (36.9 C) (08/18 0844) Pulse Rate:  [90-120] 94 (08/18 0844) Resp:  [12-42] 18 (08/18 0844) BP: (97-146)/(45-84) 133/67 (08/18 0844) SpO2:  [92 %-100 %] 93 % (08/18 0948) Weight:  [57 kg (125 lb 10.6 oz)-65 kg (143 lb 4.8 oz)] 65 kg (143 lb 4.8 oz) (08/18 0331)  Weight change: -1 kg (-2 lb 3.3 oz) Filed Weights   03/21/16 0411 03/21/16 1817 03/22/16 0331  Weight: 58 kg (127 lb 13.9 oz) 57 kg (125 lb 10.6 oz) 65 kg (143 lb 4.8 oz)    Intake/Output: I/O last 3 completed shifts: In: 36 [P.O.:480; I.V.:10] Out: 2335 [Other:2334; Stool:1]   Intake/Output this shift:  No intake/output data recorded.  CVS- RRR RS- CTA ABD- BS present soft non-distended EXT- no edemamuscle wasting, AV access LUE with thrill and echymoses   Basic Metabolic Panel:  Recent Labs Lab 03/16/16 0757 03/16/16 2302 03/19/16 0357 03/20/16 0551 03/21/16 0409 03/22/16 0358  NA 135 135 133* 133* 131* 133*  K 4.1 3.1* 5.1 4.3 6.1* 5.0  CL 95* 96* 94* 96* 89* 91*  CO2 _0 GLUCOSE 143* 96 107* 125* 203* 165*  BUN 33* 16 50* 29* 50* 25*  CREATININE 3.73* 2.13* 4.52* 2.47* 3.61* 2.30*  CALCIUM 8.6* 8.0* 8.4* 8.2* 8.2* 8.2*  MG  --   --   --  1.6* 1.5* 1.6*  PHOS 3.8  --   --   --   --   --     Liver Function Tests:  Recent Labs Lab 03/16/16 0757 03/16/16 2302 03/19/16 0357  AST  --  30 30  ALT  --  19 20  ALKPHOS  --  239* 231*  BILITOT  --  0.7 1.0  PROT  --  5.3* 5.8*  ALBUMIN 1.9* 1.8* 1.8*   No results for input(s): LIPASE, AMYLASE in the last 168 hours. No results for input(s): AMMONIA in the last 168 hours.  CBC:  Recent Labs Lab 03/16/16 2302 03/19/16 0357 03/20/16 0551 03/21/16 0409 03/22/16 0358  WBC 11.5* 16.0* 12.4* 22.2* 23.5*  NEUTROABS  --   --   9.1* 20.0* 21.0*  HGB 8.6* 9.0* 8.5* 8.7* 8.5*  HCT 29.0* 31.2* 29.0* 29.9* 29.7*  MCV 97.0 96.9 96.3 95.2 97.4  PLT 256 302 246 295 267    Cardiac Enzymes: No results for input(s): CKTOTAL, CKMB, CKMBINDEX, TROPONINI in the last 168 hours.  BNP: Invalid input(s): POCBNP  CBG:  Recent Labs Lab 03/21/16 1815 03/21/16 2014 03/21/16 2325 03/22/16 0315 03/22/16 0834  GLUCAP 91 116* 208* 158* 193*    Microbiology: Results for orders placed or performed during the hospital encounter of 02/27/2016  MRSA PCR Screening     Status: None   Collection Time: 02/28/2016  6:48 PM  Result Value Ref Range Status   MRSA by PCR NEGATIVE NEGATIVE Final    Comment:        The GeneXpert MRSA Assay (FDA approved for NASAL specimens only), is one component of a comprehensive MRSA colonization surveillance program. It is not intended to diagnose MRSA infection nor to guide or monitor treatment for MRSA infections.   Culture, blood (Routine X 2) w Reflex to ID Panel     Status: None  Collection Time: 03/02/2016  9:52 AM  Result Value Ref Range Status   Specimen Description BLOOD RIGHT HAND  Final   Special Requests IN PEDIATRIC BOTTLE  3CC  Final   Culture NO GROWTH 5 DAYS  Final   Report Status 02/25/2016 FINAL  Final  Culture, blood (Routine X 2) w Reflex to ID Panel     Status: None   Collection Time: 02/13/2016  9:56 AM  Result Value Ref Range Status   Specimen Description BLOOD RIGHT HAND  Final   Special Requests IN PEDIATRIC BOTTLE  2CC  Final   Culture NO GROWTH 5 DAYS  Final   Report Status 02/25/2016 FINAL  Final  Culture, blood (routine x 2)     Status: None   Collection Time: 03/03/16  1:11 PM  Result Value Ref Range Status   Specimen Description BLOOD BLOOD RIGHT HAND  Final   Special Requests IN PEDIATRIC BOTTLE 3CC  Final   Culture NO GROWTH 5 DAYS  Final   Report Status 03/20/2016 FINAL  Final  Culture, blood (routine x 2)     Status: Abnormal   Collection Time:  03/03/16  1:16 PM  Result Value Ref Range Status   Specimen Description BLOOD BLOOD RIGHT ARM  Final   Special Requests IN PEDIATRIC BOTTLE 3CC  Final   Culture  Setup Time   Final    GRAM POSITIVE COCCI IN CLUSTERS AEROBIC BOTTLE ONLY CRITICAL RESULT CALLED TO, READ BACK BY AND VERIFIED WITH: M TURNER 03/04/16 @ 73 M VESTAL    Culture STAPHYLOCOCCUS SPECIES (COAGULASE NEGATIVE) (A)  Final   Report Status 03/16/2016 FINAL  Final  Blood Culture ID Panel (Reflexed)     Status: Abnormal   Collection Time: 03/03/16  1:16 PM  Result Value Ref Range Status   Enterococcus species NOT DETECTED NOT DETECTED Final   Vancomycin resistance NOT DETECTED NOT DETECTED Final   Listeria monocytogenes NOT DETECTED NOT DETECTED Final   Staphylococcus species DETECTED (A) NOT DETECTED Final    Comment: CRITICAL RESULT CALLED TO, READ BACK BY AND VERIFIED WITH: M TURNER 03/04/16 @ 1143 M VESTAL    Staphylococcus aureus NOT DETECTED NOT DETECTED Final   Methicillin resistance NOT DETECTED NOT DETECTED Final   Streptococcus species NOT DETECTED NOT DETECTED Final   Streptococcus agalactiae NOT DETECTED NOT DETECTED Final   Streptococcus pneumoniae NOT DETECTED NOT DETECTED Final   Streptococcus pyogenes NOT DETECTED NOT DETECTED Final   Acinetobacter baumannii NOT DETECTED NOT DETECTED Final   Enterobacteriaceae species NOT DETECTED NOT DETECTED Final   Enterobacter cloacae complex NOT DETECTED NOT DETECTED Final   Escherichia coli NOT DETECTED NOT DETECTED Final   Klebsiella oxytoca NOT DETECTED NOT DETECTED Final   Klebsiella pneumoniae NOT DETECTED NOT DETECTED Final   Proteus species NOT DETECTED NOT DETECTED Final   Serratia marcescens NOT DETECTED NOT DETECTED Final   Carbapenem resistance NOT DETECTED NOT DETECTED Final   Haemophilus influenzae NOT DETECTED NOT DETECTED Final   Neisseria meningitidis NOT DETECTED NOT DETECTED Final   Pseudomonas aeruginosa NOT DETECTED NOT DETECTED Final    Candida albicans NOT DETECTED NOT DETECTED Final   Candida glabrata NOT DETECTED NOT DETECTED Final   Candida krusei NOT DETECTED NOT DETECTED Final   Candida parapsilosis NOT DETECTED NOT DETECTED Final   Candida tropicalis NOT DETECTED NOT DETECTED Final  Culture, respiratory (NON-Expectorated)     Status: None   Collection Time: 03/03/16  1:19 PM  Result Value Ref Range Status  Specimen Description TRACHEAL ASPIRATE  Final   Special Requests NONE  Final   Gram Stain   Final    ABUNDANT WBC PRESENT, PREDOMINANTLY PMN RARE SQUAMOUS EPITHELIAL CELLS PRESENT FEW GRAM NEGATIVE COCCI IN PAIRS RARE GRAM NEGATIVE RODS RARE GRAM POSITIVE COCCI IN PAIRS    Culture MULTIPLE ORGANISMS PRESENT, NONE PREDOMINANT  Final   Report Status 03/05/2016 FINAL  Final  Culture, blood (routine x 2)     Status: Abnormal (Preliminary result)   Collection Time: 04/01/2016  5:00 PM  Result Value Ref Range Status   Specimen Description BLOOD RIGHT HAND  Final   Special Requests BOTTLES DRAWN AEROBIC AND ANAEROBIC  5CC   Final   Culture  Setup Time   Final    GRAM POSITIVE COCCI IN CLUSTERS ANAEROBIC BOTTLE ONLY CRITICAL RESULT CALLED TO, READ BACK BY AND VERIFIED WITH: A. JOHNSTON, PHARM AT [id number] ON [id number] BY S. YARBROUGH    Culture (A)  Final    STAPHYLOCOCCUS SPECIES (COAGULASE NEGATIVE) Unable to obtain susceptibility results.  Sent to [place] for further susceptibility testing. RESULT CALLED TO, READ BACK BY AND VERIFIED WITH: D MUHORO,RN AT 0906 03/17/16 BY L BENFIELD    Report Status PENDING  Incomplete  Susceptibility, Aer + Anaerob     Status: None   Collection Time: 03/22/2016  5:00 PM  Result Value Ref Range Status   Suscept, Aer + Anaerob Preliminary report  Final    Comment: (NOTE) Performed At: [facility] [place], [place] [id number] Lindon Romp MD TG:[id number]    Source of Sample BLOOD  Final    Comment: COAGULASE NEGATIVE STAPH FOR SENSITIVITY TESTING   Susceptibility Result     Status: Abnormal (Preliminary result)   Collection Time: 04/02/2016  5:00 PM  Result Value Ref Range Status   Suscept Result 1 Comment (A)  Final    Comment: (NOTE) Coagulase negative Staphylococcus species. Identification performed by account, not confirmed by this laboratory. Performed At: [facility] [place], [place] [id number] Lindon Romp MD WI:[id number]    Antimicrobial Suscept PENDING  Incomplete  Culture, blood (routine x 2)     Status: None   Collection Time: 03/24/2016  6:32 PM  Result Value Ref Range Status   Specimen Description BLOOD RIGHT ANTECUBITAL  Final   Special Requests IN PEDIATRIC BOTTLE [place]  Final   Culture NO GROWTH 5 DAYS  Final   Report Status 03/18/2016 FINAL  Final    Coagulation Studies: No results for input(s): LABPROT, INR in the last 72 hours.  Urinalysis: No results for input(s): COLORURINE, LABSPEC, PHURINE, GLUCOSEU, HGBUR, BILIRUBINUR, KETONESUR, PROTEINUR, UROBILINOGEN, NITRITE, LEUKOCYTESUR in the last 72 hours.  Invalid input(s): APPERANCEUR    Imaging: No results found.   Medications:     . antiseptic oral rinse  7 mL Mouth Rinse QID  . aspirin  81 mg Oral Daily  . atorvastatin  80 mg Oral q1800  . bisacodyl  5 mg Oral BID  . chlorhexidine gluconate (SAGE KIT)  15 mL Mouth Rinse BID  . darbepoetin (ARANESP) injection - DIALYSIS  200 mcg Intravenous Q Thu-HD  . feeding supplement (ENSURE ENLIVE)  237 mL Oral BID PC  . feeding supplement (NEPRO CARB STEADY)  237 mL Oral BID BM  . gabapentin  300 mg Oral BID  . insulin aspart  0-9 Units Subcutaneous Q4H  . ipratropium-albuterol  3 mL Nebulization TID  . isosorbide dinitrate  10 mg Oral TID  .  metoCLOPramide (REGLAN) injection  5 mg Intravenous Q6H  . metoprolol tartrate  25 mg Oral BID  . multivitamin  1 tablet Oral QHS  . pantoprazole  40 mg Oral Q1200  . polyethylene glycol  17 g Oral Daily  . senna-docusate  1 tablet  Oral BID   acetaminophen, albuterol, dextrose, diphenhydrAMINE, metoprolol, morphine injection, nitroGLYCERIN, ondansetron (ZOFRAN) IV, oxyCODONE, sennosides  Assessment/ Plan:  initially was admitted to [facility] from 6/21 - 6/29 for acute respiratory failure in the setting of volume overload. He decompensated 6/23 and required intubation. The patient was found to have H. Influenza positive sputum and treated with Rocephin. CXR demonstrated a large right pleural effusion and he underwent a thoracentesis (1L serous fluid removed - exudative by protein). He had difficulty with weaning and was transferred to [facility] on 6/29 orally intubated for ventilator weaning. The patient developed fever and antibiotics were expanded to vancomycin, cefepime and diflucan. He had progressed to weaning on PSV x 4 hours as of 7/3. He self extubated on 7/5. Since that time he required BiPAP off and on.After HD 7/10 his mental status worsened and he became minimally responsive. CXR was consistent with RLL opacification and he was re-intubated. He was re-cultured and also found to have increased Troponin for which Cardiology was consulted. He was placed on LMWH, but had a GIB while on this w/ his hgb dropping as low as 8.4. On 7/14 he remained ventilator dependent. His sputum cultures grew MSSA and E coli and he was noted to have + blood cultures. Because of these acutely worsening issues it was felt best to transfer him to the ICU at [facility].    1. ESRD TTS dialysis [facility]) 2. 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 GI Stable   4, Anemia stable   Transfusion 7/24 and 7/27 5. Acute on chronic respiratory failure -- Per CCM 6. Bilateral pleural effusion 7. Bacteremia staph s/p cefipime   8. Severe malnutrition 9. Disposition -- return to [facility] unit    LOS: 14 Nolyn Eilert W _0 _1 :15 AM

## 2016-03-22 NOTE — Progress Notes (Signed)
Physical Therapy Treatment Patient Details Name: Tanner Campbell MRN: [id number] DOB: March 31, 1976 Today's Date: 03/22/2016    History of Present Illness Tanner Campbell is an 40 y.o. 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 He was intubated 6/23-7/5 (self extubated), 7/10-7/16 (self extubated), was then reintubated. Initially, sputum was positive for H influenza and subsequently for MSSA and Escherichia coli and blood cultures growing a gram-negative anaerobic organism. Thoracentesis on right showed borderline exudate by LDH criteria. 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  Fell OOB while sleeping 03/18/16.    PT Comments    Patient making slow improvement with mobility.  Follow Up Recommendations  Supervision/Assistance - 24 hour;SNF     Equipment Recommendations  Other (comment) (TBD)    Recommendations for Other Services       Precautions / Restrictions Precautions Precautions: Fall Precaution Comments: Patient has been found kneeling/crawling on floor x2.  Bed in lowest position Restrictions Weight Bearing Restrictions: No    Mobility  Bed Mobility Overal bed mobility: Needs Assistance Bed Mobility: Supine to Sit;Sit to Supine     Supine to sit: Mod assist Sit to supine: Min assist   General bed mobility comments: Verbal cues for technique.  Assist to raise trunk to sitting position.  Once upright, patient able to maintain sitting balance with min guard assist.   Patient required assist to bring LE's onto bed to return to supine.  Transfers Overall transfer level: Needs assistance Equipment used: 2 person hand held assist Transfers: Sit to/from Stand Sit to Stand: Mod assist;+2 physical assistance         General transfer comment: Mod  +2 assist to power up to standing.  Blocked knees for support.  Patient able to stand 30-45 seconds x2 with sitting rest between.  Patient declined to get OOB to chair.  Returned to supine.  Ambulation/Gait                 Stairs            Wheelchair Mobility    Modified Rankin (Stroke Patients Only)       Balance   Sitting-balance support: No upper extremity supported;Feet supported Sitting balance-Leahy Scale: Good     Standing balance support: Bilateral upper extremity supported Standing balance-Leahy Scale: Poor Standing balance comment: Able to reach upright position with +2 assist.  Mod assist of 2 to maintain standing balance.                    Cognition Arousal/Alertness: Awake/alert Behavior During Therapy: Flat affect Overall Cognitive Status: Impaired/Different from baseline Area of Impairment: Safety/judgement;Problem solving         Safety/Judgement: Decreased awareness of safety   Problem Solving: Slow processing;Difficulty sequencing;Requires verbal cues      Exercises      General Comments        Pertinent Vitals/Pain Pain Assessment: No/denies pain    Home Living                      Prior Function            PT Goals (current goals can now be found in the care plan section) Acute Rehab PT Goals Patient Stated Goal: to get  stronger Progress towards PT goals: Progressing toward goals    Frequency  Min 3X/week    PT Plan Current plan remains appropriate    Co-evaluation             End of Session Equipment Utilized During Treatment: Oxygen;Gait belt Activity Tolerance: Patient limited by fatigue Patient left: in bed;with call bell/phone within reach (Bed at lowest position with rails up. NT to set alarm)     Time: 1415-1435 PT Time Calculation (min) (ACUTE ONLY): 20 min  Charges:  $Therapeutic Activity: 8-22 mins                    G Codes:      Vena AustriaDavis, Darielys Giglia H 03/22/2016, 4:01 PM Durenda HurtSusan  H. Renaldo Fiddleravis, PT, [facility] Acute Rehab Services Pager [phone]

## 2016-03-22 NOTE — Progress Notes (Signed)
Pt is stating that he is walking out of here, though unable to walk, refusing to lay down. Post crawling oob onto the floor earlier today. Page to K. Sophia for notification with call back. New order for safety sitter at bedside and IV ativan. Will administer and monitor. Dondra Spry, RN

## 2016-03-22 NOTE — Progress Notes (Signed)
[facility] called CSW to state that they cannot accept pt at this point with his behaviors. [facility] will check on pt next week to see if he has improved.  Osborne Casco Valeria Boza LCSWA [phone]

## 2016-03-22 NOTE — Progress Notes (Signed)
Gave Nitro SL 0.4 X 3 VSS.  Pt states pain is better  4/10.  Paged Dr. Moshe Salisbury

## 2016-03-22 NOTE — Progress Notes (Signed)
EKG abnormal ST & T wave septal infarct age undetermined. Paged Dr Moshe SalisburyMcClung FYI.

## 2016-03-23 ENCOUNTER — Inpatient Hospital Stay (HOSPITAL_COMMUNITY): Payer: Medicare Other | Admitting: Anesthesiology

## 2016-03-23 LAB — RENAL FUNCTION PANEL
ALBUMIN: 1.8 g/dL — AB (ref 3.5–5.0)
Anion gap: 12 (ref 5–15)
BUN: 43 mg/dL — AB (ref 6–20)
CALCIUM: 8.3 mg/dL — AB (ref 8.9–10.3)
CO2: 28 mmol/L (ref 22–32)
CREATININE: 3.26 mg/dL — AB (ref 0.61–1.24)
Chloride: 91 mmol/L — ABNORMAL LOW (ref 101–111)
GFR calc Af Amer: 26 mL/min — ABNORMAL LOW (ref >60)
GFR calc non Af Amer: 22 mL/min — ABNORMAL LOW (ref >60)
GLUCOSE: 309 mg/dL — AB (ref 65–99)
PHOSPHORUS: 3.6 mg/dL (ref 2.5–4.6)
Potassium: 5.2 mmol/L — ABNORMAL HIGH (ref 3.5–5.1)
Sodium: 131 mmol/L — ABNORMAL LOW (ref 135–145)

## 2016-03-23 LAB — CBC
HCT: 26.3 % — ABNORMAL LOW (ref 39.0–52.0)
Hemoglobin: 7.4 g/dL — ABNORMAL LOW (ref 13.0–17.0)
MCH: 28.6 pg (ref 26.0–34.0)
MCHC: 28.1 g/dL — ABNORMAL LOW (ref 30.0–36.0)
MCV: 101.5 fL — AB (ref 78.0–100.0)
PLATELETS: 153 10*3/uL (ref 150–400)
RBC: 2.59 MIL/uL — AB (ref 4.22–5.81)
RDW: 18.3 % — ABNORMAL HIGH (ref 11.5–15.5)
WBC: 24.7 10*3/uL — ABNORMAL HIGH (ref 4.0–10.5)

## 2016-03-23 LAB — GLUCOSE, CAPILLARY
GLUCOSE-CAPILLARY: 270 mg/dL — AB (ref 65–99)
Glucose-Capillary: 306 mg/dL — ABNORMAL HIGH (ref 65–99)

## 2016-03-23 LAB — TROPONIN I: Troponin I: 0.11 ng/mL (ref <0.03)

## 2016-03-23 MED ORDER — SODIUM CHLORIDE 0.9 % IV SOLN: 100.0000 mL | INTRAVENOUS | Status: DC | PRN

## 2016-03-23 MED ORDER — ALTEPLASE 2 MG IJ SOLR: 2.0000 mg | Freq: Once | INTRAMUSCULAR | Status: DC | PRN

## 2016-03-23 MED ORDER — PENTAFLUOROPROP-TETRAFLUOROETH EX AERO: 1.0000 "application " | INHALATION_SPRAY | CUTANEOUS | Status: DC | PRN

## 2016-03-23 MED ORDER — LIDOCAINE-PRILOCAINE 2.5-2.5 % EX CREA: 1.0000 "application " | TOPICAL_CREAM | CUTANEOUS | Status: DC | PRN

## 2016-03-23 MED ORDER — LIDOCAINE HCL (PF) 1 % IJ SOLN: 5.0000 mL | INTRAMUSCULAR | Status: DC | PRN

## 2016-03-23 MED ORDER — HEPARIN SODIUM (PORCINE) 1000 UNIT/ML DIALYSIS: 1000.0000 [IU] | INTRAMUSCULAR | Status: DC | PRN

## 2016-03-23 MED FILL — Medication: Qty: 1 | Status: AC

## 2016-03-24 LAB — RENAL FUNCTION PANEL
Albumin: 1.3 g/dL — ABNORMAL LOW (ref 3.5–5.0)
Anion gap: 4 — ABNORMAL LOW (ref 5–15)
BUN: 19 mg/dL (ref 6–20)
CHLORIDE: 111 mmol/L (ref 101–111)
CO2: 24 mmol/L (ref 22–32)
CREATININE: 1.63 mg/dL — AB (ref 0.61–1.24)
Calcium: 10 mg/dL (ref 8.9–10.3)
GFR calc non Af Amer: 51 mL/min — ABNORMAL LOW (ref >60)
GFR, EST AFRICAN AMERICAN: 59 mL/min — AB (ref >60)
GLUCOSE: 202 mg/dL — AB (ref 65–99)
Phosphorus: 2.4 mg/dL — ABNORMAL LOW (ref 2.5–4.6)
Potassium: 4 mmol/L (ref 3.5–5.1)
Sodium: 139 mmol/L (ref 135–145)

## 2016-03-26 LAB — CULTURE, BLOOD (ROUTINE X 2)

## 2016-03-26 LAB — SUSCEPTIBILITY RESULT

## 2016-03-26 LAB — SUSCEPTIBILITY, AER + ANAEROB

## 2016-04-05 NOTE — Progress Notes (Deleted)
Addendum:  HD tx has been terminated prior pt went unresponsive

## 2016-04-05 NOTE — Progress Notes (Signed)
  [place] TEAM 1 - Stepdown/ICU TEAM  I received a page informing me that Mr. Tunks was being coded in the hemodialysis unit.  I presented immediately to the hemodialysis unit and found the patient intubated with anesthesia at the head of the bed and the code team administering CPR.  I was informed that the patient "appeared to be doing fine when he first came to dialysis and then suddenly became apneic and brady'd down into asystole."  CODE BLUE was called and CPR was initiated.  At the time of my arrival (~18-28min after Code initiated, but immediately upon being paged) chest compressions were being administered and the patient had received multiple rounds of epinephrine, a dose of calcium, and a dose of atropine.  In appropriate timing CPR was held and a pulse was sought.  The monitor revealed ventricular tachycardia/ventricular fib with no pulse and the patient was defibrillated.  Resuscitative efforts persisted.  At the next rhythm check the patient was in PEA.  ACLS meds and CPR were administered per protocol in turn.  Approximately 25 minutes into resuscitative efforts the patient remained in PEA.  His pupils were fixed and dilated.  Feeling that the chance of him making a meaningful recovery at this point was nil further resuscitative efforts were discontinued and the patient was declared dead at 09:07.  I called and spoke to the patient's sister via phone and informed her of the unexpected unfortunate events.  Lonia Blood, MD [facility] Office  [phone] Pager - Text Page per Amion as per below:  On-Call/Text Page:      [email or web address]      password [email or web address]  If 7PM-7AM, please contact night-coverage [email or web address] Password [email or web address] April 06, 2016, 9:35 AM

## 2016-04-05 NOTE — Progress Notes (Signed)
Spoke to Unisys CorporationJacque Perkins (on-call medical examiner).  Based on our conversation, the patient would not be a medical examiner's case.  Peri MarisAndrew Kalvyn Desa, MBA, BSN, RN

## 2016-04-05 NOTE — Progress Notes (Addendum)
Approximated at 0840 pt c/o not feeling well, immediately rinsed blood back to pateint and hemodialysis tx terminated.. Suddenly pt stop breathing and went unresponsive.  Pt currently on oxygen at 6 liters/min.at the same time bleeding noted on the decannulated trach site., suction provided immediately and bleeding site reinforced with dressing. CPR initated at the same time with nursing supervisor and NP at bedside. Rapid response and Code blue activated immediately.  Bed in locked position with call bell within reach.. Dr Elvis CoilMartin Webb has been informed of the situation and primary MD made aware. See code for further instructions and documentation.

## 2016-04-05 NOTE — Progress Notes (Signed)
Pt sleeping comfortably. Dondra SpryMoore, Malka Bocek Islee, RN

## 2016-04-05 NOTE — Progress Notes (Signed)
Left voicemail for on-call Medical examiner @ [phone].  Peri Maris, MBA, BSN, RN

## 2016-04-05 NOTE — Procedures (Signed)
I have seen and examined this patient and agree with the plan of care  Seen on dialysis   AVG Left arm  No issues Goal 2.5 L no edema  Using oxygen supplement   Tanner Campbell 03/13/2016, 7:50 AM

## 2016-04-05 NOTE — Significant Event (Signed)
Rapid Response Event Note  Overview:  Called by RN for STAT assistance  Time Called: 0845 Arrival Time: 0848    Initial Focused Assessment:  Code Blue called prior to my arrival.  ACLS in progress when arrived.     Interventions:  Nat Math, NP at bedside.  See code [facility] sheet, Called Dr. Sharon Seller, and Dr. Delton Coombes  Plan of Care (if not transferred):  Event Summary:  Patietn expired at 0907   at      at          [facility], Maryagnes Amos

## 2016-04-05 NOTE — Progress Notes (Signed)
[facility].  [facility] stated that the patient would not be eligible for eye or organ donation.  Peri MarisAndrew Jaice Lague, MBA, BSN, RN

## 2016-04-05 NOTE — Progress Notes (Signed)
Pt continues to sling his legs over the side of bed. Low bed floor mats at bedside to prevent fall as pt continues to try to crawl OOB. Pt states "someone just told me I could get OOB". Pt educated that it is not safe for him to attempt to get OOB independently. Dondra SpryMoore, Challis Crill Islee, RN

## 2016-04-05 NOTE — Progress Notes (Signed)
   03/05/2016 0900  Clinical Encounter Type  Visited With Health care provider  Visit Type Code;Death  Referral From Nurse  Chaplain responded to Code Blue in Hemodialysis, was unable to be of help to that patient and family had not arrived. However, visited with patients in adjoining cubicles to ensure they were kept calm and specifically reassured one man who had told his doctor he was not going to get dialysis because of code. Shery Wauneka, Chaplain

## 2016-04-05 NOTE — Progress Notes (Signed)
Released all patient belongings to the patient's sister, Joesphine BareCrystal Ciani.  Peri MarisAndrew Reiner Loewen, MBA, BSN, RN

## 2016-04-05 NOTE — Code Documentation (Signed)
CODE BLUE NOTE  Patient Name: Tanner Campbell   MRN: [id number]   Date of Birth/ Sex: 06/11/1976 , male      Admission Date: 02/18/2016  Attending Provider: Lonia Blood, MD  Primary Diagnosis: resp failure RESPIRATORY FAILURE bacteremia cp anemia, nausea suspected prior GI Bleed cm    Indication: Pt was in his usual state of health until this AM, when he was noted to be in respiratory distress and asystole. Code blue was subsequently called. At the time of arrival on scene, ACLS protocol was underway.   Technical Description:  - CPR performance duration:  25 minutes  - Was defibrillation or cardioversion used? Yes   - Was external pacer placed? No  - Was patient intubated pre/post CPR? Yes    Medications Administered: Y = Yes; Blank = No Amiodarone    Atropine  y  Calcium  y  Epinephrine  y  Lidocaine    Magnesium    Norepinephrine    Phenylephrine    Sodium bicarbonate    Vasopressin    Other     Post CPR evaluation:  - Final Status - Was patient successfully resuscitated ?  no  Miscellaneous Information:  - Time of death:  9.07 am   - Primary team notified?  Yes  - Family Notified? No        Freddrick March, MD   04/04/2016, 9:09 AM

## 2016-04-05 NOTE — Accreditation Note (Signed)
o Restraints reported to CMS  Pursuant to regulation 482.13 (G) (3) use of restraints was logged and CMS was notified via email on 08.21.2017 at [id number] by Rosine Door, RN, Patient Safety and Accreditation.

## 2016-04-05 NOTE — Progress Notes (Addendum)
Upon arrival to unit, pt noted to be confused, agitated, pulling at lines, leads and trying to remove pulse ox probe. Mittens in place to deter removal of equipment. VSS upon arrival and hemodialysis treatment started as ordered. Sats 100% on 6L [place] with trach stoma covered with medipore tape soiled with moderate yellowish-green drainage. Pt noncomplaint with maintaining his arm straight during treatment. Verbal order received to use wrist restraint on L wrist to prevent infiltration and further damage to L upper arm AVF. Wrist restraint placed on L wrist per policy. Multiple alarms during treatment related to patient moving and restlessness. Attempted to reorient patient. Pt non verbal with dialysis staff but did answer "No." when asked if he was going to talk to [place] today. At approximately 0840, this nurse (charge nurse) called to bay to assist treatment nurse. Upon arrival in bay, treatment nurse rinsing blood back to patient and discontinuing treatment due to pt coloring dusky, responsiveness decreased with no effort at interaction, respiratory efforts labored with little air movement noted. Bright red blood noted to trach stoma site and on lips. Wrist restraint removed per order at discontinuation of treatment.  Suction setup in place and ready for use. NP and MD in unit, called to bedside. Ambu bag utilized to provide rescue breathing but efficiency compromised by trach stoma. Manual pressure applied to trach stoma while providing rescue breaths. Code blue called. Pt heart rhythm changed from NSR to bradycardia followed by asystole. CPR initiated. See code documentation for further details. Dr. Hyman Hopes notified of treatment discontinuation and code.

## 2016-04-05 NOTE — Progress Notes (Signed)
Pt with attempts to get OOB and pulled IV out. New L hand IV placed. Pt placed in green non-restraint mittens. No sitter available to remain at bedside. Dondra Spry, RN

## 2016-04-05 DEATH — deceased

## 2016-05-05 NOTE — Discharge Summary (Signed)
Death Summary  Tanner Campbell ZOX:[id number] DOB: May 11, 1976 DOA: Feb 27, 2016  Admit date: 2016-02-27 Date of Death: 04/03/2016  Final Diagnoses:  Idiopathic acute respiratory arrest  Asystolic/PEA cardiac arrest  Acute hypoxic respiratory failure - s/p trach 7/18 HCAP Coag negative Staph species in blood cx 7/30 and again 8/9 Bacteremia with Veillonella species (Gram negative rod) Bilateral pleural effusions s/p thoracentesis NSTEMI vs demand ischemia Small pericardial effusion  Mild to moderate aortic regurgitation ESRD on HD Tue/Th/Sat GIB on LMWH Anemia of chronic kidney disease + acute blood loss  DM II w/ hyperglycemia  Acute Encephalopathy in setting of critical illness Severe malnutrition in context of acute illness Dysphagia  History of present illness:  40 y/o M with Hx of MS, DM2, HTN, HLD, ESRD on HD (T/Th/S), COPD, and prior back surgery who initially was admitted to [facility] from 6/21 - 6/29 for acute respiratory failure in the setting of volume overload.  He decompensated 6/23 and required intubation. The patient was found to have H. influenza positive sputum and treated with Rocephin. CXR demonstrated a large right pleural effusion and he underwent a thoracentesis (1L serous fluid removed - exudative by protein). He had difficulty with weaning and was transferred to [facility] on 6/29 orally intubated for ventilator weaning. The patient developed fever and antibiotics were expanded to vancomycin, cefepime and diflucan. He progressed to weaning on PSV x 4 hours as of 7/3. He self extubated on 7/5 but required BiPAP off and on thereafter. After HD 7/10his mental status worsened and he became minimally responsive. CXR was consistent with RLL opacification and he was re-intubated. He was re-cultured and also found to have increased Troponin for which Cardiology was consulted. He was placed on LMWH, but had a suspected GIB while on this w/ his hgb dropping as low as 8.4.  On 26-Feb-2041 he  remained ventilator dependent. His sputum cultures grew MSSA and E coli and he was noted to have + blood cultures. Because of these acutely worsening issues it was felt best to transfer him to the ICU at [facility].   Hospital Course:   Significant Events: 6/21 admitted at [facility] 6/29 transfer to [facility] 02/27/2023 transfer from [facility] to [facility] - admitted to ICU 7/18 trach placed 8/9 patient pulled out his own trach 8/18 transferred to 6E tele bed  April 04, 2023 Code Blue in HD unit - unclear precipitating event - pt did not survive prolonged resuscitation attempts   Tanner Campbell had a very complex medical history.  As noted above [facility] on Feb 27, 2016.  He received aggressive ongoing medical care there as per the PCCM team.  The patient stabilized medically initially but continued to have intermittent respiratory issues and required complex trach care for which he was transferred to the stepdown unit.  Once transferred to the stepdown unit TRH assumed the primary attending role in PCCM continued to follow for ongoing trach care.  During his time on the stepdown unit vacillating mental status improved to be challenging.  Ultimately with titration of medications the patient achieved a stable state in regard to his mental status.  Despite pulling out his own trach on 03/14/2016 he remained stable from a respiratory standpoint.  With improvements noted in his respiratory status as well as mental status he was transferred to the nephrology telemetry unit on 8/18.  No new acute events were appreciated during his time on that unit.  On 2016-04-03 while in the dialysis unit the patient  suffered an unexpected acute arrest.  History provided by the bedside dialysis nurse suggested a respiratory etiology leading to asystolic cardiac arrest.  The patient's airway was cared for by anesthesiology throughout the code.  Unfortunately despite 25 minutes of ACLS protocol the  patient never exhibited a recovery.  The patient was pronounced dead at 09:07 with the cause of death being idiopathic respiratory arrest leading to asystolic and PEA cardiac arrest.  Signed:  MCCLUNG,JEFFREY T  [facility] 04/08/2016, 4:45 PM

## 2017-01-02 IMAGING — CR DG CHEST 1V PORT
2 series · 2 of 2 positions shown · non-contrast
Comparison: Chest x-rays dated 02/20/2016 and 02/19/2016.

CLINICAL DATA: Respiratory failure, tracheostomy.

EXAM:
PORTABLE CHEST 1 VIEW

[AP (1 of 2)]
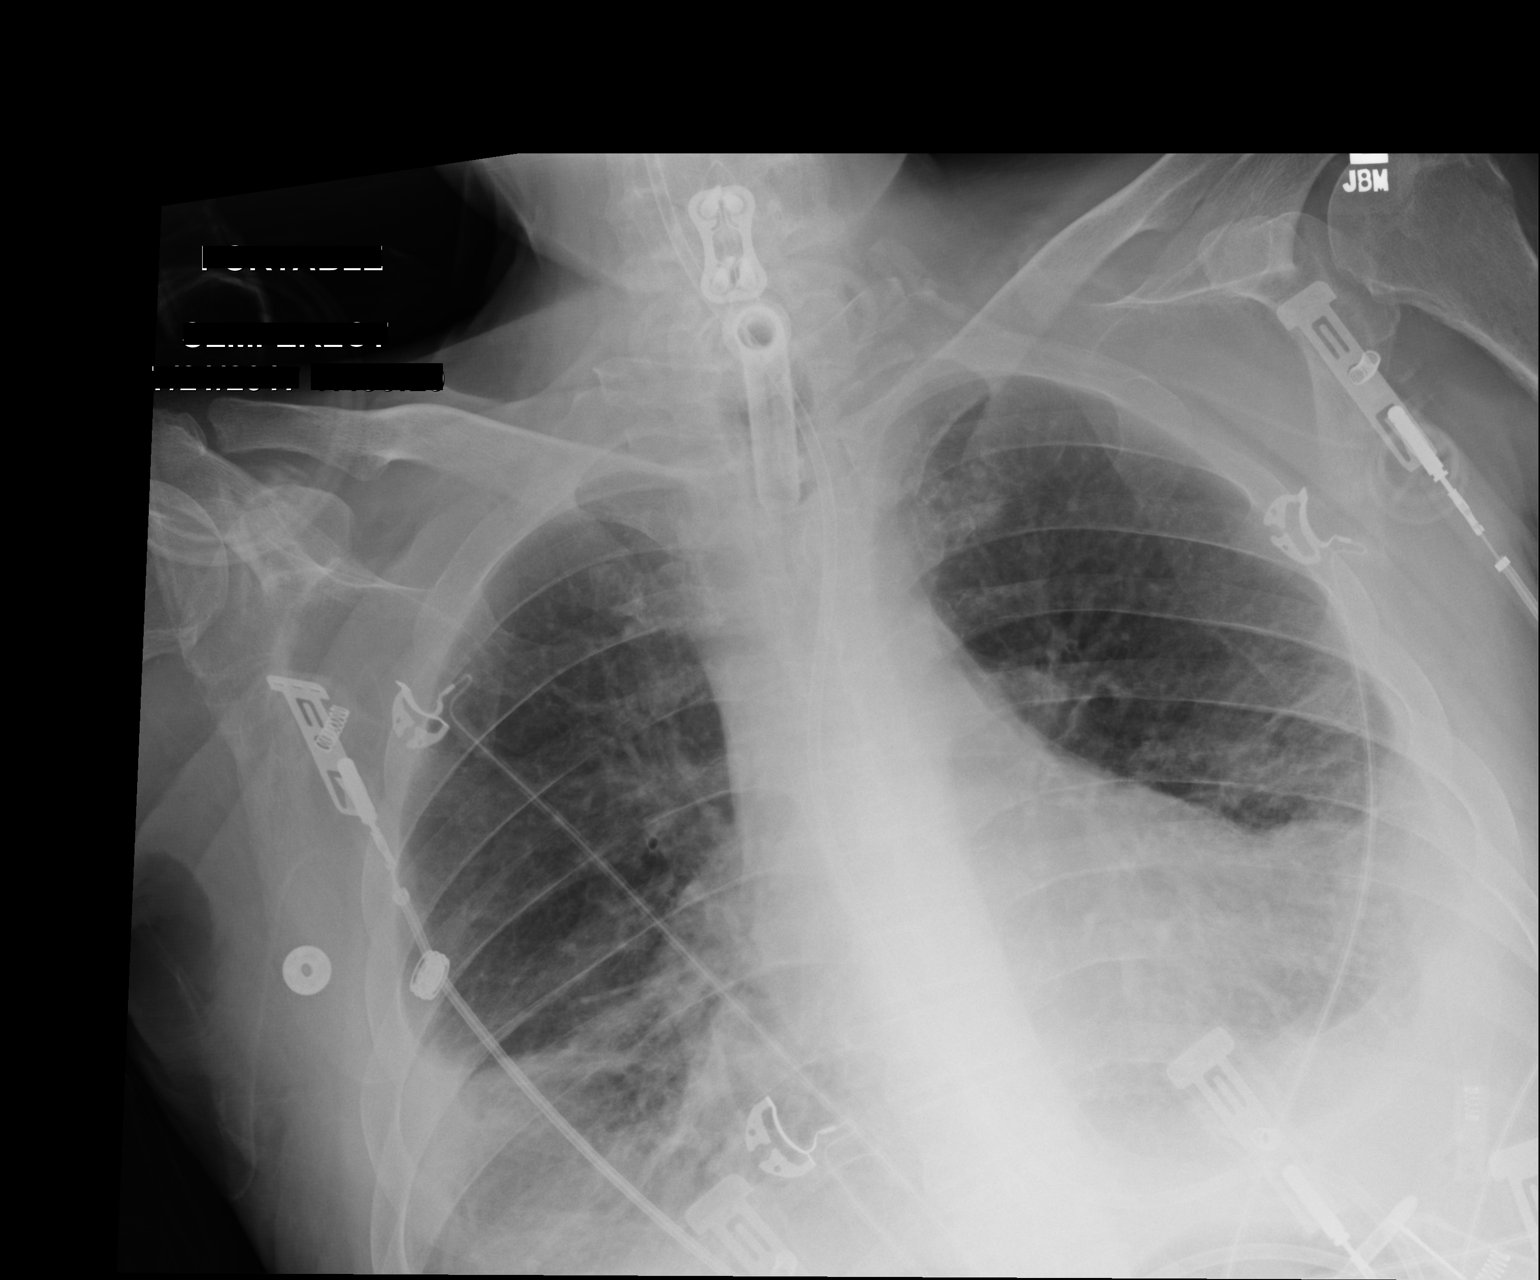

[AP (2 of 2)]
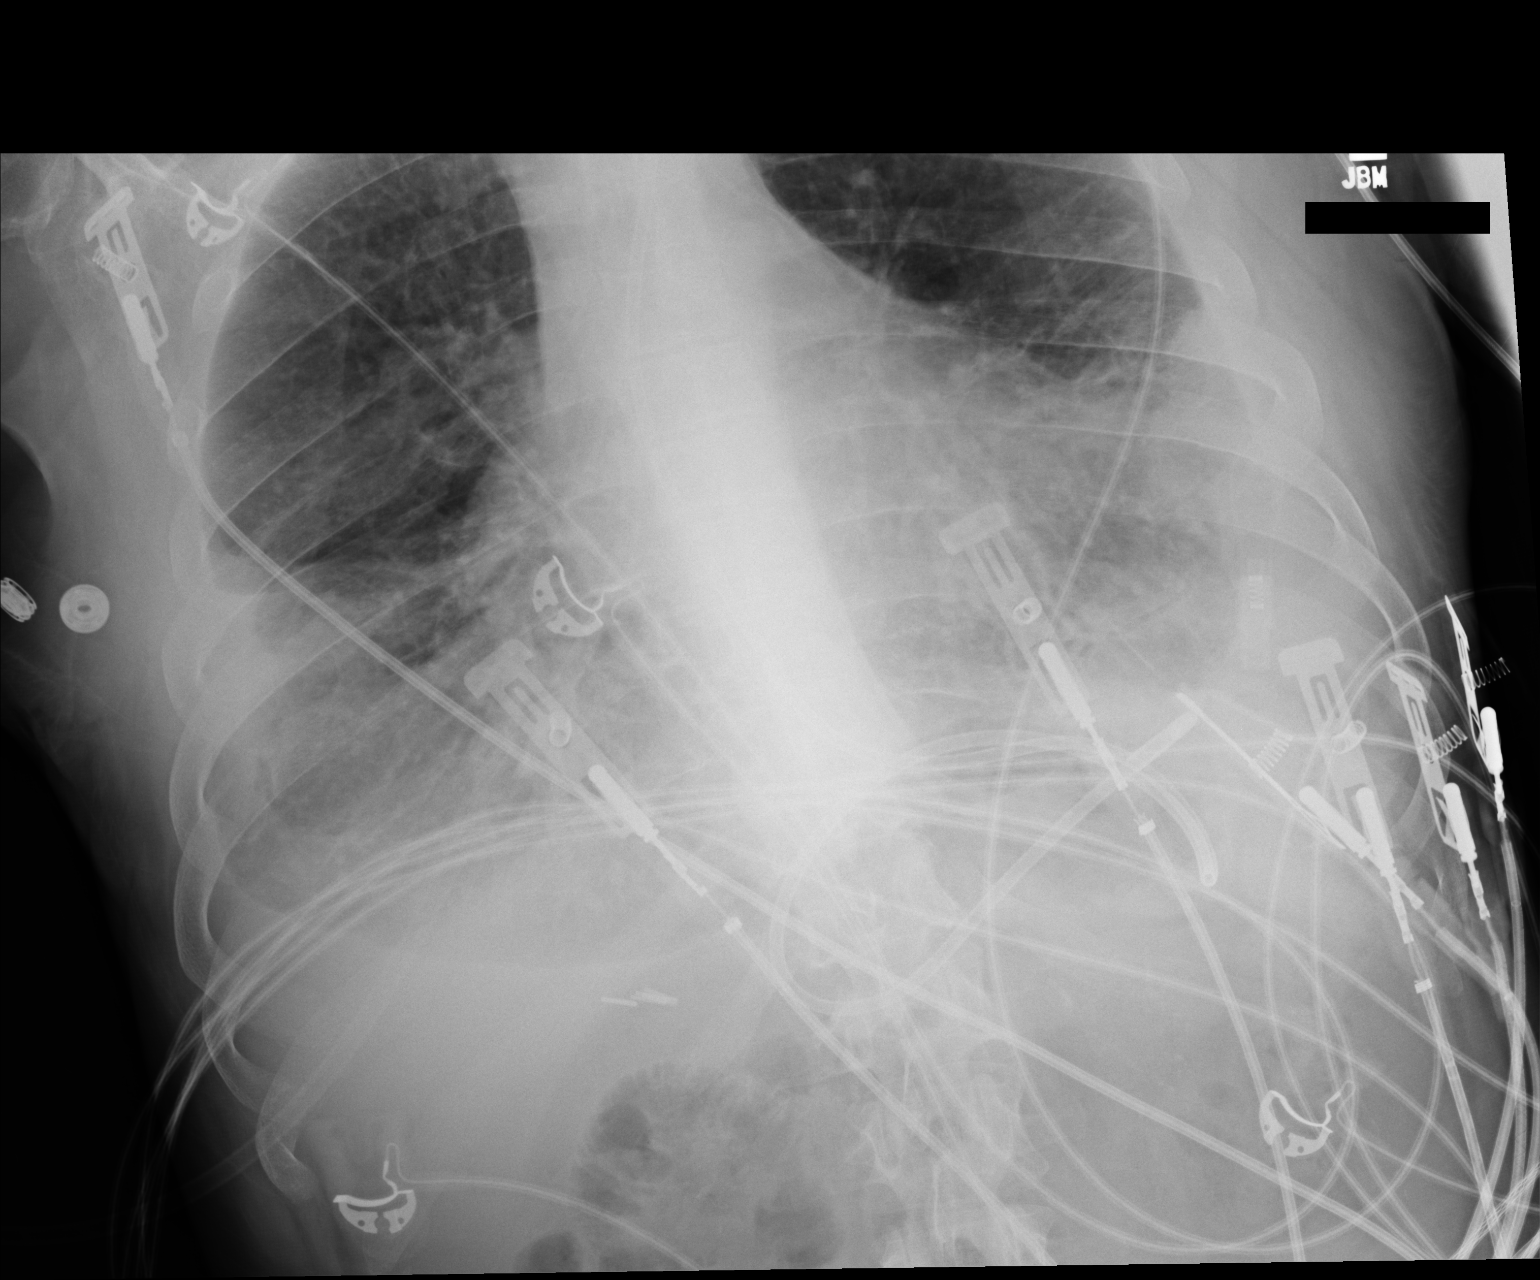

[2 of 2 positions shown; findings below may reference images not displayed]

FINDINGS: Interval tracheostomy appears well-positioned. Cardiomediastinal
silhouette is otherwise stable in appearance. Enteric tube passes
below the diaphragm.

Bibasilar opacities are unchanged, likely a combination of
atelectasis and pleural effusions.
IMPRESSION: 1. Interval tracheostomy placement, well-positioned with tip at the
level of the clavicles.
2. Otherwise stable exam. Persistent bibasilar opacities are most
likely a combination of atelectasis and pleural effusions.

## 2017-01-02 IMAGING — CR DG ABD PORTABLE 1V
1 series · 1 of 1 positions shown · non-contrast
Comparison: 02/10/2016

CLINICAL DATA: Feeding tube placement.  Diabetes.  Hypertension.

EXAM:
PORTABLE ABDOMEN - 1 VIEW

[AP]
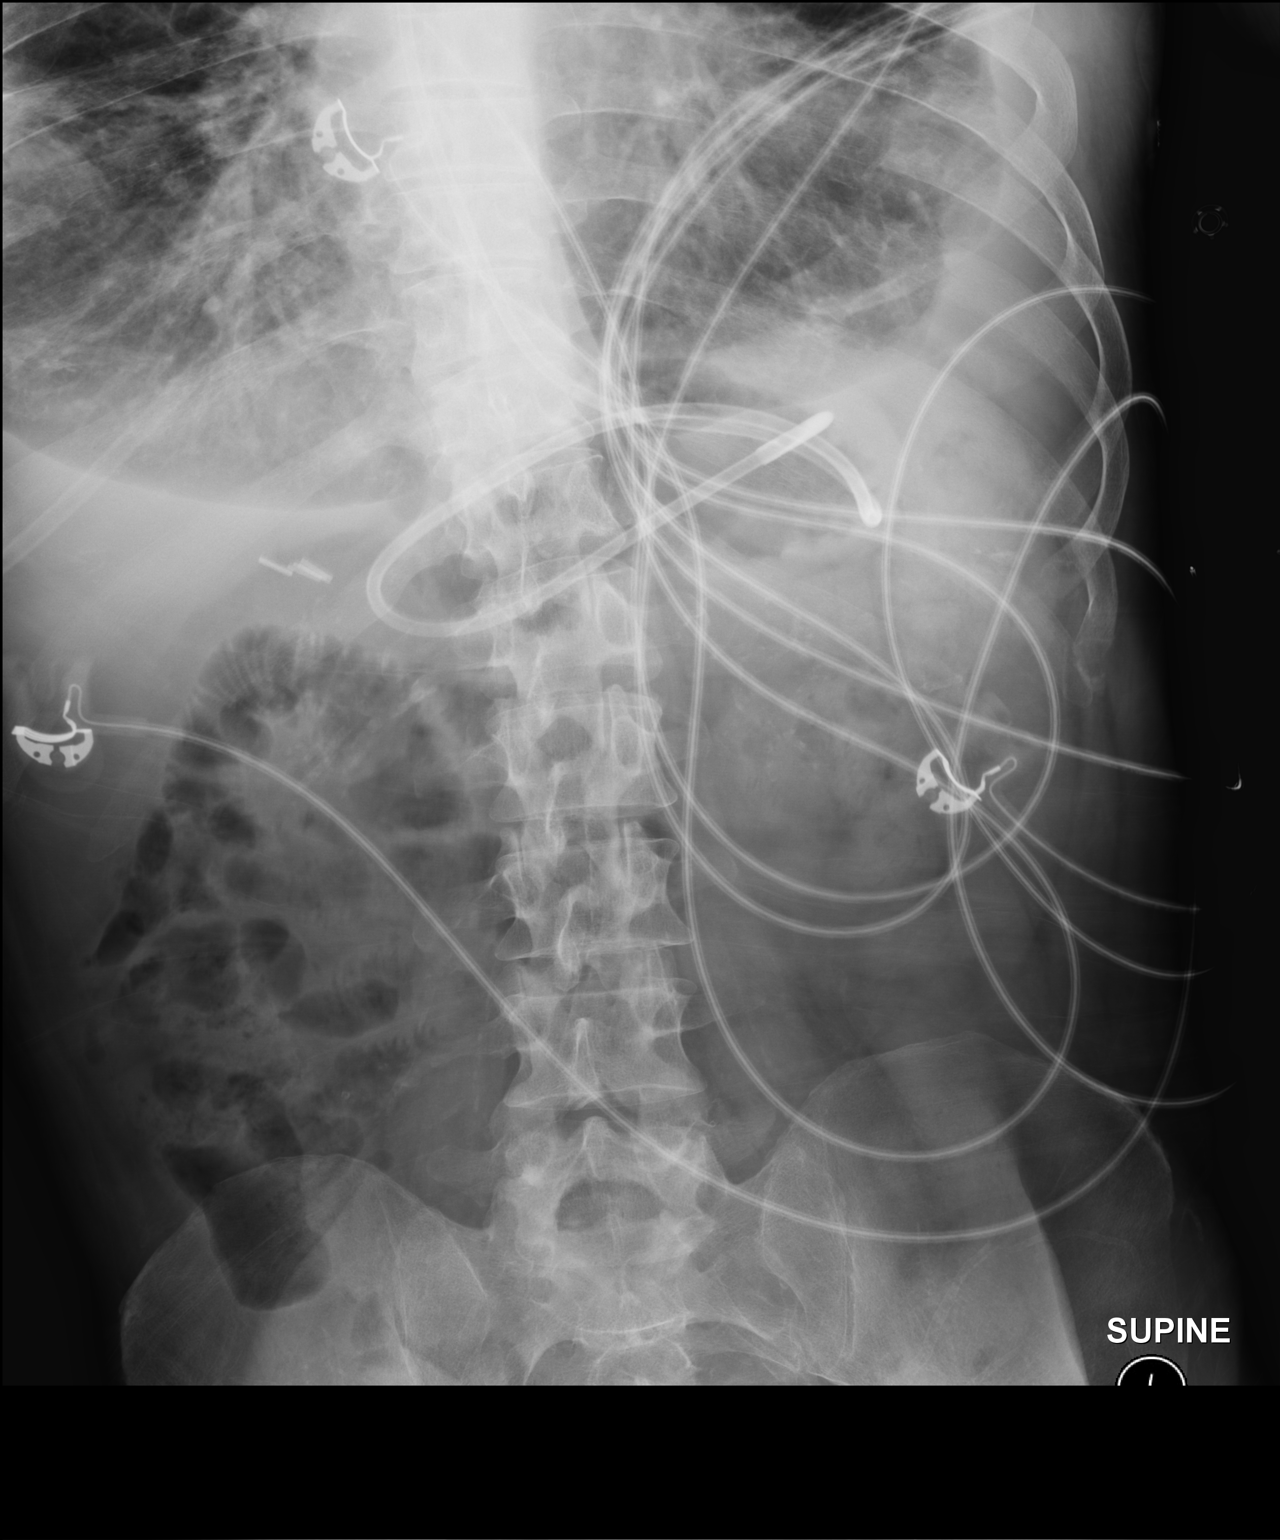

[1 of 1 positions shown; findings below may reference images not displayed]

FINDINGS: Feeding tube is coiled in the stomach, with the tip at the gastric
cardia. Right upper quadrant surgical clips. Bilateral pleural
effusions.
IMPRESSION: Feeding tube coiled in the stomach with tip at gastric cardia.

## 2017-01-03 IMAGING — CR DG ABD PORTABLE 1V
1 series · 1 of 1 positions shown · non-contrast
Comparison: 02/23/2016

CLINICAL DATA: Chcking for feeding tube placement. Pt. Has hx of
DM, and HTN.

EXAM:
PORTABLE ABDOMEN - 1 VIEW

[AP]
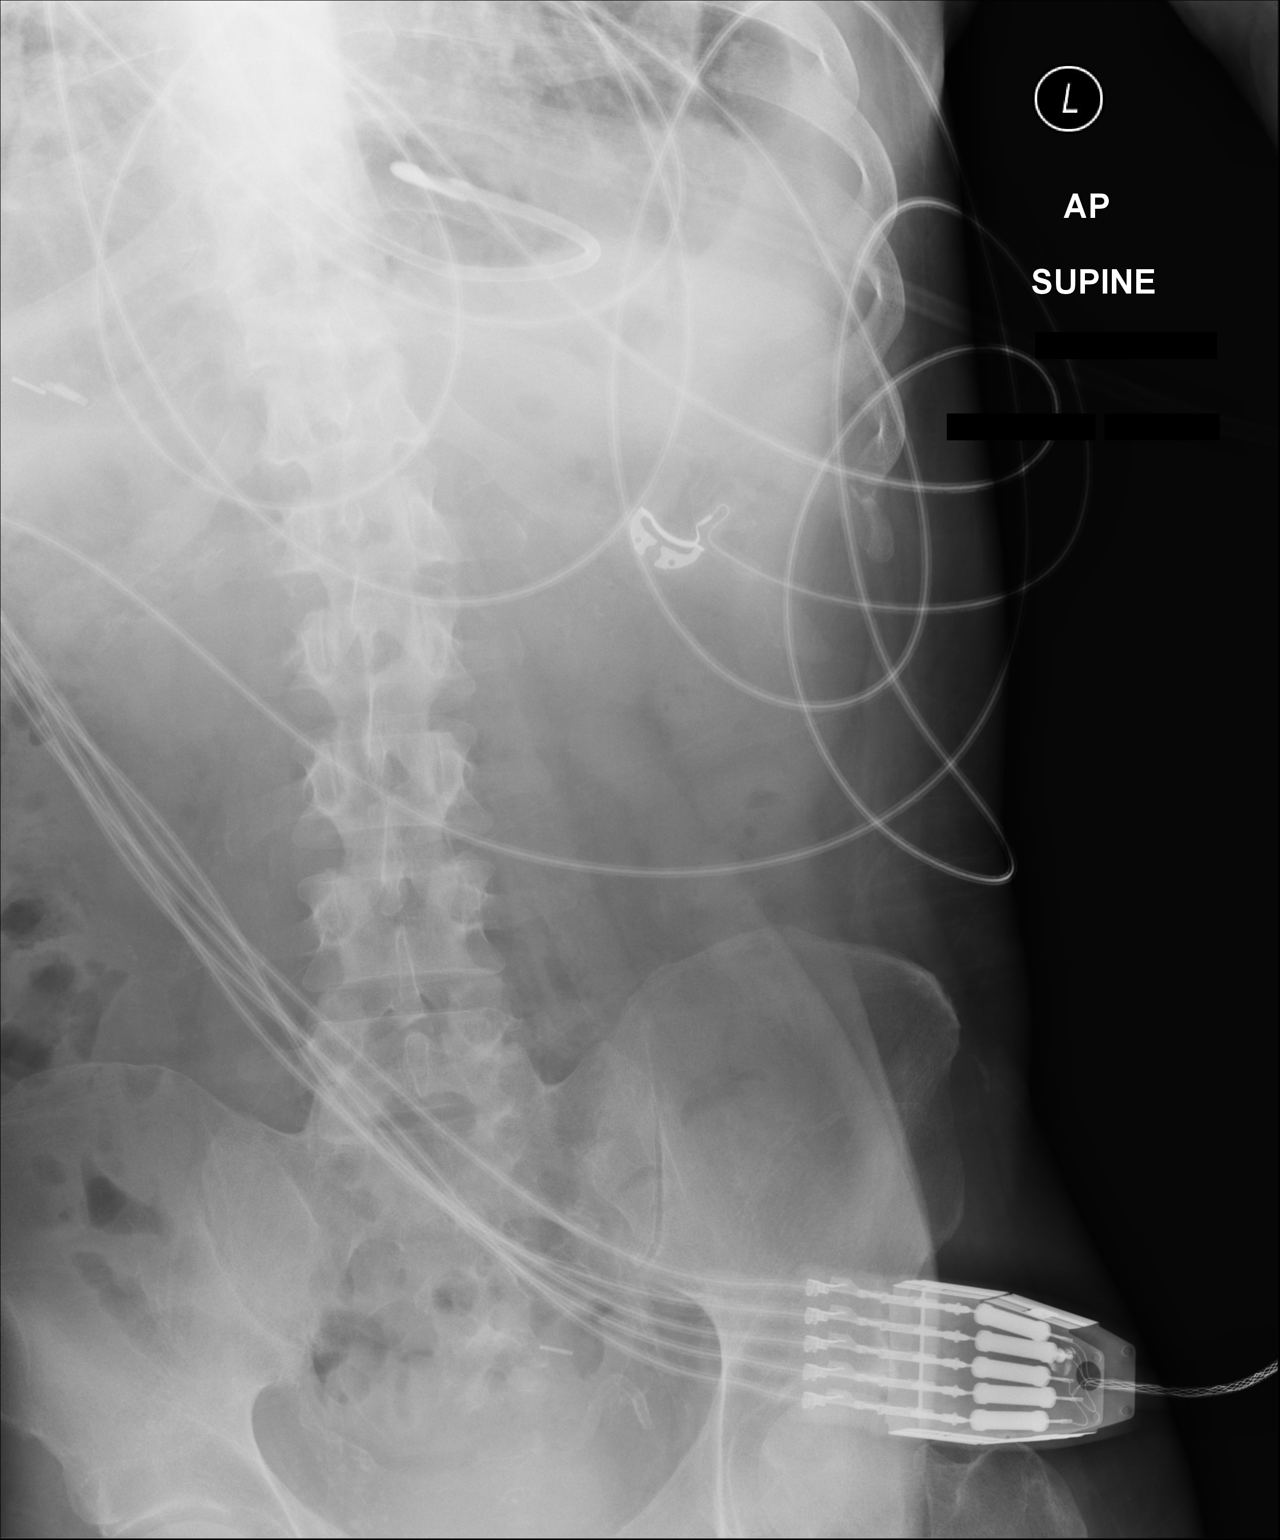

[1 of 1 positions shown; findings below may reference images not displayed]

FINDINGS: Single supine view of the left-sided abdomen and upper pelvis.
Feeding tube is looped in the stomach with tip directed cephalad at
the gastric cardia. Non-obstructive bowel gas pattern. Left pleural
effusion again identified. Cholecystectomy clips.
IMPRESSION: Feeding tube looped in the stomach with tip directed cephalad at the
gastric cardia.

## 2017-01-23 IMAGING — CR DG CHEST 1V PORT
1 series · 1 of 1 positions shown · non-contrast
Comparison: Chest radiograph performed 03/12/2016

CLINICAL DATA: Acute onset of shortness of breath. Assess
pneumonia. Initial encounter.

EXAM:
PORTABLE CHEST 1 VIEW

[AP]
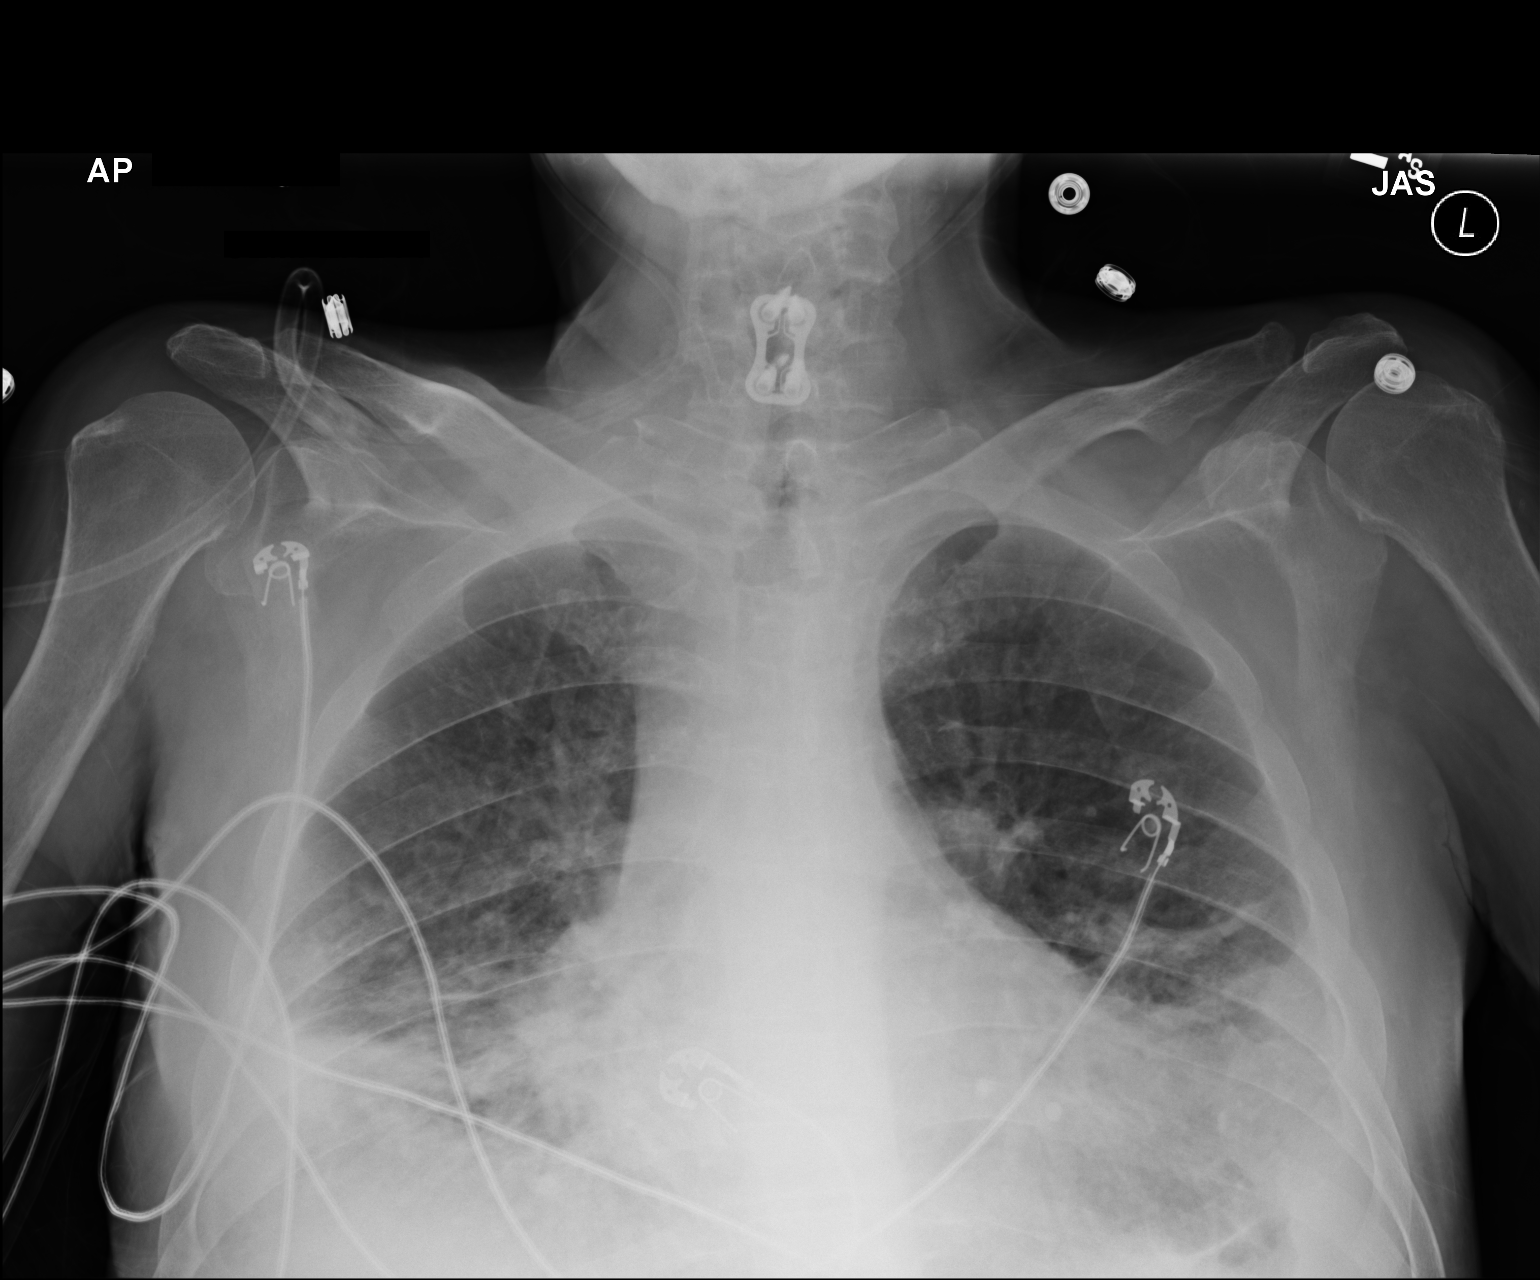

[1 of 1 positions shown; findings below may reference images not displayed]

FINDINGS: Bibasilar airspace opacities raise concern for pneumonia. Underlying
vascular congestion is noted. Small bilateral pleural effusions are
seen. No pneumothorax is identified.

The cardiomediastinal silhouette is normal in size. No acute osseous
abnormalities are identified. Cervical spinal fusion hardware is
noted.
IMPRESSION: Bibasilar airspace opacities raise concern for pneumonia. Underlying
vascular congestion noted. Small bilateral pleural effusions seen.
